# Patient Record
Sex: Female | Born: 1965 | Race: Black or African American | Hispanic: No | Marital: Single | Smoking: Former smoker
Health system: Southern US, Community
[De-identification: ages and names within clinical notes are randomized; demographics above are authoritative.]

## PROBLEM LIST (undated history)

## (undated) DIAGNOSIS — G7000 Myasthenia gravis without (acute) exacerbation: Secondary | ICD-10-CM

## (undated) DIAGNOSIS — R011 Cardiac murmur, unspecified: Secondary | ICD-10-CM

## (undated) DIAGNOSIS — F32A Depression, unspecified: Secondary | ICD-10-CM

## (undated) DIAGNOSIS — E669 Obesity, unspecified: Secondary | ICD-10-CM

## (undated) DIAGNOSIS — F329 Major depressive disorder, single episode, unspecified: Secondary | ICD-10-CM

## (undated) DIAGNOSIS — I2699 Other pulmonary embolism without acute cor pulmonale: Secondary | ICD-10-CM

## (undated) DIAGNOSIS — E079 Disorder of thyroid, unspecified: Secondary | ICD-10-CM

## (undated) DIAGNOSIS — G4730 Sleep apnea, unspecified: Secondary | ICD-10-CM

## (undated) DIAGNOSIS — J45909 Unspecified asthma, uncomplicated: Secondary | ICD-10-CM

## (undated) HISTORY — PX: THYROID SURGERY: SHX805

## (undated) HISTORY — PX: TUBAL LIGATION: SHX77

## (undated) HISTORY — PX: TONSILLECTOMY: SUR1361

---
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
## 1992-09-10 DIAGNOSIS — G7000 Myasthenia gravis without (acute) exacerbation: Secondary | ICD-10-CM | POA: Diagnosis present

## 1999-12-31 DIAGNOSIS — E039 Hypothyroidism, unspecified: Secondary | ICD-10-CM | POA: Insufficient documentation

## 2000-03-11 ENCOUNTER — Encounter: Admission: RE | Admit: 2000-03-11 | Discharge: 2000-03-11 | Payer: Self-pay | Admitting: *Deleted

## 2000-03-11 ENCOUNTER — Encounter: Payer: Self-pay | Admitting: *Deleted

## 2000-03-14 ENCOUNTER — Encounter: Payer: Self-pay | Admitting: Emergency Medicine

## 2000-03-14 ENCOUNTER — Emergency Department (HOSPITAL_COMMUNITY): Admission: EM | Admit: 2000-03-14 | Discharge: 2000-03-14 | Payer: Self-pay | Admitting: Emergency Medicine

## 2000-03-29 ENCOUNTER — Encounter: Admission: RE | Admit: 2000-03-29 | Discharge: 2000-04-12 | Payer: Self-pay | Admitting: Orthopedic Surgery

## 2000-04-21 ENCOUNTER — Encounter: Payer: Self-pay | Admitting: Emergency Medicine

## 2000-04-21 ENCOUNTER — Emergency Department (HOSPITAL_COMMUNITY): Admission: EM | Admit: 2000-04-21 | Discharge: 2000-04-21 | Payer: Self-pay | Admitting: Emergency Medicine

## 2000-08-13 ENCOUNTER — Emergency Department (HOSPITAL_COMMUNITY): Admission: EM | Admit: 2000-08-13 | Discharge: 2000-08-13 | Payer: Self-pay | Admitting: Emergency Medicine

## 2000-08-31 ENCOUNTER — Emergency Department (HOSPITAL_COMMUNITY): Admission: EM | Admit: 2000-08-31 | Discharge: 2000-08-31 | Payer: Self-pay | Admitting: Emergency Medicine

## 2002-06-22 ENCOUNTER — Other Ambulatory Visit: Admission: RE | Admit: 2002-06-22 | Discharge: 2002-06-22 | Payer: Self-pay | Admitting: Obstetrics and Gynecology

## 2003-03-30 ENCOUNTER — Emergency Department (HOSPITAL_COMMUNITY): Admission: EM | Admit: 2003-03-30 | Discharge: 2003-03-30 | Payer: Self-pay | Admitting: Emergency Medicine

## 2003-08-08 ENCOUNTER — Other Ambulatory Visit: Admission: RE | Admit: 2003-08-08 | Discharge: 2003-08-08 | Payer: Self-pay | Admitting: Obstetrics and Gynecology

## 2003-10-02 ENCOUNTER — Emergency Department (HOSPITAL_COMMUNITY): Admission: EM | Admit: 2003-10-02 | Discharge: 2003-10-02 | Payer: Self-pay | Admitting: Emergency Medicine

## 2004-06-04 ENCOUNTER — Emergency Department (HOSPITAL_COMMUNITY): Admission: EM | Admit: 2004-06-04 | Discharge: 2004-06-04 | Payer: Self-pay | Admitting: Family Medicine

## 2004-09-04 ENCOUNTER — Emergency Department (HOSPITAL_COMMUNITY): Admission: EM | Admit: 2004-09-04 | Discharge: 2004-09-04 | Payer: Self-pay | Admitting: Family Medicine

## 2004-09-06 ENCOUNTER — Emergency Department (HOSPITAL_COMMUNITY): Admission: EM | Admit: 2004-09-06 | Discharge: 2004-09-06 | Payer: Self-pay | Admitting: Emergency Medicine

## 2004-10-08 ENCOUNTER — Other Ambulatory Visit: Admission: RE | Admit: 2004-10-08 | Discharge: 2004-10-08 | Payer: Self-pay | Admitting: Obstetrics and Gynecology

## 2004-11-21 ENCOUNTER — Emergency Department (HOSPITAL_COMMUNITY): Admission: EM | Admit: 2004-11-21 | Discharge: 2004-11-21 | Payer: Self-pay | Admitting: Family Medicine

## 2004-12-18 ENCOUNTER — Emergency Department (HOSPITAL_COMMUNITY): Admission: EM | Admit: 2004-12-18 | Discharge: 2004-12-18 | Payer: Self-pay | Admitting: Emergency Medicine

## 2005-02-13 ENCOUNTER — Emergency Department (HOSPITAL_COMMUNITY): Admission: EM | Admit: 2005-02-13 | Discharge: 2005-02-13 | Payer: Self-pay | Admitting: Emergency Medicine

## 2005-10-22 ENCOUNTER — Other Ambulatory Visit: Admission: RE | Admit: 2005-10-22 | Discharge: 2005-10-22 | Payer: Self-pay | Admitting: Obstetrics and Gynecology

## 2005-12-07 ENCOUNTER — Ambulatory Visit (HOSPITAL_COMMUNITY): Admission: RE | Admit: 2005-12-07 | Discharge: 2005-12-07 | Payer: Self-pay | Admitting: Obstetrics and Gynecology

## 2005-12-07 ENCOUNTER — Encounter (INDEPENDENT_AMBULATORY_CARE_PROVIDER_SITE_OTHER): Payer: Self-pay | Admitting: *Deleted

## 2006-03-03 ENCOUNTER — Emergency Department (HOSPITAL_COMMUNITY): Admission: EM | Admit: 2006-03-03 | Discharge: 2006-03-03 | Payer: Self-pay | Admitting: Family Medicine

## 2007-06-17 ENCOUNTER — Emergency Department (HOSPITAL_COMMUNITY): Admission: EM | Admit: 2007-06-17 | Discharge: 2007-06-17 | Payer: Self-pay | Admitting: Family Medicine

## 2009-01-06 ENCOUNTER — Emergency Department (HOSPITAL_COMMUNITY): Admission: EM | Admit: 2009-01-06 | Discharge: 2009-01-06 | Payer: Self-pay | Admitting: Emergency Medicine

## 2010-06-06 NOTE — Op Note (Signed)
NAME:  [name], [name]            ACCOUNT NO.:  [id number]   MEDICAL RECORD NO.:  [id number]          PATIENT TYPE:  AMB   LOCATION:  SDC                           FACILITY:  [facility]   PHYSICIAN:  James A. Ashley Royalty, M.D.DATE OF BIRTH:  16-May-1965   DATE OF PROCEDURE:  12/07/2005  DATE OF DISCHARGE:                               OPERATIVE REPORT   PREOPERATIVE DIAGNOSES:  1. Fibroid uterus.  2. Abnormal bleeding.   POSTOPERATIVE DIAGNOSES:  1. Fibroid uterus.  2. Abnormal bleeding.  3. Pathology pending.   PROCEDURE:  1. Diagnostic hysteroscopy.  2. Dilatation & curettage.   SURGEON:  Rudy Jew. Ashley Royalty, MD   ANESTHESIA:  General.   ESTIMATED BLOOD LOSS:  Less than 50 mL.   COMPLICATIONS:  None.   PACKS AND DRAINS:  None.   PROCEDURE:  The patient was taken to the operating room and placed in a  dorsal supine position.  After general anesthetic was administered, she  was placed in lithotomy position and prepped and draped in the usual  manner for vaginal surgery.  Posterior weighted retractor was placed per  vagina.  The anterior lip of the cervix was grasped with a single-tooth  tenaculum.  The uterus was gently sounded to approximately 9 cm and  noted to be anteverted.  The cervix was then dilated to a size 27-French  using Shawnie Pons dilators.  The resectoscope was placed using sorbitol as a  distension medium.  Endometrial cavity was visualized in its entirety.  The tubal ostia were noted bilaterally.  Careful evaluation of the  cavity revealed no obvious submucosal fibroids or polyps.  Endocervical  view was similarly benign.  Appropriate photos were obtained.   Attention was then turned to the uterine curettage.  A medium size  curette was introduced into the endometrial cavity, and the 4 quadrant  technique with curettage was initiated.  Then, a therapeutic technique  was used.  All curettings were submitted to pathology for histologic  studies.   At this point, the  patient was felt to have benefited maximally from the  surgical procedure.  Instruments were removed.  Hemostasis was noted and  the procedure terminated.   The patient tolerated the procedure extremely well and was returned to  the recovery room in good condition.      James A. Ashley Royalty, M.D.  Electronically Signed     JAM/MEDQ  D:  12/07/2005  T:  12/07/2005  Job:  [id number]

## 2010-06-06 NOTE — H&P (Signed)
NAME:  [name], [name]            ACCOUNT NO.:  [id number]   MEDICAL RECORD NO.:  [id number]          PATIENT TYPE:  AMB   LOCATION:  SDC                           FACILITY:  [facility]   PHYSICIAN:  James A. Ashley Royalty, M.D.DATE OF BIRTH:  20-Mar-1965   DATE OF ADMISSION:  DATE OF DISCHARGE:                              HISTORY & PHYSICAL   Note:  Please have this dictation available in the outpatient surgical  area in [facility] at 1:00 p.m.   This is a 44 year old gravida 1, para 0, AB 1, who presented to me  October 2007 complaining of metrorrhagia, including clots and severe  dysmenorrhea.  Periods were reported as approximately every 28 days.   ASSESSMENT:  1. Ultrasound was performed in the office and revealed the uterus to      be 11 x 8 x 6 cm with numerous fibroids present.  The largest was      4.4 cm in greatest diameter.  The adnexa were essentially      unremarkable.  The patient was henceforth diagnosed with      laparoscopic hysteroscopy and dilatation and curettage.   MEDICATIONS:  1. Mestinon for myasthenia.  2. Paxil 20 mg daily.  3. Axid 150 mg b.i.d.  4. Cellcept 500 mg for myasthenia.  5. Levothyroxine 150 mcg daily.   PAST MEDICAL HISTORY:  Medical:  1. Myasthenia gravis.  2. Depression.  3. Hyperthyroidism.   SURGICAL HISTORY:  Tubal sterilization procedure 1997, tonsillectomy  1990 thyroid surgery - 2000.   ALLERGIES:  NONE.   FAMILY HISTORY:  Positive for diabetes.   SOCIAL HISTORY:  Patient is a smoker.  She denies significant alcohol  use.   REVIEW OF SYSTEMS:  Noncontributory.   PHYSICAL EXAMINATION:  GENERAL:  Well-developed, well-nourished,  pleasant black female, no acute distress, afebrile.  VITAL SIGNS:  Stable.  HEENT:  Normocephalic.  NECK:  Supple without masses.  CHEST:  Lungs are clear.  CARDIAC:  Regular rate and rhythm.  ABDOMEN:  Soft and nontender without palpable masses.  MUSCULOSKELETAL:  No CVA tenderness.  PELVIC:   Please see most recent pelvic examination.  GENITALIA:  Within normal limits.  Vagina and cervix are without masses  or lesions.  Bi-manual examination reveals a multi-nodular uterus  consistent with fibroids.  No adnexal masses palpable.   IMPRESSION:  1. Fibroid uterus.  2. Metrorrhagia and severe dysmenorrhea - probably secondary to #1.  3. Myasthenia gravis.  4. Smoker.  5. Hypothyroidism.  6. History of depression.   PLAN:  1. Diagnostic laparoscopic hysteroscopy.  2. Dilatation and curettage.   Risks, benefits, complications and alternatives were discussed with the  patient.  She states she understands and accepts.  Questions were  invited and answered.      James A. Ashley Royalty, M.D.  Electronically Signed     JAM/MEDQ  D:  12/07/2005  T:  12/07/2005  Job:  [id number]

## 2010-07-28 ENCOUNTER — Emergency Department (HOSPITAL_COMMUNITY): Payer: Medicare Other

## 2010-07-28 ENCOUNTER — Emergency Department (HOSPITAL_COMMUNITY)
Admission: EM | Admit: 2010-07-28 | Discharge: 2010-07-29 | Disposition: A | Discharge status: Home / Self Care | Payer: Medicare Other | Attending: Emergency Medicine | Admitting: Emergency Medicine

## 2010-07-28 DIAGNOSIS — R0602 Shortness of breath: Secondary | ICD-10-CM | POA: Insufficient documentation

## 2010-07-28 DIAGNOSIS — K219 Gastro-esophageal reflux disease without esophagitis: Secondary | ICD-10-CM | POA: Insufficient documentation

## 2010-07-28 DIAGNOSIS — R5383 Other fatigue: Secondary | ICD-10-CM | POA: Insufficient documentation

## 2010-07-28 DIAGNOSIS — E860 Dehydration: Secondary | ICD-10-CM | POA: Insufficient documentation

## 2010-07-28 DIAGNOSIS — R5381 Other malaise: Secondary | ICD-10-CM | POA: Insufficient documentation

## 2010-07-28 DIAGNOSIS — R42 Dizziness and giddiness: Secondary | ICD-10-CM | POA: Insufficient documentation

## 2010-07-28 DIAGNOSIS — Z79899 Other long term (current) drug therapy: Secondary | ICD-10-CM | POA: Insufficient documentation

## 2010-07-28 DIAGNOSIS — E039 Hypothyroidism, unspecified: Secondary | ICD-10-CM | POA: Insufficient documentation

## 2010-07-28 DIAGNOSIS — H519 Unspecified disorder of binocular movement: Secondary | ICD-10-CM | POA: Insufficient documentation

## 2010-07-28 DIAGNOSIS — R110 Nausea: Secondary | ICD-10-CM | POA: Insufficient documentation

## 2010-07-28 LAB — DIFFERENTIAL
Lymphocytes Relative: 30 % (ref 12–46)
Monocytes Absolute: 0.8 10*3/uL (ref 0.1–1.0)
Monocytes Relative: 7 % (ref 3–12)
Neutro Abs: 6.4 10*3/uL (ref 1.7–7.7)

## 2010-07-28 LAB — CBC
HCT: 36.4 % (ref 36.0–46.0)
Hemoglobin: 12 g/dL (ref 12.0–15.0)
MCH: 26.7 pg (ref 26.0–34.0)
MCHC: 33 g/dL (ref 30.0–36.0)

## 2010-07-29 ENCOUNTER — Ambulatory Visit (HOSPITAL_COMMUNITY)
Admission: RE | Admit: 2010-07-29 | Discharge: 2010-07-29 | Disposition: A | Discharge status: Home / Self Care | Payer: Medicare Other | Source: Ambulatory Visit | Attending: Emergency Medicine | Admitting: Emergency Medicine

## 2010-07-29 DIAGNOSIS — R0602 Shortness of breath: Secondary | ICD-10-CM

## 2010-07-29 LAB — BASIC METABOLIC PANEL
Chloride: 102 mEq/L (ref 96–112)
Creatinine, Ser: 0.54 mg/dL (ref 0.50–1.10)
GFR calc Af Amer: >60 mL/min (ref >60)
Potassium: 4.2 mEq/L (ref 3.5–5.1)

## 2010-07-29 LAB — URINALYSIS, ROUTINE W REFLEX MICROSCOPIC
Ketones, ur: NEGATIVE mg/dL
Leukocytes, UA: NEGATIVE
Nitrite: NEGATIVE
Specific Gravity, Urine: 1.016 (ref 1.005–1.030)
pH: 5.5 (ref 5.0–8.0)

## 2010-07-29 LAB — PRO B NATRIURETIC PEPTIDE: Pro B Natriuretic peptide (BNP): 25.2 pg/mL (ref 0–125)

## 2010-07-29 MED ORDER — IOHEXOL 300 MG/ML  SOLN
100.0000 mL | Freq: Once | INTRAMUSCULAR | Status: AC | PRN
  Administered 2010-07-29: 100 mL via INTRAVENOUS

## 2010-08-04 LAB — GLUCOSE, CAPILLARY: Glucose-Capillary: 122 mg/dL — ABNORMAL HIGH (ref 70–99)

## 2010-10-15 LAB — HERPES SIMPLEX VIRUS CULTURE

## 2010-10-19 ENCOUNTER — Inpatient Hospital Stay (INDEPENDENT_AMBULATORY_CARE_PROVIDER_SITE_OTHER)
Admission: RE | Admit: 2010-10-19 | Discharge: 2010-10-19 | Disposition: A | Discharge status: Home / Self Care | Payer: Medicaid Other | Source: Ambulatory Visit | Attending: Emergency Medicine | Admitting: Emergency Medicine

## 2010-10-19 DIAGNOSIS — J45909 Unspecified asthma, uncomplicated: Secondary | ICD-10-CM

## 2011-08-05 ENCOUNTER — Emergency Department (HOSPITAL_COMMUNITY)
Admission: EM | Admit: 2011-08-05 | Discharge: 2011-08-05 | Disposition: A | Discharge status: Home / Self Care | Payer: Medicare Other | Attending: Emergency Medicine | Admitting: Emergency Medicine

## 2011-08-05 ENCOUNTER — Encounter (HOSPITAL_COMMUNITY): Payer: Self-pay | Admitting: *Deleted

## 2011-08-05 ENCOUNTER — Emergency Department (HOSPITAL_COMMUNITY): Payer: Medicare Other

## 2011-08-05 DIAGNOSIS — R071 Chest pain on breathing: Secondary | ICD-10-CM | POA: Insufficient documentation

## 2011-08-05 DIAGNOSIS — J45909 Unspecified asthma, uncomplicated: Secondary | ICD-10-CM | POA: Insufficient documentation

## 2011-08-05 DIAGNOSIS — R0789 Other chest pain: Secondary | ICD-10-CM

## 2011-08-05 DIAGNOSIS — F17200 Nicotine dependence, unspecified, uncomplicated: Secondary | ICD-10-CM | POA: Insufficient documentation

## 2011-08-05 DIAGNOSIS — J40 Bronchitis, not specified as acute or chronic: Secondary | ICD-10-CM | POA: Insufficient documentation

## 2011-08-05 HISTORY — DX: Unspecified asthma, uncomplicated: J45.909

## 2011-08-05 HISTORY — DX: Myasthenia gravis without (acute) exacerbation: G70.00

## 2011-08-05 MED ORDER — IBUPROFEN 800 MG PO TABS
800.0000 mg | ORAL_TABLET | Freq: Once | ORAL | Status: AC
  Administered 2011-08-05: 800 mg via ORAL
  Filled 2011-08-05: qty 1

## 2011-08-05 MED ORDER — IBUPROFEN 800 MG PO TABS: 800.0000 mg | ORAL_TABLET | Freq: Three times a day (TID) | ORAL | Status: DC

## 2011-08-05 MED ORDER — ALBUTEROL SULFATE HFA 108 (90 BASE) MCG/ACT IN AERS: 2.0000 | INHALATION_SPRAY | RESPIRATORY_TRACT | Status: DC | PRN

## 2011-08-05 MED ORDER — HYDROCOD POLST-CHLORPHEN POLST 10-8 MG/5ML PO LQCR: 5.0000 mL | Freq: Two times a day (BID) | ORAL | Status: DC | PRN

## 2011-08-05 MED ORDER — ALBUTEROL SULFATE (5 MG/ML) 0.5% IN NEBU
2.5000 mg | INHALATION_SOLUTION | Freq: Once | RESPIRATORY_TRACT | Status: AC
  Administered 2011-08-05: 2.5 mg via RESPIRATORY_TRACT
  Filled 2011-08-05: qty 1

## 2011-08-05 MED ORDER — IBUPROFEN 800 MG PO TABS: 800.0000 mg | ORAL_TABLET | Freq: Three times a day (TID) | ORAL | Status: AC

## 2011-08-05 MED ORDER — HYDROCOD POLST-CHLORPHEN POLST 10-8 MG/5ML PO LQCR
5.0000 mL | Freq: Once | ORAL | Status: AC
  Administered 2011-08-05: 5 mL via ORAL
  Filled 2011-08-05: qty 5

## 2011-08-05 NOTE — ED Notes (Signed)
The pt has had mi and rt upper chest pain since yesterday with sob.  No previous history

## 2011-08-05 NOTE — ED Notes (Signed)
Pt discharged home in improved condition with friend.

## 2011-08-05 NOTE — ED Provider Notes (Signed)
History     CSN: [id number]  Arrival date & time 08/05/11  4098   First MD Initiated Contact with Patient 08/05/11 0325      Chief Complaint  Patient presents with  . Chest Pain    (Consider location/radiation/quality/duration/timing/severity/associated sxs/prior treatment) HPI 46 year old female presents to emergency department complaining of chest pain cough and increased sputum production. Patient reports onset of cough Sunday. Cough is caused worsening pain in her chest. Patient reports at times she has coughed until she is gag and throw not. Patient has history of asthma, myasthenia gravis, morbid obesity.  Pt smokes 1ppd.  No prior h/o COPD/bronchitis/emphysema.  Patient reports chest pain is worse with palpation, deep breaths, or coughing. She denies any leg swelling. She denies any use of OCPs, prolonged immobility, or surgical procedures. No prior history of PE or DVT in herself or family. Patient denies any cardiac history in herself or family. No DOE, no PND.   Past Medical History  Diagnosis Date  . Asthma   . Myasthenia gravis     History reviewed. No pertinent past surgical history.  No family history on file.  History  Substance Use Topics  . Smoking status: Current Everyday Smoker  . Smokeless tobacco: Not on file  . Alcohol Use: Yes    OB History    Grav Para Term Preterm Abortions TAB SAB Ect Mult Living                  Review of Systems  All other systems reviewed and are negative.    Allergies  Review of patient's allergies indicates not on file.  Home Medications  No current outpatient prescriptions on file.  BP 127/80  Pulse 93  Temp 99.3 F (37.4 C) (Oral)  Resp 22  SpO2 97%  Physical Exam  Nursing note and vitals reviewed. Constitutional: She is oriented to person, place, and time. She appears well-developed and well-nourished.       Morbid obesity noted  HENT:  Head: Normocephalic and atraumatic.  Nose: Nose normal.    Mouth/Throat: Oropharynx is clear and moist.  Neck: Normal range of motion. Neck supple. No JVD present. No tracheal deviation present. No thyromegaly present.  Cardiovascular: Normal rate, regular rhythm, normal heart sounds and intact distal pulses.  Exam reveals no gallop and no friction rub.   No murmur heard. Pulmonary/Chest: Effort normal. No stridor. No respiratory distress. She has wheezes (faint end expiratory wheeze). She has no rales. She exhibits tenderness (tenderness to palpation over her sternum and left anterior chest. Palpation of chest reproduces pain).       Patient with persistent cough  Abdominal: Soft. Bowel sounds are normal. She exhibits no distension and no mass. There is no tenderness. There is no rebound and no guarding.  Musculoskeletal: Normal range of motion. She exhibits no edema and no tenderness.  Lymphadenopathy:    She has no cervical adenopathy.  Neurological: She is oriented to person, place, and time. She exhibits normal muscle tone. Coordination normal.  Skin: Skin is dry. No rash noted. No erythema. No pallor.  Psychiatric: She has a normal mood and affect. Her behavior is normal. Judgment and thought content normal.    ED Course  Procedures (including critical care time)  Labs Reviewed - No data to display Dg Chest 2 View  08/05/2011  *RADIOLOGY REPORT*  Clinical Data: Chest pain.  CHEST - 2 VIEW  Comparison: 07/28/2010  Findings: The stable appearance of postoperative changes in the mediastinum.  Shallow inspiration.  Mild cardiac enlargement and pulmonary vascular congestion.  No edema or consolidation.  Linear fibrosis in the left lung base is stable.  No blunting of costophrenic angles.  No pneumothorax.  No significant change since previous study.  IMPRESSION: Mild cardiac enlargement and pulmonary vascular congestion similar previous study.  Original Report Authenticated By: Marlon Pel, M.D.    Date: 08/05/2011  Rate: 86  Rhythm: normal  sinus rhythm  QRS Axis: normal  Intervals: normal  ST/T Wave abnormalities: normal  Conduction Disutrbances:none  Narrative Interpretation:   Old EKG Reviewed: unchanged     1. Chest wall pain   2. Bronchitis       MDM  46 year old female with cough and chest wall pain. EKG is normal, chest x-ray without signs of infiltrate, patient PERC negative.  Of note, triage note reports patient has had MI and right upper chest pain since yesterday with shortness of breath. Patient does not have history of MI, and has left upper chest pain. Suspect bronchitis with chest wall pain secondary to coughing. Patient has a primary care Dr. Stann Mainland give breathing treatment here start on cough suppressants and have her followup with her primary care Dr. No steroids at this time given history of myasthenia gravis.        Olivia Mackie, MD 08/05/11 [phone]

## 2012-05-22 ENCOUNTER — Inpatient Hospital Stay (HOSPITAL_COMMUNITY)
Admission: AD | Admit: 2012-05-22 | Discharge: 2012-05-22 | Disposition: A | Discharge status: Home / Self Care | Payer: Medicare Other | Source: Ambulatory Visit | Attending: Obstetrics and Gynecology | Admitting: Obstetrics and Gynecology

## 2012-05-22 DIAGNOSIS — R109 Unspecified abdominal pain: Secondary | ICD-10-CM | POA: Insufficient documentation

## 2012-05-22 DIAGNOSIS — K5289 Other specified noninfective gastroenteritis and colitis: Secondary | ICD-10-CM | POA: Insufficient documentation

## 2012-05-22 DIAGNOSIS — R197 Diarrhea, unspecified: Secondary | ICD-10-CM | POA: Insufficient documentation

## 2012-05-22 DIAGNOSIS — R112 Nausea with vomiting, unspecified: Secondary | ICD-10-CM | POA: Insufficient documentation

## 2012-05-22 DIAGNOSIS — K921 Melena: Secondary | ICD-10-CM

## 2012-05-22 DIAGNOSIS — K529 Noninfective gastroenteritis and colitis, unspecified: Secondary | ICD-10-CM

## 2012-05-22 LAB — CBC
HCT: 38.8 % (ref 36.0–46.0)
MCH: 28.2 pg (ref 26.0–34.0)
MCHC: 32.2 g/dL (ref 30.0–36.0)
MCV: 87.6 fL (ref 78.0–100.0)
RDW: 15.6 % — ABNORMAL HIGH (ref 11.5–15.5)
WBC: 9.9 10*3/uL (ref 4.0–10.5)

## 2012-05-22 LAB — URINALYSIS, ROUTINE W REFLEX MICROSCOPIC
Bilirubin Urine: NEGATIVE
Leukocytes, UA: NEGATIVE
Nitrite: NEGATIVE
Specific Gravity, Urine: >1.03 — ABNORMAL HIGH (ref 1.005–1.030)
pH: 5.5 (ref 5.0–8.0)

## 2012-05-22 LAB — URINE MICROSCOPIC-ADD ON

## 2012-05-22 LAB — POCT PREGNANCY, URINE: Preg Test, Ur: NEGATIVE

## 2012-05-22 MED ORDER — ONDANSETRON 8 MG PO TBDP
8.0000 mg | ORAL_TABLET | Freq: Once | ORAL | Status: AC
  Administered 2012-05-22: 8 mg via ORAL
  Filled 2012-05-22: qty 1

## 2012-05-22 MED ORDER — PROMETHAZINE HCL 25 MG PO TABS: 25.0000 mg | ORAL_TABLET | Freq: Four times a day (QID) | ORAL | Status: DC | PRN

## 2012-05-22 MED ORDER — DIPHENOXYLATE-ATROPINE 2.5-0.025 MG PO TABS: 1.0000 | ORAL_TABLET | Freq: Four times a day (QID) | ORAL | Status: DC | PRN

## 2012-05-22 MED ORDER — SIMETHICONE 80 MG PO CHEW
160.0000 mg | CHEWABLE_TABLET | Freq: Once | ORAL | Status: AC
  Administered 2012-05-22: 160 mg via ORAL
  Filled 2012-05-22: qty 2

## 2012-05-22 NOTE — MAU Note (Addendum)
Patient presents to MAU with c/o rectal bleeding with BMs x 3 days. Reports about 4 occurences of diarrhea per day since that time.  Denies changes in diet or medications; denies antecedent sexual activity or trauma. Denies cocaine use or ETOH use. Reports having taken one tablet of Aleve yesterday.  Reports hx of myasthenia gravida, hypothyroidism.

## 2012-05-22 NOTE — MAU Note (Signed)
Pt reports she has been having abd cramping and having blood in he stool x 3 days.

## 2012-05-22 NOTE — MAU Provider Note (Signed)
History     CSN: [id number]  Arrival date and time: 05/22/12 1111   First Provider Initiated Contact with Patient 05/22/12 1210      Chief Complaint  Patient presents with  . Rectal Bleeding   HPI 47 y.o. female with n/v/d x 3 days, blood in stool since onset of diarrhea. No fever. + abdominal pain.   Past Medical History  Diagnosis Date  . Asthma   . Myasthenia gravis     No past surgical history on file.  No family history on file.  History  Substance Use Topics  . Smoking status: Current Every Day Smoker  . Smokeless tobacco: Not on file  . Alcohol Use: Yes    Allergies: No Known Allergies  Prescriptions prior to admission  Medication Sig Dispense Refill  . albuterol (PROVENTIL HFA;VENTOLIN HFA) 108 (90 BASE) MCG/ACT inhaler Inhale 2 puffs into the lungs every 6 (six) hours as needed.      Marland Kitchen levothyroxine (SYNTHROID, LEVOTHROID) 200 MCG tablet Take 200 mcg by mouth daily before breakfast.      . PARoxetine (PAXIL) 20 MG tablet Take 20 mg by mouth daily.      Marland Kitchen PARoxetine (PAXIL) 30 MG tablet Take 30 mg by mouth daily.        Review of Systems  Constitutional: Negative.  Negative for fever and chills.  Respiratory: Negative.   Cardiovascular: Negative.   Gastrointestinal: Positive for nausea, vomiting, abdominal pain, diarrhea and blood in stool. Negative for constipation.  Genitourinary: Negative for dysuria, urgency, frequency, hematuria and flank pain.       Negative for vaginal bleeding, vaginal discharge  Musculoskeletal: Negative.   Neurological: Negative.   Psychiatric/Behavioral: Negative.    Physical Exam   Blood pressure 135/67, pulse 79, temperature 98.3 F (36.8 C), temperature source Oral, resp. rate 18, height 5' 0.5" (1.537 m), weight 305 lb 6.4 oz (138.529 kg).  Physical Exam  Nursing note and vitals reviewed. Constitutional: She is oriented to person, place, and time. She appears well-developed and well-nourished. She appears distressed.   Cardiovascular: Normal rate.   Respiratory: Effort normal.  GI: Soft. There is no tenderness.  Genitourinary: Rectal exam shows external hemorrhoid. Rectal exam shows no internal hemorrhoid, no fissure, no mass, no tenderness and anal tone normal.  Musculoskeletal: Normal range of motion.  Neurological: She is alert and oriented to person, place, and time.  Skin: Skin is warm and dry.  Psychiatric: She has a normal mood and affect.    MAU Course  Procedures  Results for orders placed during the hospital encounter of 05/22/12 (from the past 24 hour(s))  URINALYSIS, ROUTINE W REFLEX MICROSCOPIC     Status: Abnormal   Collection Time    05/22/12 11:27 AM      Result Value Range   Color, Urine YELLOW  YELLOW   APPearance CLEAR  CLEAR   Specific Gravity, Urine >1.030 (*) 1.005 - 1.030   pH 5.5  5.0 - 8.0   Glucose, UA NEGATIVE  NEGATIVE mg/dL   Hgb urine dipstick NEGATIVE  NEGATIVE   Bilirubin Urine NEGATIVE  NEGATIVE   Ketones, ur NEGATIVE  NEGATIVE mg/dL   Protein, ur 30 (*) NEGATIVE mg/dL   Urobilinogen, UA 0.2  0.0 - 1.0 mg/dL   Nitrite NEGATIVE  NEGATIVE   Leukocytes, UA NEGATIVE  NEGATIVE  URINE MICROSCOPIC-ADD ON     Status: Abnormal   Collection Time    05/22/12 11:27 AM      Result Value Range  Squamous Epithelial / LPF FEW (*) RARE   WBC, UA 0-2  <3 WBC/hpf   RBC / HPF 0-2  <3 RBC/hpf   Bacteria, UA FEW (*) RARE  POCT PREGNANCY, URINE     Status: None   Collection Time    05/22/12 11:32 AM      Result Value Range   Preg Test, Ur NEGATIVE  NEGATIVE  CBC     Status: Abnormal   Collection Time    05/22/12 12:08 PM      Result Value Range   WBC 9.9  4.0 - 10.5 K/uL   RBC 4.43  3.87 - 5.11 MIL/uL   Hemoglobin 12.5  12.0 - 15.0 g/dL   HCT 40.9  81.1 - 91.4 %   MCV 87.6  78.0 - 100.0 fL   MCH 28.2  26.0 - 34.0 pg   MCHC 32.2  30.0 - 36.0 g/dL   RDW 78.2 (*) 95.6 - 21.3 %   Platelets 199  150 - 400 K/uL   Zofran ODT 8 mg and Simethicone 160 mg given in MAU  with relief of abdominal pain and nausea.   Assessment and Plan   1. Acute gastroenteritis   2. Blood in stool   Pt appears stable with no active bleeding at this time, pain and nausea relieved with Zofran and Simethicone. Pt to follow up with PCP this week or go to [facility] if symptoms worsen.     Medication List    TAKE these medications       albuterol 108 (90 BASE) MCG/ACT inhaler  Commonly known as:  PROVENTIL HFA;VENTOLIN HFA  Inhale 2 puffs into the lungs every 6 (six) hours as needed.     diphenoxylate-atropine 2.5-0.025 MG per tablet  Commonly known as:  LOMOTIL  Take 1 tablet by mouth 4 (four) times daily as needed for diarrhea or loose stools.     levothyroxine 200 MCG tablet  Commonly known as:  SYNTHROID, LEVOTHROID  Take 200 mcg by mouth daily before breakfast.     PARoxetine 20 MG tablet  Commonly known as:  PAXIL  Take 20 mg by mouth daily.     PARoxetine 30 MG tablet  Commonly known as:  PAXIL  Take 30 mg by mouth daily.     promethazine 25 MG tablet  Commonly known as:  PHENERGAN  Take 1 tablet (25 mg total) by mouth every 6 (six) hours as needed for nausea.            Follow-up Information   Follow up with your doctor. Call on 05/23/2012.        FRAZIER,NATALIE 05/22/2012, 1:35 PM

## 2012-05-22 NOTE — MAU Provider Note (Signed)
Attestation of Attending Supervision of Advanced Practitioner: Evaluation and management procedures were performed by the PA/NP/CNM/OB Fellow under my supervision/collaboration. Chart reviewed and agree with management and plan.  Rylyn Ranganathan V 05/22/2012 4:12 PM    

## 2012-12-28 DIAGNOSIS — K219 Gastro-esophageal reflux disease without esophagitis: Secondary | ICD-10-CM | POA: Diagnosis present

## 2014-06-20 DIAGNOSIS — F419 Anxiety disorder, unspecified: Secondary | ICD-10-CM | POA: Diagnosis present

## 2014-06-20 DIAGNOSIS — F32A Depression, unspecified: Secondary | ICD-10-CM | POA: Diagnosis present

## 2014-06-20 DIAGNOSIS — F329 Major depressive disorder, single episode, unspecified: Secondary | ICD-10-CM | POA: Diagnosis present

## 2014-06-20 DIAGNOSIS — G4730 Sleep apnea, unspecified: Secondary | ICD-10-CM | POA: Diagnosis present

## 2016-08-15 DIAGNOSIS — F329 Major depressive disorder, single episode, unspecified: Secondary | ICD-10-CM | POA: Diagnosis present

## 2016-08-15 DIAGNOSIS — I2699 Other pulmonary embolism without acute cor pulmonale: Secondary | ICD-10-CM | POA: Diagnosis present

## 2016-08-15 DIAGNOSIS — R197 Diarrhea, unspecified: Secondary | ICD-10-CM | POA: Diagnosis not present

## 2016-08-15 DIAGNOSIS — E890 Postprocedural hypothyroidism: Secondary | ICD-10-CM | POA: Diagnosis present

## 2016-08-15 DIAGNOSIS — F419 Anxiety disorder, unspecified: Secondary | ICD-10-CM | POA: Diagnosis present

## 2016-08-15 DIAGNOSIS — G4733 Obstructive sleep apnea (adult) (pediatric): Secondary | ICD-10-CM | POA: Diagnosis present

## 2016-08-15 DIAGNOSIS — J4541 Moderate persistent asthma with (acute) exacerbation: Principal | ICD-10-CM | POA: Diagnosis present

## 2016-08-15 DIAGNOSIS — Z6841 Body Mass Index (BMI) 40.0 and over, adult: Secondary | ICD-10-CM

## 2016-08-15 DIAGNOSIS — K219 Gastro-esophageal reflux disease without esophagitis: Secondary | ICD-10-CM | POA: Diagnosis present

## 2016-08-15 DIAGNOSIS — I351 Nonrheumatic aortic (valve) insufficiency: Secondary | ICD-10-CM | POA: Diagnosis present

## 2016-08-15 DIAGNOSIS — G7000 Myasthenia gravis without (acute) exacerbation: Secondary | ICD-10-CM | POA: Diagnosis present

## 2016-08-15 DIAGNOSIS — R0602 Shortness of breath: Secondary | ICD-10-CM | POA: Diagnosis not present

## 2016-08-15 DIAGNOSIS — J9601 Acute respiratory failure with hypoxia: Secondary | ICD-10-CM | POA: Diagnosis present

## 2016-08-15 DIAGNOSIS — I5031 Acute diastolic (congestive) heart failure: Secondary | ICD-10-CM | POA: Diagnosis present

## 2016-08-15 DIAGNOSIS — I110 Hypertensive heart disease with heart failure: Secondary | ICD-10-CM | POA: Diagnosis present

## 2016-08-16 ENCOUNTER — Encounter (HOSPITAL_COMMUNITY): Payer: Self-pay | Admitting: Emergency Medicine

## 2016-08-16 ENCOUNTER — Inpatient Hospital Stay (HOSPITAL_COMMUNITY)
Admission: EM | Admit: 2016-08-16 | Discharge: 2016-08-19 | DRG: 202 | Disposition: A | Discharge status: Home / Self Care | Payer: Medicare Other | Attending: Internal Medicine | Admitting: Internal Medicine

## 2016-08-16 ENCOUNTER — Emergency Department (HOSPITAL_COMMUNITY): Payer: Medicare Other

## 2016-08-16 ENCOUNTER — Inpatient Hospital Stay (HOSPITAL_COMMUNITY): Payer: Medicare Other

## 2016-08-16 DIAGNOSIS — G7000 Myasthenia gravis without (acute) exacerbation: Secondary | ICD-10-CM | POA: Diagnosis present

## 2016-08-16 DIAGNOSIS — E890 Postprocedural hypothyroidism: Secondary | ICD-10-CM | POA: Diagnosis present

## 2016-08-16 DIAGNOSIS — J45901 Unspecified asthma with (acute) exacerbation: Secondary | ICD-10-CM

## 2016-08-16 DIAGNOSIS — F419 Anxiety disorder, unspecified: Secondary | ICD-10-CM | POA: Diagnosis present

## 2016-08-16 DIAGNOSIS — G4730 Sleep apnea, unspecified: Secondary | ICD-10-CM | POA: Diagnosis not present

## 2016-08-16 DIAGNOSIS — R197 Diarrhea, unspecified: Secondary | ICD-10-CM | POA: Diagnosis not present

## 2016-08-16 DIAGNOSIS — J9601 Acute respiratory failure with hypoxia: Secondary | ICD-10-CM | POA: Diagnosis present

## 2016-08-16 DIAGNOSIS — J4541 Moderate persistent asthma with (acute) exacerbation: Secondary | ICD-10-CM | POA: Diagnosis present

## 2016-08-16 DIAGNOSIS — F329 Major depressive disorder, single episode, unspecified: Secondary | ICD-10-CM | POA: Diagnosis present

## 2016-08-16 DIAGNOSIS — I2699 Other pulmonary embolism without acute cor pulmonale: Secondary | ICD-10-CM | POA: Diagnosis present

## 2016-08-16 DIAGNOSIS — F32A Depression, unspecified: Secondary | ICD-10-CM | POA: Diagnosis present

## 2016-08-16 DIAGNOSIS — K219 Gastro-esophageal reflux disease without esophagitis: Secondary | ICD-10-CM | POA: Diagnosis present

## 2016-08-16 DIAGNOSIS — M7989 Other specified soft tissue disorders: Secondary | ICD-10-CM

## 2016-08-16 DIAGNOSIS — I110 Hypertensive heart disease with heart failure: Secondary | ICD-10-CM | POA: Diagnosis present

## 2016-08-16 DIAGNOSIS — R0602 Shortness of breath: Secondary | ICD-10-CM

## 2016-08-16 DIAGNOSIS — G4733 Obstructive sleep apnea (adult) (pediatric): Secondary | ICD-10-CM | POA: Diagnosis present

## 2016-08-16 DIAGNOSIS — Z6841 Body Mass Index (BMI) 40.0 and over, adult: Secondary | ICD-10-CM | POA: Diagnosis not present

## 2016-08-16 DIAGNOSIS — I5031 Acute diastolic (congestive) heart failure: Secondary | ICD-10-CM | POA: Diagnosis present

## 2016-08-16 DIAGNOSIS — I351 Nonrheumatic aortic (valve) insufficiency: Secondary | ICD-10-CM | POA: Diagnosis present

## 2016-08-16 LAB — BASIC METABOLIC PANEL
Anion gap: 6 (ref 5–15)
BUN: 5 mg/dL — AB (ref 6–20)
CALCIUM: 8.8 mg/dL — AB (ref 8.9–10.3)
CO2: 31 mmol/L (ref 22–32)
CREATININE: 0.67 mg/dL (ref 0.44–1.00)
Chloride: 104 mmol/L (ref 101–111)
Glucose, Bld: 110 mg/dL — ABNORMAL HIGH (ref 65–99)
Potassium: 3.5 mmol/L (ref 3.5–5.1)
SODIUM: 141 mmol/L (ref 135–145)

## 2016-08-16 LAB — ANTITHROMBIN III: ANTITHROMB III FUNC: 112 % (ref 75–120)

## 2016-08-16 LAB — GLUCOSE, CAPILLARY
GLUCOSE-CAPILLARY: 178 mg/dL — AB (ref 65–99)
GLUCOSE-CAPILLARY: 170 mg/dL — AB (ref 65–99)
Glucose-Capillary: 137 mg/dL — ABNORMAL HIGH (ref 65–99)
Glucose-Capillary: 139 mg/dL — ABNORMAL HIGH (ref 65–99)
Glucose-Capillary: 150 mg/dL — ABNORMAL HIGH (ref 65–99)

## 2016-08-16 LAB — BRAIN NATRIURETIC PEPTIDE: B NATRIURETIC PEPTIDE 5: 91.2 pg/mL (ref 0.0–100.0)

## 2016-08-16 LAB — CBC WITH DIFFERENTIAL/PLATELET
BASOS PCT: 0 %
Basophils Absolute: 0 10*3/uL (ref 0.0–0.1)
EOS PCT: 2 %
Eosinophils Absolute: 0.2 10*3/uL (ref 0.0–0.7)
HCT: 31.6 % — ABNORMAL LOW (ref 36.0–46.0)
Hemoglobin: 9.4 g/dL — ABNORMAL LOW (ref 12.0–15.0)
LYMPHS ABS: 2.1 10*3/uL (ref 0.7–4.0)
Lymphocytes Relative: 19 %
MCH: 23.7 pg — AB (ref 26.0–34.0)
MCHC: 29.7 g/dL — AB (ref 30.0–36.0)
MCV: 79.6 fL (ref 78.0–100.0)
MONOS PCT: 8 %
Monocytes Absolute: 0.9 10*3/uL (ref 0.1–1.0)
NEUTROS PCT: 71 %
Neutro Abs: 7.8 10*3/uL — ABNORMAL HIGH (ref 1.7–7.7)
PLATELETS: 231 10*3/uL (ref 150–400)
RBC: 3.97 MIL/uL (ref 3.87–5.11)
RDW: 15.4 % (ref 11.5–15.5)
WBC: 10.9 10*3/uL — ABNORMAL HIGH (ref 4.0–10.5)

## 2016-08-16 LAB — I-STAT TROPONIN, ED: TROPONIN I, POC: 0 ng/mL (ref 0.00–0.08)

## 2016-08-16 LAB — TSH: TSH: 0.461 u[IU]/mL (ref 0.350–4.500)

## 2016-08-16 MED ORDER — ALBUTEROL SULFATE (2.5 MG/3ML) 0.083% IN NEBU
5.0000 mg | INHALATION_SOLUTION | RESPIRATORY_TRACT | Status: DC | PRN
  Filled 2016-08-16: qty 6

## 2016-08-16 MED ORDER — HYDRALAZINE HCL 20 MG/ML IJ SOLN
5.0000 mg | Freq: Three times a day (TID) | INTRAMUSCULAR | Status: DC | PRN
  Filled 2016-08-16: qty 1

## 2016-08-16 MED ORDER — METHYLPREDNISOLONE SODIUM SUCC 125 MG IJ SOLR
125.0000 mg | Freq: Once | INTRAMUSCULAR | Status: AC
  Administered 2016-08-16: 125 mg via INTRAVENOUS
  Filled 2016-08-16: qty 2

## 2016-08-16 MED ORDER — ALBUTEROL (5 MG/ML) CONTINUOUS INHALATION SOLN
10.0000 mg/h | INHALATION_SOLUTION | Freq: Once | RESPIRATORY_TRACT | Status: AC
  Administered 2016-08-16: 10 mg/h via RESPIRATORY_TRACT
  Filled 2016-08-16: qty 20

## 2016-08-16 MED ORDER — METHYLPREDNISOLONE SODIUM SUCC 125 MG IJ SOLR
60.0000 mg | Freq: Two times a day (BID) | INTRAMUSCULAR | Status: AC
  Administered 2016-08-16 – 2016-08-18 (×6): 60 mg via INTRAVENOUS
  Filled 2016-08-16 (×6): qty 2

## 2016-08-16 MED ORDER — HYDROCODONE-ACETAMINOPHEN 5-325 MG PO TABS
1.0000 | ORAL_TABLET | ORAL | Status: DC | PRN
  Filled 2016-08-16: qty 2

## 2016-08-16 MED ORDER — ACETAMINOPHEN 650 MG RE SUPP
650.0000 mg | Freq: Four times a day (QID) | RECTAL | Status: DC | PRN
Start: 2016-08-16 — End: 2016-08-20

## 2016-08-16 MED ORDER — PANTOPRAZOLE SODIUM 40 MG PO TBEC
40.0000 mg | DELAYED_RELEASE_TABLET | Freq: Every day | ORAL | Status: DC
  Administered 2016-08-16 – 2016-08-19 (×4): 40 mg via ORAL
  Filled 2016-08-16 (×4): qty 1

## 2016-08-16 MED ORDER — PAROXETINE HCL 20 MG PO TABS
20.0000 mg | ORAL_TABLET | Freq: Every day | ORAL | Status: DC
  Administered 2016-08-16 – 2016-08-17 (×2): 20 mg via ORAL
  Filled 2016-08-16 (×2): qty 1

## 2016-08-16 MED ORDER — ONDANSETRON HCL 4 MG/2ML IJ SOLN
4.0000 mg | Freq: Four times a day (QID) | INTRAMUSCULAR | Status: DC | PRN
  Filled 2016-08-16: qty 2

## 2016-08-16 MED ORDER — ALBUTEROL SULFATE (2.5 MG/3ML) 0.083% IN NEBU
INHALATION_SOLUTION | RESPIRATORY_TRACT | Status: AC
  Filled 2016-08-16: qty 3

## 2016-08-16 MED ORDER — BISACODYL 10 MG RE SUPP: 10.0000 mg | Freq: Every day | RECTAL | Status: DC | PRN

## 2016-08-16 MED ORDER — MAGNESIUM SULFATE 2 GM/50ML IV SOLN
2.0000 g | Freq: Once | INTRAVENOUS | Status: AC
  Administered 2016-08-16: 2 g via INTRAVENOUS
  Filled 2016-08-16: qty 50

## 2016-08-16 MED ORDER — PAROXETINE HCL 30 MG PO TABS
30.0000 mg | ORAL_TABLET | Freq: Every day | ORAL | Status: DC
  Administered 2016-08-16 – 2016-08-17 (×2): 30 mg via ORAL
  Filled 2016-08-16 (×2): qty 1

## 2016-08-16 MED ORDER — ALBUTEROL SULFATE (2.5 MG/3ML) 0.083% IN NEBU
2.5000 mg | INHALATION_SOLUTION | RESPIRATORY_TRACT | Status: DC | PRN
  Administered 2016-08-17: 2.5 mg via RESPIRATORY_TRACT

## 2016-08-16 MED ORDER — LEVOTHYROXINE SODIUM 112 MCG PO TABS
224.0000 ug | ORAL_TABLET | Freq: Every day | ORAL | Status: DC
  Administered 2016-08-16 – 2016-08-19 (×4): 224 ug via ORAL
  Filled 2016-08-16 (×3): qty 2

## 2016-08-16 MED ORDER — ENOXAPARIN SODIUM 60 MG/0.6ML ~~LOC~~ SOLN: 60.0000 mg | SUBCUTANEOUS | Status: DC

## 2016-08-16 MED ORDER — MYCOPHENOLATE MOFETIL 500 MG PO TABS: 1000.0000 mg | ORAL_TABLET | Freq: Two times a day (BID) | ORAL | Status: DC

## 2016-08-16 MED ORDER — PYRIDOSTIGMINE BROMIDE 60 MG PO TABS
60.0000 mg | ORAL_TABLET | ORAL | Status: DC
  Administered 2016-08-16 – 2016-08-17 (×6): 60 mg via ORAL
  Filled 2016-08-16 (×8): qty 1

## 2016-08-16 MED ORDER — ENOXAPARIN SODIUM 150 MG/ML ~~LOC~~ SOLN
135.0000 mg | Freq: Two times a day (BID) | SUBCUTANEOUS | Status: DC
  Administered 2016-08-16 – 2016-08-17 (×3): 135 mg via SUBCUTANEOUS
  Filled 2016-08-16 (×4): qty 0.9

## 2016-08-16 MED ORDER — ASPIRIN EC 81 MG PO TBEC
81.0000 mg | DELAYED_RELEASE_TABLET | Freq: Every day | ORAL | Status: DC
  Administered 2016-08-16 – 2016-08-19 (×4): 81 mg via ORAL
  Filled 2016-08-16 (×3): qty 1

## 2016-08-16 MED ORDER — ALBUTEROL (5 MG/ML) CONTINUOUS INHALATION SOLN
10.0000 mg/h | INHALATION_SOLUTION | Freq: Once | RESPIRATORY_TRACT | Status: AC
  Administered 2016-08-16: 10 mg/h via RESPIRATORY_TRACT

## 2016-08-16 MED ORDER — ONDANSETRON HCL 4 MG PO TABS: 4.0000 mg | ORAL_TABLET | Freq: Four times a day (QID) | ORAL | Status: DC | PRN

## 2016-08-16 MED ORDER — ALBUTEROL SULFATE (2.5 MG/3ML) 0.083% IN NEBU
5.0000 mg | INHALATION_SOLUTION | Freq: Once | RESPIRATORY_TRACT | Status: AC
  Administered 2016-08-16: 5 mg via RESPIRATORY_TRACT

## 2016-08-16 MED ORDER — GUAIFENESIN ER 600 MG PO TB12
600.0000 mg | ORAL_TABLET | Freq: Two times a day (BID) | ORAL | Status: DC
  Administered 2016-08-16 – 2016-08-19 (×7): 600 mg via ORAL
  Filled 2016-08-16 (×7): qty 1

## 2016-08-16 MED ORDER — INSULIN ASPART 100 UNIT/ML ~~LOC~~ SOLN: 0.0000 [IU] | Freq: Three times a day (TID) | SUBCUTANEOUS | Status: DC

## 2016-08-16 MED ORDER — LEVOFLOXACIN IN D5W 750 MG/150ML IV SOLN: 750.0000 mg | INTRAVENOUS | Status: DC

## 2016-08-16 MED ORDER — IPRATROPIUM-ALBUTEROL 0.5-2.5 (3) MG/3ML IN SOLN
3.0000 mL | Freq: Three times a day (TID) | RESPIRATORY_TRACT | Status: DC
  Administered 2016-08-16 – 2016-08-19 (×10): 3 mL via RESPIRATORY_TRACT
  Filled 2016-08-16 (×11): qty 3

## 2016-08-16 MED ORDER — ACETAMINOPHEN 325 MG PO TABS
650.0000 mg | ORAL_TABLET | Freq: Four times a day (QID) | ORAL | Status: DC | PRN
  Administered 2016-08-19: 650 mg via ORAL
  Filled 2016-08-16: qty 2

## 2016-08-16 MED ORDER — MYCOPHENOLATE MOFETIL 250 MG PO CAPS
500.0000 mg | ORAL_CAPSULE | Freq: Two times a day (BID) | ORAL | Status: DC
  Administered 2016-08-16 – 2016-08-17 (×3): 500 mg via ORAL
  Filled 2016-08-16 (×2): qty 2

## 2016-08-16 MED ORDER — IPRATROPIUM-ALBUTEROL 0.5-2.5 (3) MG/3ML IN SOLN
3.0000 mL | RESPIRATORY_TRACT | Status: DC
  Administered 2016-08-16: 3 mL via RESPIRATORY_TRACT
  Filled 2016-08-16: qty 3

## 2016-08-16 MED ORDER — IOPAMIDOL (ISOVUE-370) INJECTION 76%
INTRAVENOUS | Status: AC
  Administered 2016-08-16: 100 mL
  Filled 2016-08-16: qty 100

## 2016-08-16 MED ORDER — METHYLPREDNISOLONE SODIUM SUCC 125 MG IJ SOLR: 125.0000 mg | Freq: Two times a day (BID) | INTRAMUSCULAR | Status: DC

## 2016-08-16 MED ORDER — SENNOSIDES-DOCUSATE SODIUM 8.6-50 MG PO TABS: 1.0000 | ORAL_TABLET | Freq: Every evening | ORAL | Status: DC | PRN

## 2016-08-16 NOTE — ED Notes (Signed)
RT at bedside.

## 2016-08-16 NOTE — ED Notes (Signed)
ED Provider at bedside. 

## 2016-08-16 NOTE — H&P (Signed)
History and Physical    [name] ZOX:[id number] DOB: December 25, 1965 DOA: 08/16/2016   PCP: Default, Provider, MD   Patient coming from:  Home    Chief Complaint: Shortness of breath  HPI: [name] is a 51 y.o. female with medical history significant for Asthma not on home O2, Myasthenia Gravis, Anxiety, Hypothyroidism presenting with 2 day history of progressive constant, severe shortness of breath, accompanied by subjective fever, bilateral lower extremity edema, sinus congestion, and moderate non productive cough. The patient reports that these feels like typical asthma exacerbations symptoms. Of note, she was on a  [place] from [place] with some stops, about 13 hrs just prior to presentation .She may have had sick contacts at the airport. She is on no OTC treatments prior to presentation. She did try to take albuterol inhaler prior to admission without resolution of symptoms.; Reports mild sinus headaches and mild rhinorrhea. Denies epistaxis, nausea or emesis or hemoptysis Denies any fevers, chest pain, palpitations, loss of consciousness. She has never been hospitalized for asthma exacerbation. Of note, she also reports bilateral lower extremity edema which she attributes to flying, but denies calf tenderness. Patient denies any history of cancer. No tobacco, quit 4 months ago.    ED Course:  BP (!) 166/98   Pulse (!) 106   Temp 98.9 F (37.2 C) (Oral)   Resp (!) 22   Ht 5' 1.5" (1.562 m)   Wt 127 kg (280 lb)   SpO2 100%   BMI 52.05 kg/m    CXR with pulmonary vascular congestion WBC 10.9  Afebrile O2 100% 2 L . Received Nebs with Albuterol x3 and Duoneb , Magnesium Sulfate  2 g, Solumedrol 125 mg x1 IV  with some improvement of symptoms    Review of Systems:  As per HPI otherwise all other systems reviewed and are negative  Past Medical History:  Diagnosis Date  . Asthma   . Myasthenia gravis [facility](HCC)     Past Surgical History:  Procedure  Laterality Date  . THYROID SURGERY    . TONSILLECTOMY    . TUBAL LIGATION      Social History Social History   Social History  . Marital status: Single    Spouse name: N/A  . Number of children: N/A  . Years of education: N/A   Occupational History  . Not on file.   Social History Main Topics  . Smoking status: Former Games developermoker  . Smokeless tobacco: Never Used  . Alcohol use Yes  . Drug use: Unknown  . Sexual activity: Not on file   Other Topics Concern  . Not on file   Social History Narrative  . No narrative on file     No Known Allergies  No family history on file.    Prior to Admission medications   Medication Sig Start Date End Date Taking? Authorizing Provider  albuterol (VENTOLIN HFA) 108 (90 Base) MCG/ACT inhaler Inhale 2 puffs into the lungs every 4 (four) hours as needed for wheezing or shortness of breath.  05/28/16  Yes [provider]  fluticasone (FLONASE) 50 MCG/ACT nasal spray Place 1 spray into both nostrils daily.  05/30/15  Yes [provider]  levothyroxine (SYNTHROID, LEVOTHROID) 112 MCG tablet Take 224 mcg by mouth daily. 07/02/16  Yes [provider]  mycophenolate (CELLCEPT) 500 MG tablet Take 1,000 mg by mouth 2 (two) times daily. 06/01/16  Yes [provider]  omeprazole (PRILOSEC) 20 MG capsule Take 20 mg  by mouth 2 (two) times daily. 05/28/16  Yes [provider]  PARoxetine (PAXIL) 20 MG tablet Take 20 mg by mouth daily. Take with paxil 30 mg to equal 50 mg   Yes [provider]  PARoxetine (PAXIL) 30 MG tablet Take 30 mg by mouth daily. Take with paxil 20 mg to equal 50 mg   Yes [provider]  pyridostigmine (MESTINON) 60 MG tablet Take 60 mg by mouth every 4 (four) hours. 06/09/16  Yes [provider]  diphenoxylate-atropine (LOMOTIL) 2.5-0.025 MG per tablet Take 1 tablet by mouth 4 (four) times daily as needed for diarrhea or loose stools. Patient not taking: Reported on  08/16/2016 05/22/12   Archie PattenFrazier, Natalie K, CNM  promethazine (PHENERGAN) 25 MG tablet Take 1 tablet (25 mg total) by mouth every 6 (six) hours as needed for nausea. Patient not taking: Reported on 08/16/2016 05/22/12   Archie PattenFrazier, Natalie K, CNM    Physical Exam:  Vitals:   08/16/16 0545 08/16/16 0629 08/16/16 0645 08/16/16 0700  BP: (!) 174/89  (!) 161/104 (!) 166/98  Pulse: 98  (!) 105 (!) 106  Resp: 17  (!) 23 (!) 22  Temp:      TempSrc:      SpO2: 92% 100% 100% 100%  Weight:      Height:       Constitutional: NAD, Receiving nebulizer treatments at this time. Eyes: PERRL, lids and conjunctivae normal. Mild exophtalmia. R lateral deviation  ENMT: Mucous membranes are moist, without exudate or lesions  Neck: normal, supple, no masses, no thyromegaly Respiratory: decreased breath sounds at the bases, there is bilateral wheezing out evil, trace of crackles at the bases. No rhonchi. No accessory muscle use. Some sounds may be massed by her body habitus. Cardiovascular: Regular rate and rhythm, no murmurs, rubs or gallops. Trace of bilateral lower extremity edema  2+ pedal pulses. No carotid bruits. No homan's sign Abdomen: Soft, morbidly obese, non tender, No hepatosplenomegaly. Bowel sounds positive.  Musculoskeletal: no clubbing / cyanosis. Moves all extremities Skin: no jaundice, No lesions.  Neurologic: Sensation intact  Strength equal in all extremities Psychiatric:   Alert and oriented x 3. Normal mood.     Labs on Admission: I have personally reviewed following labs and imaging studies  CBC:  Recent Labs Lab 08/16/16 0223  WBC 10.9*  NEUTROABS 7.8*  HGB 9.4*  HCT 31.6*  MCV 79.6  PLT 231    Basic Metabolic Panel:  Recent Labs Lab 08/16/16 0223  NA 141  K 3.5  CL 104  CO2 31  GLUCOSE 110*  BUN 5*  CREATININE 0.67  CALCIUM 8.8*    GFR: Estimated Creatinine Clearance: 105.3 mL/min (by C-G formula based on SCr of 0.67 mg/dL).  Liver Function Tests: No results  for input(s): AST, ALT, ALKPHOS, BILITOT, PROT, ALBUMIN in the last 168 hours. No results for input(s): LIPASE, AMYLASE in the last 168 hours. No results for input(s): AMMONIA in the last 168 hours.  Coagulation Profile: No results for input(s): INR, PROTIME in the last 168 hours.  Cardiac Enzymes: No results for input(s): CKTOTAL, CKMB, CKMBINDEX, TROPONINI in the last 168 hours.  BNP (last 3 results) No results for input(s): PROBNP in the last 8760 hours.  HbA1C: No results for input(s): HGBA1C in the last 72 hours.  CBG: No results for input(s): GLUCAP in the last 168 hours.  Lipid Profile: No results for input(s): CHOL, HDL, LDLCALC, TRIG, CHOLHDL, LDLDIRECT in the last 72 hours.  Thyroid Function Tests: No results for input(s): TSH, T4TOTAL, FREET4, T3FREE, THYROIDAB in the last 72 hours.  Anemia Panel: No results for input(s): VITAMINB12, FOLATE, FERRITIN, TIBC, IRON, RETICCTPCT in the last 72 hours.  Urine analysis:    Component Value Date/Time   COLORURINE YELLOW 05/22/2012 1127   APPEARANCEUR CLEAR 05/22/2012 1127   LABSPEC >1.030 (H) 05/22/2012 1127   PHURINE 5.5 05/22/2012 1127   GLUCOSEU NEGATIVE 05/22/2012 1127   HGBUR NEGATIVE 05/22/2012 1127   BILIRUBINUR NEGATIVE 05/22/2012 1127   KETONESUR NEGATIVE 05/22/2012 1127   PROTEINUR 30 (A) 05/22/2012 1127   UROBILINOGEN 0.2 05/22/2012 1127   NITRITE NEGATIVE 05/22/2012 1127   LEUKOCYTESUR NEGATIVE 05/22/2012 1127    Sepsis Labs: @LABRCNTIP (procalcitonin:4,lacticidven:4) )No results found for this or any previous visit (from the past 240 hour(s)).   Radiological Exams on Admission: Dg Chest 2 View  Result Date: 08/16/2016 CLINICAL DATA:  51 y/o  F; shortness of breath. EXAM: CHEST  2 VIEW COMPARISON:  08/05/2011 chest radiograph FINDINGS: Mild cardiomegaly and enlargement of pulmonary vasculature. Increase interstitial markings of the lungs. Stable linear opacity left lung base, probably scarring. No  pleural effusion or pneumothorax. Post median sternotomy. Wires are aligned. No acute osseous abnormality is evident. IMPRESSION: Cardiomegaly and pulmonary vascular congestion. Enlargement of central pulmonary vasculature suggests pulmonary artery hypertension. Electronically Signed   By: Mitzi Hansen M.D.   On: 08/16/2016 01:00    EKG: Independently reviewed.  Assessment/Plan Active Problems:   Asthma exacerbation   GERD (gastroesophageal reflux disease)   Depression   Anxiety   Myasthenia gravis (HCC)   Sleep apnea    Acute respiratory failure with  New episode of hypoxia due to asthma exacerbation  CXR with pulmonary vascular congestion. Mild  Tachycardia  WBC 10.9  Afebrile Osats were 82 RA during exacerbation now on  O2 100% 2 L . Received Nebs with Albuterol x4 and Duoneb , Magnesium Sulfate  2 g, Solumedrol 125 mg x1 IV  Without significant improvement of symptoms . Recent long distance trip  Admit to IP tele  Levaquin Solumedrol 60 mg bid IV  Protonix and SSI due to steroids (blood sugar 110)  Mucinex O2 continuous  Blood and sputum culture CBC in am Incentive spirometry CT angio of the chest r/o PE due to low Osats, recent plane ride and despite nebs, symptoms not improving  Lower extremity duplex due to leg swelling and above described CXR in am   OSA  CPAP nightly  Myasthenia Gravis Hold Cellcept  Hypothyroidism: Continue home Synthroid Check TSH  Anxiety/ Depression Continue home Paxil 50 mg daily   Hypertension BP   166/98   Pulse  106  With CXR with pulmonary vascular congestion . Denies history of same . Tn 0. EKG SR  Add Hydralazine Q6 hours as needed for BP 160/90  Will need to f/u as OP for secondary HTN     DVT prophylaxis: Lovenox   Code Status:   Full     Family Communication:  Discussed with patient Disposition Plan: Expect patient to be discharged to home after condition improves Consults called:    None  Admission status: IP  telemetry   Celesta Funderburk E, PA-C [facility]   08/16/2016, 7:09 AM

## 2016-08-16 NOTE — Progress Notes (Signed)
This is a no charge note  [facility] per Dr. Blinda LeatherwoodPollina.  51 year old lady with a past medical history of asthma, myasthenia gravis, hypothyroidism, anxiety, who presents with 2 days of shortness of breath, cough and wheezing. Patient was on airplane for 13 hours, but does not have any chest pain per Dr. Blinda LeatherwoodPollina. Chest x-ray showed pulmonary vascular congestion. WBC 10.9, no fever. Per EDP, Clinically consistent with asthma exacerbation. Patient is admitted to telemetry bed as inpatient.    Lorretta HarpXilin Marriana Hibberd, MD  [facility] Pager [phone]  If 7PM-7AM, please contact night-coverage [email or web address] Password [email or web address] 08/16/2016, 6:32 AM

## 2016-08-16 NOTE — ED Notes (Signed)
RT called for continuous neb 

## 2016-08-16 NOTE — ED Triage Notes (Signed)
Pt to ED with c/o shortness of breath, onset yesterday.,  Pt was on a airplane today for 13-14 hours.  Pt also c/o bil leg swelling

## 2016-08-16 NOTE — ED Provider Notes (Signed)
[facility]-EMERGENCY DEPT Provider Note   CSN: [id number] Arrival date & time: 08/15/16  2353 By signing my name below, I, [name], attest that this documentation has been prepared under the direction and in the presence of [name], Barbara Brim, MD . Electronically Signed: Levon Blankenship, Scribe. 08/16/2016. 2:25 AM.   History   Chief Complaint Chief Complaint  Patient presents with  . Shortness of Breath   HPI [name] is a 51 y.o. female who presents to the Emergency Department complaining of constant, severe shortness of breath onset two days ago. She reports associated subjective fever, bilateral leg edema, sinus congestion and moderate cough productive of sputum. Pt states this feels like typical asthma exacerbation symptoms. Pt was on an airplane today for 13-14 hours, but reports that her symptoms began prior to the plane ride. No OTC treatments tried for these symptoms PTA. Pt has no other acute complaints or associated symptoms at this time.    The history is provided by the patient. No language interpreter was used.    Past Medical History:  Diagnosis Date  . Asthma   . Myasthenia gravis [facility])     Patient Active Problem List   Diagnosis Date Noted  . Asthma exacerbation 08/16/2016    Past Surgical History:  Procedure Laterality Date  . THYROID SURGERY    . TONSILLECTOMY    . TUBAL LIGATION      OB History    No data available      Home Medications    Prior to Admission medications   Medication Sig Start Date End Date Taking? Authorizing Provider  albuterol (VENTOLIN HFA) 108 (90 Base) MCG/ACT inhaler Inhale 2 puffs into the lungs every 4 (four) hours as needed for wheezing or shortness of breath.  05/28/16  Yes [provider]  fluticasone (FLONASE) 50 MCG/ACT nasal spray Place 1 spray into both nostrils daily.  05/30/15  Yes [provider]  levothyroxine (SYNTHROID, LEVOTHROID) 112 MCG tablet Take 224 mcg by mouth daily. 07/02/16   Yes [provider]  mycophenolate (CELLCEPT) 500 MG tablet Take 1,000 mg by mouth 2 (two) times daily. 06/01/16  Yes [provider]  omeprazole (PRILOSEC) 20 MG capsule Take 20 mg by mouth 2 (two) times daily. 05/28/16  Yes [provider]  PARoxetine (PAXIL) 20 MG tablet Take 20 mg by mouth daily. Take with paxil 30 mg to equal 50 mg   Yes [provider]  PARoxetine (PAXIL) 30 MG tablet Take 30 mg by mouth daily. Take with paxil 20 mg to equal 50 mg   Yes [provider]  pyridostigmine (MESTINON) 60 MG tablet Take 60 mg by mouth every 4 (four) hours. 06/09/16  Yes [provider]  diphenoxylate-atropine (LOMOTIL) 2.5-0.025 MG per tablet Take 1 tablet by mouth 4 (four) times daily as needed for diarrhea or loose stools. Patient not taking: Reported on 08/16/2016 05/22/12   Archie Patten, CNM  promethazine (PHENERGAN) 25 MG tablet Take 1 tablet (25 mg total) by mouth every 6 (six) hours as needed for nausea. Patient not taking: Reported on 08/16/2016 05/22/12   Archie Patten, CNM    Family History No family history on file.  Social History Social History  Substance Use Topics  . Smoking status: Former Games developer  . Smokeless tobacco: Never Used  . Alcohol use Yes     Allergies   Patient has no known allergies.   Review of Systems Review of Systems All systems reviewed and are  negative for acute change except as noted in the HPI.   Physical Exam Updated Vital Signs BP (!) 174/89   Pulse 98   Temp 98.9 F (37.2 C) (Oral)   Resp 17   Ht 5' 1.5" (1.562 m)   Wt 127 kg (280 lb)   SpO2 100%   BMI 52.05 kg/m   Physical Exam  Constitutional: She is oriented to person, place, and time. She appears well-developed and well-nourished. No distress.  HENT:  Head: Normocephalic and atraumatic.  Right Ear: Hearing normal.  Left Ear: Hearing normal.  Nose: Nose normal.  Mouth/Throat: Oropharynx is clear and moist and mucous  membranes are normal.  Eyes: Pupils are equal, round, and reactive to light. Conjunctivae and EOM are normal.  Neck: Normal range of motion. Neck supple.  Cardiovascular: Regular rhythm, S1 normal and S2 normal.  Exam reveals no gallop and no friction rub.   No murmur heard. Pulmonary/Chest: Effort normal. No respiratory distress. She has wheezes. She exhibits no tenderness.  Diminished breath sounds with wheezing bilaterally  Abdominal: Soft. Normal appearance and bowel sounds are normal. There is no hepatosplenomegaly. There is no tenderness. There is no rebound, no guarding, no tenderness at McBurney's point and negative Murphy's sign. No hernia.  Musculoskeletal: Normal range of motion. She exhibits edema.  1+ pitting edema to BLE  Neurological: She is alert and oriented to person, place, and time. She has normal strength. No cranial nerve deficit or sensory deficit. Coordination normal. GCS eye subscore is 4. GCS verbal subscore is 5. GCS motor subscore is 6.  Skin: Skin is warm, dry and intact. No rash noted. No cyanosis.  Psychiatric: She has a normal mood and affect. Her speech is normal and behavior is normal. Thought content normal.  Nursing note and vitals reviewed.  ED Treatments / Results  DIAGNOSTIC STUDIES:  Oxygen Saturation is 99% on RA, normal by my interpretation.    COORDINATION OF CARE:  2:23 AM Discussed treatment plan with pt at bedside and pt agreed to plan.   Labs (all labs ordered are listed, but only abnormal results are displayed) Labs Reviewed  CBC WITH DIFFERENTIAL/PLATELET - Abnormal; Notable for the following:       Result Value   WBC 10.9 (*)    Hemoglobin 9.4 (*)    HCT 31.6 (*)    MCH 23.7 (*)    MCHC 29.7 (*)    Neutro Abs 7.8 (*)    All other components within normal limits  BASIC METABOLIC PANEL - Abnormal; Notable for the following:    Glucose, Bld 110 (*)    BUN 5 (*)    Calcium 8.8 (*)    All other components within normal limits    BRAIN NATRIURETIC PEPTIDE  I-STAT TROPONIN, ED    EKG  EKG Interpretation None       Radiology Dg Chest 2 View  Result Date: 08/16/2016 CLINICAL DATA:  51 y/o  F; shortness of breath. EXAM: CHEST  2 VIEW COMPARISON:  08/05/2011 chest radiograph FINDINGS: Mild cardiomegaly and enlargement of pulmonary vasculature. Increase interstitial markings of the lungs. Stable linear opacity left lung base, probably scarring. No pleural effusion or pneumothorax. Post median sternotomy. Wires are aligned. No acute osseous abnormality is evident. IMPRESSION: Cardiomegaly and pulmonary vascular congestion. Enlargement of central pulmonary vasculature suggests pulmonary artery hypertension. Electronically Signed   By: Mitzi HansenLance  Furusawa-Stratton M.D.   On: 08/16/2016 01:00    Procedures Procedures (including critical care time)  Medications Ordered in  ED Medications  albuterol (PROVENTIL) (2.5 MG/3ML) 0.083% nebulizer solution (not administered)  albuterol (PROVENTIL) (2.5 MG/3ML) 0.083% nebulizer solution (not administered)  magnesium sulfate IVPB 2 g 50 mL (not administered)  albuterol (PROVENTIL) (2.5 MG/3ML) 0.083% nebulizer solution 5 mg (not administered)  ipratropium-albuterol (DUONEB) 0.5-2.5 (3) MG/3ML nebulizer solution 3 mL (not administered)  albuterol (PROVENTIL) (2.5 MG/3ML) 0.083% nebulizer solution 5 mg (5 mg Nebulization Given 08/16/16 0039)  methylPREDNISolone sodium succinate (SOLU-MEDROL) 125 mg/2 mL injection 125 mg (125 mg Intravenous Given 08/16/16 0235)  albuterol (PROVENTIL,VENTOLIN) solution continuous neb (10 mg/hr Nebulization Given 08/16/16 0304)  albuterol (PROVENTIL,VENTOLIN) solution continuous neb (10 mg/hr Nebulization Given 08/16/16 0629)     Initial Impression / Assessment and Plan / ED Course  I have reviewed the triage vital signs and the nursing notes.  Pertinent labs & imaging results that were available during my care of the patient were reviewed by me and  considered in my medical decision making (see chart for details).     Patient presents to the emergency department for evaluation of shortness of breath. Patient has a history of asthma. She reports that she started having increased shortness of breath, chest congestion and some cough 2 days ago. She has been expressing wheezing not responding to albuterol. She did have a long flight yesterday, but all symptoms started 1 day prior to that and has not worsened. She is not expressing any chest pain. I do not suspect PE. Patient has significant bronchospasm present here in the ER that has been refractory to treatment. She received multiple nebulizer treatments followed by a continuous nebulization treatment with only some improvement. She was unable to ambulate because of increased wheezing, shortness of breath and oxygen dropping to 82%. She will therefore require hospitalization for further management.  Final Clinical Impressions(s) / ED Diagnoses   Final diagnoses:  Moderate persistent asthma with exacerbation    New Prescriptions New Prescriptions   No medications on file   I personally performed the services described in this documentation, which was scribed in my presence. The recorded information has been reviewed and is accurate.     Gilda CreasePollina, Mee Macdonnell J, MD 08/16/16 [phone]

## 2016-08-16 NOTE — Progress Notes (Signed)
Pharmacy Antibiotic Note  [name] is a 51 y.o. female admitted on 08/16/2016 with asthma exacerbation.  Pharmacy has been consulted for Levaquin dosing.  Plan: Levaquin 750mg  IV Q24H.  Height: 5' 1.5" (156.2 cm) Weight: 280 lb (127 kg) IBW/kg (Calculated) : 48.95  Temp (24hrs), Avg:98.9 F (37.2 C), Min:98.9 F (37.2 C), Max:98.9 F (37.2 C)   Recent Labs Lab 08/16/16 0223  WBC 10.9*  CREATININE 0.67    Estimated Creatinine Clearance: 105.3 mL/min (by C-G formula based on SCr of 0.67 mg/dL).    No Known Allergies   Thank you for allowing pharmacy to be a part of this patient's care.  [name], PharmD, BCPS  08/16/2016 7:16 AM

## 2016-08-17 ENCOUNTER — Inpatient Hospital Stay (HOSPITAL_COMMUNITY): Payer: Medicare Other

## 2016-08-17 DIAGNOSIS — I2699 Other pulmonary embolism without acute cor pulmonale: Secondary | ICD-10-CM

## 2016-08-17 DIAGNOSIS — J4541 Moderate persistent asthma with (acute) exacerbation: Principal | ICD-10-CM

## 2016-08-17 DIAGNOSIS — R0602 Shortness of breath: Secondary | ICD-10-CM

## 2016-08-17 LAB — GLUCOSE, CAPILLARY
GLUCOSE-CAPILLARY: 134 mg/dL — AB (ref 65–99)
GLUCOSE-CAPILLARY: 137 mg/dL — AB (ref 65–99)
Glucose-Capillary: 128 mg/dL — ABNORMAL HIGH (ref 65–99)
Glucose-Capillary: 106 mg/dL — ABNORMAL HIGH (ref 65–99)

## 2016-08-17 LAB — ECHOCARDIOGRAM COMPLETE
AVPHT: 494 ms
CHL CUP MV DEC (S): 208
EWDT: 208 ms
FS: 31 % (ref 28–44)
HEIGHTINCHES: 61 in
IV/PV OW: 1
LADIAMINDEX: 1.87 cm/m2
LASIZE: 47 mm
LAVOL: 116 mL
LAVOLA4C: 89.4 mL
LAVOLIN: 46.1 mL/m2
LEFT ATRIUM END SYS DIAM: 47 mm
LVELAT: 7.07 cm/s
LVOT area: 3.14 cm2
LVOT diameter: 20 mm
Lateral S' vel: 13.6 cm/s
MVPKEVEL: 1.4 m/s
PW: 13 mm — AB (ref 0.6–1.1)
RV TAPSE: 27 mm
TDI e' lateral: 7.07
TDI e' medial: 8.16
VTI: 191 cm
Weight: 4800 oz

## 2016-08-17 LAB — CBC
HEMATOCRIT: 34 % — AB (ref 36.0–46.0)
HEMOGLOBIN: 9.8 g/dL — AB (ref 12.0–15.0)
MCH: 23.1 pg — ABNORMAL LOW (ref 26.0–34.0)
MCHC: 28.8 g/dL — AB (ref 30.0–36.0)
MCV: 80 fL (ref 78.0–100.0)
Platelets: 237 10*3/uL (ref 150–400)
RBC: 4.25 MIL/uL (ref 3.87–5.11)
RDW: 15.7 % — AB (ref 11.5–15.5)
WBC: 13.8 10*3/uL — ABNORMAL HIGH (ref 4.0–10.5)

## 2016-08-17 LAB — COMPREHENSIVE METABOLIC PANEL
ALBUMIN: 3.3 g/dL — AB (ref 3.5–5.0)
ALT: 16 U/L (ref 14–54)
ANION GAP: 7 (ref 5–15)
AST: 18 U/L (ref 15–41)
Alkaline Phosphatase: 58 U/L (ref 38–126)
BUN: 9 mg/dL (ref 6–20)
CHLORIDE: 104 mmol/L (ref 101–111)
CO2: 29 mmol/L (ref 22–32)
Calcium: 9 mg/dL (ref 8.9–10.3)
Creatinine, Ser: 0.63 mg/dL (ref 0.44–1.00)
GFR calc Af Amer: >60 mL/min (ref >60)
GFR calc non Af Amer: >60 mL/min (ref >60)
GLUCOSE: 161 mg/dL — AB (ref 65–99)
POTASSIUM: 4.7 mmol/L (ref 3.5–5.1)
SODIUM: 140 mmol/L (ref 135–145)
TOTAL PROTEIN: 7.5 g/dL (ref 6.5–8.1)
Total Bilirubin: 0.5 mg/dL (ref 0.3–1.2)

## 2016-08-17 LAB — PROTIME-INR
INR: 1.16
Prothrombin Time: 14.8 seconds (ref 11.4–15.2)

## 2016-08-17 LAB — HEMOGLOBIN A1C
HEMOGLOBIN A1C: 6 % — AB (ref 4.8–5.6)
Mean Plasma Glucose: 126 mg/dL

## 2016-08-17 LAB — HOMOCYSTEINE: HOMOCYSTEINE-NORM: 7 umol/L (ref 0.0–15.0)

## 2016-08-17 MED ORDER — MYCOPHENOLATE MOFETIL 250 MG PO CAPS
1000.0000 mg | ORAL_CAPSULE | Freq: Two times a day (BID) | ORAL | Status: DC
  Administered 2016-08-17 – 2016-08-19 (×4): 1000 mg via ORAL
  Filled 2016-08-17 (×4): qty 4

## 2016-08-17 MED ORDER — PERFLUTREN LIPID MICROSPHERE
1.0000 mL | INTRAVENOUS | Status: AC | PRN
  Administered 2016-08-17: 5 mL via INTRAVENOUS
  Filled 2016-08-17: qty 10

## 2016-08-17 MED ORDER — PAROXETINE HCL 20 MG PO TABS
50.0000 mg | ORAL_TABLET | Freq: Every day | ORAL | Status: DC
  Administered 2016-08-18 – 2016-08-19 (×2): 50 mg via ORAL
  Filled 2016-08-17 (×2): qty 3

## 2016-08-17 MED ORDER — LISINOPRIL 10 MG PO TABS
10.0000 mg | ORAL_TABLET | Freq: Every day | ORAL | Status: DC
  Administered 2016-08-18 – 2016-08-19 (×2): 10 mg via ORAL
  Filled 2016-08-17 (×3): qty 1

## 2016-08-17 MED ORDER — RIVAROXABAN 20 MG PO TABS: 20.0000 mg | ORAL_TABLET | Freq: Every day | ORAL | Status: DC

## 2016-08-17 MED ORDER — PYRIDOSTIGMINE BROMIDE 60 MG PO TABS
30.0000 mg | ORAL_TABLET | ORAL | Status: DC
  Filled 2016-08-17: qty 0.5

## 2016-08-17 MED ORDER — ZOLPIDEM TARTRATE 5 MG PO TABS: 5.0000 mg | ORAL_TABLET | Freq: Every evening | ORAL | Status: DC | PRN

## 2016-08-17 MED ORDER — PYRIDOSTIGMINE BROMIDE 60 MG PO TABS
30.0000 mg | ORAL_TABLET | Freq: Four times a day (QID) | ORAL | Status: DC
  Administered 2016-08-17: 30 mg via ORAL
  Filled 2016-08-17 (×3): qty 0.5

## 2016-08-17 MED ORDER — LOPERAMIDE HCL 2 MG PO CAPS
2.0000 mg | ORAL_CAPSULE | ORAL | Status: DC | PRN
  Administered 2016-08-17: 2 mg via ORAL
  Filled 2016-08-17: qty 1

## 2016-08-17 MED ORDER — PYRIDOSTIGMINE BROMIDE 60 MG PO TABS
30.0000 mg | ORAL_TABLET | Freq: Every day | ORAL | Status: DC
  Administered 2016-08-17 – 2016-08-19 (×10): 30 mg via ORAL
  Filled 2016-08-17 (×12): qty 0.5

## 2016-08-17 MED ORDER — RIVAROXABAN 15 MG PO TABS
15.0000 mg | ORAL_TABLET | Freq: Two times a day (BID) | ORAL | Status: DC
  Administered 2016-08-17 – 2016-08-19 (×5): 15 mg via ORAL
  Filled 2016-08-17 (×5): qty 1

## 2016-08-17 NOTE — Progress Notes (Signed)
Pt refused to take pyridostigmine tablet 60mg  as scheduled she took half of tablet which is 30mg , per pt that is how her primary doctor told her to take it, started yelling at me because she wanted her med at exactly 6am and I took it to her at 6;19am, pt sister was in room with her telling her to calm down but was still yelling, her sister came to the front desk to apologise to the nurse about pt behavior,

## 2016-08-17 NOTE — Progress Notes (Signed)
PROGRESS NOTE    [name]  ZOX:[id number] DOB: Dec 19, 1965 DOA: 08/16/2016 PCP: Default, Provider, MD   Outpatient Specialists:     Brief Narrative:  [name] is a 51 y.o. female with a Past Medical History significant for myasthenia gravis, hypothyroidism, asthma who presents with shortness of breath after plane ride. CTA shows PE. Started on therapeutic dose of Lovenox. Lower extremity Dopplers ordered. Echo ordered. Patient still very dyspneic. Better after steroids and breathing treatments so will continue. Hypercoag panel ordered.   Assessment & Plan:   Active Problems:   Asthma exacerbation   GERD (gastroesophageal reflux disease)   Depression   Anxiety   Myasthenia gravis (HCC)   Sleep apnea   Acute respiratory failure with  New episode of hypoxia due to asthma exacerbation  IV steroids IV levaquin nebs O2 continuous -- wean as tolerated CT angio of the chest r/o PE : SQ lovenox-- will change to xarelto PO Lower extremity duplex ordered -echo  OSA  CPAP nightly  Myasthenia Gravis -resume home meds  Hypothyroidism: Continue home Synthroid TSH normal  Anxiety/ Depression Continue home Paxil 50 mg daily  Hypertension Add ACE  DVT prophylaxis:  Fully anticoagulated   Code Status: Full Code   Family Communication: At bedside  Disposition Plan:     Consultants:       Subjective: Having diarrhea after morning medications  Objective: Vitals:   08/17/16 0500 08/17/16 0535 08/17/16 0546 08/17/16 0844  BP:  (!) 157/77    Pulse:  85    Resp:  (!) 22    Temp:  98.4 F (36.9 C)    TempSrc:      SpO2:  100% 100% 98%  Weight: 136.1 kg (300 lb)     Height:        Intake/Output Summary (Last 24 hours) at 08/17/16 1301 Last data filed at 08/17/16 8119  Gross per 24 hour  Intake              420 ml  Output                0 ml  Net              420 ml   Filed Weights   08/16/16 0012 08/16/16 0900 08/17/16 0500    Weight: 127 kg (280 lb) (!) 137.2 kg (302 lb 6.4 oz) 136.1 kg (300 lb)    Examination:  General exam: Appears calm and comfortable  Respiratory system: diminished, expiratory wheezing Cardiovascular system: S1 & S2 heard, RRR. No JVD, murmurs, rubs, gallops or clicks. No pedal edema. Gastrointestinal system: Abdomen is obese, soft and nontender. No organomegaly or masses felt. Normal bowel sounds heard. Central nervous system: Alert and oriented. No focal neurological deficits. Extremities: Symmetric 5 x 5 power. Skin: No rashes, lesions or ulcers Psychiatry: Judgement and insight appear normal. Mood & affect appropriate.     Data Reviewed: I have personally reviewed following labs and imaging studies  CBC:  Recent Labs Lab 08/16/16 0223 08/17/16 0500  WBC 10.9* 13.8*  NEUTROABS 7.8*  --   HGB 9.4* 9.8*  HCT 31.6* 34.0*  MCV 79.6 80.0  PLT 231 237   Basic Metabolic Panel:  Recent Labs Lab 08/16/16 0223 08/17/16 0500  NA 141 140  K 3.5 4.7  CL 104 104  CO2 31 29  GLUCOSE 110* 161*  BUN 5* 9  CREATININE 0.67 0.63  CALCIUM 8.8* 9.0   GFR: Estimated Creatinine Clearance: 109.1 mL/min (  by C-G formula based on SCr of 0.63 mg/dL). Liver Function Tests:  Recent Labs Lab 08/17/16 0500  AST 18  ALT 16  ALKPHOS 58  BILITOT 0.5  PROT 7.5  ALBUMIN 3.3*   No results for input(s): LIPASE, AMYLASE in the last 168 hours. No results for input(s): AMMONIA in the last 168 hours. Coagulation Profile:  Recent Labs Lab 08/17/16 0500  INR 1.16   Cardiac Enzymes: No results for input(s): CKTOTAL, CKMB, CKMBINDEX, TROPONINI in the last 168 hours. BNP (last 3 results) No results for input(s): PROBNP in the last 8760 hours. HbA1C:  Recent Labs  08/16/16 0828  HGBA1C 6.0*   CBG:  Recent Labs Lab 08/16/16 1625 08/16/16 1730 08/16/16 2206 08/17/16 0757 08/17/16 1235  GLUCAP 137* 139* 150* 134* 137*   Lipid Profile: No results for input(s): CHOL, HDL,  LDLCALC, TRIG, CHOLHDL, LDLDIRECT in the last 72 hours. Thyroid Function Tests:  Recent Labs  08/16/16 0828  TSH 0.461   Anemia Panel: No results for input(s): VITAMINB12, FOLATE, FERRITIN, TIBC, IRON, RETICCTPCT in the last 72 hours. Urine analysis:    Component Value Date/Time   COLORURINE YELLOW 05/22/2012 1127   APPEARANCEUR CLEAR 05/22/2012 1127   LABSPEC >1.030 (H) 05/22/2012 1127   PHURINE 5.5 05/22/2012 1127   GLUCOSEU NEGATIVE 05/22/2012 1127   HGBUR NEGATIVE 05/22/2012 1127   BILIRUBINUR NEGATIVE 05/22/2012 1127   KETONESUR NEGATIVE 05/22/2012 1127   PROTEINUR 30 (A) 05/22/2012 1127   UROBILINOGEN 0.2 05/22/2012 1127   NITRITE NEGATIVE 05/22/2012 1127   LEUKOCYTESUR NEGATIVE 05/22/2012 1127     ) Recent Results (from the past 240 hour(s))  Culture, blood (Routine X 2) w Reflex to ID Panel     Status: None (Preliminary result)   Collection Time: 08/16/16  8:20 AM  Result Value Ref Range Status   Specimen Description BLOOD LEFT HAND  Final   Special Requests   Final    BOTTLES DRAWN AEROBIC ONLY Blood Culture adequate volume   Culture NO GROWTH < 12 HOURS  Final   Report Status PENDING  Incomplete  Culture, blood (Routine X 2) w Reflex to ID Panel     Status: None (Preliminary result)   Collection Time: 08/16/16  8:28 AM  Result Value Ref Range Status   Specimen Description BLOOD LEFT ANTECUBITAL  Final   Special Requests   Final    BOTTLES DRAWN AEROBIC AND ANAEROBIC Blood Culture adequate volume   Culture NO GROWTH < 12 HOURS  Final   Report Status PENDING  Incomplete      Anti-infectives    Start     Dose/Rate Route Frequency Ordered Stop   08/16/16 0800  levofloxacin (LEVAQUIN) IVPB 750 mg  Status:  Discontinued     750 mg 100 mL/hr over 90 Minutes Intravenous Every 24 hours 08/16/16 0717 08/16/16 1013       Radiology Studies: X-ray Chest Pa And Lateral  Result Date: 08/17/2016 CLINICAL DATA:  51 year old female with history of shortness of  breath. EXAM: CHEST  2 VIEW COMPARISON:  Chest x-ray 08/16/2016. FINDINGS: There is cephalization of the pulmonary vasculature and slight indistinctness of the interstitial markings suggestive of mild pulmonary edema. Small amount of fluid in the minor fissure. Areas of subsegmental atelectasis in the periphery of the lingula. No pleural effusions. Mild cardiomegaly. Upper mediastinal contours are within normal limits. Aortic atherosclerosis. Status post median sternotomy for CABG. IMPRESSION: 1. Appearance the chest suggests mild congestive heart failure, as above. Electronically Signed  By: Trudie Reedaniel  Entrikin M.D.   On: 08/17/2016 07:39   Dg Chest 2 View  Result Date: 08/16/2016 CLINICAL DATA:  51 y/o  F; shortness of breath. EXAM: CHEST  2 VIEW COMPARISON:  08/05/2011 chest radiograph FINDINGS: Mild cardiomegaly and enlargement of pulmonary vasculature. Increase interstitial markings of the lungs. Stable linear opacity left lung base, probably scarring. No pleural effusion or pneumothorax. Post median sternotomy. Wires are aligned. No acute osseous abnormality is evident. IMPRESSION: Cardiomegaly and pulmonary vascular congestion. Enlargement of central pulmonary vasculature suggests pulmonary artery hypertension. Electronically Signed   By: Mitzi HansenLance  Furusawa-Stratton M.D.   On: 08/16/2016 01:00   Ct Angio Chest Pe W Or Wo Contrast  Addendum Date: 08/16/2016   ADDENDUM REPORT: 08/16/2016 09:17 ADDENDUM: Critical Value/emergent results were called by telephone at the time of interpretation on 08/16/2016 at 9:16 am to Nurse Wadie LessenAngela Moore, who verbally acknowledged these results. Electronically Signed   By: Signa Kellaylor  Stroud M.D.   On: 08/16/2016 09:17   Result Date: 08/16/2016 CLINICAL DATA:  Short of breath.  [place] from [place]. EXAM: CT ANGIOGRAPHY CHEST WITH CONTRAST TECHNIQUE: Multidetector CT imaging of the chest was performed using the standard protocol during bolus administration of intravenous  contrast. Multiplanar CT image reconstructions and MIPs were obtained to evaluate the vascular anatomy. CONTRAST:  76 cc of Isovue 370 COMPARISON:  07/29/2010 FINDINGS: Cardiovascular: Previous median sternotomy and CABG procedure. Moderate cardiac enlargement. Multifactorial diminished exam detail largely secondary to patient body habitus, respiratory motion artifact and suboptimal pulmonary arterial opacification. There is a suspicious filling defect lateral branch of right lower lobe pulmonary artery, image number 80 of series 10 which is worrisome for acute pulmonary embolus. The main pulmonary artery appears patent. No saddle embolus. Right and left pulmonary arteries appear prominent suggestive of PA hypertension. Mediastinum/Nodes: The trachea appears patent and is midline. Normal appearance of the esophagus. No mediastinal or hilar adenopathy. No axillary or supraclavicular adenopathy. Lungs/Pleura: Bibasilar subsegmental atelectasis atelectasis is identified bilaterally. Subsegmental atelectasis is identified within both lower lobes. Upper Abdomen: No acute abnormality. Musculoskeletal: No aggressive lytic or sclerotic bone lesions identified. Review of the MIP images confirms the above findings. IMPRESSION: 1. Diminished exam detail secondary to body habitus, respiratory motion artifact and suboptimal pulmonary arterial opacification. 2. Suggestion of pulmonary artery filling defect involving a lateral segment branch of the right lower lobe pulmonary artery. Cannot rule out acute pulmonary embolus. 3. Bibasilar subsegmental atelectasis Electronically Signed: By: Signa Kellaylor  Stroud M.D. On: 08/16/2016 08:41        Scheduled Meds: . aspirin EC  81 mg Oral Daily  . enoxaparin (LOVENOX) injection  135 mg Subcutaneous Q12H  . guaiFENesin  600 mg Oral BID  . insulin aspart  0-9 Units Subcutaneous TID WC  . ipratropium-albuterol  3 mL Nebulization TID  . levothyroxine  224 mcg Oral QAC breakfast  .  methylPREDNISolone sodium succinate  60 mg Intravenous Q12H  . mycophenolate  1,000 mg Oral BID  . pantoprazole  40 mg Oral Daily  . PARoxetine  20 mg Oral Daily  . PARoxetine  30 mg Oral Daily  . pyridostigmine  30 mg Oral QID   Continuous Infusions:   LOS: 1 day    Time spent: 35 min    JESSICA U VANN, DO [facility] Pager [phone]  If 7PM-7AM, please contact night-coverage [email or web address] Password [email or web address] 08/17/2016, 1:01 PM

## 2016-08-17 NOTE — Progress Notes (Signed)
ANTICOAGULATION CONSULT NOTE  Pharmacy Consult for Lovenox to Xarelto  Indication: pulmonary embolus  No Known Allergies  Patient Measurements: Height: 5\' 1"  (154.9 cm) Weight: 300 lb (136.1 kg) IBW/kg (Calculated) : 47.8  Vital Signs: Temp: 98.4 F (36.9 C) (07/30 0535) BP: 157/77 (07/30 0535) Pulse Rate: 85 (07/30 0535)  Labs:  Recent Labs  08/16/16 0223 08/17/16 0500  HGB 9.4* 9.8*  HCT 31.6* 34.0*  PLT 231 237  LABPROT  --  14.8  INR  --  1.16  CREATININE 0.67 0.63    Estimated Creatinine Clearance: 109.1 mL/min (by C-G formula based on SCr of 0.63 mg/dL).   Medical History: Past Medical History:  Diagnosis Date  . Asthma   . Myasthenia gravis [facility](HCC)     Assessment: 51 yo female with acute PE. Currently on Lovenox for treatment but to transition to Xarelto for continued treatment. Last dose of Lovenox was at 11:30 this am. CBC stable.   Goal of Therapy:  Monitor platelets by anticoagulation protocol: Yes   Plan:  1. Xarelto 15 mg BID for 21 days followed by 20 mg daily thereafter; first dose later this evening since received treatment dose of Lovenox this am.   Pollyann SamplesAndy Jazminn Pomales, PharmD, BCPS 08/17/2016, 1:43 PM

## 2016-08-17 NOTE — Progress Notes (Signed)
  Echocardiogram 2D Echocardiogram has been performed.  Juell Radney G Jaquavis Felmlee 08/17/2016, 2:40 PM

## 2016-08-17 NOTE — Progress Notes (Signed)
**  Preliminary report by tech**  Bilateral lower extremity venous duplex completed. There is no evidence of deep or superficial vein thrombosis involving the right and left lower extremities. All visualized vessels appear patent and compressible. There is no evidence of Baker's cysts bilaterally.  08/17/16 4:03 PM Olen CordialGreg Chyenne Sobczak RVT

## 2016-08-17 NOTE — Progress Notes (Signed)
MEDICATION RELATED CONSULT NOTE  No Known Allergies   Pharmacy consulted to reconcile patient's home dose of mestinon. I clarified with patient and reviewed her med bottle located in the room that she takes mestinon 30mg  (1/2 of a 60mg  tablet) every 4 hours equaling 5 times a day.   I have corrected the pta med list and current in-patient entry.   Patient also educated on xarelto which she will start tonight.  Sheppard CoilFrank Tiger Spieker PharmD., BCPS Clinical Pharmacist Pager [phone] 08/17/2016 9:18 PM

## 2016-08-18 DIAGNOSIS — G4730 Sleep apnea, unspecified: Secondary | ICD-10-CM

## 2016-08-18 LAB — PROTEIN S ACTIVITY: Protein S Activity: 78 % (ref 63–140)

## 2016-08-18 LAB — LUPUS ANTICOAGULANT PANEL
DRVVT: 45.8 s (ref 0.0–47.0)
PTT Lupus Anticoagulant: 51.6 s (ref 0.0–51.9)

## 2016-08-18 LAB — GLUCOSE, CAPILLARY
GLUCOSE-CAPILLARY: 108 mg/dL — AB (ref 65–99)
Glucose-Capillary: 122 mg/dL — ABNORMAL HIGH (ref 65–99)
Glucose-Capillary: 126 mg/dL — ABNORMAL HIGH (ref 65–99)
Glucose-Capillary: 130 mg/dL — ABNORMAL HIGH (ref 65–99)

## 2016-08-18 LAB — CARDIOLIPIN ANTIBODIES, IGG, IGM, IGA: Anticardiolipin IgG: 15 GPL U/mL — ABNORMAL HIGH (ref 0–14)

## 2016-08-18 LAB — BETA-2-GLYCOPROTEIN I ABS, IGG/M/A: Beta-2 Glyco I IgG: <9 GPI IgG units (ref 0–20)

## 2016-08-18 LAB — PROTEIN C ACTIVITY: PROTEIN C ACTIVITY: 116 % (ref 73–180)

## 2016-08-18 LAB — PROTEIN C, TOTAL: PROTEIN C, TOTAL: 96 % (ref 60–150)

## 2016-08-18 LAB — PROTEIN S, TOTAL: Protein S Ag, Total: 85 % (ref 60–150)

## 2016-08-18 MED ORDER — PREDNISONE 20 MG PO TABS
40.0000 mg | ORAL_TABLET | Freq: Every day | ORAL | Status: DC
  Administered 2016-08-19: 40 mg via ORAL
  Filled 2016-08-18: qty 2

## 2016-08-18 NOTE — Progress Notes (Signed)
NIF -40 cmh20 and VC 1.4 L/min.  Excellent effort

## 2016-08-18 NOTE — Progress Notes (Signed)
MEDICATION RELATED CONSULT NOTE   Pharmacy asked to speak with pt in regards to the timing of administration of medications. Spoke with pt and adjusted timing of medications where feasible to be more in line with her home dosing as well go over why Synthroid and Xarelto deviate from the normal 10:00 scheduled administration time. Questions answered and provided reeducation on Xarelto.      Pollyann SamplesAndy Kipton Skillen, PharmD, BCPS 08/18/2016, 9:32 AM

## 2016-08-18 NOTE — Progress Notes (Signed)
PROGRESS NOTE    [name]  RUE:[id number] DOB: 06-24-1965 DOA: 08/16/2016 PCP: Default, Provider, MD   Outpatient Specialists:     Brief Narrative:  [name] is a 51 y.o. female with a Past Medical History significant for myasthenia gravis, hypothyroidism, asthma who presents with shortness of breath after plane ride. CTA shows PE. Started on therapeutic dose of Lovenox. Lower extremity Dopplers ordered. Echo ordered. Patient still very dyspneic. Better after steroids and breathing treatments so will continue. Hypercoag panel ordered.   Assessment & Plan:   Active Problems:   Asthma exacerbation   GERD (gastroesophageal reflux disease)   Depression   Anxiety   Myasthenia gravis (HCC)   Sleep apnea   Acute respiratory failure with  New episode of hypoxia due to asthma exacerbation  IV steroids- change to PO abx nebs O2 continuous -- wean as tolerated CT angio of the chest r/o PE : SQ lovenox-- will change to xarelto PO Lower extremity duplex negative -echo: Left ventricle: The cavity size was mildly dilated. Wall   thickness was increased in a pattern of mild LVH. Systolic   function was normal. The estimated ejection fraction was in the   range of 60% to 65%. Wall motion was normal; there were no   regional wall motion abnormalities. Features are consistent with   a pseudonormal left ventricular filling pattern, with concomitant   abnormal relaxation and increased filling pressure (grade 2   diastolic dysfunction).  OSA  CPAP nightly-- patient does not have at home-- had sleep study in 2016 with severe apnea  Myasthenia Gravis -resume home meds -NIF/FVC  Hypothyroidism: Continue home Synthroid TSH normal  Anxiety/ Depression Continue home Paxil 50 mg daily  Hypertension Add ACE -? From untreated sleep apnea  DVT prophylaxis:  Fully anticoagulated   Code Status: Full Code   Family Communication: At bedside  Disposition  Plan:     Consultants:       Subjective: Breathing improved today  Objective: Vitals:   08/17/16 2330 08/18/16 0643 08/18/16 0726 08/18/16 1016  BP: (!) 165/56 106/72  139/78  Pulse: 75 67    Resp: 18 18    Temp: 98.9 F (37.2 C) 98 F (36.7 C)    TempSrc: Oral Oral    SpO2: 91% 93% 94%   Weight:  136.1 kg (300 lb)    Height:        Intake/Output Summary (Last 24 hours) at 08/18/16 1440 Last data filed at 08/18/16 0900  Gross per 24 hour  Intake              480 ml  Output                0 ml  Net              480 ml   Filed Weights   08/16/16 0900 08/17/16 0500 08/18/16 0643  Weight: (!) 137.2 kg (302 lb 6.4 oz) 136.1 kg (300 lb) 136.1 kg (300 lb)    Examination:  General exam: obese- able to speak in complete sentences Respiratory system: moving more air with limited wheezing Cardiovascular system: S1 & S2 heard, RRR. No JVD, murmurs, rubs, gallops or clicks. Min edema Gastrointestinal system: Abdomen is obese, soft and nontender. No organomegaly or masses felt. Normal bowel sounds heard. Central nervous system: A+Ox3, NAD Extremities: Symmetric 5 x 5 power.     Data Reviewed: I have personally reviewed following labs and imaging studies  CBC:  Recent  Labs Lab 08/16/16 0223 08/17/16 0500  WBC 10.9* 13.8*  NEUTROABS 7.8*  --   HGB 9.4* 9.8*  HCT 31.6* 34.0*  MCV 79.6 80.0  PLT 231 237   Basic Metabolic Panel:  Recent Labs Lab 08/16/16 0223 08/17/16 0500  NA 141 140  K 3.5 4.7  CL 104 104  CO2 31 29  GLUCOSE 110* 161*  BUN 5* 9  CREATININE 0.67 0.63  CALCIUM 8.8* 9.0   GFR: Estimated Creatinine Clearance: 109.1 mL/min (by C-G formula based on SCr of 0.63 mg/dL). Liver Function Tests:  Recent Labs Lab 08/17/16 0500  AST 18  ALT 16  ALKPHOS 58  BILITOT 0.5  PROT 7.5  ALBUMIN 3.3*   No results for input(s): LIPASE, AMYLASE in the last 168 hours. No results for input(s): AMMONIA in the last 168 hours. Coagulation  Profile:  Recent Labs Lab 08/17/16 0500  INR 1.16   Cardiac Enzymes: No results for input(s): CKTOTAL, CKMB, CKMBINDEX, TROPONINI in the last 168 hours. BNP (last 3 results) No results for input(s): PROBNP in the last 8760 hours. HbA1C:  Recent Labs  08/16/16 0828  HGBA1C 6.0*   CBG:  Recent Labs Lab 08/17/16 1235 08/17/16 1631 08/17/16 2207 08/18/16 0805 08/18/16 1208  GLUCAP 137* 128* 106* 122* 108*   Lipid Profile: No results for input(s): CHOL, HDL, LDLCALC, TRIG, CHOLHDL, LDLDIRECT in the last 72 hours. Thyroid Function Tests:  Recent Labs  08/16/16 0828  TSH 0.461   Anemia Panel: No results for input(s): VITAMINB12, FOLATE, FERRITIN, TIBC, IRON, RETICCTPCT in the last 72 hours. Urine analysis:    Component Value Date/Time   COLORURINE YELLOW 05/22/2012 1127   APPEARANCEUR CLEAR 05/22/2012 1127   LABSPEC >1.030 (H) 05/22/2012 1127   PHURINE 5.5 05/22/2012 1127   GLUCOSEU NEGATIVE 05/22/2012 1127   HGBUR NEGATIVE 05/22/2012 1127   BILIRUBINUR NEGATIVE 05/22/2012 1127   KETONESUR NEGATIVE 05/22/2012 1127   PROTEINUR 30 (A) 05/22/2012 1127   UROBILINOGEN 0.2 05/22/2012 1127   NITRITE NEGATIVE 05/22/2012 1127   LEUKOCYTESUR NEGATIVE 05/22/2012 1127      Recent Results (from the past 240 hour(s))  Culture, blood (Routine X 2) w Reflex to ID Panel     Status: None (Preliminary result)   Collection Time: 08/16/16  8:20 AM  Result Value Ref Range Status   Specimen Description BLOOD LEFT HAND  Final   Special Requests   Final    BOTTLES DRAWN AEROBIC ONLY Blood Culture adequate volume   Culture NO GROWTH 1 DAY  Final   Report Status PENDING  Incomplete  Culture, blood (Routine X 2) w Reflex to ID Panel     Status: None (Preliminary result)   Collection Time: 08/16/16  8:28 AM  Result Value Ref Range Status   Specimen Description BLOOD LEFT ANTECUBITAL  Final   Special Requests   Final    BOTTLES DRAWN AEROBIC AND ANAEROBIC Blood Culture adequate  volume   Culture NO GROWTH 1 DAY  Final   Report Status PENDING  Incomplete      Anti-infectives    Start     Dose/Rate Route Frequency Ordered Stop   08/16/16 0800  levofloxacin (LEVAQUIN) IVPB 750 mg  Status:  Discontinued     750 mg 100 mL/hr over 90 Minutes Intravenous Every 24 hours 08/16/16 0717 08/16/16 1013       Radiology Studies: X-ray Chest Pa And Lateral  Result Date: 08/17/2016 CLINICAL DATA:  51 year old female with history of shortness of breath. EXAM:  CHEST  2 VIEW COMPARISON:  Chest x-ray 08/16/2016. FINDINGS: There is cephalization of the pulmonary vasculature and slight indistinctness of the interstitial markings suggestive of mild pulmonary edema. Small amount of fluid in the minor fissure. Areas of subsegmental atelectasis in the periphery of the lingula. No pleural effusions. Mild cardiomegaly. Upper mediastinal contours are within normal limits. Aortic atherosclerosis. Status post median sternotomy for CABG. IMPRESSION: 1. Appearance the chest suggests mild congestive heart failure, as above. Electronically Signed   By: Trudie Reedaniel  Entrikin M.D.   On: 08/17/2016 07:39        Scheduled Meds: . aspirin EC  81 mg Oral Daily  . guaiFENesin  600 mg Oral BID  . insulin aspart  0-9 Units Subcutaneous TID WC  . ipratropium-albuterol  3 mL Nebulization TID  . levothyroxine  224 mcg Oral QAC breakfast  . lisinopril  10 mg Oral Daily  . methylPREDNISolone sodium succinate  60 mg Intravenous Q12H  . mycophenolate  1,000 mg Oral BID  . pantoprazole  40 mg Oral Daily  . PARoxetine  50 mg Oral Daily  . pyridostigmine  30 mg Oral 5 X Daily  . Rivaroxaban  15 mg Oral BID WC  . [START ON 09/08/2016] Rivaroxaban  20 mg Oral Q supper   Continuous Infusions:   LOS: 2 days    Time spent: 35 min    Zeriah Baysinger U Jezreel Justiniano, DO [facility] Pager [phone]  If 7PM-7AM, please contact night-coverage [email or web address] Password [email or web address] 08/18/2016, 2:40 PM

## 2016-08-18 NOTE — Progress Notes (Signed)
Pt is on NIV at this time toelrating it well. Settings adjusted per patient comfort. Pt states that she does not wear a CPAP mask at home but she wants information on going about getting one for home. Pt is stable at this time no distress or complications noted

## 2016-08-18 NOTE — Progress Notes (Signed)
Pulmonary mechanics with good effort  NIF: >-60 VC: 1.2L  No distress or complications noted

## 2016-08-19 DIAGNOSIS — I2699 Other pulmonary embolism without acute cor pulmonale: Secondary | ICD-10-CM

## 2016-08-19 LAB — GLUCOSE, CAPILLARY
GLUCOSE-CAPILLARY: 110 mg/dL — AB (ref 65–99)
GLUCOSE-CAPILLARY: 110 mg/dL — AB (ref 65–99)
Glucose-Capillary: 121 mg/dL — ABNORMAL HIGH (ref 65–99)

## 2016-08-19 MED ORDER — FUROSEMIDE 10 MG/ML IJ SOLN
20.0000 mg | Freq: Once | INTRAMUSCULAR | Status: AC
  Administered 2016-08-19: 20 mg via INTRAVENOUS
  Filled 2016-08-19: qty 2

## 2016-08-19 MED ORDER — IPRATROPIUM-ALBUTEROL 0.5-2.5 (3) MG/3ML IN SOLN: 3.0000 mL | Freq: Three times a day (TID) | RESPIRATORY_TRACT | 0 refills | Status: DC | PRN

## 2016-08-19 MED ORDER — POTASSIUM CHLORIDE ER 10 MEQ PO TBCR: 10.0000 meq | EXTENDED_RELEASE_TABLET | Freq: Every day | ORAL | 0 refills | Status: DC

## 2016-08-19 MED ORDER — LISINOPRIL 10 MG PO TABS: 10.0000 mg | ORAL_TABLET | Freq: Every day | ORAL | 0 refills | Status: DC

## 2016-08-19 MED ORDER — PREDNISONE 20 MG PO TABS: 40.0000 mg | ORAL_TABLET | Freq: Every day | ORAL | 0 refills | Status: AC

## 2016-08-19 MED ORDER — RIVAROXABAN (XARELTO) VTE STARTER PACK (15 & 20 MG): ORAL_TABLET | ORAL | 0 refills | Status: DC

## 2016-08-19 MED ORDER — FUROSEMIDE 20 MG PO TABS: 20.0000 mg | ORAL_TABLET | Freq: Every day | ORAL | 0 refills | Status: DC

## 2016-08-19 NOTE — Discharge Summary (Signed)
Physician Discharge Summary  Barbara Heysatricia D Barcelo ZOX:[id number] DOB: 03-31-1965 DOA: 08/16/2016  PCP: Orland Mustardietz, Ashley M, MD  Admit date: 08/16/2016 Discharge date: 08/19/2016   Recommendations for Outpatient Follow-Up:   Outpatient cardiology referral for aortic regurgitation outpatient CPAP-- had study in 2016- never followed up Home nebulizer/home O2 BMP/CBC Patient to bring in log of daily weights   Discharge Diagnosis:   Active Problems:   Asthma exacerbation   GERD (gastroesophageal reflux disease)   Depression   Anxiety   Myasthenia gravis (HCC)   Sleep apnea   Pulmonary emboli [facility](HCC)   Discharge disposition:  Home:  Discharge Condition: Improved.  Diet recommendation: Low sodium, heart healthy.  Carbohydrate-modified  Wound care: None.   History of Present Illness:   Per Dr. Melynda RippleHobbs: [name] is a 51 y.o. female with a Past Medical History significant for myasthenia gravis, hypothyroidism, asthma who presents with shortness of breath after plane ride. CTA shows PE. Started on therapeutic dose of Lovenox. Lower extremity Dopplers ordered. Echo ordered. Patient still very dyspneic. Better after steroids and breathing treatments so will continue. Hypercoag panel ordered.    Hospital Course by Problem:   pulm emboli -xarelto -PCP follow up -suspect due to long plane ride/obesity  Morbid obesity -encourage weight loss  Acute respiratory failure with New episode of hypoxia due to asthma exacerbation and acute diastolic HF (grade 2) PO steroids x 5 days nebs O2 continuous -- wean as tolerated- suspect patient has some volume overload despite BNP being normal.  Patinet ismorbidly obese and volume status is difficult to assess.  Lasix 20 mg IV given x1 and has diuresed 1.6L CT angio of the chest r/o PE : SQ lovenox-- will change to xarelto PO for d/c Lower extremity duplex negative  OSA CPAP nightly-- patient does not have at home-- had sleep study in  2016 with severe apnea -will need PCP to arrange CPAP  Myasthenia Gravis -resume home meds  Hypothyroidism: Continue home Synthroid TSH normal  Anxiety/ Depression Continue home Paxil 50 mg daily  Hypertension Add ACE -?from untreated sleep apnea -close PCP follow up  Medical Consultants:    None.   Discharge Exam:   Vitals:   08/19/16 0546 08/19/16 1602  BP: 140/62 (!) 161/76  Pulse: 76 87  Resp: 19 16  Temp: 97.7 F (36.5 C) 98.7 F (37.1 C)   Vitals:   08/19/16 0546 08/19/16 0755 08/19/16 1430 08/19/16 1602  BP: 140/62   (!) 161/76  Pulse: 76   87  Resp: 19   16  Temp: 97.7 F (36.5 C)   98.7 F (37.1 C)  TempSrc: Oral   Oral  SpO2: 94% 97% 98% 95%  Weight: (!) 137 kg (302 lb 1.6 oz)     Height:        Gen:  NAD-- feeling better, anxious to go home   The results of significant diagnostics from this hospitalization (including imaging, microbiology, ancillary and laboratory) are listed below for reference.     Procedures and Diagnostic Studies:   X-ray Chest Pa And Lateral  Result Date: 08/17/2016 CLINICAL DATA:  51 year old female with history of shortness of breath. EXAM: CHEST  2 VIEW COMPARISON:  Chest x-ray 08/16/2016. FINDINGS: There is cephalization of the pulmonary vasculature and slight indistinctness of the interstitial markings suggestive of mild pulmonary edema. Small amount of fluid in the minor fissure. Areas of subsegmental atelectasis in the periphery of the lingula. No pleural effusions. Mild cardiomegaly. Upper mediastinal contours are within  normal limits. Aortic atherosclerosis. Status post median sternotomy for CABG. IMPRESSION: 1. Appearance the chest suggests mild congestive heart failure, as above. Electronically Signed   By: Trudie Reedaniel  Entrikin M.D.   On: 08/17/2016 07:39   Dg Chest 2 View  Result Date: 08/16/2016 CLINICAL DATA:  51 y/o  F; shortness of breath. EXAM: CHEST  2 VIEW COMPARISON:  08/05/2011 chest radiograph  FINDINGS: Mild cardiomegaly and enlargement of pulmonary vasculature. Increase interstitial markings of the lungs. Stable linear opacity left lung base, probably scarring. No pleural effusion or pneumothorax. Post median sternotomy. Wires are aligned. No acute osseous abnormality is evident. IMPRESSION: Cardiomegaly and pulmonary vascular congestion. Enlargement of central pulmonary vasculature suggests pulmonary artery hypertension. Electronically Signed   By: Mitzi HansenLance  Furusawa-Stratton M.D.   On: 08/16/2016 01:00   Ct Angio Chest Pe W Or Wo Contrast  Addendum Date: 08/16/2016   ADDENDUM REPORT: 08/16/2016 09:17 ADDENDUM: Critical Value/emergent results were called by telephone at the time of interpretation on 08/16/2016 at 9:16 am to Nurse Wadie LessenAngela Moore, who verbally acknowledged these results. Electronically Signed   By: Signa Kellaylor  Stroud M.D.   On: 08/16/2016 09:17   Result Date: 08/16/2016 CLINICAL DATA:  Short of breath.  [place]. EXAM: CT ANGIOGRAPHY CHEST WITH CONTRAST TECHNIQUE: Multidetector CT imaging of the chest was performed using the standard protocol during bolus administration of intravenous contrast. Multiplanar CT image reconstructions and MIPs were obtained to evaluate the vascular anatomy. CONTRAST:  76 cc of Isovue 370 COMPARISON:  07/29/2010 FINDINGS: Cardiovascular: Previous median sternotomy and CABG procedure. Moderate cardiac enlargement. Multifactorial diminished exam detail largely secondary to patient body habitus, respiratory motion artifact and suboptimal pulmonary arterial opacification. There is a suspicious filling defect lateral branch of right lower lobe pulmonary artery, image number 80 of series 10 which is worrisome for acute pulmonary embolus. The main pulmonary artery appears patent. No saddle embolus. Right and left pulmonary arteries appear prominent suggestive of PA hypertension. Mediastinum/Nodes: The trachea appears patent and is midline. Normal  appearance of the esophagus. No mediastinal or hilar adenopathy. No axillary or supraclavicular adenopathy. Lungs/Pleura: Bibasilar subsegmental atelectasis atelectasis is identified bilaterally. Subsegmental atelectasis is identified within both lower lobes. Upper Abdomen: No acute abnormality. Musculoskeletal: No aggressive lytic or sclerotic bone lesions identified. Review of the MIP images confirms the above findings. IMPRESSION: 1. Diminished exam detail secondary to body habitus, respiratory motion artifact and suboptimal pulmonary arterial opacification. 2. Suggestion of pulmonary artery filling defect involving a lateral segment branch of the right lower lobe pulmonary artery. Cannot rule out acute pulmonary embolus. 3. Bibasilar subsegmental atelectasis Electronically Signed: By: Signa Kellaylor  Stroud M.D. On: 08/16/2016 08:41   Echo: Study Conclusions  - Left ventricle: The cavity size was mildly dilated. Wall   thickness was increased in a pattern of mild LVH. Systolic   function was normal. The estimated ejection fraction was in the   range of 60% to 65%. Wall motion was normal; there were no   regional wall motion abnormalities. Features are consistent with   a pseudonormal left ventricular filling pattern, with concomitant   abnormal relaxation and increased filling pressure (grade 2   diastolic dysfunction). - Aortic valve: There was severe regurgitation. - Mitral valve: There was mild regurgitation. - Left atrium: The atrium was severely dilated. - Right ventricle: The cavity size was normal. Wall thickness was   normal. Systolic function was normal. - Right atrium: The atrium was mildly dilated.   Labs:   Basic Metabolic Panel:  Recent Labs Lab 08/16/16 0223 08/17/16 0500  NA 141 140  K 3.5 4.7  CL 104 104  CO2 31 29  GLUCOSE 110* 161*  BUN 5* 9  CREATININE 0.67 0.63  CALCIUM 8.8* 9.0   GFR Estimated Creatinine Clearance: 109.7 mL/min (by C-G formula based on SCr of  0.63 mg/dL). Liver Function Tests:  Recent Labs Lab 08/17/16 0500  AST 18  ALT 16  ALKPHOS 58  BILITOT 0.5  PROT 7.5  ALBUMIN 3.3*   No results for input(s): LIPASE, AMYLASE in the last 168 hours. No results for input(s): AMMONIA in the last 168 hours. Coagulation profile  Recent Labs Lab 08/17/16 0500  INR 1.16    CBC:  Recent Labs Lab 08/16/16 0223 08/17/16 0500  WBC 10.9* 13.8*  NEUTROABS 7.8*  --   HGB 9.4* 9.8*  HCT 31.6* 34.0*  MCV 79.6 80.0  PLT 231 237   Cardiac Enzymes: No results for input(s): CKTOTAL, CKMB, CKMBINDEX, TROPONINI in the last 168 hours. BNP: Invalid input(s): POCBNP CBG:  Recent Labs Lab 08/18/16 1651 08/18/16 2116 08/19/16 0744 08/19/16 1215 08/19/16 1717  GLUCAP 126* 130* 121* 110* 110*   D-Dimer No results for input(s): DDIMER in the last 72 hours. Hgb A1c No results for input(s): HGBA1C in the last 72 hours. Lipid Profile No results for input(s): CHOL, HDL, LDLCALC, TRIG, CHOLHDL, LDLDIRECT in the last 72 hours. Thyroid function studies No results for input(s): TSH, T4TOTAL, T3FREE, THYROIDAB in the last 72 hours.  Invalid input(s): FREET3 Anemia work up No results for input(s): VITAMINB12, FOLATE, FERRITIN, TIBC, IRON, RETICCTPCT in the last 72 hours. Microbiology Recent Results (from the past 240 hour(s))  Culture, blood (Routine X 2) w Reflex to ID Panel     Status: None (Preliminary result)   Collection Time: 08/16/16  8:20 AM  Result Value Ref Range Status   Specimen Description BLOOD LEFT HAND  Final   Special Requests   Final    BOTTLES DRAWN AEROBIC ONLY Blood Culture adequate volume   Culture NO GROWTH 3 DAYS  Final   Report Status PENDING  Incomplete  Culture, blood (Routine X 2) w Reflex to ID Panel     Status: None (Preliminary result)   Collection Time: 08/16/16  8:28 AM  Result Value Ref Range Status   Specimen Description BLOOD LEFT ANTECUBITAL  Final   Special Requests   Final    BOTTLES DRAWN  AEROBIC AND ANAEROBIC Blood Culture adequate volume   Culture NO GROWTH 3 DAYS  Final   Report Status PENDING  Incomplete     Discharge Instructions:   Discharge Instructions    Diet - low sodium heart healthy    Complete by:  As directed    Discharge instructions    Complete by:  As directed    Weigh self daily at the same time every day and bring log to PCP BMP/CBC 1 week CPAP by PCP   Increase activity slowly    Complete by:  As directed      Allergies as of 08/19/2016   No Known Allergies     Medication List    STOP taking these medications   diphenoxylate-atropine 2.5-0.025 MG tablet Commonly known as:  LOMOTIL   promethazine 25 MG tablet Commonly known as:  PHENERGAN     TAKE these medications   fluticasone 50 MCG/ACT nasal spray Commonly known as:  FLONASE Place 1 spray into both nostrils daily.   furosemide 20 MG tablet Commonly known  as:  LASIX Take 1 tablet (20 mg total) by mouth daily.   ipratropium-albuterol 0.5-2.5 (3) MG/3ML Soln Commonly known as:  DUONEB Take 3 mLs by nebulization 3 (three) times daily as needed (wheezing).   levothyroxine 112 MCG tablet Commonly known as:  SYNTHROID, LEVOTHROID Take 224 mcg by mouth daily.   lisinopril 10 MG tablet Commonly known as:  PRINIVIL,ZESTRIL Take 1 tablet (10 mg total) by mouth daily.   mycophenolate 500 MG tablet Commonly known as:  CELLCEPT Take 1,000 mg by mouth 2 (two) times daily.   omeprazole 20 MG capsule Commonly known as:  PRILOSEC Take 20 mg by mouth 2 (two) times daily.   PARoxetine 20 MG tablet Commonly known as:  PAXIL Take 20 mg by mouth daily. Take with paxil 30 mg to equal 50 mg   PARoxetine 30 MG tablet Commonly known as:  PAXIL Take 30 mg by mouth daily. Take with paxil 20 mg to equal 50 mg   potassium chloride 10 MEQ tablet Commonly known as:  K-DUR Take 1 tablet (10 mEq total) by mouth daily.   predniSONE 20 MG tablet Commonly known as:  DELTASONE Take 2 tablets  (40 mg total) by mouth daily with breakfast.   pyridostigmine 60 MG tablet Commonly known as:  MESTINON Take 30 mg by mouth 5 (five) times daily.   Rivaroxaban 15 & 20 MG Tbpk Take as directed on package: Start with one 15mg  tablet by mouth twice a day with food. On Day 22, switch to one 20mg  tablet once a day with food.   VENTOLIN HFA 108 (90 Base) MCG/ACT inhaler Generic drug:  albuterol Inhale 2 puffs into the lungs every 4 (four) hours as needed for wheezing or shortness of breath.            Durable Medical Equipment        Start     Ordered   08/19/16 1143  For home use only DME continuous positive airway pressure (CPAP)  Once    Question Answer Comment  Patient has OSA or probable OSA Yes   Is the patient currently using CPAP in the home No   Settings Autotitration   CPAP supplies needed Mask, headgear, cushions, filters, heated tubing and water chamber      08/19/16 1142   08/19/16 1143  For home use only DME Nebulizer machine  Once    Question:  Patient needs a nebulizer to treat with the following condition  Answer:  Asthma   08/19/16 1142   08/19/16 1143  For home use only DME oxygen  Once    Question Answer Comment  Mode or (Route) Nasal cannula   Liters per Minute 2   Frequency Continuous (stationary and portable oxygen unit needed)   Oxygen delivery system Gas      08/19/16 1142     Follow-up Information    [facility] - Dme Follow up.   Why:  Home oxygen arranged, nebulizer will be delivered to bedside prior to discharge Contact information: [address] [place] [address] [phone]        Orland Mustard, MD Follow up in 1 week(s).   Specialty:  Family Medicine Contact information: [id number] [facility] [place] [address] [phone]            Time coordinating discharge: 35 min  Signed:  Monterius Rolf Juanetta Gosling   [facility] 08/19/2016, 5:48 PM

## 2016-08-19 NOTE — Progress Notes (Signed)
NIF and VC performed with good effort.   NIF greater than -40cmH2o  VC 0.5L

## 2016-08-19 NOTE — Progress Notes (Signed)
SATURATION QUALIFICATIONS: (This note is used to comply with regulatory documentation for home oxygen)  AFTER LASIX GIVEN   Patient Saturations on Room Air at Rest = 92%  Patient Saturations on Room Air while Ambulating = 86%  Patient Saturations on 2 Liters of oxygen while Ambulating = 98%

## 2016-08-19 NOTE — Progress Notes (Signed)
[name] to be D/C'd Home per MD order.  Discussed with the patient and all questions fully answered.  VSS, Skin clean, dry and intact without evidence of skin break down, no evidence of skin tears noted. IV catheter discontinued intact. Site without signs and symptoms of complications. Dressing and pressure applied.  An After Visit Summary was printed and given to the patient. Patient received prescription.  D/c education completed with patient/family including follow up instructions, medication list, d/c activities limitations if indicated, with other d/c instructions as indicated by MD - patient able to verbalize understanding, all questions fully answered.   Patient instructed to return to ED, call 911, or call MD for any changes in condition.   Patient escorted via WC, and D/C home via private auto. Allergies as of 08/19/2016   No Known Allergies     Medication List    STOP taking these medications   diphenoxylate-atropine 2.5-0.025 MG tablet Commonly known as:  LOMOTIL   promethazine 25 MG tablet Commonly known as:  PHENERGAN     TAKE these medications   fluticasone 50 MCG/ACT nasal spray Commonly known as:  FLONASE Place 1 spray into both nostrils daily.   furosemide 20 MG tablet Commonly known as:  LASIX Take 1 tablet (20 mg total) by mouth daily.   ipratropium-albuterol 0.5-2.5 (3) MG/3ML Soln Commonly known as:  DUONEB Take 3 mLs by nebulization 3 (three) times daily as needed (wheezing).   levothyroxine 112 MCG tablet Commonly known as:  SYNTHROID, LEVOTHROID Take 224 mcg by mouth daily.   lisinopril 10 MG tablet Commonly known as:  PRINIVIL,ZESTRIL Take 1 tablet (10 mg total) by mouth daily.   mycophenolate 500 MG tablet Commonly known as:  CELLCEPT Take 1,000 mg by mouth 2 (two) times daily.   omeprazole 20 MG capsule Commonly known as:  PRILOSEC Take 20 mg by mouth 2 (two) times daily.   PARoxetine 20 MG tablet Commonly known as:   PAXIL Take 20 mg by mouth daily. Take with paxil 30 mg to equal 50 mg   PARoxetine 30 MG tablet Commonly known as:  PAXIL Take 30 mg by mouth daily. Take with paxil 20 mg to equal 50 mg   potassium chloride 10 MEQ tablet Commonly known as:  K-DUR Take 1 tablet (10 mEq total) by mouth daily.   predniSONE 20 MG tablet Commonly known as:  DELTASONE Take 2 tablets (40 mg total) by mouth daily with breakfast.   pyridostigmine 60 MG tablet Commonly known as:  MESTINON Take 30 mg by mouth 5 (five) times daily.   Rivaroxaban 15 & 20 MG Tbpk Take as directed on package: Start with one 15mg  tablet by mouth twice a day with food. On Day 22, switch to one 20mg  tablet once a day with food.   VENTOLIN HFA 108 (90 Base) MCG/ACT inhaler Generic drug:  albuterol Inhale 2 puffs into the lungs every 4 (four) hours as needed for wheezing or shortness of breath.            Durable Medical Equipment        Start     Ordered   08/19/16 1143  For home use only DME continuous positive airway pressure (CPAP)  Once    Question Answer Comment  Patient has OSA or probable OSA Yes   Is the patient currently using CPAP in the home No   Settings Autotitration   CPAP supplies needed Mask, headgear, cushions, filters, heated tubing and water chamber  08/19/16 1142   08/19/16 1143  For home use only DME Nebulizer machine  Once    Question:  Patient needs a nebulizer to treat with the following condition  Answer:  Asthma   08/19/16 1142   08/19/16 1143  For home use only DME oxygen  Once    Question Answer Comment  Mode or (Route) Nasal cannula   Liters per Minute 2   Frequency Continuous (stationary and portable oxygen unit needed)   Oxygen delivery system Gas      08/19/16 1142     [name] 08/19/2016 6:57 PM

## 2016-08-19 NOTE — Progress Notes (Signed)
SATURATION QUALIFICATIONS: (This note is used to comply with regulatory documentation for home oxygen)  Patient Saturations on Room Air at Rest = 98%  Patient Saturations on Room Air while Ambulating = 78%  Patient Saturations on 2 Liters of oxygen while Ambulating = 92%

## 2016-08-21 LAB — CULTURE, BLOOD (ROUTINE X 2)
CULTURE: NO GROWTH
Culture: NO GROWTH
SPECIAL REQUESTS: ADEQUATE
Special Requests: ADEQUATE

## 2016-08-21 LAB — FACTOR 5 LEIDEN

## 2016-08-21 LAB — PROTHROMBIN GENE MUTATION

## 2016-08-23 ENCOUNTER — Emergency Department (HOSPITAL_COMMUNITY)
Admission: EM | Admit: 2016-08-23 | Discharge: 2016-08-23 | Disposition: A | Discharge status: Home / Self Care | Payer: Medicare Other | Attending: Emergency Medicine | Admitting: Emergency Medicine

## 2016-08-23 ENCOUNTER — Encounter (HOSPITAL_COMMUNITY): Payer: Self-pay | Admitting: *Deleted

## 2016-08-23 DIAGNOSIS — N939 Abnormal uterine and vaginal bleeding, unspecified: Secondary | ICD-10-CM | POA: Insufficient documentation

## 2016-08-23 DIAGNOSIS — Z79899 Other long term (current) drug therapy: Secondary | ICD-10-CM | POA: Insufficient documentation

## 2016-08-23 DIAGNOSIS — J45909 Unspecified asthma, uncomplicated: Secondary | ICD-10-CM | POA: Insufficient documentation

## 2016-08-23 DIAGNOSIS — Z87891 Personal history of nicotine dependence: Secondary | ICD-10-CM | POA: Diagnosis not present

## 2016-08-23 HISTORY — DX: Other pulmonary embolism without acute cor pulmonale: I26.99

## 2016-08-23 LAB — RAPID HIV SCREEN (HIV 1/2 AB+AG)
HIV 1/2 Antibodies: NONREACTIVE
HIV-1 P24 Antigen - HIV24: NONREACTIVE

## 2016-08-23 LAB — URINALYSIS, ROUTINE W REFLEX MICROSCOPIC
BILIRUBIN URINE: NEGATIVE
GLUCOSE, UA: NEGATIVE mg/dL
Ketones, ur: NEGATIVE mg/dL
Leukocytes, UA: NEGATIVE
Nitrite: NEGATIVE
PH: 7 (ref 5.0–8.0)
Protein, ur: NEGATIVE mg/dL
SPECIFIC GRAVITY, URINE: 1.01 (ref 1.005–1.030)

## 2016-08-23 LAB — CBC
HEMATOCRIT: 39.7 % (ref 36.0–46.0)
HEMOGLOBIN: 11.6 g/dL — AB (ref 12.0–15.0)
MCH: 23.4 pg — AB (ref 26.0–34.0)
MCHC: 29.2 g/dL — AB (ref 30.0–36.0)
MCV: 80 fL (ref 78.0–100.0)
Platelets: 295 10*3/uL (ref 150–400)
RBC: 4.96 MIL/uL (ref 3.87–5.11)
RDW: 16.1 % — ABNORMAL HIGH (ref 11.5–15.5)
WBC: 11.1 10*3/uL — ABNORMAL HIGH (ref 4.0–10.5)

## 2016-08-23 LAB — URINALYSIS, MICROSCOPIC (REFLEX)

## 2016-08-23 LAB — BASIC METABOLIC PANEL
Anion gap: 9 (ref 5–15)
BUN: 13 mg/dL (ref 6–20)
CHLORIDE: 99 mmol/L — AB (ref 101–111)
CO2: 31 mmol/L (ref 22–32)
CREATININE: 0.65 mg/dL (ref 0.44–1.00)
Calcium: 8.9 mg/dL (ref 8.9–10.3)
GFR calc Af Amer: >60 mL/min (ref >60)
GFR calc non Af Amer: >60 mL/min (ref >60)
GLUCOSE: 105 mg/dL — AB (ref 65–99)
POTASSIUM: 4.2 mmol/L (ref 3.5–5.1)
SODIUM: 139 mmol/L (ref 135–145)

## 2016-08-23 LAB — POC URINE PREG, ED: PREG TEST UR: NEGATIVE

## 2016-08-23 LAB — WET PREP, GENITAL
Sperm: NONE SEEN
TRICH WET PREP: NONE SEEN
Yeast Wet Prep HPF POC: NONE SEEN

## 2016-08-23 NOTE — ED Triage Notes (Signed)
Pt recently diagnosed with PE and started on xarelto. Reports having vaginal bleeding x 2 days with abd pain and back pain.

## 2016-08-23 NOTE — Discharge Instructions (Signed)
Your vaginal bleeding may be due to being on blood thinner medication.  However, please call and follow up closely with your Gynecologist for further care.  Continue taking your blood thinner medication as prescribed. If bleeding increases or if you have other concerns, please return for further care.

## 2016-08-23 NOTE — ED Provider Notes (Signed)
[facility]-EMERGENCY DEPT Provider Note   CSN: [id number] Arrival date & time: 08/23/16  [id number]     History   Chief Complaint Chief Complaint  Patient presents with  . Vaginal Bleeding    HPI Courtney Heysatricia D Deroo is a 51 y.o. female.  HPI   51 year old female with history of PE currently on Xarelto presenting complaining of vaginal bleeding. Patient was diagnosed with having a PE a week ago after prolonged travel from [place] to here. She is currently on Xarelto. For the past 2 days she has noticed increased urinary frequency, noticed blood in the urine as well as suspected vaginal bleeding. She noticed blood on her pad. She endorse mild lower abdominal cramping and back cramping for same duration. She denies fever, chills, lightheadedness, dizziness, increased chest pain or shortness of breath. No prior history of cancer, she is a former smoker and quit several months ago. Denies abnormal weight changes, night sweats or fever.  Past Medical History:  Diagnosis Date  . Asthma   . Myasthenia gravis (HCC)   . Pulmonary embolism [facility](HCC)     Patient Active Problem List   Diagnosis Date Noted  . Pulmonary emboli (HCC) 08/19/2016  . Asthma exacerbation 08/16/2016  . Depression 06/20/2014  . Anxiety 06/20/2014  . Sleep apnea 06/20/2014  . GERD (gastroesophageal reflux disease) 12/28/2012  . Myasthenia gravis (HCC) 09/10/1992    Past Surgical History:  Procedure Laterality Date  . THYROID SURGERY    . TONSILLECTOMY    . TUBAL LIGATION      OB History    No data available       Home Medications    Prior to Admission medications   Medication Sig Start Date End Date Taking? Authorizing Provider  albuterol (VENTOLIN HFA) 108 (90 Base) MCG/ACT inhaler Inhale 2 puffs into the lungs every 4 (four) hours as needed for wheezing or shortness of breath.  05/28/16   [provider]  fluticasone (FLONASE) 50 MCG/ACT nasal spray Place 1 spray into both nostrils daily.  05/30/15    [provider]  furosemide (LASIX) 20 MG tablet Take 1 tablet (20 mg total) by mouth daily. 08/19/16 08/19/17  Joseph ArtVann, Jessica U, DO  ipratropium-albuterol (DUONEB) 0.5-2.5 (3) MG/3ML SOLN Take 3 mLs by nebulization 3 (three) times daily as needed (wheezing). 08/19/16   Joseph ArtVann, Jessica U, DO  levothyroxine (SYNTHROID, LEVOTHROID) 112 MCG tablet Take 224 mcg by mouth daily. 07/02/16   [provider]  lisinopril (PRINIVIL,ZESTRIL) 10 MG tablet Take 1 tablet (10 mg total) by mouth daily. 08/20/16   Joseph ArtVann, Jessica U, DO  mycophenolate (CELLCEPT) 500 MG tablet Take 1,000 mg by mouth 2 (two) times daily. 06/01/16   [provider]  omeprazole (PRILOSEC) 20 MG capsule Take 20 mg by mouth 2 (two) times daily. 05/28/16   [provider]  PARoxetine (PAXIL) 20 MG tablet Take 20 mg by mouth daily. Take with paxil 30 mg to equal 50 mg    [provider]  PARoxetine (PAXIL) 30 MG tablet Take 30 mg by mouth daily. Take with paxil 20 mg to equal 50 mg    [provider]  potassium chloride (K-DUR) 10 MEQ tablet Take 1 tablet (10 mEq total) by mouth daily. 08/19/16   Joseph ArtVann, Jessica U, DO  pyridostigmine (MESTINON) 60 MG tablet Take 30 mg by mouth 5 (five) times daily.  06/09/16   [provider]  Rivaroxaban 15 & 20 MG TBPK Take as directed on package: Start with one 15mg   tablet by mouth twice a day with food. On Day 22, switch to one 20mg  tablet once a day with food. 08/19/16   Joseph ArtVann, Jessica U, DO    Family History History reviewed. No pertinent family history.  Social History Social History  Substance Use Topics  . Smoking status: Former Games developermoker  . Smokeless tobacco: Never Used  . Alcohol use Yes     Allergies   Patient has no known allergies.   Review of Systems Review of Systems  All other systems reviewed and are negative.    Physical Exam Updated Vital Signs BP 127/79 (BP Location: Right Arm)   Pulse 78   Temp 98.5 F (36.9 C) (Oral)   Resp  (!) 22   Ht 5\' 1"  (1.549 m)   Wt 131.5 kg (290 lb)   SpO2 97%   BMI 54.80 kg/m   Physical Exam  Constitutional: She appears well-developed and well-nourished. No distress.  Morbidly obese female laying in bed in no acute discomfort.  HENT:  Head: Atraumatic.  Eyes: Conjunctivae are normal.  Strabismus  Neck: Neck supple.  Cardiovascular: Normal rate and regular rhythm.   Pulmonary/Chest: Effort normal and breath sounds normal.  Abdominal: Bowel sounds are normal. She exhibits no distension. There is no tenderness.  Genitourinary:  Genitourinary Comments: Chaperone present during exam. Macerated skin noted around the perineum. Normal external labia. Mild discomfort with speculum insertion. Small amount of blood noted at the cervical os which is closed and in the vaginal vault. No discharge noted. On bimanual examination, no adnexal tenderness or cervical motion tenderness. Exam is limited due to large body habitus.  Neurological: She is alert.  Skin: No rash noted.  Several moderate size ecchymosis noted to the posterior upper arms bilaterally, and right lateral thigh with mild tenderness to palpation  Psychiatric: She has a normal mood and affect.  Nursing note and vitals reviewed.    ED Treatments / Results  Labs (all labs ordered are listed, but only abnormal results are displayed) Labs Reviewed  WET PREP, GENITAL - Abnormal; Notable for the following:       Result Value   Clue Cells Wet Prep HPF POC PRESENT (*)    WBC, Wet Prep HPF POC MODERATE (*)    All other components within normal limits  CBC - Abnormal; Notable for the following:    WBC 11.1 (*)    Hemoglobin 11.6 (*)    MCH 23.4 (*)    MCHC 29.2 (*)    RDW 16.1 (*)    All other components within normal limits  BASIC METABOLIC PANEL - Abnormal; Notable for the following:    Chloride 99 (*)    Glucose, Bld 105 (*)    All other components within normal limits  URINALYSIS, ROUTINE W REFLEX MICROSCOPIC - Abnormal;  Notable for the following:    Color, Urine AMBER (*)    APPearance HAZY (*)    Hgb urine dipstick LARGE (*)    All other components within normal limits  URINALYSIS, MICROSCOPIC (REFLEX) - Abnormal; Notable for the following:    Bacteria, UA RARE (*)    Squamous Epithelial / LPF 0-5 (*)    All other components within normal limits  RAPID HIV SCREEN (HIV 1/2 AB+AG)  RPR  POC URINE PREG, ED  GC/CHLAMYDIA PROBE AMP ([place]) NOT AT [facility]    EKG  EKG Interpretation None       Radiology No results found.  Procedures Procedures (including critical care time)  Medications Ordered in ED Medications - No data to display   Initial Impression / Assessment and Plan / ED Course  I have reviewed the triage vital signs and the nursing notes.  Pertinent labs & imaging results that were available during my care of the patient were reviewed by me and considered in my medical decision making (see chart for details).     BP 127/79 (BP Location: Right Arm)   Pulse 78   Temp 98.5 F (36.9 C) (Oral)   Resp (!) 22   Ht 5\' 1"  (1.549 m)   Wt 131.5 kg (290 lb)   SpO2 97%   BMI 54.80 kg/m    Final Clinical Impressions(s) / ED Diagnoses   Final diagnoses:  Vaginal bleeding    New Prescriptions New Prescriptions   No medications on file   11:22 AM Patient recently started on Xarelto here with either vaginal bleeding or hematuria. No history of cancer. She is postmenopausal. Workup initiated.  12:18 PM Patient is currently on blood thinner medication, she developed skin bruising from injection site, and vaginal bleeding. Fortunately, her platelet is normal, and she is not anemic. Electrolytes panel are reassuring.  Doubt UTI as pt's bleeding is likely from vaginal region.  Urine culture sent.   1:21 PM Wet prep with evidence of clue cells and moderate WBC. Patient does not have any other clinical features to suggest BV therefore, I do not think BV treatment is indicated at  this time. She is currently not sexually active, will wait for STD panel to come back before treating. Patient does have a gynecologist at [facility] who is also her primary care provider. She agrees to call and follow-up with her gynecologist tomorrow for further evaluation of her bleeding. I encouraged patient to continue taking her blood thinning medication as prescribed. She is hemodynamically stable, bleeding is minimal therefore she is stable to go home. Return precaution discussed.  Care discussed with DR. Lockwood.       Fayrene Helper, PA-C 08/23/16 1349    Gerhard Munch, MD 08/23/16 [phone]

## 2016-08-24 LAB — GC/CHLAMYDIA PROBE AMP (~~LOC~~) NOT AT ARMC
CHLAMYDIA, DNA PROBE: NEGATIVE
NEISSERIA GONORRHEA: NEGATIVE

## 2016-08-24 LAB — RPR: RPR: NONREACTIVE

## 2017-01-25 ENCOUNTER — Telehealth (HOSPITAL_COMMUNITY): Payer: Self-pay

## 2017-01-25 NOTE — Telephone Encounter (Signed)
Patients insurance is active and benefits verified through Medicare Part A & B - 20% co-insurance.  Patient will be contacted and scheduled.

## 2017-02-25 ENCOUNTER — Telehealth (HOSPITAL_COMMUNITY): Payer: Self-pay

## 2017-02-25 NOTE — Telephone Encounter (Signed)
Attempted to call patient in regards to Pulmonary Rehab - lm on vm °

## 2017-03-02 ENCOUNTER — Telehealth (HOSPITAL_COMMUNITY): Payer: Self-pay

## 2017-03-02 NOTE — Telephone Encounter (Signed)
2nd attempt to call patient in regards to Pulmonary Rehab - lm on vm. Sending letter. °

## 2017-03-04 ENCOUNTER — Telehealth (HOSPITAL_COMMUNITY): Payer: Self-pay

## 2017-03-04 NOTE — Telephone Encounter (Signed)
3rd attempt to call patient in regards to Pulmonary Rehab - lm on vm °

## 2017-12-26 ENCOUNTER — Emergency Department (HOSPITAL_COMMUNITY): Payer: Medicare Other

## 2017-12-26 ENCOUNTER — Other Ambulatory Visit: Payer: Self-pay

## 2017-12-26 ENCOUNTER — Emergency Department (HOSPITAL_COMMUNITY)
Admission: EM | Admit: 2017-12-26 | Discharge: 2017-12-26 | Disposition: A | Discharge status: Home / Self Care | Payer: Medicare Other | Attending: Emergency Medicine | Admitting: Emergency Medicine

## 2017-12-26 ENCOUNTER — Encounter (HOSPITAL_COMMUNITY): Payer: Self-pay

## 2017-12-26 DIAGNOSIS — M94261 Chondromalacia, right knee: Secondary | ICD-10-CM | POA: Diagnosis not present

## 2017-12-26 DIAGNOSIS — M949 Disorder of cartilage, unspecified: Secondary | ICD-10-CM

## 2017-12-26 DIAGNOSIS — M899 Disorder of bone, unspecified: Secondary | ICD-10-CM

## 2017-12-26 DIAGNOSIS — M25561 Pain in right knee: Secondary | ICD-10-CM | POA: Diagnosis present

## 2017-12-26 DIAGNOSIS — M1711 Unilateral primary osteoarthritis, right knee: Secondary | ICD-10-CM | POA: Insufficient documentation

## 2017-12-26 MED ORDER — TRAMADOL HCL 50 MG PO TABS
50.0000 mg | ORAL_TABLET | Freq: Once | ORAL | Status: AC
  Administered 2017-12-26: 50 mg via ORAL
  Filled 2017-12-26: qty 1

## 2017-12-26 MED ORDER — TRAMADOL HCL 50 MG PO TABS: 50.0000 mg | ORAL_TABLET | Freq: Four times a day (QID) | ORAL | 0 refills | Status: DC | PRN

## 2017-12-26 NOTE — ED Triage Notes (Signed)
Pt presents to ED for evaluation of right leg pain that started x2 days ago. Pt denies injury to extremity, states she does not know why it started to hurt. Pt able to bear weight but states it is difficult. Pt states pain is starting to radiating up in to her back.

## 2017-12-26 NOTE — ED Notes (Signed)
Patient transported to X-ray 

## 2017-12-26 NOTE — Discharge Instructions (Addendum)
Take the medications as needed for pain, you can also try applying ice, you can supplement with over-the-counter Tylenol, follow-up with an orthopedic doctor for further evaluation as we discussed.  The radiologist recommended that an MRI might be helpful to further evaluate your knee pain.  You can discuss this with an orthopedic doctor or your primary care doctor

## 2017-12-26 NOTE — ED Provider Notes (Signed)
[name] EM[facility]ENCY [facility]PART[facility] Provider Note   CSN: [id number] Arrival date & time: 12/26/17  1116     History   Chief Complaint Chief Complaint  Patient presents with  . Knee Pain    HPI [name] is a 52 y.o. female.  HPI Patient complains of pain to her right leg that started about 2 days ago.  Patient does not recall any specific injury but yesterday was having significant discomfort in her right leg.  The pain initially was in her right knee but also moves up into her hip and her back.  Felt like her knee was swollen earlier.  Movement increases the pain.  She denies any fevers or chills.  No falls. Past Medical History:  Diagnosis Date  . Asthma   . Myasthenia gravis (HCC)   . Pulmonary embolism [facility](HCC)     Patient Active Problem List   Diagnosis Date Noted  . Pulmonary emboli (HCC) 08/19/2016  . Asthma exacerbation 08/16/2016  . Depression 06/20/2014  . Anxiety 06/20/2014  . Sleep apnea 06/20/2014  . GERD (gastroesophageal reflux disease) 12/28/2012  . Myasthenia gravis (HCC) 09/10/1992    Past Surgical History:  Procedure Laterality Date  . THYROID SURGERY    . TONSILLECTOMY    . TUBAL LIGATION       OB History   None      Home Medications    Prior to Admission medications   Medication Sig Start Date End Date Taking? Authorizing Provider  albuterol (VENTOLIN HFA) 108 (90 Base) MCG/ACT inhaler Inhale 2 puffs into the lungs every 4 (four) hours as needed for wheezing or shortness of breath.  05/28/16   [provider]  fluticasone (FLONASE) 50 MCG/ACT nasal spray Place 1 spray into both nostrils daily.  05/30/15   [provider]  furosemide (LASIX) 20 MG tablet Take 1 tablet (20 mg total) by mouth daily. 08/19/16 08/19/17  Joseph ArtVann, Jessica U, DO  ipratropium-albuterol (DUONEB) 0.5-2.5 (3) MG/3ML SOLN Take 3 mLs by nebulization 3 (three) times daily as needed (wheezing). 08/19/16   Joseph ArtVann, Jessica U, DO  levothyroxine  (SYNTHROID, LEVOTHROID) 112 MCG tablet Take 224 mcg by mouth daily. 07/02/16   [provider]  lisinopril (PRINIVIL,ZESTRIL) 10 MG tablet Take 1 tablet (10 mg total) by mouth daily. 08/20/16   Joseph ArtVann, Jessica U, DO  mycophenolate (CELLCEPT) 500 MG tablet Take 1,000 mg by mouth 2 (two) times daily. 06/01/16   [provider]  omeprazole (PRILOSEC) 20 MG capsule Take 20 mg by mouth 2 (two) times daily. 05/28/16   [provider]  PARoxetine (PAXIL) 20 MG tablet Take 20 mg by mouth daily. Take with paxil 30 mg to equal 50 mg    [provider]  PARoxetine (PAXIL) 30 MG tablet Take 30 mg by mouth daily. Take with paxil 20 mg to equal 50 mg    [provider]  potassium chloride (K-DUR) 10 MEQ tablet Take 1 tablet (10 mEq total) by mouth daily. 08/19/16   Joseph ArtVann, Jessica U, DO  pyridostigmine (MESTINON) 60 MG tablet Take 30 mg by mouth 5 (five) times daily.  06/09/16   [provider]  Rivaroxaban 15 & 20 MG TBPK Take as directed on package: Start with one 15mg  tablet by mouth twice a day with food. On Day 22, switch to one 20mg  tablet once a day with food. 08/19/16   Joseph ArtVann, Jessica U, DO  traMADol (ULTRAM) 50 MG tablet Take 1 tablet (50 mg total)  by mouth every 6 (six) hours as needed. 12/26/17   Linwood Dibbles, MD    Family History No family history on file.  Social History Social History   Tobacco Use  . Smoking status: Former Games developer  . Smokeless tobacco: Never Used  Substance Use Topics  . Alcohol use: Yes  . Drug use: Not on file     Allergies   Patient has no known allergies.   Review of Systems Review of Systems  All other systems reviewed and are negative.    Physical Exam Updated Vital Signs BP (!) 107/58 (BP Location: Right Arm)   Pulse 79   Temp 99.7 F (37.6 C) (Oral)   Resp 18   Ht 1.562 m (5' 1.5")   Wt 129.3 kg   SpO2 96%   BMI 52.98 kg/m   Physical Exam  Constitutional: No distress.  HENT:  Head: Normocephalic and  atraumatic.  Right Ear: External ear normal.  Left Ear: External ear normal.  Eyes: Conjunctivae are normal. Right eye exhibits no discharge. Left eye exhibits no discharge. No scleral icterus.  Neck: Neck supple. No tracheal deviation present.  Cardiovascular: Normal rate.  Pulmonary/Chest: Effort normal. No stridor. No respiratory distress.  Abdominal: She exhibits no distension.  Musculoskeletal: She exhibits no edema.       Right hip: She exhibits no bony tenderness and no swelling.       Right knee: She exhibits decreased range of motion. She exhibits no swelling and no effusion. Tenderness found.       Right ankle: Normal.       Lumbar back: She exhibits tenderness. She exhibits no swelling and no edema.  Neurological: She is alert. Cranial nerve deficit: no gross deficits.  5/55 plantarflexion and dorsiflexion bilaterally, normal sensation bilateral lower extremities  Skin: Skin is warm and dry. No rash noted. She is not diaphoretic.  Psychiatric: She has a normal mood and affect.  Nursing note and vitals reviewed.    ED Treatments / Results  Labs (all labs ordered are listed, but only abnormal results are displayed) Labs Reviewed - No data to display  EKG None  Radiology Dg Lumbar Spine Complete  Result Date: 12/26/2017 CLINICAL DATA:  No known injury, lumbar pain EXAM: LUMBAR SPINE - COMPLETE 4+ VIEW COMPARISON:  02/13/2005 FINDINGS: There is no evidence of lumbar spine fracture. Alignment is normal. Intervertebral disc spaces are maintained. IMPRESSION: No acute osseous injury of the lumbar spine. Electronically Signed   By: Elige Ko   On: 12/26/2017 13:43   Dg Knee Complete 4 Views Right  Result Date: 12/26/2017 CLINICAL DATA:  Right knee pain this morning. EXAM: RIGHT KNEE - COMPLETE 4+ VIEW COMPARISON:  None. FINDINGS: Moderate tricompartmental degenerative changes with joint space narrowing, osteophytic spurring and subchondral cystic change. 17.5 mm suspected  osteochondral lesion involving the medial femoral condyle. It is also possible this could be a subchondral cyst or geode but there appear to be small defects in the subchondral plate and a small adjacent bony fragment on the lateral film. Probable small joint effusion. IMPRESSION: 1. Tricompartmental degenerative changes. 2. Suspect osteochondral lesion involving the medial femoral condyle. MRI may be helpful for further evaluation. Electronically Signed   By: Rudie Meyer M.D.   On: 12/26/2017 13:43   Dg Hip Unilat W Or Wo Pelvis 2-3 Views Right  Result Date: 12/26/2017 CLINICAL DATA:  Left hip pain EXAM: DG HIP (WITH OR WITHOUT PELVIS) 2-3V RIGHT COMPARISON:  None. FINDINGS: There is no  evidence of hip fracture or dislocation. There is no evidence of arthropathy or other focal bone abnormality. IMPRESSION: No acute osseous injury of the right hip. Electronically Signed   By: Elige Ko   On: 12/26/2017 13:45    Procedures Procedures (including critical care time)  Medications Ordered in ED Medications  traMADol (ULTRAM) tablet 50 mg (has no administration in time range)     Initial Impression / Assessment and Plan / ED Course  I have reviewed the triage vital signs and the nursing notes.  Pertinent labs & imaging results that were available during my care of the patient were reviewed by me and considered in my medical decision making (see chart for details).   Patient's x-rays show evidence of significant arthritis in her right knee.  Patient also has evidence of an osteochondral lesion.  No evidence of lumbar or hip abnormality.  Patient has been taking over-the-counter Tylenol.  I will give her prescription for Ultram and I recommend she try using a walker in the meantime.  She should follow-up with an orthopedic doctor for further evaluation.  Final Clinical Impressions(s) / ED Diagnoses   Final diagnoses:  Osteoarthritis of right knee, unspecified osteoarthritis type    Osteochondral lesion    ED Discharge Orders         Ordered    traMADol (ULTRAM) 50 MG tablet  Every 6 hours PRN     12/26/17 1401    For home use only DME 4 wheeled rolling walker with seat     12/26/17 1403           Linwood Dibbles, MD 12/26/17 1404

## 2018-03-09 ENCOUNTER — Emergency Department (HOSPITAL_COMMUNITY): Payer: Medicare Other

## 2018-03-09 ENCOUNTER — Other Ambulatory Visit: Payer: Self-pay

## 2018-03-09 ENCOUNTER — Encounter (HOSPITAL_COMMUNITY): Payer: Self-pay | Admitting: Emergency Medicine

## 2018-03-09 ENCOUNTER — Emergency Department (HOSPITAL_COMMUNITY)
Admission: EM | Admit: 2018-03-09 | Discharge: 2018-03-09 | Disposition: A | Discharge status: Home / Self Care | Payer: Medicare Other | Attending: Emergency Medicine | Admitting: Emergency Medicine

## 2018-03-09 DIAGNOSIS — J45909 Unspecified asthma, uncomplicated: Secondary | ICD-10-CM | POA: Diagnosis not present

## 2018-03-09 DIAGNOSIS — Z79899 Other long term (current) drug therapy: Secondary | ICD-10-CM | POA: Insufficient documentation

## 2018-03-09 DIAGNOSIS — R197 Diarrhea, unspecified: Secondary | ICD-10-CM | POA: Insufficient documentation

## 2018-03-09 DIAGNOSIS — R1084 Generalized abdominal pain: Secondary | ICD-10-CM

## 2018-03-09 DIAGNOSIS — Z87891 Personal history of nicotine dependence: Secondary | ICD-10-CM | POA: Insufficient documentation

## 2018-03-09 DIAGNOSIS — R112 Nausea with vomiting, unspecified: Secondary | ICD-10-CM | POA: Insufficient documentation

## 2018-03-09 DIAGNOSIS — R079 Chest pain, unspecified: Secondary | ICD-10-CM | POA: Diagnosis not present

## 2018-03-09 LAB — COMPREHENSIVE METABOLIC PANEL
ALT: 10 U/L (ref 0–44)
AST: 15 U/L (ref 15–41)
Albumin: 3.7 g/dL (ref 3.5–5.0)
Alkaline Phosphatase: 54 U/L (ref 38–126)
Anion gap: 11 (ref 5–15)
BILIRUBIN TOTAL: 0.5 mg/dL (ref 0.3–1.2)
BUN: 5 mg/dL — AB (ref 6–20)
CO2: 25 mmol/L (ref 22–32)
CREATININE: 0.71 mg/dL (ref 0.44–1.00)
Calcium: 9.3 mg/dL (ref 8.9–10.3)
Chloride: 102 mmol/L (ref 98–111)
Glucose, Bld: 126 mg/dL — ABNORMAL HIGH (ref 70–99)
POTASSIUM: 4 mmol/L (ref 3.5–5.1)
Sodium: 138 mmol/L (ref 135–145)
TOTAL PROTEIN: 7.5 g/dL (ref 6.5–8.1)

## 2018-03-09 LAB — CBC
HCT: 37.7 % (ref 36.0–46.0)
HEMOGLOBIN: 11.3 g/dL — AB (ref 12.0–15.0)
MCH: 25.4 pg — AB (ref 26.0–34.0)
MCHC: 30 g/dL (ref 30.0–36.0)
MCV: 84.7 fL (ref 80.0–100.0)
Platelets: 205 10*3/uL (ref 150–400)
RBC: 4.45 MIL/uL (ref 3.87–5.11)
RDW: 16.4 % — ABNORMAL HIGH (ref 11.5–15.5)
WBC: 6.8 10*3/uL (ref 4.0–10.5)
nRBC: 0 % (ref 0.0–0.2)

## 2018-03-09 LAB — URINALYSIS, ROUTINE W REFLEX MICROSCOPIC
BILIRUBIN URINE: NEGATIVE
GLUCOSE, UA: NEGATIVE mg/dL
HGB URINE DIPSTICK: NEGATIVE
Ketones, ur: NEGATIVE mg/dL
LEUKOCYTE UA: NEGATIVE
NITRITE: NEGATIVE
PH: 7 (ref 5.0–8.0)
Protein, ur: 100 mg/dL — AB

## 2018-03-09 LAB — I-STAT BETA HCG BLOOD, ED (MC, WL, AP ONLY): I-stat hCG, quantitative: <5 m[IU]/mL (ref <5)

## 2018-03-09 LAB — LIPASE, BLOOD: Lipase: 23 U/L (ref 11–51)

## 2018-03-09 LAB — I-STAT TROPONIN, ED: Troponin i, poc: 0 ng/mL (ref 0.00–0.08)

## 2018-03-09 MED ORDER — SODIUM CHLORIDE 0.9 % IV BOLUS
1000.0000 mL | Freq: Once | INTRAVENOUS | Status: AC
  Administered 2018-03-09: 1000 mL via INTRAVENOUS

## 2018-03-09 MED ORDER — MORPHINE SULFATE (PF) 4 MG/ML IV SOLN
4.0000 mg | Freq: Once | INTRAVENOUS | Status: AC
  Administered 2018-03-09: 4 mg via INTRAVENOUS
  Filled 2018-03-09: qty 1

## 2018-03-09 MED ORDER — SODIUM CHLORIDE 0.9% FLUSH: 3.0000 mL | Freq: Once | INTRAVENOUS | Status: DC

## 2018-03-09 MED ORDER — IOHEXOL 300 MG/ML  SOLN
100.0000 mL | Freq: Once | INTRAMUSCULAR | Status: AC | PRN
  Administered 2018-03-09: 100 mL via INTRAVENOUS

## 2018-03-09 MED ORDER — FAMOTIDINE 20 MG PO TABS: 20.0000 mg | ORAL_TABLET | Freq: Two times a day (BID) | ORAL | 0 refills | Status: DC

## 2018-03-09 MED ORDER — SUCRALFATE 1 G PO TABS: 1.0000 g | ORAL_TABLET | Freq: Three times a day (TID) | ORAL | 0 refills | Status: DC

## 2018-03-09 MED ORDER — ONDANSETRON HCL 4 MG/2ML IJ SOLN
4.0000 mg | Freq: Once | INTRAMUSCULAR | Status: AC
  Administered 2018-03-09: 4 mg via INTRAVENOUS
  Filled 2018-03-09: qty 2

## 2018-03-09 MED ORDER — ONDANSETRON 4 MG PO TBDP: 4.0000 mg | ORAL_TABLET | Freq: Three times a day (TID) | ORAL | 0 refills | Status: DC | PRN

## 2018-03-09 MED ORDER — ONDANSETRON 4 MG PO TBDP
4.0000 mg | ORAL_TABLET | Freq: Once | ORAL | Status: AC | PRN
  Administered 2018-03-09: 4 mg via ORAL
  Filled 2018-03-09: qty 1

## 2018-03-09 NOTE — ED Triage Notes (Signed)
PT reports unable to collect urine sample at this time

## 2018-03-09 NOTE — ED Triage Notes (Signed)
While talking to Pt about starting an Iv . 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 Pt continued to cuss at this .

## 2018-03-09 NOTE — ED Triage Notes (Signed)
Pt reports nausea, diarrhea, and vomiting since yesterday. Pt also reports chest pain that started after vomiting.

## 2018-03-09 NOTE — ED Provider Notes (Signed)
[facility] EMERGENCY DEPARTMENT Provider Note   CSN: [id number] Arrival date & time: 03/09/18  1508  History   Chief Complaint Chief Complaint  Patient presents with  . Nausea  . Diarrhea  . Non-Cardiac Chest Pain    HPI [name] is a 53 y.o. female with past medical history significant for asthma, myasthenia gravis, PE who presents for evaluation of nausea and diarrhea.  Patient states that generalized abdominal pain as well as nausea, vomiting x2days.  States his pain is severe in nature and started suddenly.  She rates her pain a 10/10.  Pain does not radiate.  Patient states she also began to have central chest pain after she started vomiting.  Emesis nonbloody, nonbilious in nature, diarrhea nonbloody in nature.  No recent travel or recent antibiotic use.  Nonexertional nature.  Does have history of PE, no cough, hemoptysis, lower extremity swelling, tenderness, redness or warmth.  No recent exogenous hormone use, recent surgery or recent mobilization.  No longer on any coagulation.  Denies fever, chills, headache, neck pain, neck stiffness, pelvic pain, vaginal discharge, vaginal bleeding.  Has not taken anything for symptoms PTA.  Denies additional aggravating or relieving factors.  Prior surgeries include for laproscopic tubal ligation. Last PO intake 8pm yesterday evening.  History obtained from patient and family member in room.  No interpreter was used.   HPI  Past Medical History:  Diagnosis Date  . Asthma   . Myasthenia gravis (HCC)   . Pulmonary embolism [facility])     Patient Active Problem List   Diagnosis Date Noted  . Pulmonary emboli (HCC) 08/19/2016  . Asthma exacerbation 08/16/2016  . Depression 06/20/2014  . Anxiety 06/20/2014  . Sleep apnea 06/20/2014  . GERD (gastroesophageal reflux disease) 12/28/2012  . Myasthenia gravis (HCC) 09/10/1992    Past Surgical History:  Procedure Laterality Date  . THYROID SURGERY    .  TONSILLECTOMY    . TUBAL LIGATION       OB History   No obstetric history on file.      Home Medications    Prior to Admission medications   Medication Sig Start Date End Date Taking? Authorizing Provider  albuterol (VENTOLIN HFA) 108 (90 Base) MCG/ACT inhaler Inhale 2 puffs into the lungs every 4 (four) hours as needed for wheezing or shortness of breath.  05/28/16   [provider]  famotidine (PEPCID) 20 MG tablet Take 1 tablet (20 mg total) by mouth 2 (two) times daily. 03/09/18   Sonya Pucci A, PA-C  fluticasone (FLONASE) 50 MCG/ACT nasal spray Place 1 spray into both nostrils daily.  05/30/15   [provider]  furosemide (LASIX) 20 MG tablet Take 1 tablet (20 mg total) by mouth daily. 08/19/16 08/19/17  Joseph Art, DO  ipratropium-albuterol (DUONEB) 0.5-2.5 (3) MG/3ML SOLN Take 3 mLs by nebulization 3 (three) times daily as needed (wheezing). 08/19/16   Joseph Art, DO  levothyroxine (SYNTHROID, LEVOTHROID) 112 MCG tablet Take 224 mcg by mouth daily. 07/02/16   [provider]  lisinopril (PRINIVIL,ZESTRIL) 10 MG tablet Take 1 tablet (10 mg total) by mouth daily. 08/20/16   Joseph Art, DO  mycophenolate (CELLCEPT) 500 MG tablet Take 1,000 mg by mouth 2 (two) times daily. 06/01/16   [provider]  omeprazole (PRILOSEC) 20 MG capsule Take 20 mg by mouth 2 (two) times daily. 05/28/16   [provider]  ondansetron (ZOFRAN ODT) 4 MG disintegrating tablet Take 1 tablet (4  mg total) by mouth every 8 (eight) hours as needed for nausea or vomiting. 03/09/18   Devona Holmes A, PA-C  PARoxetine (PAXIL) 20 MG tablet Take 20 mg by mouth daily. Take with paxil 30 mg to equal 50 mg    [provider]  PARoxetine (PAXIL) 30 MG tablet Take 30 mg by mouth daily. Take with paxil 20 mg to equal 50 mg    [provider]  potassium chloride (K-DUR) 10 MEQ tablet Take 1 tablet (10 mEq total) by mouth daily. 08/19/16   Joseph Art,  DO  pyridostigmine (MESTINON) 60 MG tablet Take 30 mg by mouth 5 (five) times daily.  06/09/16   [provider]  Rivaroxaban 15 & 20 MG TBPK Take as directed on package: Start with one 15mg  tablet by mouth twice a day with food. On Day 22, switch to one 20mg  tablet once a day with food. 08/19/16   Joseph Art, DO  sucralfate (CARAFATE) 1 g tablet Take 1 tablet (1 g total) by mouth 4 (four) times daily -  with meals and at bedtime for 7 days. 03/09/18 03/16/18  Natnael Biederman A, PA-C  traMADol (ULTRAM) 50 MG tablet Take 1 tablet (50 mg total) by mouth every 6 (six) hours as needed. 12/26/17   Linwood Dibbles, MD    Family History No family history on file.  Social History Social History   Tobacco Use  . Smoking status: Former Games developer  . Smokeless tobacco: Never Used  Substance Use Topics  . Alcohol use: Yes  . Drug use: Never     Allergies   Patient has no known allergies.   Review of Systems Review of Systems  Constitutional: Negative.   HENT: Negative.   Respiratory: Negative.   Cardiovascular: Positive for chest pain. Negative for palpitations and leg swelling.  Gastrointestinal: Positive for abdominal pain, diarrhea, nausea and vomiting. Negative for anal bleeding, blood in stool, constipation and rectal pain.  Genitourinary: Negative.   Musculoskeletal: Negative.   Skin: Negative.   Neurological: Negative.   All other systems reviewed and are negative.    Physical Exam Updated Vital Signs BP (!) 129/57   Pulse 63   Temp 97.9 F (36.6 C) (Oral)   Resp 20   Ht 5\' 1"  (1.549 m)   Wt 117.9 kg   LMP  (Exact Date)   SpO2 94%   BMI 49.13 kg/m   Physical Exam Vitals signs and nursing note reviewed.  Constitutional:      General: She is not in acute distress.    Appearance: She is well-developed. She is obese. She is not ill-appearing, toxic-appearing or diaphoretic.     Comments: Patient tossing and turning in bed on initial evaluation.  Patient moaning.    HENT:     Head: Atraumatic.     Nose: No congestion or rhinorrhea.     Mouth/Throat:     Comments: Mucous membranes moist.  Posterior oropharynx clear.  No tonsillar edema or exudate. Eyes:     Pupils: Pupils are equal, round, and reactive to light.     Comments: No ptosis  Neck:     Musculoskeletal: Normal range of motion.  Cardiovascular:     Rate and Rhythm: Normal rate.     Pulses: Normal pulses.     Heart sounds: Normal heart sounds.  Pulmonary:     Effort: No respiratory distress.     Comments: Clear to auscultation bilaterally without wheeze, rhonchi or rales.  No accessory  muscle usage.  Able to speak in full sentences without difficulty. Abdominal:     General: There is no distension.     Palpations: There is no shifting dullness, fluid wave or splenomegaly.     Tenderness: Negative signs include Murphy's sign, Rovsing's sign, McBurney's sign, psoas sign and obturator sign.     Comments: Soft without rebound or guarding.  There is mild generalized abdominal tenderness palpation.  CVA tenderness.  No evidence of abdominal wall herniations.  Normoactive bowel sounds.  Musculoskeletal: Normal range of motion.     Comments: Moves all extremities without difficulty.  Ambulatory in department without difficulty.  Skin:    General: Skin is warm and dry.     Comments: No rashes or lesions.  Brisk capillary refill.  Neurological:     Mental Status: She is alert.     Comments: 5/5 strength bilaterally.    ED Treatments / Results  Labs (all labs ordered are listed, but only abnormal results are displayed) Labs Reviewed  COMPREHENSIVE METABOLIC PANEL - Abnormal; Notable for the following components:      Result Value   Glucose, Bld 126 (*)    BUN 5 (*)    All other components within normal limits  CBC - Abnormal; Notable for the following components:   Hemoglobin 11.3 (*)    MCH 25.4 (*)    RDW 16.4 (*)    All other components within normal limits  URINALYSIS, ROUTINE W  REFLEX MICROSCOPIC - Abnormal; Notable for the following components:   Specific Gravity, Urine >1.046 (*)    Protein, ur 100 (*)    Bacteria, UA RARE (*)    All other components within normal limits  LIPASE, BLOOD  I-STAT BETA HCG BLOOD, ED ([facility], [facility], [facility] ONLY)  I-STAT TROPONIN, ED    EKG EKG Interpretation  Date/Time:  Wednesday March 09 2018 15:23:23 EST Ventricular Rate:  53 PR Interval:  156 QRS Duration: 78 QT Interval:  504 QTC Calculation: 472 R Axis:   16 Text Interpretation:  Sinus bradycardia Otherwise normal ECG Confirmed by Kristine RoyalMessick, Peter [phone]([id number]) on 03/09/2018 5:13:14 PM   Radiology Ct Abdomen Pelvis W Contrast  Result Date: 03/09/2018 CLINICAL DATA:  Nausea, diarrhea, and vomiting beginning yesterday. Chest pain. Abdominal pain, acute, generalized. EXAM: CT ABDOMEN AND PELVIS WITH CONTRAST TECHNIQUE: Multidetector CT imaging of the abdomen and pelvis was performed using the standard protocol following bolus administration of intravenous contrast. CONTRAST:  100mL OMNIPAQUE IOHEXOL 300 MG/ML  SOLN COMPARISON:  Lumbar spine radiographs 12/26/2017 FINDINGS: Lower chest: Mild dependent atelectasis is present bilaterally. No focal nodule, mass, or airspace disease is present. Heart is mildly enlarged. Hepatobiliary: No focal liver abnormality is seen. No gallstones, gallbladder wall thickening, or biliary dilatation. Pancreas: Unremarkable. No pancreatic ductal dilatation or surrounding inflammatory changes. Spleen: Normal in size without focal abnormality. Adrenals/Urinary Tract: Bilateral renal cysts are present. The largest is laterally in the right kidney measuring up to 2.6 cm. No other mass lesion is present. There is no stone. Ureters are within normal limits bilaterally. The urinary bladder is normal. Stomach/Bowel: The stomach and duodenum are within normal limits. Small bowel is unremarkable. Terminal ileum mostly collapsed. The colon is mostly collapsed. Diverticular  changes are present in the sigmoid without focal inflammatory change. Vascular/Lymphatic: No significant vascular findings are present. No enlarged abdominal or pelvic lymph nodes. Reproductive: Prominent fibroids are present at the fundus. A left-sided fibroid measures 7.2 x 6.9 x 5.1 cm. A right-sided fibroid measures 6.9 x  5.8 x 5.9 cm. Adnexa are within normal limits. Other: No abdominal wall hernia or abnormality. No abdominopelvic ascites. Musculoskeletal: Vertebral body heights alignment are normal. No focal lytic or blastic lesion is present. Remote coccyx fracture is evident. The hips are located and within normal limits. IMPRESSION: 1. No acute or focal abnormality to explain the patient's abdominal pain. 2. Diffuse collapse of the distal small bowel and colon. This may be the sequelae of prior diarrhea. There is no focal inflammation or mass. No obstruction is present. 3. Bilateral renal cysts are benign. 4. Prominent uterine fibroids. Electronically Signed   By: Marin Roberts M.D.   On: 03/09/2018 18:29    Procedures Procedures (including critical care time)  Medications Ordered in ED Medications  sodium chloride flush (NS) 0.9 % injection 3 mL (has no administration in time range)  ondansetron (ZOFRAN-ODT) disintegrating tablet 4 mg (4 mg Oral Given 03/09/18 1540)  sodium chloride 0.9 % bolus 1,000 mL (1,000 mLs Intravenous New Bag/Given 03/09/18 1750)  ondansetron (ZOFRAN) injection 4 mg (4 mg Intravenous Given 03/09/18 1736)  morphine 4 MG/ML injection 4 mg (4 mg Intravenous Given 03/09/18 1736)  iohexol (OMNIPAQUE) 300 MG/ML solution 100 mL (100 mLs Intravenous Contrast Given 03/09/18 1804)    Initial Impression / Assessment and Plan / ED Course  I have reviewed the triage vital signs and the nursing notes.  Pertinent labs & imaging results that were available during my care of the patient were reviewed by me and considered in my medical decision making (see chart for  details).  53 year old female who appears otherwise well presents for evaluation of abdominal pain, emesis and diarrhea.  Afebrile, nonseptic, non-ill-appearing.  Initial evaluation patient is tossing and turning in bed moaning due to pain.  Patient origionally refused IV access, cursing at nursing staff.  Abdomen soft without rebound or guarding.  She does have generalized abdominal tenderness.  No recent antibiotic use or travel.  Emssis nonbloody, nonbilious in nature, no melena or bright blood per rectum.  Pelvic pain or vaginal discharge to suggest pelvic pathology.  Labs ordered from triage.  CBC without leukocytosis, hemoglobin 11.3, previous 11.6, 9.8, metabolic panel with mild elevation glucose at 126, no additional electrolyte, renal or liver abnormality, hCG negative, lipase 23.  Has had chest pain, however non-exhertional nature.  Chest pain began after multiple episodes of emesis.  No associated diaphoresis, lightheadedness, dizziness, radiation of pain.  Has history of PE, however no cough, hemoptysis, pleuritic chest pain, no lower extremity edema, erythema or warmth.  No recent exogenous hormone use, recent surgeries or recent mobilization.  Low suspicion for ACS, PE, dissection causing patient's chest pain.  Likely related to emesis.  Will obtain CT scan, give fluids, antiemetics and pain management and reevaluate. No evidence of acute myasthenia crisis.  1845: Patient with decrease oxygen saturation to 92% and sleepy after pain medication.  Patient placed on 2L nasal cannula.  1925: Reevaluation patient sitting up in bed without any acute complaints.  Patient states she has no abdominal pain at this time.  Abdomen soft, nontender without rebound or guarding.  Patient with oxygen saturations at 100% on room air.  CT scan negative for acute pathology.  She does have diffuse collapse of the bowel consistent with sequale of diarrhea.  Urinalysis pending.  Able to tolerate p.o. intake in  department that difficulty.  She has not had any episodes of emesis or diarrhea in department.  2010: Urinalysis negative for evidence of infection. Patient is nontoxic,  nonseptic appearing, in no apparent distress.  Patient's pain and other symptoms adequately managed in emergency department.  Fluid bolus given.  Labs, imaging and vitals reviewed.  Patient does not meet the SIRS or Sepsis criteria.  On repeat exam patient does not have a surgical abdomin and there are no peritoneal signs.  No indication of appendicitis, bowel obstruction, bowel perforation, cholecystitis, diverticulitis, PID or ectopic pregnancy.  Patient discharged home with symptomatic treatment and given strict instructions for follow-up with their primary care physician.  I have also discussed reasons to return immediately to the ER.  Patient expresses understanding and agrees with plan.     Final Clinical Impressions(s) / ED Diagnoses   Final diagnoses:  Generalized abdominal pain  Nausea vomiting and diarrhea    ED Discharge Orders         Ordered    sucralfate (CARAFATE) 1 g tablet  3 times daily with meals & bedtime     03/09/18 1948    famotidine (PEPCID) 20 MG tablet  2 times daily     03/09/18 1948    ondansetron (ZOFRAN ODT) 4 MG disintegrating tablet  Every 8 hours PRN     03/09/18 1948           Regino Fournet A, PA-C 03/09/18 2015    Wynetta Fines, MD 03/17/18 [phone]

## 2018-03-09 NOTE — Discharge Instructions (Addendum)
Evaluated today for abdominal pain. Your CT scan and lab work was negative in the department.  I have given you Carafate, Pepcid as well as Zofran.  Please take as prescribed.  Zofran is a disintegrating tablet you placed on your tongue for nausea vomiting.  Follow up with your PCP for reevaluation over the next 2 days if you continue to have symptoms.  Return to the ED for any worsening symptoms.

## 2018-06-07 ENCOUNTER — Encounter (HOSPITAL_COMMUNITY): Payer: Self-pay | Admitting: Emergency Medicine

## 2018-06-07 ENCOUNTER — Emergency Department (HOSPITAL_COMMUNITY): Payer: 59

## 2018-06-07 ENCOUNTER — Other Ambulatory Visit: Payer: Self-pay

## 2018-06-07 ENCOUNTER — Emergency Department (HOSPITAL_COMMUNITY)
Admission: EM | Admit: 2018-06-07 | Discharge: 2018-06-07 | Disposition: A | Discharge status: Home / Self Care | Payer: 59 | Attending: Emergency Medicine | Admitting: Emergency Medicine

## 2018-06-07 DIAGNOSIS — Z86711 Personal history of pulmonary embolism: Secondary | ICD-10-CM | POA: Insufficient documentation

## 2018-06-07 DIAGNOSIS — G7000 Myasthenia gravis without (acute) exacerbation: Secondary | ICD-10-CM | POA: Insufficient documentation

## 2018-06-07 DIAGNOSIS — Z20828 Contact with and (suspected) exposure to other viral communicable diseases: Secondary | ICD-10-CM | POA: Diagnosis not present

## 2018-06-07 DIAGNOSIS — R0789 Other chest pain: Secondary | ICD-10-CM | POA: Diagnosis not present

## 2018-06-07 DIAGNOSIS — R0682 Tachypnea, not elsewhere classified: Secondary | ICD-10-CM | POA: Insufficient documentation

## 2018-06-07 DIAGNOSIS — Z79899 Other long term (current) drug therapy: Secondary | ICD-10-CM | POA: Insufficient documentation

## 2018-06-07 DIAGNOSIS — J4541 Moderate persistent asthma with (acute) exacerbation: Secondary | ICD-10-CM | POA: Insufficient documentation

## 2018-06-07 DIAGNOSIS — R0602 Shortness of breath: Secondary | ICD-10-CM | POA: Diagnosis present

## 2018-06-07 DIAGNOSIS — Z87891 Personal history of nicotine dependence: Secondary | ICD-10-CM | POA: Diagnosis not present

## 2018-06-07 HISTORY — DX: Cardiac murmur, unspecified: R01.1

## 2018-06-07 HISTORY — DX: Depression, unspecified: F32.A

## 2018-06-07 HISTORY — DX: Sleep apnea, unspecified: G47.30

## 2018-06-07 HISTORY — DX: Disorder of thyroid, unspecified: E07.9

## 2018-06-07 HISTORY — DX: Obesity, unspecified: E66.9

## 2018-06-07 LAB — CBC
HCT: 38.2 % (ref 36.0–46.0)
Hemoglobin: 11.8 g/dL — ABNORMAL LOW (ref 12.0–15.0)
MCH: 26.6 pg (ref 26.0–34.0)
MCHC: 30.9 g/dL (ref 30.0–36.0)
MCV: 86 fL (ref 80.0–100.0)
Platelets: 227 10*3/uL (ref 150–400)
RBC: 4.44 MIL/uL (ref 3.87–5.11)
RDW: 17.2 % — ABNORMAL HIGH (ref 11.5–15.5)
WBC: 7.3 10*3/uL (ref 4.0–10.5)
nRBC: 0 % (ref 0.0–0.2)

## 2018-06-07 LAB — BASIC METABOLIC PANEL
Anion gap: 8 (ref 5–15)
BUN: 5 mg/dL — ABNORMAL LOW (ref 6–20)
CO2: 29 mmol/L (ref 22–32)
Calcium: 9.6 mg/dL (ref 8.9–10.3)
Chloride: 103 mmol/L (ref 98–111)
Creatinine, Ser: 0.87 mg/dL (ref 0.44–1.00)
GFR calc Af Amer: >60 mL/min (ref >60)
GFR calc non Af Amer: >60 mL/min (ref >60)
Glucose, Bld: 95 mg/dL (ref 70–99)
Potassium: 4.6 mmol/L (ref 3.5–5.1)
Sodium: 140 mmol/L (ref 135–145)

## 2018-06-07 LAB — TROPONIN I
Troponin I: <0.03 ng/mL (ref <0.03)
Troponin I: <0.03 ng/mL (ref <0.03)

## 2018-06-07 LAB — I-STAT BETA HCG BLOOD, ED (MC, WL, AP ONLY): I-stat hCG, quantitative: <5 m[IU]/mL (ref <5)

## 2018-06-07 LAB — SARS CORONAVIRUS 2 BY RT PCR (HOSPITAL ORDER, PERFORMED IN ~~LOC~~ HOSPITAL LAB): SARS Coronavirus 2: NEGATIVE

## 2018-06-07 MED ORDER — SODIUM CHLORIDE 0.9% FLUSH
3.0000 mL | Freq: Once | INTRAVENOUS | Status: AC
  Administered 2018-06-07: 19:00:00 3 mL via INTRAVENOUS

## 2018-06-07 MED ORDER — ALBUTEROL SULFATE HFA 108 (90 BASE) MCG/ACT IN AERS
6.0000 | INHALATION_SPRAY | Freq: Once | RESPIRATORY_TRACT | Status: AC
  Administered 2018-06-07: 6 via RESPIRATORY_TRACT
  Filled 2018-06-07: qty 6.7

## 2018-06-07 MED ORDER — METHYLPREDNISOLONE SODIUM SUCC 125 MG IJ SOLR
125.0000 mg | Freq: Once | INTRAMUSCULAR | Status: AC
  Administered 2018-06-07: 125 mg via INTRAVENOUS
  Filled 2018-06-07: qty 2

## 2018-06-07 NOTE — ED Triage Notes (Signed)
Pt states she has been feeling SOB for about a week. It is painful when she breathes, and has been having CP even while at rest. Denies fever. Pt complains of n/v. Denies known exposure to covid 19.

## 2018-06-07 NOTE — Discharge Instructions (Signed)
Be sure to use your albuterol inhaler every 4 hours continuously. Thank you for allowing me to care for you today. Please return to the emergency department if you have new or worsening symptoms. Take your medications as instructed.

## 2018-06-07 NOTE — ED Notes (Signed)
Patient ambulated in hallway on pulse ox. O2 saturation stayed between 95%-100%. This tech could hear the patient breathing heavier and wheezing.

## 2018-06-07 NOTE — ED Provider Notes (Signed)
[facility] EMERGENCY DEPARTMENT Provider Note   CSN: [id number] Arrival date & time: 06/07/18  1506    History   Chief Complaint Chief Complaint  Patient presents with  . Chest Pain  . Shortness of Breath    HPI [name] is a 53 y.o. female.     Patient is a 53 year old female with past medical history of asthma, myasthenia gravis, pulmonary embolism, presenting to the emergency department for chest pain and shortness of breath.  Patient reports that she has been short of breath for over a month.  Reports that she is usually on 2 L of home oxygen but that she is moving from one time to another and she has not been able to take her oxygen tank with her for 3 months.  Reports that her primary care doctor is getting her a portable oxygen tank.  Reports that her shortness of breath is worse with exertion.  Patient reports that the last 3 days she has had intermittent chest pain.  No exacerbating or relieving factors.  Reports that the chest pain is worse when she is short of breath.  Denies any leg swelling.  Reports that she has not missed any doses of her anticoagulation and is not having any leg pain or swelling.     Past Medical History:  Diagnosis Date  . Asthma   . Depression   . Heart murmur   . Myasthenia gravis (HCC)   . Obesity   . Pulmonary embolism (HCC)   . Sleep apnea   . Thyroid disease     Patient Active Problem List   Diagnosis Date Noted  . Pulmonary emboli (HCC) 08/19/2016  . Asthma exacerbation 08/16/2016  . Depression 06/20/2014  . Anxiety 06/20/2014  . Sleep apnea 06/20/2014  . GERD (gastroesophageal reflux disease) 12/28/2012  . Myasthenia gravis (HCC) 09/10/1992    Past Surgical History:  Procedure Laterality Date  . THYROID SURGERY    . TONSILLECTOMY    . TUBAL LIGATION       OB History   No obstetric history on file.      Home Medications    Prior to Admission medications   Medication Sig Start Date  End Date Taking? Authorizing Provider  acetaminophen (TYLENOL) 500 MG tablet Take 500 mg by mouth every 6 (six) hours as needed for mild pain.   Yes [provider]  albuterol (VENTOLIN HFA) 108 (90 Base) MCG/ACT inhaler Inhale 2 puffs into the lungs every 4 (four) hours as needed for wheezing or shortness of breath.  05/28/16  Yes [provider]  budesonide-formoterol (SYMBICORT) 160-4.5 MCG/ACT inhaler Inhale 2 puffs into the lungs 3 (three) times daily. 04/26/18  Yes [provider]  famotidine (PEPCID) 20 MG tablet Take 1 tablet (20 mg total) by mouth 2 (two) times daily. 03/09/18  Yes Henderly, Britni A, PA-C  fluticasone (FLONASE) 50 MCG/ACT nasal spray Place 1 spray into both nostrils daily.  05/30/15  Yes [provider]  furosemide (LASIX) 20 MG tablet Take 1 tablet (20 mg total) by mouth daily. 08/19/16 06/07/18 Yes Vann, Jessica U, DO  levothyroxine (SYNTHROID, LEVOTHROID) 112 MCG tablet Take 224 mcg by mouth daily. 07/02/16  Yes [provider]  lisinopril (PRINIVIL,ZESTRIL) 10 MG tablet Take 1 tablet (10 mg total) by mouth daily. 08/20/16  Yes Vann, Jessica U, DO  montelukast (SINGULAIR) 10 MG tablet Take 10 mg by mouth at bedtime.   Yes [provider]  mycophenolate (CELLCEPT) 500  MG tablet Take 1,000-1,500 mg by mouth See admin instructions. Take 2 tablets in the morning and 3 tablets at bedtime 06/01/16  Yes [provider]  omeprazole (PRILOSEC) 20 MG capsule Take 20 mg by mouth 2 (two) times daily. 05/28/16  Yes [provider]  PARoxetine (PAXIL) 30 MG tablet Take 60 mg by mouth daily.    Yes [provider]  potassium chloride (K-DUR) 10 MEQ tablet Take 1 tablet (10 mEq total) by mouth daily. 08/19/16  Yes Vann, Jessica U, DO  pyridostigmine (MESTINON) 60 MG tablet Take 30 mg by mouth 5 (five) times daily.  06/09/16  Yes [provider]  ipratropium-albuterol (DUONEB) 0.5-2.5 (3) MG/3ML SOLN Take 3 mLs by  nebulization 3 (three) times daily as needed (wheezing). Patient not taking: Reported on 06/07/2018 08/19/16   Joseph ArtVann, Jessica U, DO  ondansetron (ZOFRAN ODT) 4 MG disintegrating tablet Take 1 tablet (4 mg total) by mouth every 8 (eight) hours as needed for nausea or vomiting. Patient not taking: Reported on 06/07/2018 03/09/18   Henderly, Britni A, PA-C  Rivaroxaban 15 & 20 MG TBPK Take as directed on package: Start with one 15mg  tablet by mouth twice a day with food. On Day 22, switch to one 20mg  tablet once a day with food. Patient not taking: Reported on 06/07/2018 08/19/16   Joseph ArtVann, Jessica U, DO  traMADol (ULTRAM) 50 MG tablet Take 1 tablet (50 mg total) by mouth every 6 (six) hours as needed. Patient not taking: Reported on 06/07/2018 12/26/17   Linwood DibblesKnapp, Jon, MD    Family History History reviewed. No pertinent family history.  Social History Social History   Tobacco Use  . Smoking status: Former Games developermoker  . Smokeless tobacco: Never Used  Substance Use Topics  . Alcohol use: Yes  . Drug use: Never     Allergies   Patient has no known allergies.   Review of Systems Review of Systems  Constitutional: Negative for activity change, appetite change, chills, fatigue and fever.  HENT: Positive for congestion. Negative for ear pain and sore throat.   Eyes: Negative for pain and visual disturbance.  Respiratory: Positive for chest tightness, shortness of breath and wheezing. Negative for cough and stridor.   Cardiovascular: Positive for chest pain. Negative for palpitations and leg swelling.  Gastrointestinal: Negative for abdominal pain, nausea and vomiting.  Genitourinary: Negative for dysuria and hematuria.  Musculoskeletal: Negative for arthralgias and back pain.  Skin: Negative for color change and rash.  Neurological: Negative for dizziness, seizures, syncope and light-headedness.  All other systems reviewed and are negative.    Physical Exam Updated Vital Signs BP (!) 141/85 (BP  Location: Right Arm)   Pulse 77   Temp 98.9 F (37.2 C) (Oral)   Resp 18   Ht 5' (1.524 m)   Wt 117.9 kg   SpO2 98%   BMI 50.78 kg/m   Physical Exam Vitals signs reviewed.  Constitutional:      General: She is not in acute distress.    Appearance: She is well-developed. She is obese. She is not ill-appearing, toxic-appearing or diaphoretic.  HENT:     Head: Normocephalic.  Eyes:     Pupils: Pupils are equal, round, and reactive to light.  Cardiovascular:     Rate and Rhythm: Normal rate and regular rhythm.  Pulmonary:     Effort: Tachypnea present. No accessory muscle usage.     Breath sounds: Examination of the right-upper field reveals wheezing. Examination of the left-upper  field reveals wheezing. Examination of the right-middle field reveals wheezing. Examination of the left-middle field reveals wheezing. Examination of the right-lower field reveals wheezing. Examination of the left-lower field reveals wheezing. Wheezing present. No rhonchi or rales.  Abdominal:     General: Bowel sounds are normal.     Palpations: Abdomen is soft.  Skin:    General: Skin is warm.  Neurological:     Mental Status: She is alert.  Psychiatric:        Mood and Affect: Mood normal.      ED Treatments / Results  Labs (all labs ordered are listed, but only abnormal results are displayed) Labs Reviewed  BASIC METABOLIC PANEL - Abnormal; Notable for the following components:      Result Value   BUN 5 (*)    All other components within normal limits  CBC - Abnormal; Notable for the following components:   Hemoglobin 11.8 (*)    RDW 17.2 (*)    All other components within normal limits  SARS CORONAVIRUS 2 (HOSPITAL ORDER, PERFORMED IN [facility])  TROPONIN I  TROPONIN I  I-STAT BETA HCG BLOOD, ED ([facility], [facility], [facility] ONLY)    EKG None  Radiology Dg Chest 2 View  Result Date: 06/07/2018 CLINICAL DATA:  Chest pain EXAM: CHEST - 2 VIEW COMPARISON:  08/18/2018 FINDINGS: Post  sternotomy changes. No pleural effusion. Stable coarse chronic interstitial opacity with areas of linear scarring bilaterally. Small amount of right fissural fluid or thickening. Borderline cardiomegaly. No pneumothorax. IMPRESSION: Borderline to mild cardiomegaly. Slightly coarse interstitial pattern appears chronic. No definite acute interval change as compared with 08/17/2016. Electronically Signed   By: Jasmine Pang M.D.   On: 06/07/2018 16:53    Procedures Procedures (including critical care time)  Medications Ordered in ED Medications  sodium chloride flush (NS) 0.9 % injection 3 mL (3 mLs Intravenous Given 06/07/18 1836)  methylPREDNISolone sodium succinate (SOLU-MEDROL) 125 mg/2 mL injection 125 mg (125 mg Intravenous Given 06/07/18 1744)  albuterol (VENTOLIN HFA) 108 (90 Base) MCG/ACT inhaler 6 puff (6 puffs Inhalation Given 06/07/18 1744)     Initial Impression / Assessment and Plan / ED Course  I have reviewed the triage vital signs and the nursing notes.  Pertinent labs & imaging results that were available during my care of the patient were reviewed by me and considered in my medical decision making (see chart for details).  Clinical Course as of Jun 07 1428  Tue Jun 07, 2018  1818 I reassessed the patient at this time.  She reports that she is feeling significantly better with albuterol treatments and Solu-Medrol.  She is 99% on room air.  She reports that she does usually have home oxygen but she has not had it in 3 months due to her moving.  Reports that her primary care doctor states that she is going to get a portable oxygen tank soon.  I am going to consult case management to see if they can help and assist with that.  Patient ambulated and remained 95% on room air.  She is still mildly wheezy but is improved.  I am waiting on a second troponin.    [name] was evaluated in Emergency Department on 06/07/2018 for the symptoms described in the history of present  illness. She was evaluated in the context of the global COVID-19 pandemic, which necessitated consideration that the patient might be at risk for infection with the SARS-CoV-2 virus that causes COVID-19. Institutional  protocols and algorithms that pertain to the evaluation of patients at risk for COVID-19 are in a state of rapid change based on information released by regulatory bodies including the CDC and federal and state organizations. These policies and algorithms were followed during the patient's care in the ED.     [KM]  2012 Patient continued to be monitored and be continued to progressively improve.  Will send home with primary care doctor follow-up and strict return precautions.   [KM]    Clinical Course User Index [KM] Arlyn Dunning, PA-C       Based on review of vitals, medical screening exam, lab work and/or imaging, there does not appear to be an acute, emergent etiology for the patient's symptoms. Counseled pt on good return precautions and encouraged both PCP and ED follow-up as needed.  Prior to discharge, I also discussed incidental imaging findings with patient in detail and advised appropriate, recommended follow-up in detail.  Clinical Impression: 1. Moderate persistent asthma with exacerbation     Disposition: Discharge  Prior to providing a prescription for a controlled substance, I independently reviewed the patient's recent prescription history on the [place] Controlled Substance Reporting System. The patient had no recent or regular prescriptions and was deemed appropriate for a brief, less than 3 day prescription of narcotic for acute analgesia.  This note was prepared with assistance of Conservation officer, historic buildings. Occasional wrong-word or sound-a-like substitutions may have occurred due to the inherent limitations of voice recognition software.   Final Clinical Impressions(s) / ED Diagnoses   Final diagnoses:  Moderate persistent asthma with  exacerbation    ED Discharge Orders    None       Jeral Pinch 06/08/18 1431    Maia Plan, MD 06/08/18 [phone]

## 2018-09-09 ENCOUNTER — Other Ambulatory Visit: Payer: Self-pay

## 2018-09-09 ENCOUNTER — Ambulatory Visit (HOSPITAL_COMMUNITY): Payer: 59

## 2018-09-09 ENCOUNTER — Encounter (HOSPITAL_COMMUNITY): Payer: Self-pay | Admitting: Emergency Medicine

## 2018-09-09 ENCOUNTER — Emergency Department (HOSPITAL_COMMUNITY): Payer: 59

## 2018-09-09 ENCOUNTER — Emergency Department (HOSPITAL_COMMUNITY)
Admission: EM | Admit: 2018-09-09 | Discharge: 2018-09-09 | Disposition: A | Discharge status: Home / Self Care | Payer: 59 | Attending: Emergency Medicine | Admitting: Emergency Medicine

## 2018-09-09 DIAGNOSIS — R1011 Right upper quadrant pain: Secondary | ICD-10-CM | POA: Insufficient documentation

## 2018-09-09 DIAGNOSIS — Z20828 Contact with and (suspected) exposure to other viral communicable diseases: Secondary | ICD-10-CM | POA: Diagnosis not present

## 2018-09-09 DIAGNOSIS — J45909 Unspecified asthma, uncomplicated: Secondary | ICD-10-CM | POA: Diagnosis not present

## 2018-09-09 DIAGNOSIS — Z79899 Other long term (current) drug therapy: Secondary | ICD-10-CM | POA: Diagnosis not present

## 2018-09-09 DIAGNOSIS — Z87891 Personal history of nicotine dependence: Secondary | ICD-10-CM | POA: Diagnosis not present

## 2018-09-09 LAB — BASIC METABOLIC PANEL
Anion gap: 10 (ref 5–15)
BUN: 9 mg/dL (ref 6–20)
CO2: 27 mmol/L (ref 22–32)
Calcium: 9.4 mg/dL (ref 8.9–10.3)
Chloride: 102 mmol/L (ref 98–111)
Creatinine, Ser: 0.98 mg/dL (ref 0.44–1.00)
GFR calc Af Amer: >60 mL/min (ref >60)
GFR calc non Af Amer: >60 mL/min (ref >60)
Glucose, Bld: 91 mg/dL (ref 70–99)
Potassium: 4.4 mmol/L (ref 3.5–5.1)
Sodium: 139 mmol/L (ref 135–145)

## 2018-09-09 LAB — CBC
HCT: 36.7 % (ref 36.0–46.0)
Hemoglobin: 11.6 g/dL — ABNORMAL LOW (ref 12.0–15.0)
MCH: 27.8 pg (ref 26.0–34.0)
MCHC: 31.6 g/dL (ref 30.0–36.0)
MCV: 88 fL (ref 80.0–100.0)
Platelets: 222 10*3/uL (ref 150–400)
RBC: 4.17 MIL/uL (ref 3.87–5.11)
RDW: 15.1 % (ref 11.5–15.5)
WBC: 6.8 10*3/uL (ref 4.0–10.5)
nRBC: 0 % (ref 0.0–0.2)

## 2018-09-09 LAB — HEPATIC FUNCTION PANEL
ALT: 11 U/L (ref 0–44)
AST: 18 U/L (ref 15–41)
Albumin: 3.7 g/dL (ref 3.5–5.0)
Alkaline Phosphatase: 50 U/L (ref 38–126)
Bilirubin, Direct: <0.1 mg/dL (ref 0.0–0.2)
Total Bilirubin: 0.8 mg/dL (ref 0.3–1.2)
Total Protein: 7.1 g/dL (ref 6.5–8.1)

## 2018-09-09 LAB — LIPASE, BLOOD: Lipase: 25 U/L (ref 11–51)

## 2018-09-09 LAB — SARS CORONAVIRUS 2 BY RT PCR (HOSPITAL ORDER, PERFORMED IN ~~LOC~~ HOSPITAL LAB): SARS Coronavirus 2: NEGATIVE

## 2018-09-09 LAB — I-STAT BETA HCG BLOOD, ED (MC, WL, AP ONLY): I-stat hCG, quantitative: <5 m[IU]/mL (ref <5)

## 2018-09-09 LAB — TROPONIN I (HIGH SENSITIVITY): Troponin I (High Sensitivity): 5 ng/L (ref <18)

## 2018-09-09 MED ORDER — SODIUM CHLORIDE 0.9% FLUSH
3.0000 mL | Freq: Once | INTRAVENOUS | Status: AC
  Administered 2018-09-09: 3 mL via INTRAVENOUS

## 2018-09-09 MED ORDER — ONDANSETRON 4 MG PO TBDP: 4.0000 mg | ORAL_TABLET | Freq: Three times a day (TID) | ORAL | 0 refills | Status: DC | PRN

## 2018-09-09 MED ORDER — ONDANSETRON HCL 4 MG/2ML IJ SOLN
4.0000 mg | Freq: Once | INTRAMUSCULAR | Status: AC
  Administered 2018-09-09: 4 mg via INTRAVENOUS
  Filled 2018-09-09: qty 2

## 2018-09-09 NOTE — ED Triage Notes (Addendum)
Pt with multiple symptoms chest pain, cough, shortness of breath, she also reports nausea and vomiting since Sunday. Denies fevers but has had diarrhea. At triage she wants to take her medication for myasthenia gravis (she was advised not as she is currently dry heaving). Pt is alert and oriented x4.   Addendum: when discussing covid-19 symptoms pt states "I actually have all of the symptoms and I have not been tested for it."  Swab collected at triage.

## 2018-09-09 NOTE — ED Provider Notes (Signed)
[facility] EMERGENCY [facility]PARTMENT Provider Note   CSN: [id number] Arrival date & time: 09/09/18  1402     History   Chief Complaint Chief Complaint  Patient presents with  . Chest Pain  . Emesis  . Diarrhea  . Poss Covid    HPI [name] is a 53 y.o. female with history of asthma, myasthenia gravis, obesity, and PE presenting to the ED complaining of cough, shortness of breath, abdominal pain, nausea, vomiting, diarrhea for the past 5 days.  Patient reports her symptoms began on Sunday.  She reports the cough and shortness of breath feels similar to prior asthma exacerbations.  She reports these are relieved by her albuterol inhaler and nebulizer treatments.  She also reports nausea and approximately 3 episodes of emesis per day throughout this time.  She endorses right upper quadrant pain that is worse with eating.  She also endorses several episodes of diarrhea since Sunday.  She denies having any fevers or known sick contacts.  Patient endorses some central chest pain that started after her cough that is only present when coughing.     The history is provided by the patient.    Past Medical History:  Diagnosis Date  . Asthma   . Depression   . Heart murmur   . Myasthenia gravis (HCC)   . Obesity   . Pulmonary embolism (HCC)   . Sleep apnea   . Thyroid disease     Patient Active Problem List   Diagnosis Date Noted  . Pulmonary emboli (HCC) 08/19/2016  . Asthma exacerbation 08/16/2016  . Depression 06/20/2014  . Anxiety 06/20/2014  . Sleep apnea 06/20/2014  . GERD (gastroesophageal reflux disease) 12/28/2012  . Myasthenia gravis (HCC) 09/10/1992    Past Surgical History:  Procedure Laterality Date  . THYROID SURGERY    . TONSILLECTOMY    . TUBAL LIGATION       OB History   No obstetric history on file.      Home Medications    Prior to Admission medications   Medication Sig Start Date End Date Taking? Authorizing Provider   acetaminophen (TYLENOL) 500 MG tablet Take 500 mg by mouth every 6 (six) hours as needed for mild pain.    [provider]  albuterol (VENTOLIN HFA) 108 (90 Base) MCG/ACT inhaler Inhale 2 puffs into the lungs every 4 (four) hours as needed for wheezing or shortness of breath.  05/28/16   [provider]  budesonide-formoterol (SYMBICORT) 160-4.5 MCG/ACT inhaler Inhale 2 puffs into the lungs 3 (three) times daily. 04/26/18   [provider]  famotidine (PEPCID) 20 MG tablet Take 1 tablet (20 mg total) by mouth 2 (two) times daily. 03/09/18   Henderly, Britni A, PA-C  fluticasone (FLONASE) 50 MCG/ACT nasal spray Place 1 spray into both nostrils daily.  05/30/15   [provider]  furosemide (LASIX) 20 MG tablet Take 1 tablet (20 mg total) by mouth daily. 08/19/16 06/07/18  Joseph ArtVann, Jessica U, DO  ipratropium-albuterol (DUONEB) 0.5-2.5 (3) MG/3ML SOLN Take 3 mLs by nebulization 3 (three) times daily as needed (wheezing). Patient not taking: Reported on 06/07/2018 08/19/16   Joseph ArtVann, Jessica U, DO  levothyroxine (SYNTHROID, LEVOTHROID) 112 MCG tablet Take 224 mcg by mouth daily. 07/02/16   [provider]  lisinopril (PRINIVIL,ZESTRIL) 10 MG tablet Take 1 tablet (10 mg total) by mouth daily. 08/20/16   Joseph ArtVann, Jessica U, DO  montelukast (SINGULAIR) 10 MG tablet Take 10 mg by mouth at  bedtime.    [provider]  mycophenolate (CELLCEPT) 500 MG tablet Take 1,000-1,500 mg by mouth See admin instructions. Take 2 tablets in the morning and 3 tablets at bedtime 06/01/16   [provider]  omeprazole (PRILOSEC) 20 MG capsule Take 20 mg by mouth 2 (two) times daily. 05/28/16   [provider]  ondansetron (ZOFRAN ODT) 4 MG disintegrating tablet Take 1 tablet (4 mg total) by mouth every 8 (eight) hours as needed for nausea or vomiting. 09/09/18   Candie Chroman, MD  PARoxetine (PAXIL) 30 MG tablet Take 60 mg by mouth daily.     [provider]  potassium  chloride (K-DUR) 10 MEQ tablet Take 1 tablet (10 mEq total) by mouth daily. 08/19/16   Geradine Girt, DO  pyridostigmine (MESTINON) 60 MG tablet Take 30 mg by mouth 5 (five) times daily.  06/09/16   [provider]  Rivaroxaban 15 & 20 MG TBPK Take as directed on package: Start with one 15mg  tablet by mouth twice a day with food. On Day 22, switch to one 20mg  tablet once a day with food. Patient not taking: Reported on 06/07/2018 08/19/16   Geradine Girt, DO  traMADol (ULTRAM) 50 MG tablet Take 1 tablet (50 mg total) by mouth every 6 (six) hours as needed. Patient not taking: Reported on 06/07/2018 12/26/17   Dorie Rank, MD    Family History No family history on file.  Social History Social History   Tobacco Use  . Smoking status: Former Research scientist (life sciences)  . Smokeless tobacco: Never Used  Substance Use Topics  . Alcohol use: Yes  . Drug use: Never     Allergies   Patient has no known allergies.   Review of Systems Review of Systems  Constitutional: Positive for fatigue. Negative for chills and fever.  HENT: Negative for ear pain and sore throat.   Eyes: Negative for pain and visual disturbance.  Respiratory: Positive for cough and shortness of breath.   Cardiovascular: Positive for chest pain (with coughing). Negative for palpitations.  Gastrointestinal: Positive for abdominal pain, diarrhea, nausea and vomiting.  Genitourinary: Negative for dysuria, frequency and hematuria.  Musculoskeletal: Negative for arthralgias and back pain.  Skin: Negative for color change and rash.  Neurological: Negative for seizures and syncope.  Psychiatric/Behavioral: Negative for agitation and behavioral problems.  All other systems reviewed and are negative.    Physical Exam Updated Vital Signs BP (!) 148/50 (BP Location: Right Arm)   Pulse 60   Resp 20   SpO2 96%   Physical Exam Vitals signs and nursing note reviewed.  Constitutional:      General: She is not in acute distress.     Appearance: She is well-developed. She is obese. She is not ill-appearing, toxic-appearing or diaphoretic.  HENT:     Head: Normocephalic and atraumatic.     Nose: Nose normal. No congestion or rhinorrhea.     Mouth/Throat:     Mouth: Mucous membranes are moist.     Pharynx: Oropharynx is clear. No oropharyngeal exudate or posterior oropharyngeal erythema.  Eyes:     Conjunctiva/sclera: Conjunctivae normal.     Pupils: Pupils are equal, round, and reactive to light.     Comments: Strabismus of right eye  Neck:     Musculoskeletal: Normal range of motion and neck supple.  Cardiovascular:     Rate and Rhythm: Regular rhythm. Bradycardia present.     Heart sounds: Normal heart sounds. No murmur. No friction rub.  No gallop.   Pulmonary:     Effort: Pulmonary effort is normal. No respiratory distress.     Breath sounds: No stridor. Wheezing (faint expiratory wheezing) present. No rhonchi or rales.  Abdominal:     General: Abdomen is flat. There is no distension.     Palpations: Abdomen is soft.     Tenderness: There is abdominal tenderness (significant RUQ tenderness). There is no rebound.  Musculoskeletal: Normal range of motion.        General: No swelling, tenderness, deformity or signs of injury.  Skin:    General: Skin is warm and dry.  Neurological:     General: No focal deficit present.     Mental Status: She is alert and oriented to person, place, and time. Mental status is at baseline.     Cranial Nerves: No cranial nerve deficit.     Sensory: No sensory deficit.     Motor: No weakness.  Psychiatric:        Mood and Affect: Mood normal.        Behavior: Behavior normal.      ED Treatments / Results  Labs (all labs ordered are listed, but only abnormal results are displayed) Labs Reviewed  CBC - Abnormal; Notable for the following components:      Result Value   Hemoglobin 11.6 (*)    All other components within normal limits  SARS CORONAVIRUS 2 (HOSPITAL ORDER,  PERFORMED IN [facility])  BASIC METABOLIC PANEL  HEPATIC FUNCTION PANEL  LIPASE, BLOOD  I-STAT BETA HCG BLOOD, ED ([facility], [facility], [facility] ONLY)  TROPONIN I (HIGH SENSITIVITY)    EKG EKG Interpretation  Date/Time:  Friday September 09 2018 14:26:42 EDT Ventricular Rate:  50 PR Interval:  150 QRS Duration: 76 QT Interval:  450 QTC Calculation: 410 R Axis:   12 Text Interpretation:  Sinus bradycardia Low voltage QRS Septal infarct , age undetermined Abnormal ECG Since last tracing Poor R wave progression seen Confirmed by Eber HongMiller, Brian ([id number]) on 09/09/2018 4:12:27 PM   Radiology Dg Chest Portable 1 View  Result Date: 09/09/2018 CLINICAL DATA:  Reason for exam: CP, cough, NVD Patient reports center chest pains, cough, sore throat, sob, NVD with blood seen in stool. Symptoms started Sunday. Hx of prior PE, heart murmur, asthma. EXAM: PORTABLE CHEST 1 VIEW COMPARISON:  06/07/2018 FINDINGS: Median sternotomy and CABG. Heart is enlarged and stable in configuration. Minimal LEFT LOWER lobe atelectasis or scarring. There are no focal consolidations or pleural effusions. No pulmonary edema. IMPRESSION: 1. Stable cardiomegaly. 2. No evidence for acute cardiopulmonary abnormality. Electronically Signed   By: Norva PavlovElizabeth  Brown M.D.   On: 09/09/2018 17:04   [place] Abdomen Limited Ruq  Result Date: 09/09/2018 CLINICAL DATA:  Right upper quadrant abdominal pain EXAM: ULTRASOUND ABDOMEN LIMITED RIGHT UPPER QUADRANT COMPARISON:  CT abdomen pelvis 03/09/2018 FINDINGS: Gallbladder: No gallstones or wall thickening visualized. No sonographic Murphy sign noted by sonographer. Common bile duct: Diameter: 2.8 Liver: No focal lesion identified. Within normal limits in parenchymal echogenicity. Portal vein is patent on color Doppler imaging with normal direction of blood flow towards the liver. Other: 2.5 x 2.5 x 2.1 cm right renal cyst with thin internal septation, similar to prior CT. IMPRESSION: No evidence of acute  cholecystitis or biliary ductal dilatation. Incidental note made of a 2.5 cm right renal cyst fall visualized on prior CT. Bosniak II. No further workup is warranted. Electronically Signed   By: Kreg ShropshirePrice  DeHay M.D.   On:  09/09/2018 17:43    Procedures Procedures (including critical care time)  Medications Ordered in ED Medications  sodium chloride flush (NS) 0.9 % injection 3 mL (3 mLs Intravenous Given 09/09/18 1807)  ondansetron (ZOFRAN) injection 4 mg (4 mg Intravenous Given 09/09/18 1812)     Initial Impression / Assessment and Plan / ED Course  I have reviewed the triage vital signs and the nursing notes.  Pertinent labs & imaging results that were available during my care of the patient were reviewed by me and considered in my medical decision making (see chart for details).        [name] is a 53 y.o. female with history of asthma, myasthenia gravis, obesity, and PE presenting to the ED complaining of cough, shortness of breath, abdominal pain, nausea, vomiting, diarrhea for the past 5 days.  On exam, patient has significant right upper quadrant tenderness without guarding or rebound.  BMP is unremarkable.  CBC shows no evidence of leukocytosis.  COVID-19 swab was obtained and is negative.  Hepatic function panel and lipase were added to labs obtained in triage and were unremarkable.  Right upper quadrant ultrasound was ordered and showed no evidence of cholecystitis or cholelithiasis.  On reassessment, patient reports she is feeling better after Zofran administration.  Her abdomen remains soft and her tenderness has improved.  Patient was discharged home in stable condition advised to follow-up closely with her primary care physician.  She was given a prescription for Zofran for her nausea.  She was advised to continue her albuterol inhaler and nebulizer treatments at home for her asthma.   Final Clinical Impressions(s) / ED Diagnoses   Final diagnoses:  RUQ abdominal  pain    ED Discharge Orders         Ordered    ondansetron (ZOFRAN ODT) 4 MG disintegrating tablet  Every 8 hours PRN     09/09/18 2006           Garry HeaterEames, Uzziah Rigg, MD 09/09/18 2352    Eber HongMiller, Brian, MD 09/10/18 [phone]

## 2018-09-09 NOTE — ED Provider Notes (Signed)
I saw and evaluated the patient, reviewed the resident's note and I agree with the findings and plan.  Pertinent History: The pt presents with conglomerate of symptoms including chest pain, nausea and coughing with some intermittent light blood in her watery stools (diarrhea), she has mild RUQ ttp on abdominal exam - lungs clear, VS normal - no fever, hypoxia or tachyardia - labs reassurin and covid negative, anticipate d/c unless RUQ US shows GB.  I was personally present and directly supervised the following procedures:  Medical evaluation  I personally interpreted the EKG as well as the resident and agree with the interpretation on the resident's chart.  Final diagnoses:  RUQ abdominal pain      Noemi Chapel, MD 09/10/18 1446

## 2018-09-09 NOTE — ED Notes (Signed)
Pt ambulated to the bathroom.  

## 2018-11-21 DIAGNOSIS — Z86711 Personal history of pulmonary embolism: Secondary | ICD-10-CM | POA: Insufficient documentation

## 2018-12-21 ENCOUNTER — Other Ambulatory Visit: Payer: Self-pay

## 2018-12-21 ENCOUNTER — Emergency Department (HOSPITAL_COMMUNITY)
Admission: EM | Admit: 2018-12-21 | Discharge: 2018-12-21 | Disposition: A | Discharge status: Home / Self Care | Payer: 59 | Attending: Emergency Medicine | Admitting: Emergency Medicine

## 2018-12-21 ENCOUNTER — Emergency Department (HOSPITAL_COMMUNITY): Payer: 59

## 2018-12-21 ENCOUNTER — Encounter (HOSPITAL_COMMUNITY): Payer: Self-pay

## 2018-12-21 DIAGNOSIS — Z79899 Other long term (current) drug therapy: Secondary | ICD-10-CM | POA: Insufficient documentation

## 2018-12-21 DIAGNOSIS — Z86711 Personal history of pulmonary embolism: Secondary | ICD-10-CM | POA: Insufficient documentation

## 2018-12-21 DIAGNOSIS — Z8709 Personal history of other diseases of the respiratory system: Secondary | ICD-10-CM | POA: Insufficient documentation

## 2018-12-21 DIAGNOSIS — E079 Disorder of thyroid, unspecified: Secondary | ICD-10-CM | POA: Insufficient documentation

## 2018-12-21 DIAGNOSIS — Z87891 Personal history of nicotine dependence: Secondary | ICD-10-CM | POA: Insufficient documentation

## 2018-12-21 DIAGNOSIS — G7000 Myasthenia gravis without (acute) exacerbation: Secondary | ICD-10-CM | POA: Diagnosis not present

## 2018-12-21 DIAGNOSIS — R0789 Other chest pain: Secondary | ICD-10-CM | POA: Diagnosis not present

## 2018-12-21 DIAGNOSIS — R0600 Dyspnea, unspecified: Secondary | ICD-10-CM | POA: Diagnosis present

## 2018-12-21 LAB — CBC
HCT: 34.8 % — ABNORMAL LOW (ref 36.0–46.0)
Hemoglobin: 10.8 g/dL — ABNORMAL LOW (ref 12.0–15.0)
MCH: 26.4 pg (ref 26.0–34.0)
MCHC: 31 g/dL (ref 30.0–36.0)
MCV: 85.1 fL (ref 80.0–100.0)
Platelets: 238 10*3/uL (ref 150–400)
RBC: 4.09 MIL/uL (ref 3.87–5.11)
RDW: 15 % (ref 11.5–15.5)
WBC: 6.9 10*3/uL (ref 4.0–10.5)
nRBC: 0 % (ref 0.0–0.2)

## 2018-12-21 LAB — BASIC METABOLIC PANEL
Anion gap: 8 (ref 5–15)
BUN: 6 mg/dL (ref 6–20)
CO2: 29 mmol/L (ref 22–32)
Calcium: 9.2 mg/dL (ref 8.9–10.3)
Chloride: 102 mmol/L (ref 98–111)
Creatinine, Ser: 0.77 mg/dL (ref 0.44–1.00)
GFR calc Af Amer: >60 mL/min (ref >60)
GFR calc non Af Amer: >60 mL/min (ref >60)
Glucose, Bld: 95 mg/dL (ref 70–99)
Potassium: 4.7 mmol/L (ref 3.5–5.1)
Sodium: 139 mmol/L (ref 135–145)

## 2018-12-21 LAB — TROPONIN I (HIGH SENSITIVITY): Troponin I (High Sensitivity): 7 ng/L (ref <18)

## 2018-12-21 MED ORDER — ACETAMINOPHEN 325 MG PO TABS
650.0000 mg | ORAL_TABLET | Freq: Once | ORAL | Status: AC
  Administered 2018-12-21: 650 mg via ORAL
  Filled 2018-12-21: qty 2

## 2018-12-21 MED ORDER — IOHEXOL 350 MG/ML SOLN
100.0000 mL | Freq: Once | INTRAVENOUS | Status: AC | PRN
  Administered 2018-12-21: 17:00:00 100 mL via INTRAVENOUS

## 2018-12-21 MED ORDER — METHYLPREDNISOLONE SODIUM SUCC 125 MG IJ SOLR
125.0000 mg | Freq: Once | INTRAMUSCULAR | Status: AC
  Administered 2018-12-21: 15:00:00 125 mg via INTRAVENOUS
  Filled 2018-12-21: qty 2

## 2018-12-21 MED ORDER — ALBUTEROL SULFATE HFA 108 (90 BASE) MCG/ACT IN AERS
4.0000 | INHALATION_SPRAY | Freq: Once | RESPIRATORY_TRACT | Status: AC
  Administered 2018-12-21: 4 via RESPIRATORY_TRACT
  Filled 2018-12-21: qty 6.7

## 2018-12-21 MED ORDER — SODIUM CHLORIDE 0.9% FLUSH
3.0000 mL | Freq: Once | INTRAVENOUS | Status: AC
  Administered 2018-12-21: 3 mL via INTRAVENOUS

## 2018-12-21 NOTE — ED Notes (Signed)
I found the pts bed empty she had pulled out her iv left it on the table   She left without her discharge papers  The pa and edp saw her leave  While I was in another room

## 2018-12-21 NOTE — ED Triage Notes (Signed)
Patient complains of 3 weeks of CP/ SOB. Was seen at [facility] yesterday and tested negative for Covid. States that she was told she has fluid and received lasix. Patient coughing on arrival

## 2018-12-21 NOTE — ED Provider Notes (Signed)
  Face-to-face evaluation   History: Patient here for evaluation of shortness of breath.  I evaluated her at 6:45 PM.  At this time she was tearful, upset, and stated that she wanted to leave.  She was actively removing her IV.  I explained to her that her PE study was negative.  I explained that we were waiting for a test, NIF, to evaluate her myasthenia.  At that point she said "no you're not."  Physical exam: Obese female alert and cooperative.  No respiratory distress.  She appears upset and is tearful.  Vital signs have trended fairly normal, mild tachypnea on the most recent, however oxygen saturation normal 96% on room air.  At this point there is no indication or reason to hold her here against her wishes.  She will be discharged, with recommendation to continue her current care.  Medical screening examination/treatment/procedure(s) were conducted as a shared visit with non-physician practitioner(s) and myself.  I personally evaluated the patient during the encounter   Daleen Bo, MD 12/23/18 1202

## 2018-12-21 NOTE — ED Provider Notes (Signed)
[facility] EMERGENCY DEPARTMENT Provider Note   CSN: [id number] Arrival date & time: 12/21/18  1005     History   Chief Complaint Chief Complaint  Patient presents with  . SOB/ CP    HPI [name] is a 53 y.o. female past history of asthma, depression, myasthenia gravis, pulmonary embolism who presents for evaluation of chest pain and difficulty breathing.  She states that this is been an ongoing issue for the last 2 to 3 weeks.  She states she felt like it has gotten progressively worse.  She went to the [facility] emergency department yesterday for evaluation of symptoms.  While there, she got medications what sounds like breathing treatments and steroids.  She stated that it helped with her symptoms but that it returned at about 3 AM this morning.  She states that the chest pain is on the left side of her chest and wraps around the side.  She states it is worse with deep inspiration.  She also felt like she has had worsening shortness of breath.  She states she has had a cough but it is not productive.  She has not noted any fevers.  She states she was tested for Covid yesterday and it was negative.  She has not had any known COVID-19 exposure or recent travel.  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  She thinks that her DVT was from a long plane trip that she was on.  She is unsure of what exactly caused her PE.  She was on Xarelto for about a year but she is not currently on any blood thinners.  She denies any exogenous hormone use, recent travel, leg swelling, recent hospitalizations or surgeries.  Patient smokes about 4 cigarettes a day.     The history is provided by the patient.    Past Medical History:  Diagnosis Date  . Asthma   . Depression   . Heart murmur   . Myasthenia gravis ([place])   . Obesity   . Pulmonary embolism ([place])   . Sleep apnea   . Thyroid disease     Patient Active Problem List   Diagnosis Date Noted  . Pulmonary emboli ([place])  08/19/2016  . Asthma exacerbation 08/16/2016  . Depression 06/20/2014  . Anxiety 06/20/2014  . Sleep apnea 06/20/2014  . GERD (gastroesophageal reflux disease) 12/28/2012  . Myasthenia gravis ([place]) 09/10/1992    Past Surgical History:  Procedure Laterality Date  . THYROID SURGERY    . TONSILLECTOMY    . TUBAL LIGATION       OB History   No obstetric history on file.      Home Medications    Prior to Admission medications   Medication Sig Start Date End Date Taking? Authorizing Provider  acetaminophen (TYLENOL) 500 MG tablet Take 500 mg by mouth every 6 (six) hours as needed for mild pain.   Yes [provider]  albuterol (VENTOLIN HFA) 108 (90 Base) MCG/ACT inhaler Inhale 2 puffs into the lungs every 4 (four) hours as needed for wheezing or shortness of breath.  05/28/16  Yes [provider]  budesonide-formoterol (SYMBICORT) 160-4.5 MCG/ACT inhaler Inhale 2 puffs into the lungs 3 (three) times daily. 04/26/18  Yes [provider]  famotidine (PEPCID) 20 MG tablet Take 1 tablet (20 mg total) by mouth 2 (two) times daily. 03/09/18  Yes Henderly, Britni A, PA-C  fluticasone (FLONASE) 50 MCG/ACT nasal spray Place 1 spray into both nostrils daily.  05/30/15  Yes [provider]  furosemide (LASIX) 20 MG tablet Take 1 tablet (20 mg total) by mouth daily. 08/19/16 12/21/18 Yes Vann, Jessica U, DO  ipratropium-albuterol (DUONEB) 0.5-2.5 (3) MG/3ML SOLN Take 3 mLs by nebulization 3 (three) times daily as needed (wheezing). 08/19/16  Yes Vann, Jessica U, DO  levothyroxine (SYNTHROID, LEVOTHROID) 112 MCG tablet Take 224 mcg by mouth daily. 07/02/16  Yes [provider]  lisinopril (PRINIVIL,ZESTRIL) 10 MG tablet Take 1 tablet (10 mg total) by mouth daily. 08/20/16  Yes Vann, Jessica U, DO  montelukast (SINGULAIR) 10 MG tablet Take 10 mg by mouth at bedtime.   Yes [provider]  mycophenolate (CELLCEPT) 500 MG tablet Take 1,000-1,500 mg by mouth See  admin instructions. Take 2 tablets in the morning and 3 tablets at bedtime 06/01/16  Yes [provider]  omeprazole (PRILOSEC) 20 MG capsule Take 20 mg by mouth 2 (two) times daily. 05/28/16  Yes [provider]  ondansetron (ZOFRAN ODT) 4 MG disintegrating tablet Take 1 tablet (4 mg total) by mouth every 8 (eight) hours as needed for nausea or vomiting. 09/09/18  Yes Garry Heater, MD  PARoxetine (PAXIL) 30 MG tablet Take 60 mg by mouth daily.    Yes [provider]  potassium chloride (K-DUR) 10 MEQ tablet Take 1 tablet (10 mEq total) by mouth daily. 08/19/16  Yes Vann, Jessica U, DO  pyridostigmine (MESTINON) 60 MG tablet Take 30 mg by mouth 5 (five) times daily.  06/09/16  Yes [provider]  SPIRIVA RESPIMAT 1.25 MCG/ACT AERS Inhale 1-2 puffs into the lungs daily as needed for shortness of breath. 09/15/18  Yes [provider]  Rivaroxaban 15 & 20 MG TBPK Take as directed on package: Start with one  tablet by mouth twice a day with food. On Day 22, switch to one  tablet once a day with food. Patient not taking: Reported on 06/07/2018 08/19/16   Joseph Art, DO  traMADol (ULTRAM) 50 MG tablet Take 1 tablet (50 mg total) by mouth every 6 (six) hours as needed. Patient not taking: Reported on 06/07/2018 12/26/17   Linwood Dibbles, MD    Family History No family history on file.  Social History Social History   Tobacco Use  . Smoking status: Former Games developer  . Smokeless tobacco: Never Used  Substance Use Topics  . Alcohol use: Yes  . Drug use: Never     Allergies   Patient has no known allergies.   Review of Systems Review of Systems  Constitutional: Negative for fever.  Respiratory: Positive for shortness of breath. Negative for cough.   Cardiovascular: Positive for chest pain. Negative for leg swelling.  Gastrointestinal: Negative for abdominal pain, nausea and vomiting.  Genitourinary: Negative for dysuria and hematuria.   Neurological: Negative for headaches.  All other systems reviewed and are negative.    Physical Exam Updated Vital Signs BP (!) 118/58   Pulse 63   Temp 98.8 F (37.1 C) (Oral)   Resp 20   SpO2 98%   Physical Exam Vitals signs and nursing note reviewed.  Constitutional:      Appearance: Normal appearance. She is well-developed.  HENT:     Head: Normocephalic and atraumatic.  Eyes:     General: Lids are normal.     Conjunctiva/sclera: Conjunctivae normal.     Pupils: Pupils are equal, round, and reactive to light.  Neck:     Musculoskeletal: Full passive range of motion without pain.  Cardiovascular:     Rate and Rhythm: Normal rate and regular rhythm.     Pulses: Normal pulses.          Radial pulses are 2+ on the right side and 2+ on the left side.       Dorsalis pedis pulses are 2+ on the right side and 2+ on the left side.     Heart sounds: Normal heart sounds. No murmur. No friction rub. No gallop.   Pulmonary:     Effort: Pulmonary effort is normal.     Breath sounds: Normal breath sounds.     Comments: Speaks in medium sentences. Rhonchourous cough with some expiratory wheezing noted.  Abdominal:     Palpations: Abdomen is soft. Abdomen is not rigid.     Tenderness: There is no abdominal tenderness. There is no guarding.  Musculoskeletal: Normal range of motion.     Comments: BLE are symmetric in appearance and without any overlying warmth, erythema, edema.   Skin:    General: Skin is warm and dry.     Capillary Refill: Capillary refill takes less than 2 seconds.  Neurological:     Mental Status: She is alert and oriented to person, place, and time.  Psychiatric:        Speech: Speech normal.      ED Treatments / Results  Labs (all labs ordered are listed, but only abnormal results are displayed) Labs Reviewed  CBC - Abnormal; Notable for the following components:      Result Value   Hemoglobin 10.8 (*)    HCT 34.8 (*)    All other components within  normal limits  BASIC METABOLIC PANEL  TROPONIN I (HIGH SENSITIVITY)    EKG EKG Interpretation  Date/Time:  Wednesday December 21 2018 14:08:33 EST Ventricular Rate:  59 PR Interval:    QRS Duration: 85 QT Interval:  447 QTC Calculation: 443 R Axis:   27 Text Interpretation: Age not entered, assumed to be  53 years old for purpose of ECG interpretation Sinus rhythm Atrial premature complex Low voltage, precordial leads Probable anteroseptal infarct, old unremarkable  ecg Confirmed by Gerhard Munch [phone]) on 12/21/2018 2:12:41 PM   Radiology Dg Chest 2 View  Result Date: 12/21/2018 CLINICAL DATA:  Cough for a week.  Fever. EXAM: CHEST - 2 VIEW COMPARISON:  09/09/2018 FINDINGS: There are stable changes from prior cardiac surgery. Cardiac silhouette is normal in size and configuration. No mediastinal or hilar masses or evidence of adenopathy. Lungs demonstrate prominent bronchovascular markings. Linear scarring is noted in the left upper lobe lingula, stable. Lungs are otherwise clear. No pleural effusion or pneumothorax. Skeletal structures are intact. IMPRESSION: No acute cardiopulmonary disease. Electronically Signed   By: Amie Portland M.D.   On: 12/21/2018 11:37    Procedures Procedures (including critical care time)  Medications Ordered in ED Medications  iohexol (OMNIPAQUE) 350 MG/ML injection 100 mL (has no administration in time range)  sodium chloride flush (NS) 0.9 % injection 3 mL (3 mLs Intravenous Given 12/21/18 1501)  methylPREDNISolone sodium succinate (SOLU-MEDROL) 125 mg/2 mL injection 125 mg (125 mg Intravenous Given 12/21/18 1501)  albuterol (VENTOLIN HFA) 108 (90 Base) MCG/ACT inhaler 4 puff (4 puffs Inhalation Given 12/21/18 1501)     Initial Impression / Assessment and Plan / ED Course  I have reviewed the triage vital signs and the nursing notes.  Pertinent labs & imaging results that were available during my care of the patient were reviewed by me and  considered in my medical decision making (see chart for details).        53 y.o. F who presents for evaluation of chest pain and shortness of breath been ongoing for last 3 weeks.  She reports [facility] for evaluation of same symptoms.  She has had felt slightly better but states that symptoms returned about 4 AM this morning.  Reports the pain is elicited for chest and wraps around to her side.  She reports it is worse with deep inspiration.  She is not any recent fevers or known COVID-19 contacts.  Initial ED arrival, she is afebrile, nontoxic-appearing.  Vital signs are stable.  On exam, she has a rhonchorous cough with wheezing noted.  Concern for infectious process versus asthma exacerbation.  Also concern for myasthenia gravis given her history.  Additionally, she has a history of PE and she is reporting some pleuritic chest pain.  She is not currently on any blood thinners.  Plan to check labs, imaging.  Troponin is negative.  BMP is unremarkable.  CBC shows no leukocytosis.  Hemoglobin is 10.8.  Chest x-ray negative for any infectious etiology.  Patient signed out to Terance HartKelly Gekas, PA-C with CTA and NIF and vital capacity pending.   Portions of this note were generated with Scientist, clinical (histocompatibility and immunogenetics)Dragon dictation software. Dictation errors may occur despite best attempts at proofreading.  Final Clinical Impressions(s) / ED Diagnoses   Final diagnoses:  None    ED Discharge Orders    None       Rosana HoesLayden, Lindsey A, PA-C 12/21/18 1620    Gerhard MunchLockwood, Robert, MD 12/23/18 1008

## 2018-12-21 NOTE — ED Notes (Signed)
Pt c/o a headache  No history   Breathing no netter

## 2018-12-21 NOTE — ED Provider Notes (Signed)
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 Seen at [facility] and treated for asthma exacerbation. Vitals are reassuring. Wheezing and rhonchi on exam. Pending CTA to r/o PE and inspiratory study to f/o MG flare  CTA is negative for PE but shows hazy opacity which could be edema or pneumonia. Discussed with respiratory regarding NIF test. They are in a code but will come to see patient.  6:51 PM Informed by pt's RN she is upset because she hasn't seen a doctor and has been here for hours and hasn't had any medicines. Dr. Eulis Foster went to go see pt. Please see his note for encounter    Recardo Evangelist, PA-C 12/21/18 1853    Daleen Bo, MD 12/23/18 [phone]

## 2018-12-21 NOTE — ED Notes (Signed)
The pt is sleeping  Respirations sound good and even while sleeping

## 2019-02-17 ENCOUNTER — Ambulatory Visit: Payer: 59 | Attending: Internal Medicine

## 2019-02-17 DIAGNOSIS — Z20822 Contact with and (suspected) exposure to covid-19: Secondary | ICD-10-CM

## 2019-02-18 LAB — NOVEL CORONAVIRUS, NAA: SARS-CoV-2, NAA: DETECTED — AB

## 2019-02-19 ENCOUNTER — Telehealth: Payer: Self-pay | Admitting: Infectious Diseases

## 2019-02-19 ENCOUNTER — Encounter: Payer: Self-pay | Admitting: Infectious Diseases

## 2019-02-19 NOTE — Telephone Encounter (Signed)
Called to discuss with patient about Covid symptoms and the use of bamlanivimab, a monoclonal antibody infusion for those with mild to moderate Covid symptoms and at a high risk of hospitalization.  Pt is qualified for this infusion at the [facility] due to BMI>35   Message left to call back  

## 2019-02-20 ENCOUNTER — Telehealth: Payer: Self-pay | Admitting: Nurse Practitioner

## 2019-02-20 ENCOUNTER — Ambulatory Visit: Payer: Self-pay | Admitting: *Deleted

## 2019-02-20 NOTE — Telephone Encounter (Signed)
Pt called stating someone had called her about being positive. Pt missed the call from T.Nichols, [place] regarding the monoclonal infusion. She is advised of the recommendation of being positive with covid 19. She is already in quarantine with her niece. She is advised of the symptoms. She denied symptoms at first but then she developed a dry cough. She is advised to hydrate, get rest and fresh air. Advise that respiratory distress or struggling to breath is a 911 call and also worsening symptoms. She voiced understanding. Attempted to transfer the call to Ms. Nichols. Not available but was told that someone would call the patient back regarding the infusion. She voiced understanding.

## 2019-02-20 NOTE — Telephone Encounter (Signed)
Called to Discuss with patient about Covid symptoms and the use of bamlanivimab, a monoclonal antibody infusion for those with mild to moderate Covid symptoms and at a high risk of hospitalization.     Pt is qualified for this infusion at the [facility] due to co-morbid conditions and/or a member of an at-risk group.     Unable to reach pt  

## 2019-02-20 NOTE — Telephone Encounter (Signed)
Attempted to contact patient and further discuss consideration for monoclonal antibody infusion.   Unable to reach patient. HIPPA appropriate voicemail left.

## 2019-03-06 ENCOUNTER — Ambulatory Visit: Payer: 59 | Attending: Internal Medicine

## 2019-03-06 ENCOUNTER — Ambulatory Visit: Payer: Self-pay

## 2019-03-06 DIAGNOSIS — Z20822 Contact with and (suspected) exposure to covid-19: Secondary | ICD-10-CM

## 2019-03-06 NOTE — Addendum Note (Signed)
Addended by: Chana Bode on: 03/06/2019 03:11 PM   Modules accepted: Orders

## 2019-03-07 LAB — NOVEL CORONAVIRUS, NAA: SARS-CoV-2, NAA: NOT DETECTED

## 2019-05-18 ENCOUNTER — Ambulatory Visit: Payer: Medicare Other | Attending: Internal Medicine

## 2019-05-18 DIAGNOSIS — Z23 Encounter for immunization: Secondary | ICD-10-CM

## 2019-05-18 NOTE — Progress Notes (Signed)
   Covid-19 Vaccination Clinic  Name:  DANICKA HOURIHAN    MRN: [id number] DOB: 1965/12/21  05/18/2019  Ms. Bienaime was observed post Covid-19 immunization for 15 minutes without incident. She was provided with Vaccine Information Sheet and instruction to access the V-Safe system.   Ms. Stanislawski was instructed to call 911 with any severe reactions post vaccine: Marland Kitchen Difficulty breathing  . Swelling of face and throat  . A fast heartbeat  . A bad rash all over body  . Dizziness and weakness   Immunizations Administered    Name Date Dose VIS Date Route   Pfizer COVID-19 Vaccine 05/18/2019  3:22 PM 0.3 mL 03/15/2018 Intramuscular   Manufacturer: [employer], [employer]   Lot: [id number]   NDC: [address]-3

## 2019-06-12 ENCOUNTER — Ambulatory Visit: Payer: Medicare Other | Attending: Internal Medicine

## 2019-06-12 DIAGNOSIS — Z23 Encounter for immunization: Secondary | ICD-10-CM

## 2019-06-12 NOTE — Progress Notes (Addendum)
   Covid-19 Vaccination Clinic  Name:  [name]    MRN: [id number] DOB: 03/18/1965  06/12/2019  [name] was observed post Covid-19 immunization for 30 minutes based on pre-vaccination screening without incident. She was provided with Vaccine Information Sheet and instruction to access the V-Safe system. Pt experienced nausea related to anxiety.  [name] was instructed to call 911 with any severe reactions post vaccine: Marland Kitchen Difficulty breathing  . Swelling of face and throat  . A fast heartbeat  . A bad rash all over body  . Dizziness and weakness   Immunizations Administered    Name Date Dose VIS Date Route   Pfizer COVID-19 Vaccine 06/12/2019  3:45 PM 0.3 mL 03/15/2018 Intramuscular   Manufacturer: [employer], [employer]   Lot: [id number]   NDC: [address]-7

## 2019-06-20 HISTORY — PX: BREAST SURGERY: SHX581

## 2019-07-29 LAB — EXTERNAL GENERIC LAB PROCEDURE

## 2019-07-29 LAB — COLOGUARD

## 2019-08-07 LAB — EXTERNAL GENERIC LAB PROCEDURE

## 2019-08-07 LAB — COLOGUARD

## 2019-09-30 IMAGING — CR DG HIP (WITH OR WITHOUT PELVIS) 2-3V*R*
3 series · 3 of 3 positions shown · non-contrast
Comparison: None.

CLINICAL DATA: Left hip pain

EXAM:
DG HIP (WITH OR WITHOUT PELVIS) 2-3V RIGHT

[pelvis ap]
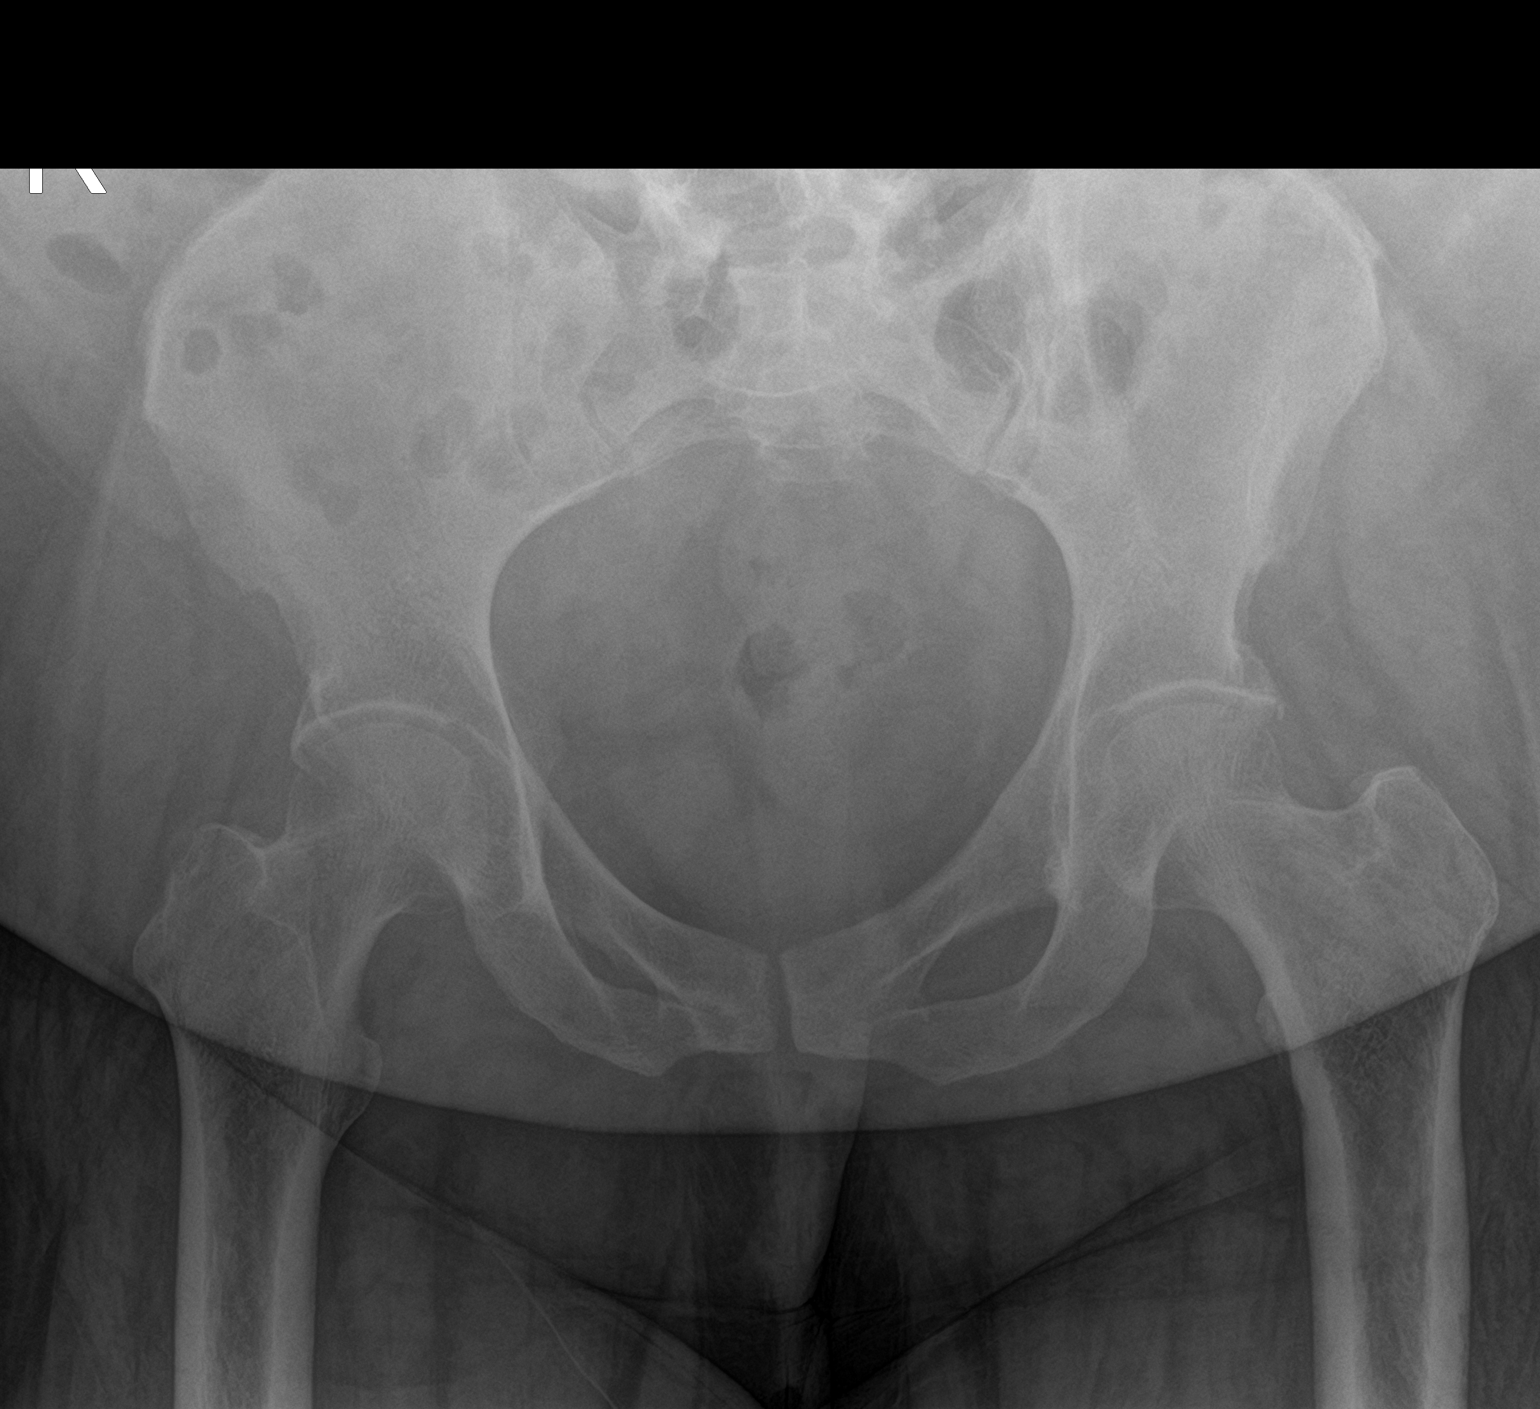

[hip ap]
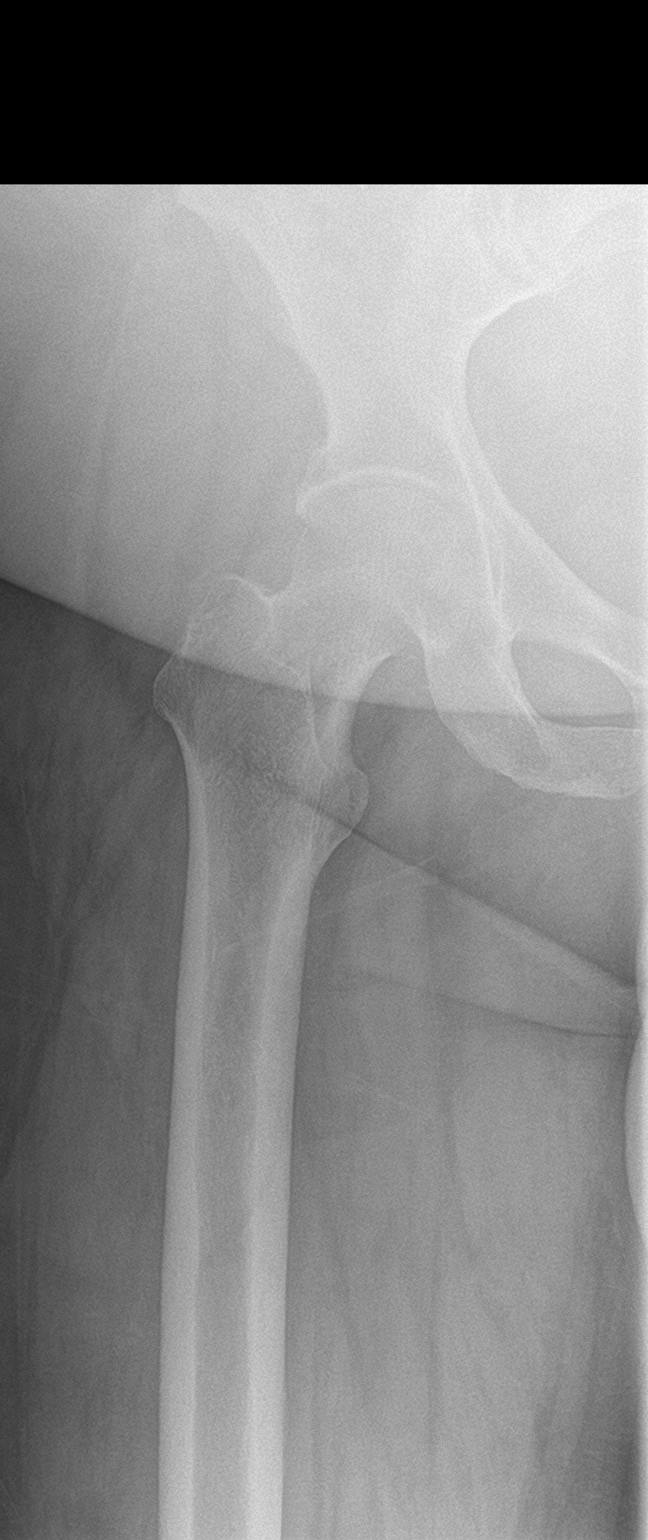

[hip lat]
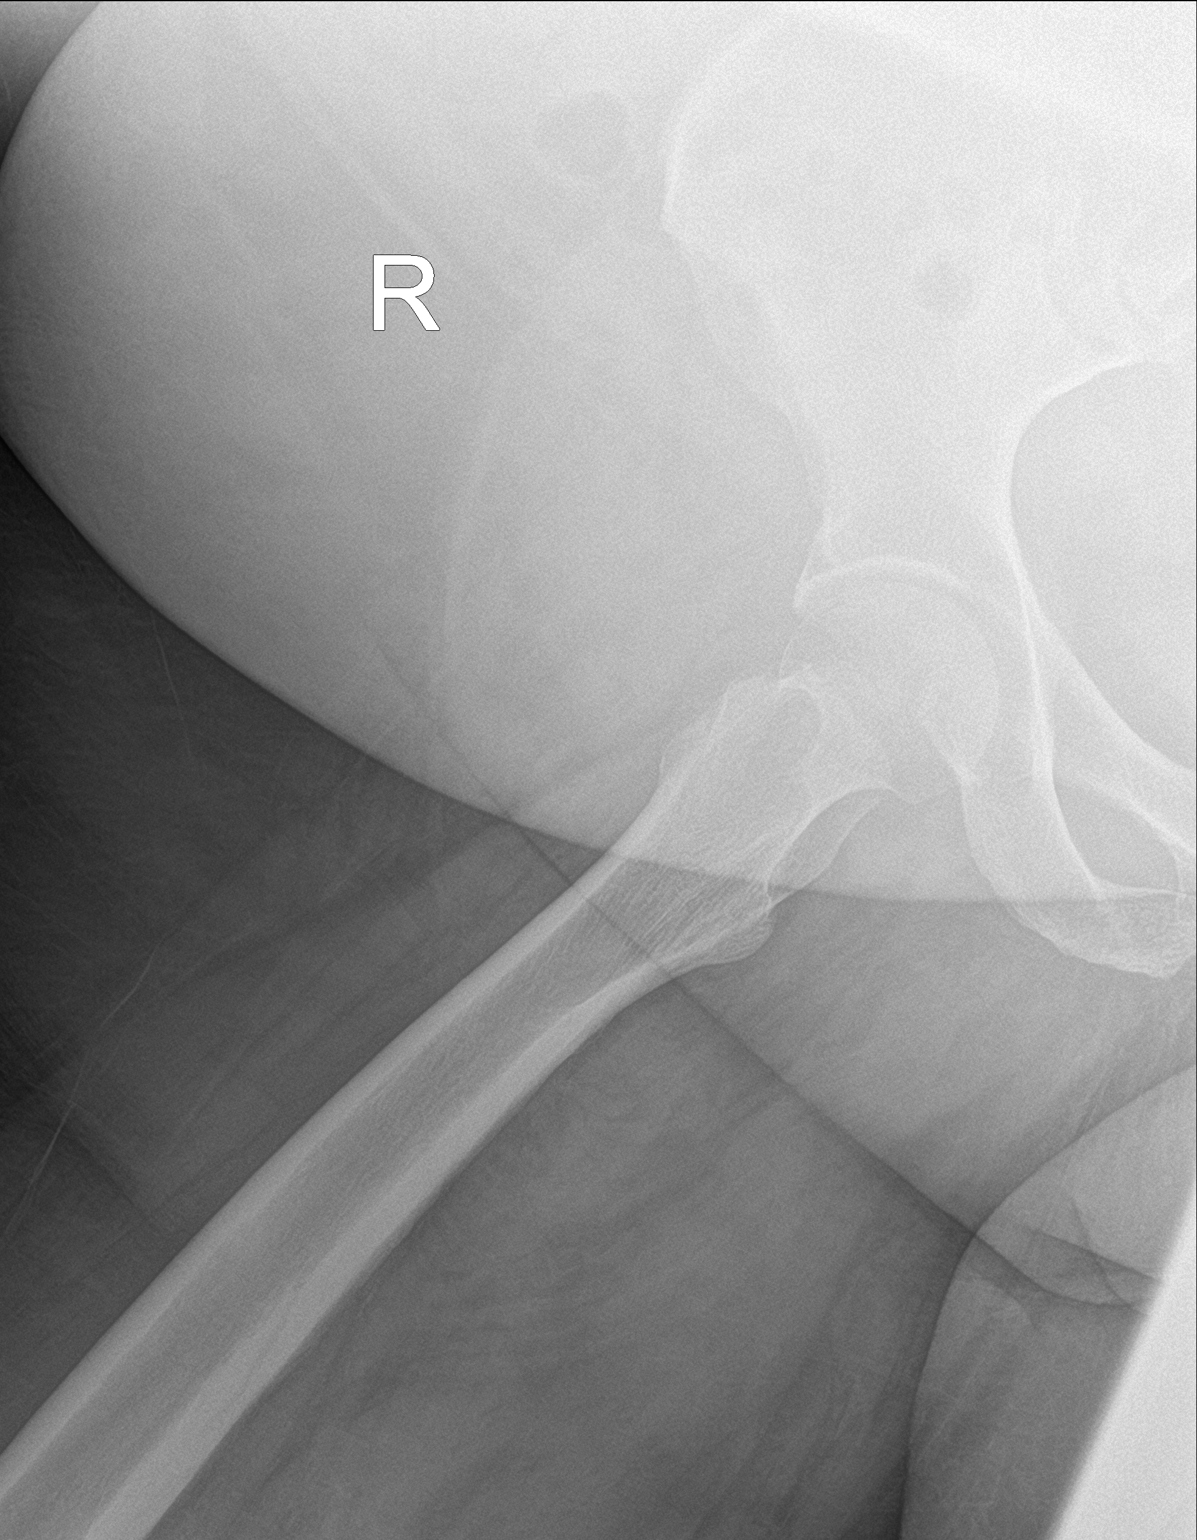

[3 of 3 positions shown; findings below may reference images not displayed]

FINDINGS: There is no evidence of hip fracture or dislocation. There is no
evidence of arthropathy or other focal bone abnormality.
IMPRESSION: No acute osseous injury of the right hip.

## 2019-12-18 ENCOUNTER — Emergency Department (HOSPITAL_COMMUNITY): Payer: Medicare Other

## 2019-12-18 ENCOUNTER — Emergency Department (HOSPITAL_COMMUNITY)
Admission: EM | Admit: 2019-12-18 | Discharge: 2019-12-18 | Disposition: A | Discharge status: Home / Self Care | Payer: Medicare Other | Attending: Emergency Medicine | Admitting: Emergency Medicine

## 2019-12-18 ENCOUNTER — Other Ambulatory Visit: Payer: Self-pay

## 2019-12-18 ENCOUNTER — Encounter (HOSPITAL_COMMUNITY): Payer: Self-pay

## 2019-12-18 DIAGNOSIS — Z79899 Other long term (current) drug therapy: Secondary | ICD-10-CM | POA: Diagnosis not present

## 2019-12-18 DIAGNOSIS — H538 Other visual disturbances: Secondary | ICD-10-CM | POA: Diagnosis present

## 2019-12-18 DIAGNOSIS — E039 Hypothyroidism, unspecified: Secondary | ICD-10-CM | POA: Insufficient documentation

## 2019-12-18 DIAGNOSIS — Z87891 Personal history of nicotine dependence: Secondary | ICD-10-CM | POA: Diagnosis not present

## 2019-12-18 DIAGNOSIS — Z7901 Long term (current) use of anticoagulants: Secondary | ICD-10-CM | POA: Insufficient documentation

## 2019-12-18 DIAGNOSIS — Z7984 Long term (current) use of oral hypoglycemic drugs: Secondary | ICD-10-CM | POA: Insufficient documentation

## 2019-12-18 DIAGNOSIS — J45909 Unspecified asthma, uncomplicated: Secondary | ICD-10-CM | POA: Insufficient documentation

## 2019-12-18 DIAGNOSIS — R519 Headache, unspecified: Secondary | ICD-10-CM | POA: Diagnosis not present

## 2019-12-18 DIAGNOSIS — H539 Unspecified visual disturbance: Secondary | ICD-10-CM

## 2019-12-18 LAB — CBC
HCT: 35.7 % — ABNORMAL LOW (ref 36.0–46.0)
Hemoglobin: 10.3 g/dL — ABNORMAL LOW (ref 12.0–15.0)
MCH: 23.8 pg — ABNORMAL LOW (ref 26.0–34.0)
MCHC: 28.9 g/dL — ABNORMAL LOW (ref 30.0–36.0)
MCV: 82.4 fL (ref 80.0–100.0)
Platelets: 195 10*3/uL (ref 150–400)
RBC: 4.33 MIL/uL (ref 3.87–5.11)
RDW: 17.1 % — ABNORMAL HIGH (ref 11.5–15.5)
WBC: 8.5 10*3/uL (ref 4.0–10.5)
nRBC: 0 % (ref 0.0–0.2)

## 2019-12-18 LAB — BASIC METABOLIC PANEL
Anion gap: 10 (ref 5–15)
BUN: <5 mg/dL — ABNORMAL LOW (ref 6–20)
CO2: 28 mmol/L (ref 22–32)
Calcium: 9.4 mg/dL (ref 8.9–10.3)
Chloride: 104 mmol/L (ref 98–111)
Creatinine, Ser: 0.68 mg/dL (ref 0.44–1.00)
GFR, Estimated: >60 mL/min (ref >60)
Glucose, Bld: 102 mg/dL — ABNORMAL HIGH (ref 70–99)
Potassium: 4 mmol/L (ref 3.5–5.1)
Sodium: 142 mmol/L (ref 135–145)

## 2019-12-18 MED ORDER — TETRACAINE HCL 0.5 % OP SOLN
2.0000 [drp] | Freq: Once | OPHTHALMIC | Status: AC
  Administered 2019-12-18: 2 [drp] via OPHTHALMIC
  Filled 2019-12-18: qty 4

## 2019-12-18 NOTE — ED Provider Notes (Signed)
[facility] EMERGENCY DEPARTMENT Provider Note   CSN: [id number] Arrival date & time: 12/18/19  0753     History Chief Complaint  Patient presents with  . Headache  . Spots and/or Floaters    [name] is a 54 y.o. female.  HPI      Friday developed floaters, like jelly fish, vision so blurry can barely see Headache developed on left side If looks the the left or right it follows vision, all over vision, middle, Friday looked like an oil spill, big blotches all over, now is like like little mushroom/tentacles going everywhere and having difficulty seeing beyond it Painless other than headache. Headache 5/10 now.  Denies numbness, weakness, difficulty talking or walking or facial droop.   No n/v/fevers/falls No hx of migraines Nothing makes headache better or worse  Has poor vision in right eye, Dr. Nedra Hai at [facility]   Past Medical History:  Diagnosis Date  . Asthma   . Depression   . Heart murmur   . Myasthenia gravis (HCC)   . Obesity   . Pulmonary embolism (HCC)   . Sleep apnea   . Thyroid disease     Patient Active Problem List   Diagnosis Date Noted  . History of pulmonary embolus (PE) 11/21/2018  . Pulmonary emboli (HCC) 08/19/2016  . Asthma exacerbation 08/16/2016  . Depression 06/20/2014  . Anxiety 06/20/2014  . Sleep apnea 06/20/2014  . GERD (gastroesophageal reflux disease) 12/28/2012  . Hypothyroidism 12/31/1999  . Morbid obesity (HCC) 03/02/1994  . Myasthenia gravis (HCC) 09/10/1992    Past Surgical History:  Procedure Laterality Date  . BREAST SURGERY  06/2019   breast reduction  . THYROID SURGERY    . TONSILLECTOMY    . TUBAL LIGATION       OB History   No obstetric history on file.     No family history on file.  Social History   Tobacco Use  . Smoking status: Former Games developer  . Smokeless tobacco: Never Used  Substance Use Topics  . Alcohol use: Yes  . Drug use: Never    Home Medications Prior to  Admission medications   Medication Sig Start Date End Date Taking? Authorizing Provider  acetaminophen (TYLENOL) 500 MG tablet Take 500 mg by mouth every 6 (six) hours as needed for mild pain.    [provider]  albuterol (VENTOLIN HFA) 108 (90 Base) MCG/ACT inhaler Inhale 2 puffs into the lungs every 4 (four) hours as needed for wheezing or shortness of breath.  05/28/16   [provider]  budesonide-formoterol (SYMBICORT) 160-4.5 MCG/ACT inhaler Inhale 2 puffs into the lungs 3 (three) times daily. 04/26/18   [provider]  famotidine (PEPCID) 20 MG tablet Take 1 tablet (20 mg total) by mouth 2 (two) times daily. 03/09/18   Henderly, Britni A, PA-C  fluticasone (FLONASE) 50 MCG/ACT nasal spray Place 1 spray into both nostrils daily.  05/30/15   [provider]  furosemide (LASIX) 20 MG tablet Take 1 tablet (20 mg total) by mouth daily. 08/19/16 12/21/18  Joseph Art, DO  ipratropium-albuterol (DUONEB) 0.5-2.5 (3) MG/3ML SOLN Take 3 mLs by nebulization 3 (three) times daily as needed (wheezing). 08/19/16   Joseph Art, DO  levothyroxine (SYNTHROID, LEVOTHROID) 112 MCG tablet Take 224 mcg by mouth daily. 07/02/16   [provider]  lisinopril (PRINIVIL,ZESTRIL) 10 MG tablet Take 1 tablet (10 mg total) by mouth daily. 08/20/16   Joseph Art, DO  montelukast (  SINGULAIR) 10 MG tablet Take 10 mg by mouth at bedtime.    [provider]  mycophenolate (CELLCEPT) 500 MG tablet Take 1,000-1,500 mg by mouth See admin instructions. Take 2 tablets in the morning and 3 tablets at bedtime 06/01/16   [provider]  omeprazole (PRILOSEC) 20 MG capsule Take 20 mg by mouth 2 (two) times daily. 05/28/16   [provider]  ondansetron (ZOFRAN ODT) 4 MG disintegrating tablet Take 1 tablet (4 mg total) by mouth every 8 (eight) hours as needed for nausea or vomiting. 09/09/18   Garry Heater, MD  PARoxetine (PAXIL) 30 MG tablet Take 60 mg by mouth  daily.     [provider]  potassium chloride (K-DUR) 10 MEQ tablet Take 1 tablet (10 mEq total) by mouth daily. 08/19/16   Joseph Art, DO  pyridostigmine (MESTINON) 60 MG tablet Take 30 mg by mouth 5 (five) times daily.  06/09/16   [provider]  Rivaroxaban 15 & 20 MG TBPK Take as directed on package: Start with one 15mg  tablet by mouth twice a day with food. On Day 22, switch to one 20mg  tablet once a day with food. Patient not taking: Reported on 06/07/2018 08/19/16   06/09/2018, DO  SPIRIVA RESPIMAT 1.25 MCG/ACT AERS Inhale 1-2 puffs into the lungs daily as needed for shortness of breath. 09/15/18   [provider]  traMADol (ULTRAM) 50 MG tablet Take 1 tablet (50 mg total) by mouth every 6 (six) hours as needed. Patient not taking: Reported on 06/07/2018 12/26/17   06/09/2018, MD    Allergies    Patient has no known allergies.  Review of Systems   Review of Systems  Constitutional: Negative for fever.  Eyes: Positive for visual disturbance.  Respiratory: Negative for shortness of breath.   Cardiovascular: Negative for chest pain.  Gastrointestinal: Negative for nausea and vomiting.  Genitourinary: Negative for difficulty urinating.  Neurological: Positive for headaches. Negative for dizziness, syncope, facial asymmetry, speech difficulty, weakness, light-headedness and numbness.    Physical Exam Updated Vital Signs BP (!) 165/75   Pulse 74   Temp 98.4 F (36.9 C) (Oral)   Resp (!) 25   Ht 5' (1.524 m)   Wt 127 kg   SpO2 92%   BMI 54.68 kg/m   Physical Exam Vitals and nursing note reviewed.  Constitutional:      General: She is not in acute distress.    Appearance: She is well-developed. She is not diaphoretic.  HENT:     Head: Normocephalic and atraumatic.  Eyes:     Conjunctiva/sclera: Conjunctivae normal.     Pupils: Pupils are equal, round, and reactive to light.     Comments: IOP 22 OD, 13 OS Dysconjugate gaze (baseline left eye  deviation), EOM normal, normal visual fields Can count fingers, cannot read  Cardiovascular:     Rate and Rhythm: Normal rate and regular rhythm.     Heart sounds: Normal heart sounds. No murmur heard.  No friction rub. No gallop.   Pulmonary:     Effort: Pulmonary effort is normal. No respiratory distress.     Breath sounds: Normal breath sounds. No wheezing or rales.  Abdominal:     General: There is no distension.     Palpations: Abdomen is soft.     Tenderness: There is no abdominal tenderness. There is no guarding.  Musculoskeletal:        General: No tenderness.     Cervical  back: Normal range of motion.  Skin:    General: Skin is warm and dry.     Findings: No erythema or rash.  Neurological:     Mental Status: She is alert and oriented to person, place, and time.     ED Results / Procedures / Treatments   Labs (all labs ordered are listed, but only abnormal results are displayed) Labs Reviewed  BASIC METABOLIC PANEL - Abnormal; Notable for the following components:      Result Value   Glucose, Bld 102 (*)    BUN <5 (*)    All other components within normal limits  CBC - Abnormal; Notable for the following components:   Hemoglobin 10.3 (*)    HCT 35.7 (*)    MCH 23.8 (*)    MCHC 28.9 (*)    RDW 17.1 (*)    All other components within normal limits    EKG EKG Interpretation  Date/Time:  Monday December 18 2019 09:47:56 EST Ventricular Rate:  70 PR Interval:    QRS Duration: 84 QT Interval:  425 QTC Calculation: 459 R Axis:   55 Text Interpretation: Sinus rhythm Probable anteroseptal infarct, old No significant change since last tracing Confirmed by Alvira Monday ([id number]) on 12/18/2019 10:24:20 AM   Radiology CT Head Wo Contrast  Result Date: 12/18/2019 CLINICAL DATA:  54 year old with headache. EXAM: CT HEAD WITHOUT CONTRAST TECHNIQUE: Contiguous axial images were obtained from the base of the skull through the vertex without intravenous contrast.  COMPARISON:  None. FINDINGS: Brain: No evidence of acute infarction, hemorrhage, hydrocephalus, extra-axial collection or mass lesion/mass effect. Vascular: No hyperdense vessel or unexpected calcification. Skull: Normal. Negative for fracture or focal lesion. Sinuses/Orbits: Mild mucosal disease in the ethmoid air cells, right maxillary sinus and frontal sinuses. Other: None. IMPRESSION: 1. No acute intracranial abnormality. 2. Mild paranasal sinus disease. Electronically Signed   By: Richarda Overlie M.D.   On: 12/18/2019 10:20    Procedures Procedures (including critical care time)  Medications Ordered in ED Medications  tetracaine (PONTOCAINE) 0.5 % ophthalmic solution 2 drop (2 drops Both Eyes Given by Other 12/18/19 1030)    ED Course  I have reviewed the triage vital signs and the nursing notes.  Pertinent labs & imaging results that were available during my care of the patient were reviewed by me and considered in my medical decision making (see chart for details).    MDM Rules/Calculators/A&P                          54yo female with history of PE, myasthenia gravis, OSA, thyroid disease, asthma, presents with concern for 3 days of floaters in left eye.  Also reports headache, given new headache, age and visual change CT head done which shows no acute abnormalities.  No significant lab abnormalities.  Visual changes described not consistent with CRAO/CRVO or CVA.  No sign of glaucoma or abrasion.  Discussed with Dr. Genia Del of Ophthalmology who will assess her emergently in his office.     Final Clinical Impression(s) / ED Diagnoses Final diagnoses:  Vision changes    Rx / DC Orders ED Discharge Orders    None       Alvira Monday, MD 12/19/19 0004

## 2019-12-18 NOTE — ED Triage Notes (Signed)
Pt reports left sided headache and left eye floaters/spots since Friday. Denies any dizziness or feeling lightheaded. Pt a.o, nad noted.

## 2020-01-23 ENCOUNTER — Other Ambulatory Visit: Payer: Medicare HMO

## 2020-01-23 DIAGNOSIS — Z20822 Contact with and (suspected) exposure to covid-19: Secondary | ICD-10-CM

## 2020-01-25 ENCOUNTER — Telehealth: Payer: Self-pay | Admitting: *Deleted

## 2020-01-25 NOTE — Telephone Encounter (Signed)
Patient called for covid results ,advised results are still pending.

## 2020-01-26 ENCOUNTER — Ambulatory Visit: Payer: Self-pay | Admitting: *Deleted

## 2020-01-26 LAB — NOVEL CORONAVIRUS, NAA

## 2020-01-26 NOTE — Telephone Encounter (Signed)
Patient calling for covid test results. Comment noted for results and reviewed with patient results indeterminate, unable to reliably determine a result for the specimen submitted. Patient verbalized understanding  And reschedule covid test for 01/28/19 at [facility].

## 2020-01-29 ENCOUNTER — Other Ambulatory Visit: Payer: Medicare HMO

## 2020-01-29 ENCOUNTER — Other Ambulatory Visit: Payer: Self-pay

## 2020-01-29 DIAGNOSIS — Z20822 Contact with and (suspected) exposure to covid-19: Secondary | ICD-10-CM

## 2020-01-29 NOTE — Addendum Note (Signed)
Addended by: Lurlean Leyden on: 01/29/2020 07:45 PM   Modules accepted: Orders

## 2020-02-01 LAB — NOVEL CORONAVIRUS, NAA: SARS-CoV-2, NAA: NOT DETECTED

## 2020-02-01 LAB — SARS-COV-2, NAA 2 DAY TAT

## 2021-09-21 IMAGING — CT CT HEAD W/O CM
3 of 4 series · 15 of 47 positions shown, 18 images · non-contrast
Comparison: None.

CLINICAL DATA: 54-year-old with headache.

EXAM:
CT HEAD WITHOUT CONTRAST
TECHNIQUE: Contiguous axial images were obtained from the base of the skull
through the vertex without intravenous contrast.

[Series 4: head 2.0 h70h · axial · 0.45mm/px · z∈[-147,-21]mm · 9 of 79 slices shown, 12 images]
[im 8/79  brain]
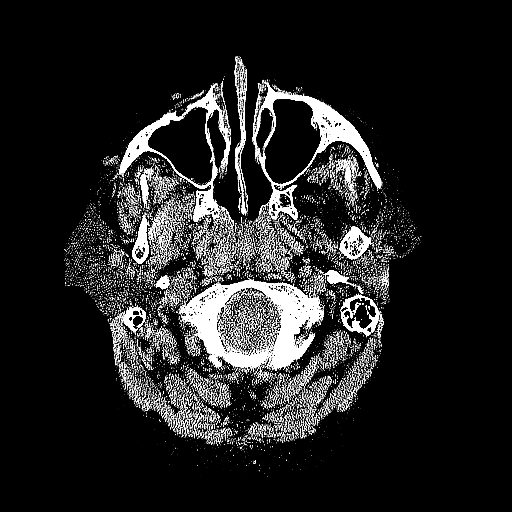
[im 8/79  bone]
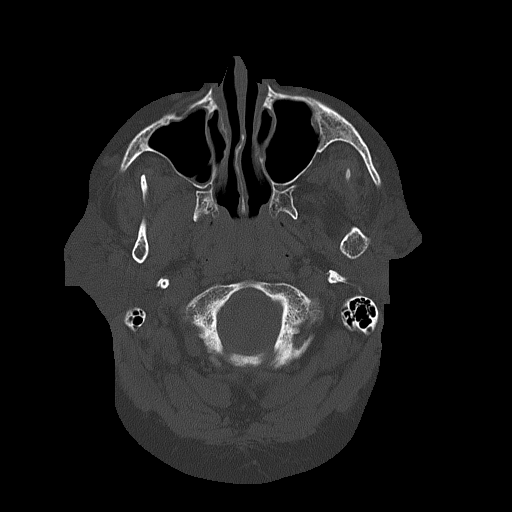
[im 16/79  brain]
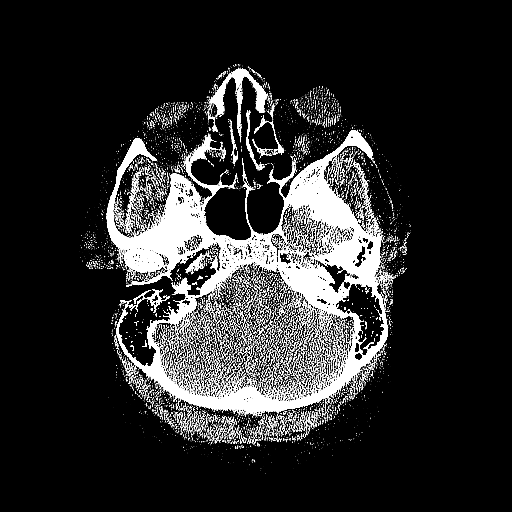
[im 24/79  brain]
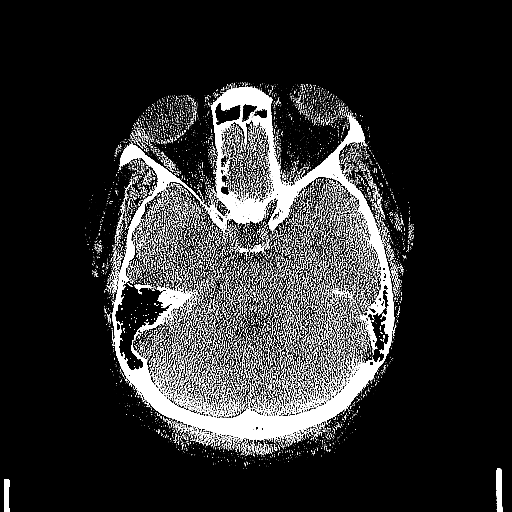
[im 32/79  brain]
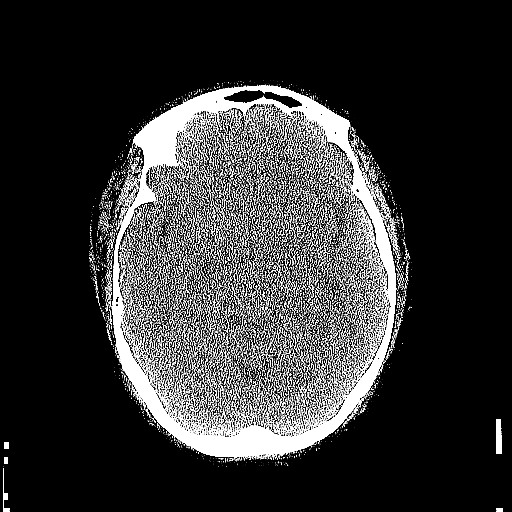
[im 40/79  brain]
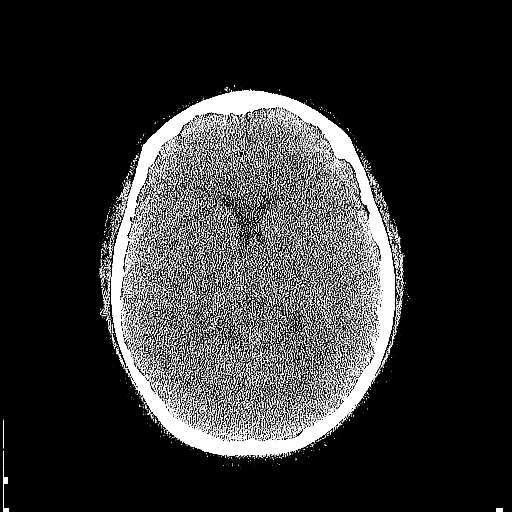
[im 40/79  bone]
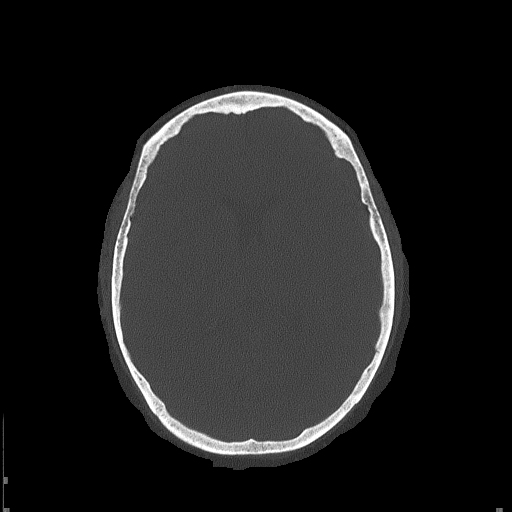
[im 47/79  brain]
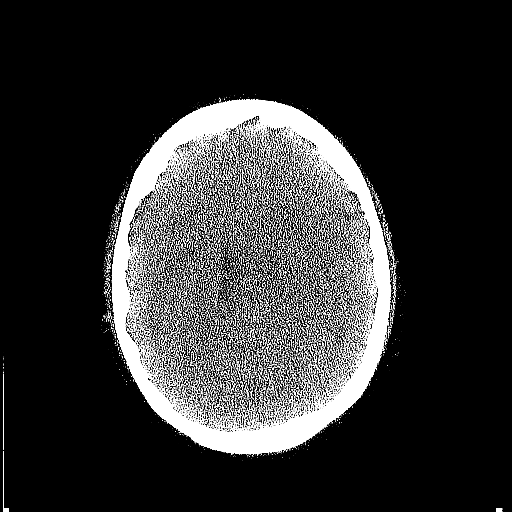
[im 55/79  brain]
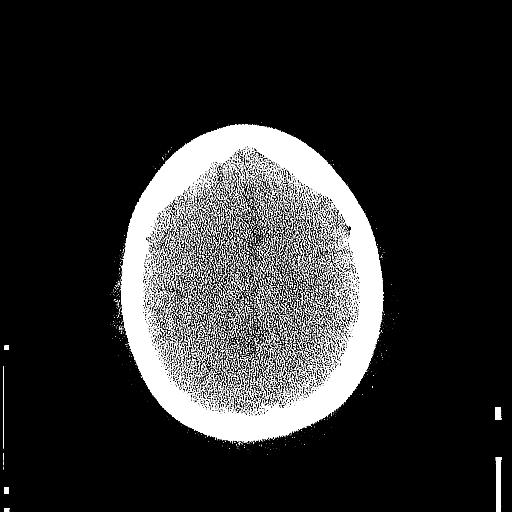
[im 63/79  brain]
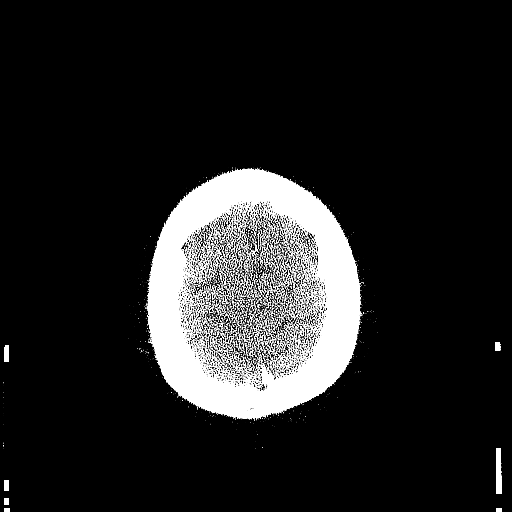
[im 71/79  brain]
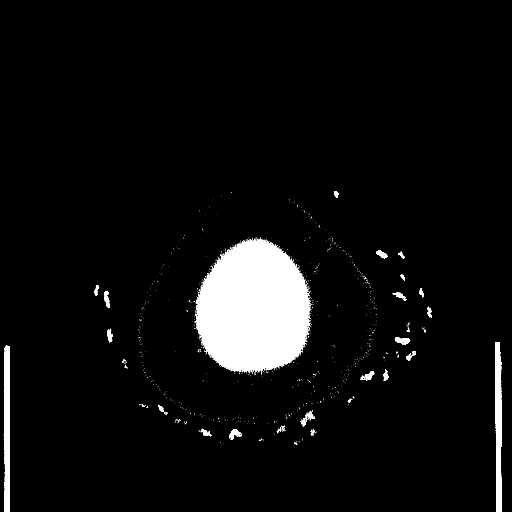
[im 71/79  bone]
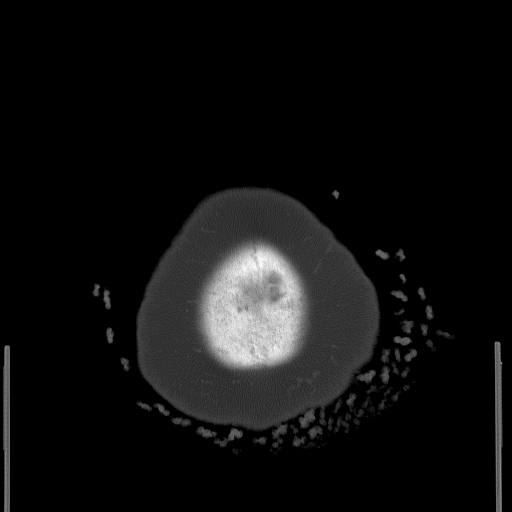

[Series 5: head 3.0 mpr cor · coronal · 0.31mm/px · 3 of 67 slices shown]
[im 23/67  brain]
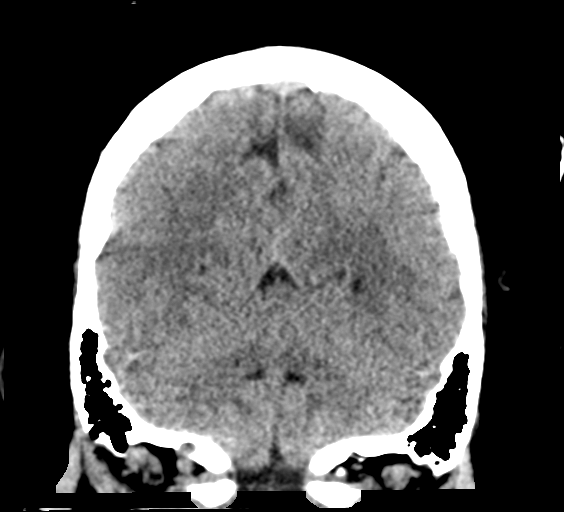
[im 30/67  brain]
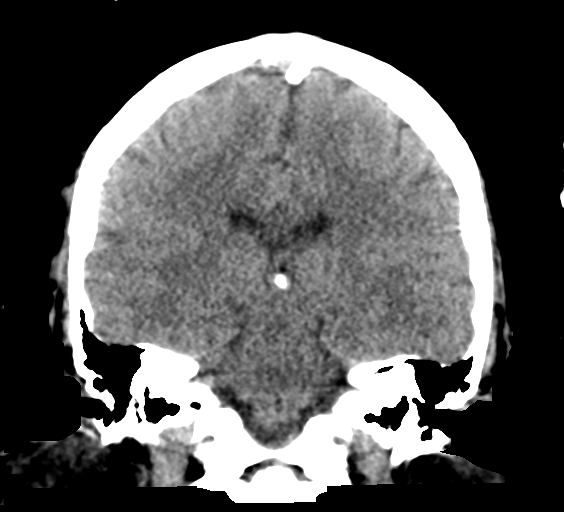
[im 37/67  brain]
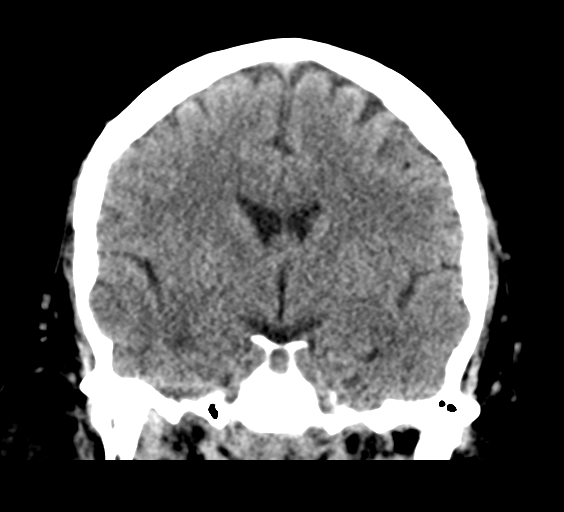

[Series 6: head 3.0 mpr sag · sagittal · 0.31mm/px · 3 of 55 slices shown]
[im 19/55  brain]
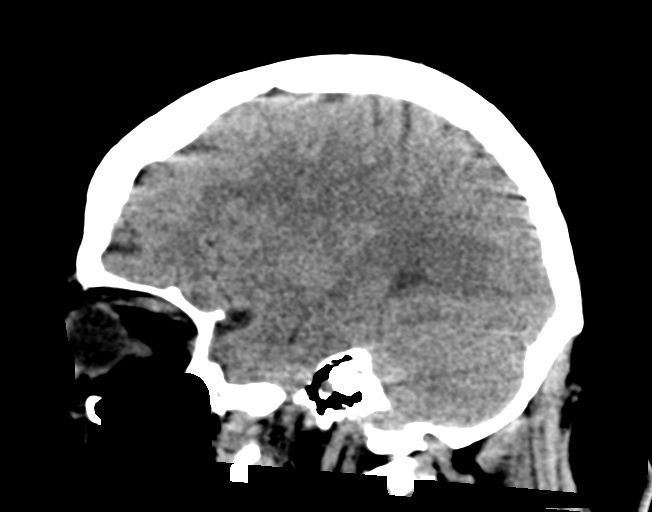
[im 28/55  brain]
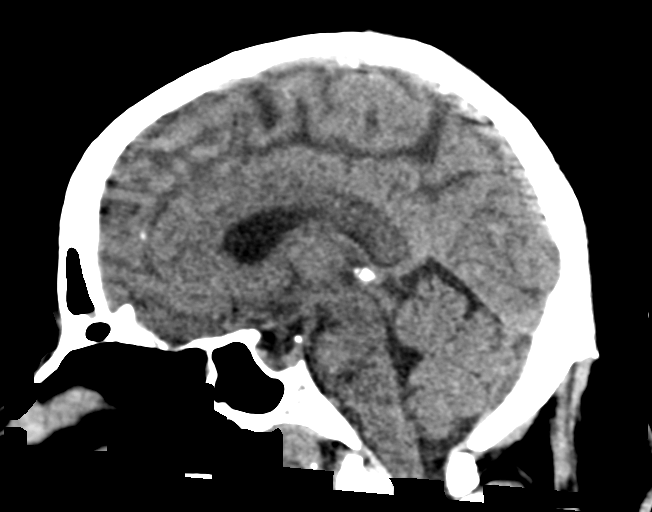
[im 37/55  brain]
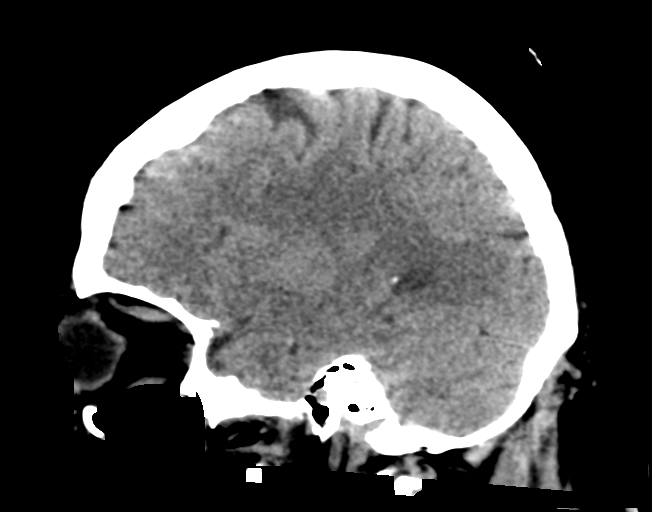

[15 of 47 positions shown; findings below may reference images not displayed]

FINDINGS: Brain: No evidence of acute infarction, hemorrhage, hydrocephalus,
extra-axial collection or mass lesion/mass effect.

Vascular: No hyperdense vessel or unexpected calcification.

Skull: Normal. Negative for fracture or focal lesion.

Sinuses/Orbits: Mild mucosal disease in the ethmoid air cells, right
maxillary sinus and frontal sinuses.

Other: None.
IMPRESSION: 1. No acute intracranial abnormality.
2. Mild paranasal sinus disease.

## 2022-10-20 ENCOUNTER — Encounter (HOSPITAL_COMMUNITY): Payer: Self-pay

## 2022-10-20 ENCOUNTER — Other Ambulatory Visit: Payer: Self-pay

## 2022-10-20 ENCOUNTER — Emergency Department (HOSPITAL_COMMUNITY)
Admission: EM | Admit: 2022-10-20 | Discharge: 2022-10-20 | Disposition: A | Discharge status: Home / Self Care | Payer: Medicare HMO | Attending: Emergency Medicine | Admitting: Emergency Medicine

## 2022-10-20 DIAGNOSIS — J45909 Unspecified asthma, uncomplicated: Secondary | ICD-10-CM | POA: Insufficient documentation

## 2022-10-20 DIAGNOSIS — R200 Anesthesia of skin: Secondary | ICD-10-CM | POA: Diagnosis present

## 2022-10-20 DIAGNOSIS — G629 Polyneuropathy, unspecified: Secondary | ICD-10-CM | POA: Insufficient documentation

## 2022-10-20 DIAGNOSIS — Z7951 Long term (current) use of inhaled steroids: Secondary | ICD-10-CM | POA: Diagnosis not present

## 2022-10-20 LAB — CBC
HCT: 39.7 % (ref 36.0–46.0)
Hemoglobin: 12.9 g/dL (ref 12.0–15.0)
MCH: 34.1 pg — ABNORMAL HIGH (ref 26.0–34.0)
MCHC: 32.5 g/dL (ref 30.0–36.0)
MCV: 105 fL — ABNORMAL HIGH (ref 80.0–100.0)
Platelets: 212 10*3/uL (ref 150–400)
RBC: 3.78 MIL/uL — ABNORMAL LOW (ref 3.87–5.11)
RDW: 13.8 % (ref 11.5–15.5)
WBC: 6.3 10*3/uL (ref 4.0–10.5)
nRBC: 0 % (ref 0.0–0.2)

## 2022-10-20 LAB — COMPREHENSIVE METABOLIC PANEL
ALT: 13 U/L (ref 0–44)
AST: 20 U/L (ref 15–41)
Albumin: 3.4 g/dL — ABNORMAL LOW (ref 3.5–5.0)
Alkaline Phosphatase: 55 U/L (ref 38–126)
Anion gap: 15 (ref 5–15)
BUN: 6 mg/dL (ref 6–20)
CO2: 23 mmol/L (ref 22–32)
Calcium: 9 mg/dL (ref 8.9–10.3)
Chloride: 100 mmol/L (ref 98–111)
Creatinine, Ser: 0.8 mg/dL (ref 0.44–1.00)
GFR, Estimated: >60 mL/min (ref >60)
Glucose, Bld: 103 mg/dL — ABNORMAL HIGH (ref 70–99)
Potassium: 3.9 mmol/L (ref 3.5–5.1)
Sodium: 138 mmol/L (ref 135–145)
Total Bilirubin: 0.6 mg/dL (ref 0.3–1.2)
Total Protein: 7.1 g/dL (ref 6.5–8.1)

## 2022-10-20 LAB — TSH: TSH: 4.698 u[IU]/mL — ABNORMAL HIGH (ref 0.350–4.500)

## 2022-10-20 MED ORDER — OXYCODONE-ACETAMINOPHEN 5-325 MG PO TABS
1.0000 | ORAL_TABLET | Freq: Three times a day (TID) | ORAL | 0 refills | Status: DC | PRN
Start: 2022-10-20 — End: 2023-08-18

## 2022-10-20 NOTE — ED Provider Notes (Signed)
[place] EMERGENCY DEPARTMENT AT [facility] Provider Note   CSN: [id number] Arrival date & time: 10/20/22  4098     History  Chief Complaint  Patient presents with   Numbness bil hands/feet    [name] is a 57 y.o. female.  HPI Patient presents with some numbness and pain in bilateral hands and feet.  Is had now since around January with that being now October.  Not diabetic.  No numbness weakness.  States it does hurt to walk.  History of myasthenia gravis and thyroid disease.  No headache.  No confusion.  No previous known diagnosis of neuropathy.   Past Medical History:  Diagnosis Date   Asthma    Depression    Heart murmur    Myasthenia gravis (HCC)    Obesity    Pulmonary embolism (HCC)    Sleep apnea    Thyroid disease     Home Medications Prior to Admission medications   Medication Sig Start Date End Date Taking? Authorizing Provider  oxyCODONE-acetaminophen (PERCOCET/ROXICET) 5-325 MG tablet Take 1 tablet by mouth every 8 (eight) hours as needed for severe pain. 10/20/22  Yes Benjiman Core, MD  acetaminophen (TYLENOL) 500 MG tablet Take 500 mg by mouth every 6 (six) hours as needed for mild pain.    [provider]  albuterol (VENTOLIN HFA) 108 (90 Base) MCG/ACT inhaler Inhale 2 puffs into the lungs every 4 (four) hours as needed for wheezing or shortness of breath.  05/28/16   [provider]  budesonide-formoterol (SYMBICORT) 160-4.5 MCG/ACT inhaler Inhale 2 puffs into the lungs 3 (three) times daily. 04/26/18   [provider]  famotidine (PEPCID) 20 MG tablet Take 1 tablet (20 mg total) by mouth 2 (two) times daily. 03/09/18   Henderly, Britni A, PA-C  fluticasone (FLONASE) 50 MCG/ACT nasal spray Place 1 spray into both nostrils daily.  05/30/15   [provider]  furosemide (LASIX) 20 MG tablet Take 1 tablet (20 mg total) by mouth daily. 08/19/16 12/21/18  Joseph Art, DO  ipratropium-albuterol (DUONEB)  0.5-2.5 (3) MG/3ML SOLN Take 3 mLs by nebulization 3 (three) times daily as needed (wheezing). 08/19/16   Joseph Art, DO  levothyroxine (SYNTHROID, LEVOTHROID) 112 MCG tablet Take 224 mcg by mouth daily. 07/02/16   [provider]  lisinopril (PRINIVIL,ZESTRIL) 10 MG tablet Take 1 tablet (10 mg total) by mouth daily. 08/20/16   Joseph Art, DO  montelukast (SINGULAIR) 10 MG tablet Take 10 mg by mouth at bedtime.    [provider]  mycophenolate (CELLCEPT) 500 MG tablet Take 1,000-1,500 mg by mouth See admin instructions. Take 2 tablets in the morning and 3 tablets at bedtime 06/01/16   [provider]  omeprazole (PRILOSEC) 20 MG capsule Take 20 mg by mouth 2 (two) times daily. 05/28/16   [provider]  ondansetron (ZOFRAN ODT) 4 MG disintegrating tablet Take 1 tablet (4 mg total) by mouth every 8 (eight) hours as needed for nausea or vomiting. 09/09/18   Garry Heater, MD  PARoxetine (PAXIL) 30 MG tablet Take 60 mg by mouth daily.     [provider]  potassium chloride (K-DUR) 10 MEQ tablet Take 1 tablet (10 mEq total) by mouth daily. 08/19/16   Joseph Art, DO  pyridostigmine (MESTINON) 60 MG tablet Take 30 mg by mouth 5 (five) times daily.  06/09/16   [provider]  Rivaroxaban 15 & 20 MG TBPK Take as directed on package:  Start with one 15mg  tablet by mouth twice a day with food. On Day 22, switch to one 20mg  tablet once a day with food. Patient not taking: Reported on 06/07/2018 08/19/16   Joseph Art, DO  SPIRIVA RESPIMAT 1.25 MCG/ACT AERS Inhale 1-2 puffs into the lungs daily as needed for shortness of breath. 09/15/18   [provider]  traMADol (ULTRAM) 50 MG tablet Take 1 tablet (50 mg total) by mouth every 6 (six) hours as needed. Patient not taking: Reported on 06/07/2018 12/26/17   Linwood Dibbles, MD      Allergies    Patient has no known allergies.    Review of Systems   Review of Systems  Physical Exam Updated  Vital Signs BP (!) 131/59 (BP Location: Right Arm)   Pulse 94   Temp 98.5 F (36.9 C) (Oral)   Resp 18   Ht 5\' 2"  (1.575 m)   Wt (!) 163.3 kg   SpO2 93%   BMI 65.84 kg/m  Physical Exam Vitals and nursing note reviewed.  Cardiovascular:     Rate and Rhythm: Regular rhythm.  Musculoskeletal:     Cervical back: Neck supple.  Neurological:     Mental Status: She is alert.     Comments: Sensation intact in both hands and feet but to some pain with palpation of the feet.  No erythema.  Strong pulses on extremities.     ED Results / Procedures / Treatments   Labs (all labs ordered are listed, but only abnormal results are displayed) Labs Reviewed  COMPREHENSIVE METABOLIC PANEL - Abnormal; Notable for the following components:      Result Value   Glucose, Bld 103 (*)    Albumin 3.4 (*)    All other components within normal limits  CBC - Abnormal; Notable for the following components:   RBC 3.78 (*)    MCV 105.0 (*)    MCH 34.1 (*)    All other components within normal limits  TSH    EKG None  Radiology No results found.  Procedures Procedures    Medications Ordered in ED Medications - No data to display  ED Course/ Medical Decision Making/ A&P                                 Medical Decision Making Amount and/or Complexity of Data Reviewed Labs: ordered.  Risk Prescription drug management.   Patient with numbness on hands and feet.  More of a paresthesia.  I think most likely neuropathy.  Will check TSH since it has been off recently.  Likely will need more extensive workup.  Already has a neurologist who she can follow-up with.  Discussed with pharmacist about potential treatment options for neuropathy.  Discussed with pharmacist.  Neurontin not recommended.  Cymbalta potentially could be used, although patient is already on Paxil and would not want to just start this medicine to have changed by neurology and have to deal with withdrawal or taper.  Will get  a few pain pills but can follow-up with neurology.  States she wants to start following up with a neurologist in town.  Given information for that.  Will discharge.  TSH has been ordered and can be followed by PCP.        Final Clinical Impression(s) / ED Diagnoses Final diagnoses:  Neuropathy    Rx / DC Orders ED Discharge Orders  Ordered    oxyCODONE-acetaminophen (PERCOCET/ROXICET) 5-325 MG tablet  Every 8 hours PRN        10/20/22 1112              Benjiman Core, MD 10/20/22 1215

## 2022-10-20 NOTE — ED Triage Notes (Signed)
Pt came in via POV d/t numbness in her bil hands & feet for the last 2 months. A/Ox4, rates her pain 7/10 when she walks.

## 2022-10-20 NOTE — Discharge Instructions (Signed)
Follow-up with on the groups.  Take the stronger pain medicine only as needed.  A TSH has been done which can be followed by your primary care doctor.

## 2023-07-26 NOTE — Telephone Encounter (Signed)
 Hello,    After review the patient is out of our catchment area. Please advise     Thank you

## 2023-08-18 ENCOUNTER — Emergency Department (HOSPITAL_COMMUNITY)

## 2023-08-18 ENCOUNTER — Inpatient Hospital Stay (HOSPITAL_COMMUNITY)

## 2023-08-18 ENCOUNTER — Inpatient Hospital Stay (HOSPITAL_COMMUNITY)
Admission: EM | Admit: 2023-08-18 | Discharge: 2023-09-07 | DRG: 871 | Disposition: A | Discharge status: Rehabilitation Facility | Attending: Family Medicine | Admitting: Family Medicine

## 2023-08-18 ENCOUNTER — Encounter (HOSPITAL_COMMUNITY): Payer: Self-pay | Admitting: Internal Medicine

## 2023-08-18 DIAGNOSIS — J189 Pneumonia, unspecified organism: Secondary | ICD-10-CM | POA: Diagnosis present

## 2023-08-18 DIAGNOSIS — G4730 Sleep apnea, unspecified: Secondary | ICD-10-CM | POA: Diagnosis present

## 2023-08-18 DIAGNOSIS — R6520 Severe sepsis without septic shock: Secondary | ICD-10-CM | POA: Diagnosis present

## 2023-08-18 DIAGNOSIS — Z1152 Encounter for screening for COVID-19: Secondary | ICD-10-CM

## 2023-08-18 DIAGNOSIS — N179 Acute kidney failure, unspecified: Secondary | ICD-10-CM | POA: Diagnosis present

## 2023-08-18 DIAGNOSIS — R0602 Shortness of breath: Principal | ICD-10-CM

## 2023-08-18 DIAGNOSIS — D649 Anemia, unspecified: Secondary | ICD-10-CM | POA: Diagnosis not present

## 2023-08-18 DIAGNOSIS — Z6841 Body Mass Index (BMI) 40.0 and over, adult: Secondary | ICD-10-CM | POA: Diagnosis not present

## 2023-08-18 DIAGNOSIS — I21A1 Myocardial infarction type 2: Secondary | ICD-10-CM | POA: Diagnosis present

## 2023-08-18 DIAGNOSIS — Z9981 Dependence on supplemental oxygen: Secondary | ICD-10-CM

## 2023-08-18 DIAGNOSIS — E66813 Obesity, class 3: Secondary | ICD-10-CM | POA: Diagnosis present

## 2023-08-18 DIAGNOSIS — F419 Anxiety disorder, unspecified: Secondary | ICD-10-CM | POA: Diagnosis present

## 2023-08-18 DIAGNOSIS — D259 Leiomyoma of uterus, unspecified: Secondary | ICD-10-CM | POA: Diagnosis present

## 2023-08-18 DIAGNOSIS — R7989 Other specified abnormal findings of blood chemistry: Secondary | ICD-10-CM | POA: Diagnosis not present

## 2023-08-18 DIAGNOSIS — I739 Peripheral vascular disease, unspecified: Secondary | ICD-10-CM | POA: Diagnosis present

## 2023-08-18 DIAGNOSIS — A419 Sepsis, unspecified organism: Secondary | ICD-10-CM | POA: Diagnosis present

## 2023-08-18 DIAGNOSIS — E785 Hyperlipidemia, unspecified: Secondary | ICD-10-CM | POA: Diagnosis present

## 2023-08-18 DIAGNOSIS — Z9851 Tubal ligation status: Secondary | ICD-10-CM

## 2023-08-18 DIAGNOSIS — Z87891 Personal history of nicotine dependence: Secondary | ICD-10-CM

## 2023-08-18 DIAGNOSIS — Z79890 Hormone replacement therapy (postmenopausal): Secondary | ICD-10-CM

## 2023-08-18 DIAGNOSIS — Z79899 Other long term (current) drug therapy: Secondary | ICD-10-CM

## 2023-08-18 DIAGNOSIS — H02401 Unspecified ptosis of right eyelid: Secondary | ICD-10-CM | POA: Diagnosis present

## 2023-08-18 DIAGNOSIS — I10 Essential (primary) hypertension: Secondary | ICD-10-CM | POA: Diagnosis present

## 2023-08-18 DIAGNOSIS — E039 Hypothyroidism, unspecified: Secondary | ICD-10-CM | POA: Diagnosis present

## 2023-08-18 DIAGNOSIS — G7001 Myasthenia gravis with (acute) exacerbation: Secondary | ICD-10-CM | POA: Diagnosis not present

## 2023-08-18 DIAGNOSIS — Z79624 Long term (current) use of inhibitors of nucleotide synthesis: Secondary | ICD-10-CM

## 2023-08-18 DIAGNOSIS — G629 Polyneuropathy, unspecified: Secondary | ICD-10-CM | POA: Diagnosis present

## 2023-08-18 DIAGNOSIS — M1711 Unilateral primary osteoarthritis, right knee: Secondary | ICD-10-CM | POA: Diagnosis present

## 2023-08-18 DIAGNOSIS — G7000 Myasthenia gravis without (acute) exacerbation: Secondary | ICD-10-CM | POA: Diagnosis not present

## 2023-08-18 DIAGNOSIS — E878 Other disorders of electrolyte and fluid balance, not elsewhere classified: Secondary | ICD-10-CM | POA: Diagnosis not present

## 2023-08-18 DIAGNOSIS — J9602 Acute respiratory failure with hypercapnia: Secondary | ICD-10-CM | POA: Diagnosis not present

## 2023-08-18 DIAGNOSIS — M79661 Pain in right lower leg: Secondary | ICD-10-CM | POA: Diagnosis not present

## 2023-08-18 DIAGNOSIS — J9611 Chronic respiratory failure with hypoxia: Secondary | ICD-10-CM | POA: Diagnosis not present

## 2023-08-18 DIAGNOSIS — S36039A Unspecified laceration of spleen, initial encounter: Secondary | ICD-10-CM | POA: Diagnosis present

## 2023-08-18 DIAGNOSIS — E875 Hyperkalemia: Secondary | ICD-10-CM | POA: Diagnosis not present

## 2023-08-18 DIAGNOSIS — D72829 Elevated white blood cell count, unspecified: Secondary | ICD-10-CM | POA: Diagnosis not present

## 2023-08-18 DIAGNOSIS — Z86711 Personal history of pulmonary embolism: Secondary | ICD-10-CM

## 2023-08-18 DIAGNOSIS — I959 Hypotension, unspecified: Secondary | ICD-10-CM | POA: Diagnosis not present

## 2023-08-18 DIAGNOSIS — J9620 Acute and chronic respiratory failure, unspecified whether with hypoxia or hypercapnia: Secondary | ICD-10-CM | POA: Diagnosis not present

## 2023-08-18 DIAGNOSIS — F32A Depression, unspecified: Secondary | ICD-10-CM | POA: Diagnosis present

## 2023-08-18 DIAGNOSIS — G5793 Unspecified mononeuropathy of bilateral lower limbs: Secondary | ICD-10-CM | POA: Diagnosis not present

## 2023-08-18 DIAGNOSIS — R5381 Other malaise: Secondary | ICD-10-CM | POA: Diagnosis not present

## 2023-08-18 DIAGNOSIS — H5010 Unspecified exotropia: Secondary | ICD-10-CM | POA: Diagnosis present

## 2023-08-18 DIAGNOSIS — R350 Frequency of micturition: Secondary | ICD-10-CM | POA: Diagnosis present

## 2023-08-18 DIAGNOSIS — D696 Thrombocytopenia, unspecified: Secondary | ICD-10-CM | POA: Diagnosis present

## 2023-08-18 DIAGNOSIS — E870 Hyperosmolality and hypernatremia: Secondary | ICD-10-CM | POA: Diagnosis not present

## 2023-08-18 DIAGNOSIS — E8809 Other disorders of plasma-protein metabolism, not elsewhere classified: Secondary | ICD-10-CM | POA: Diagnosis present

## 2023-08-18 DIAGNOSIS — J9612 Chronic respiratory failure with hypercapnia: Secondary | ICD-10-CM | POA: Diagnosis not present

## 2023-08-18 DIAGNOSIS — E871 Hypo-osmolality and hyponatremia: Secondary | ICD-10-CM | POA: Diagnosis present

## 2023-08-18 DIAGNOSIS — J9622 Acute and chronic respiratory failure with hypercapnia: Secondary | ICD-10-CM | POA: Diagnosis present

## 2023-08-18 DIAGNOSIS — J9601 Acute respiratory failure with hypoxia: Secondary | ICD-10-CM | POA: Diagnosis not present

## 2023-08-18 DIAGNOSIS — E876 Hypokalemia: Secondary | ICD-10-CM | POA: Diagnosis not present

## 2023-08-18 DIAGNOSIS — W19XXXA Unspecified fall, initial encounter: Secondary | ICD-10-CM | POA: Diagnosis present

## 2023-08-18 DIAGNOSIS — M79606 Pain in leg, unspecified: Secondary | ICD-10-CM | POA: Diagnosis not present

## 2023-08-18 DIAGNOSIS — J4540 Moderate persistent asthma, uncomplicated: Secondary | ICD-10-CM | POA: Diagnosis present

## 2023-08-18 DIAGNOSIS — E662 Morbid (severe) obesity with alveolar hypoventilation: Secondary | ICD-10-CM | POA: Diagnosis present

## 2023-08-18 DIAGNOSIS — Z7951 Long term (current) use of inhaled steroids: Secondary | ICD-10-CM

## 2023-08-18 DIAGNOSIS — D631 Anemia in chronic kidney disease: Secondary | ICD-10-CM | POA: Diagnosis not present

## 2023-08-18 DIAGNOSIS — R531 Weakness: Secondary | ICD-10-CM | POA: Diagnosis not present

## 2023-08-18 DIAGNOSIS — J9621 Acute and chronic respiratory failure with hypoxia: Secondary | ICD-10-CM | POA: Diagnosis present

## 2023-08-18 DIAGNOSIS — G609 Hereditary and idiopathic neuropathy, unspecified: Secondary | ICD-10-CM | POA: Diagnosis not present

## 2023-08-18 DIAGNOSIS — M25561 Pain in right knee: Secondary | ICD-10-CM | POA: Diagnosis not present

## 2023-08-18 LAB — CBC WITH DIFFERENTIAL/PLATELET
Abs Immature Granulocytes: 0.15 K/uL — ABNORMAL HIGH (ref 0.00–0.07)
Basophils Absolute: 0 K/uL (ref 0.0–0.1)
Basophils Relative: 0 %
Eosinophils Absolute: 0 K/uL (ref 0.0–0.5)
Eosinophils Relative: 0 %
HCT: 42.2 % (ref 36.0–46.0)
Hemoglobin: 12.6 g/dL (ref 12.0–15.0)
Immature Granulocytes: 1 %
Lymphocytes Relative: 9 %
Lymphs Abs: 1.5 K/uL (ref 0.7–4.0)
MCH: 29.3 pg (ref 26.0–34.0)
MCHC: 29.9 g/dL — ABNORMAL LOW (ref 30.0–36.0)
MCV: 98.1 fL (ref 80.0–100.0)
Monocytes Absolute: 0.9 K/uL (ref 0.1–1.0)
Monocytes Relative: 6 %
Neutro Abs: 13.6 K/uL — ABNORMAL HIGH (ref 1.7–7.7)
Neutrophils Relative %: 84 %
Platelets: 116 K/uL — ABNORMAL LOW (ref 150–400)
RBC: 4.3 MIL/uL (ref 3.87–5.11)
RDW: 15.8 % — ABNORMAL HIGH (ref 11.5–15.5)
WBC: 16.2 K/uL — ABNORMAL HIGH (ref 4.0–10.5)
nRBC: 0 % (ref 0.0–0.2)

## 2023-08-18 LAB — I-STAT VENOUS BLOOD GAS, ED
Acid-Base Excess: 11 mmol/L — ABNORMAL HIGH (ref 0.0–2.0)
Bicarbonate: 41.2 mmol/L — ABNORMAL HIGH (ref 20.0–28.0)
Calcium, Ion: 1.19 mmol/L (ref 1.15–1.40)
HCT: 42 % (ref 36.0–46.0)
Hemoglobin: 14.3 g/dL (ref 12.0–15.0)
O2 Saturation: 68 %
Potassium: 5.3 mmol/L — ABNORMAL HIGH (ref 3.5–5.1)
Sodium: 139 mmol/L (ref 135–145)
TCO2: 44 mmol/L — ABNORMAL HIGH (ref 22–32)
pCO2, Ven: 85.6 mmHg (ref 44–60)
pH, Ven: 7.291 (ref 7.25–7.43)
pO2, Ven: 42 mmHg (ref 32–45)

## 2023-08-18 LAB — URINALYSIS, W/ REFLEX TO CULTURE (INFECTION SUSPECTED)
Bacteria, UA: NONE SEEN
Bilirubin Urine: NEGATIVE
Glucose, UA: NEGATIVE mg/dL
Hgb urine dipstick: NEGATIVE
Ketones, ur: NEGATIVE mg/dL
Leukocytes,Ua: NEGATIVE
Nitrite: NEGATIVE
Protein, ur: 100 mg/dL — AB
Specific Gravity, Urine: 1.01 (ref 1.005–1.030)
pH: 5 (ref 5.0–8.0)

## 2023-08-18 LAB — COMPREHENSIVE METABOLIC PANEL WITH GFR
ALT: 13 U/L (ref 0–44)
AST: 59 U/L — ABNORMAL HIGH (ref 15–41)
Albumin: 3.2 g/dL — ABNORMAL LOW (ref 3.5–5.0)
Alkaline Phosphatase: 52 U/L (ref 38–126)
Anion gap: 7 (ref 5–15)
BUN: 7 mg/dL (ref 6–20)
CO2: 34 mmol/L — ABNORMAL HIGH (ref 22–32)
Calcium: 9 mg/dL (ref 8.9–10.3)
Chloride: 99 mmol/L (ref 98–111)
Creatinine, Ser: 0.84 mg/dL (ref 0.44–1.00)
GFR, Estimated: >60 mL/min (ref >60)
Glucose, Bld: 109 mg/dL — ABNORMAL HIGH (ref 70–99)
Potassium: 5.1 mmol/L (ref 3.5–5.1)
Sodium: 140 mmol/L (ref 135–145)
Total Bilirubin: 1.2 mg/dL (ref 0.0–1.2)
Total Protein: 6.6 g/dL (ref 6.5–8.1)

## 2023-08-18 LAB — PROTIME-INR
INR: 1.2 (ref 0.8–1.2)
Prothrombin Time: 16.2 s — ABNORMAL HIGH (ref 11.4–15.2)

## 2023-08-18 LAB — TROPONIN I (HIGH SENSITIVITY)
Troponin I (High Sensitivity): 130 ng/L (ref <18)
Troponin I (High Sensitivity): 123 ng/L (ref <18)

## 2023-08-18 LAB — I-STAT CG4 LACTIC ACID, ED
Lactic Acid, Venous: 1 mmol/L (ref 0.5–1.9)
Lactic Acid, Venous: 2.6 mmol/L (ref 0.5–1.9)
Lactic Acid, Venous: 0.8 mmol/L (ref 0.5–1.9)

## 2023-08-18 LAB — RESP PANEL BY RT-PCR (RSV, FLU A&B, COVID)  RVPGX2
Influenza A by PCR: NEGATIVE
Influenza B by PCR: NEGATIVE
Resp Syncytial Virus by PCR: NEGATIVE
SARS Coronavirus 2 by RT PCR: NEGATIVE

## 2023-08-18 LAB — BRAIN NATRIURETIC PEPTIDE: B Natriuretic Peptide: 105 pg/mL — ABNORMAL HIGH (ref 0.0–100.0)

## 2023-08-18 MED ORDER — SODIUM CHLORIDE 0.9 % IV SOLN
2.0000 g | Freq: Once | INTRAVENOUS | Status: AC
  Administered 2023-08-18: 2 g via INTRAVENOUS
  Filled 2023-08-18: qty 20

## 2023-08-18 MED ORDER — MIRTAZAPINE 15 MG PO TABS
15.0000 mg | ORAL_TABLET | Freq: Every day | ORAL | Status: DC
  Administered 2023-08-19 – 2023-08-24 (×6): 15 mg via ORAL
  Filled 2023-08-18 (×7): qty 1

## 2023-08-18 MED ORDER — MONTELUKAST SODIUM 10 MG PO TABS
10.0000 mg | ORAL_TABLET | Freq: Every day | ORAL | Status: DC
  Administered 2023-08-19 – 2023-08-24 (×5): 10 mg via ORAL
  Filled 2023-08-18 (×5): qty 1

## 2023-08-18 MED ORDER — SODIUM CHLORIDE 0.9% FLUSH
3.0000 mL | Freq: Two times a day (BID) | INTRAVENOUS | Status: DC
  Administered 2023-08-18 – 2023-08-26 (×12): 3 mL via INTRAVENOUS

## 2023-08-18 MED ORDER — SENNOSIDES-DOCUSATE SODIUM 8.6-50 MG PO TABS
1.0000 | ORAL_TABLET | Freq: Every evening | ORAL | Status: DC | PRN
Start: 2023-08-18 — End: 2023-08-25

## 2023-08-18 MED ORDER — ACETAMINOPHEN 325 MG PO TABS
650.0000 mg | ORAL_TABLET | Freq: Four times a day (QID) | ORAL | Status: DC | PRN
  Administered 2023-08-19 – 2023-08-23 (×3): 650 mg via ORAL
  Filled 2023-08-18 (×3): qty 2

## 2023-08-18 MED ORDER — ALBUTEROL SULFATE (2.5 MG/3ML) 0.083% IN NEBU
2.5000 mg | INHALATION_SOLUTION | Freq: Four times a day (QID) | RESPIRATORY_TRACT | Status: DC | PRN
  Administered 2023-09-03: 2.5 mg via RESPIRATORY_TRACT
  Filled 2023-08-18 (×2): qty 3

## 2023-08-18 MED ORDER — ONDANSETRON HCL 4 MG/2ML IJ SOLN
4.0000 mg | Freq: Four times a day (QID) | INTRAMUSCULAR | Status: DC | PRN
  Administered 2023-08-19 – 2023-08-23 (×6): 4 mg via INTRAVENOUS
  Filled 2023-08-18 (×6): qty 2

## 2023-08-18 MED ORDER — IOHEXOL 350 MG/ML SOLN
100.0000 mL | Freq: Once | INTRAVENOUS | Status: AC | PRN
  Administered 2023-08-18: 100 mL via INTRAVENOUS

## 2023-08-18 MED ORDER — GUAIFENESIN ER 600 MG PO TB12
600.0000 mg | ORAL_TABLET | Freq: Two times a day (BID) | ORAL | Status: DC
  Administered 2023-08-18 – 2023-08-24 (×11): 600 mg via ORAL
  Filled 2023-08-18 (×12): qty 1

## 2023-08-18 MED ORDER — LACTATED RINGERS IV BOLUS (SEPSIS)
1000.0000 mL | Freq: Once | INTRAVENOUS | Status: AC
  Administered 2023-08-18: 1000 mL via INTRAVENOUS

## 2023-08-18 MED ORDER — ARIPIPRAZOLE 2 MG PO TABS
2.0000 mg | ORAL_TABLET | Freq: Every day | ORAL | Status: DC
  Administered 2023-08-19 – 2023-08-24 (×5): 2 mg via ORAL
  Filled 2023-08-18 (×8): qty 1

## 2023-08-18 MED ORDER — IOHEXOL 350 MG/ML SOLN
75.0000 mL | Freq: Once | INTRAVENOUS | Status: AC | PRN
  Administered 2023-08-18: 75 mL via INTRAVENOUS

## 2023-08-18 MED ORDER — ONDANSETRON HCL 4 MG PO TABS: 4.0000 mg | ORAL_TABLET | Freq: Four times a day (QID) | ORAL | Status: DC | PRN

## 2023-08-18 MED ORDER — LEVOTHYROXINE SODIUM 100 MCG PO TABS
200.0000 ug | ORAL_TABLET | Freq: Every day | ORAL | Status: DC
  Administered 2023-08-19 – 2023-08-25 (×7): 200 ug via ORAL
  Filled 2023-08-18 (×7): qty 2

## 2023-08-18 MED ORDER — LACTATED RINGERS IV SOLN: INTRAVENOUS | Status: DC

## 2023-08-18 MED ORDER — ACETAMINOPHEN 650 MG RE SUPP
650.0000 mg | Freq: Four times a day (QID) | RECTAL | Status: DC | PRN
Start: 2023-08-18 — End: 2023-08-25

## 2023-08-18 MED ORDER — PYRIDOSTIGMINE BROMIDE 60 MG PO TABS
30.0000 mg | ORAL_TABLET | Freq: Every day | ORAL | Status: DC
  Administered 2023-08-19 – 2023-08-25 (×30): 30 mg via ORAL
  Filled 2023-08-18 (×35): qty 0.5

## 2023-08-18 MED ORDER — SODIUM CHLORIDE 0.9 % IV SOLN
2.0000 g | INTRAVENOUS | Status: AC
  Administered 2023-08-19 – 2023-08-27 (×9): 2 g via INTRAVENOUS
  Filled 2023-08-18 (×9): qty 20

## 2023-08-18 MED ORDER — DOXYCYCLINE HYCLATE 100 MG PO TABS
100.0000 mg | ORAL_TABLET | Freq: Two times a day (BID) | ORAL | Status: DC
  Administered 2023-08-18 – 2023-08-19 (×3): 100 mg via ORAL
  Filled 2023-08-18 (×3): qty 1

## 2023-08-18 MED ORDER — LOSARTAN POTASSIUM 50 MG PO TABS
50.0000 mg | ORAL_TABLET | Freq: Every day | ORAL | Status: DC
  Administered 2023-08-19 – 2023-08-24 (×5): 50 mg via ORAL
  Filled 2023-08-18 (×6): qty 1

## 2023-08-18 MED ORDER — FAMOTIDINE 20 MG PO TABS
20.0000 mg | ORAL_TABLET | Freq: Two times a day (BID) | ORAL | Status: DC
  Administered 2023-08-19 (×2): 20 mg via ORAL
  Filled 2023-08-18 (×2): qty 1

## 2023-08-18 MED ORDER — PREGABALIN 100 MG PO CAPS
200.0000 mg | ORAL_CAPSULE | Freq: Two times a day (BID) | ORAL | Status: DC
  Administered 2023-08-19 – 2023-08-24 (×11): 200 mg via ORAL
  Filled 2023-08-18 (×12): qty 2

## 2023-08-18 NOTE — Consult Note (Signed)
 CC/Reason for consult: splenic laceration  Requesting physician: Jorie Blanch, MD  HPI: [name] is an 58 y.o. female with hx of obesity, depression, asthma, OSA, myasthenia gravis, chronic hypoxic hypercarbic respiratory failure on 2 L O2, remote history of PE (not on anticoagulation), HTN, hypothyroidism presenting the emergency department for evaluation of shortness of breath.  She underwent workup in the emergency department including a CT of her chest as well as a CT abdomen/pelvis.  CT abdomen pelvis demonstrated equivocal findings for possible splenic laceration versus heterogeneous enhancement.  She is subsequently being admitted by our hospitalist service at which point she had noted left sided abdominal pain.  We are asked to see in consultation.  Currently, she tells me that she has more upper quadrant discomfort on both the right and left side but more so on the right.  She notes that this pain is aggravated by coughing.  Her sisters are at bedside and they report that she does have a chronic cough which does appear to be worse more recently.  She denies any history of trauma to the abdomen or any trauma for that matter in the last couple of weeks.  Past Medical History:  Diagnosis Date   Asthma    Depression    Heart murmur    Myasthenia gravis (HCC)    Obesity    Pulmonary embolism (HCC)    Sleep apnea    Thyroid  disease     Past Surgical History:  Procedure Laterality Date   BREAST SURGERY  06/2019   breast reduction   THYROID  SURGERY     TONSILLECTOMY     TUBAL LIGATION      No family history on file.  Social:  reports that she has quit smoking. She has never used smokeless tobacco. She reports current alcohol use. She reports that she does not use drugs.  Allergies: No Known Allergies  Medications: I have reviewed the patient's current medications.  Results for orders placed or performed during the hospital encounter of 08/18/23 (from the past 48  hours)  Comprehensive metabolic panel     Status: Abnormal   Collection Time: 08/18/23  5:48 PM  Result Value Ref Range   Sodium 140 135 - 145 mmol/L   Potassium 5.1 3.5 - 5.1 mmol/L    Comment: HEMOLYSIS AT THIS LEVEL MAY AFFECT RESULT   Chloride 99 98 - 111 mmol/L   CO2 34 (H) 22 - 32 mmol/L   Glucose, Bld 109 (H) 70 - 99 mg/dL    Comment: Glucose reference range applies only to samples taken after fasting for at least 8 hours.   BUN 7 6 - 20 mg/dL   Creatinine, Ser 9.15 0.44 - 1.00 mg/dL   Calcium 9.0 8.9 - 89.6 mg/dL   Total Protein 6.6 6.5 - 8.1 g/dL   Albumin 3.2 (L) 3.5 - 5.0 g/dL   AST 59 (H) 15 - 41 U/L    Comment: HEMOLYSIS AT THIS LEVEL MAY AFFECT RESULT   ALT 13 0 - 44 U/L    Comment: HEMOLYSIS AT THIS LEVEL MAY AFFECT RESULT   Alkaline Phosphatase 52 38 - 126 U/L   Total Bilirubin 1.2 0.0 - 1.2 mg/dL    Comment: HEMOLYSIS AT THIS LEVEL MAY AFFECT RESULT   GFR, Estimated >60 >60 mL/min    Comment: (NOTE) Calculated using the CKD-EPI Creatinine Equation (2021)    Anion gap 7 5 - 15    Comment: Performed at [facility], [address] [place], [place] [address]  CBC with Differential     Status: Abnormal   Collection Time: 08/18/23  5:48 PM  Result Value Ref Range   WBC 16.2 (H) 4.0 - 10.5 K/uL   RBC 4.30 3.87 - 5.11 MIL/uL   Hemoglobin 12.6 12.0 - 15.0 g/dL   HCT 57.7 63.9 - 53.9 %   MCV 98.1 80.0 - 100.0 fL   MCH 29.3 26.0 - 34.0 pg   MCHC 29.9 (L) 30.0 - 36.0 g/dL   RDW 84.1 (H) 88.4 - 84.4 %   Platelets 116 (L) 150 - 400 K/uL   nRBC 0.0 0.0 - 0.2 %   Neutrophils Relative % 84 %   Neutro Abs 13.6 (H) 1.7 - 7.7 K/uL   Lymphocytes Relative 9 %   Lymphs Abs 1.5 0.7 - 4.0 K/uL   Monocytes Relative 6 %   Monocytes Absolute 0.9 0.1 - 1.0 K/uL   Eosinophils Relative 0 %   Eosinophils Absolute 0.0 0.0 - 0.5 K/uL   Basophils Relative 0 %   Basophils Absolute 0.0 0.0 - 0.1 K/uL   Immature Granulocytes 1 %   Abs Immature Granulocytes 0.15 (H) 0.00 - 0.07  K/uL    Comment: Performed at [facility], [address], [place], [place] [address]  Protime-INR     Status: Abnormal   Collection Time: 08/18/23  5:48 PM  Result Value Ref Range   Prothrombin Time 16.2 (H) 11.4 - 15.2 seconds   INR 1.2 0.8 - 1.2    Comment: (NOTE) INR goal varies based on device and disease states. Performed at [facility], [address], [place], [place] [address]   Troponin I (High Sensitivity)     Status: Abnormal   Collection Time: 08/18/23  5:48 PM  Result Value Ref Range   Troponin I (High Sensitivity) 130 (HH) <18 ng/L    Comment: CRITICAL RESULT CALLED TO, READ BACK BY AND VERIFIED WITH LOISE BLACKBIRD RN 08/18/2023 1840 DAVISB (NOTE) Elevated high sensitivity troponin I (hsTnI) values and significant  changes across serial measurements may suggest ACS but many other  chronic and acute conditions are known to elevate hsTnI results.  Refer to the Links section for chest pain algorithms and additional  guidance. Performed at [facility], [address], [place], [place] [address]   Brain natriuretic peptide     Status: Abnormal   Collection Time: 08/18/23  5:48 PM  Result Value Ref Range   B Natriuretic Peptide 105.0 (H) 0.0 - 100.0 pg/mL    Comment: Performed at [facility], [address] [place], [place] [address]  I-Stat Lactic Acid, ED     Status: None   Collection Time: 08/18/23  5:50 PM  Result Value Ref Range   Lactic Acid, Venous 1.0 0.5 - 1.9 mmol/L  Resp panel by RT-PCR (RSV, Flu A&B, Covid) Anterior Nasal Swab     Status: None   Collection Time: 08/18/23  6:06 PM   Specimen: Anterior Nasal Swab  Result Value Ref Range   SARS Coronavirus 2 by RT PCR NEGATIVE NEGATIVE   Influenza A by PCR NEGATIVE NEGATIVE   Influenza B by PCR NEGATIVE NEGATIVE    Comment: (NOTE) The Xpert Xpress SARS-CoV-2/FLU/RSV plus assay is intended as an aid in the diagnosis of influenza from Nasopharyngeal swab specimens and should not  be used as a sole basis for treatment. Nasal washings and aspirates are unacceptable for Xpert Xpress SARS-CoV-2/FLU/RSV  testing.  Fact Sheet for Patients: BloggerCourse.com  Fact Sheet for Healthcare Providers: SeriousBroker.it  This test is not yet approved or cleared by the [place]  FDA and has been authorized for detection and/or diagnosis of SARS-CoV-2 by FDA under an Emergency Use Authorization (EUA). This EUA will remain in effect (meaning this test can be used) for the duration of the COVID-19 declaration under Section 564(b)(1) of the Act, 21 U.S.C. section 360bbb-3(b)(1), unless the authorization is terminated or revoked.     Resp Syncytial Virus by PCR NEGATIVE NEGATIVE    Comment: (NOTE) Fact Sheet for Patients: BloggerCourse.com  Fact Sheet for Healthcare Providers: SeriousBroker.it  This test is not yet approved or cleared by the [place]  FDA and has been authorized for detection and/or diagnosis of SARS-CoV-2 by FDA under an Emergency Use Authorization (EUA). This EUA will remain in effect (meaning this test can be used) for the duration of the COVID-19 declaration under Section 564(b)(1) of the Act, 21 U.S.C. section 360bbb-3(b)(1), unless the authorization is terminated or revoked.  Performed at [facility], [address] [place], [place] [address]   Urinalysis, w/ Reflex to Culture (Infection Suspected) -Urine, Clean Catch     Status: Abnormal   Collection Time: 08/18/23  6:53 PM  Result Value Ref Range   Specimen Source URINE, CLEAN CATCH    Color, Urine YELLOW YELLOW   APPearance HAZY (A) CLEAR   Specific Gravity, Urine 1.010 1.005 - 1.030   pH 5.0 5.0 - 8.0   Glucose, UA NEGATIVE NEGATIVE mg/dL   Hgb urine dipstick NEGATIVE NEGATIVE   Bilirubin Urine NEGATIVE NEGATIVE   Ketones, ur NEGATIVE NEGATIVE mg/dL   Protein, ur 899 (A)  NEGATIVE mg/dL   Nitrite NEGATIVE NEGATIVE   Leukocytes,Ua NEGATIVE NEGATIVE   RBC / HPF 0-5 0 - 5 RBC/hpf   WBC, UA 0-5 0 - 5 WBC/hpf    Comment:        Reflex urine culture not performed if WBC <=10, OR if Squamous epithelial cells >5. If Squamous epithelial cells >5 suggest recollection.    Bacteria, UA NONE SEEN NONE SEEN   Squamous Epithelial / HPF 0-5 0 - 5 /HPF   Mucus PRESENT     Comment: Performed at [facility], [address], [place], [place] [address]  I-Stat venous blood gas, [facility], [facility], [facility])     Status: Abnormal   Collection Time: 08/18/23  7:05 PM  Result Value Ref Range   pH, Ven 7.291 7.25 - 7.43   pCO2, Ven 85.6 (HH) 44 - 60 mmHg   pO2, Ven 42 32 - 45 mmHg   Bicarbonate 41.2 (H) 20.0 - 28.0 mmol/L   TCO2 44 (H) 22 - 32 mmol/L   O2 Saturation 68 %   Acid-Base Excess 11.0 (H) 0.0 - 2.0 mmol/L   Sodium 139 135 - 145 mmol/L   Potassium 5.3 (H) 3.5 - 5.1 mmol/L   Calcium, Ion 1.19 1.15 - 1.40 mmol/L   HCT 42.0 36.0 - 46.0 %   Hemoglobin 14.3 12.0 - 15.0 g/dL   Sample type VENOUS    Comment NOTIFIED PHYSICIAN   I-Stat Lactic Acid, ED     Status: Abnormal   Collection Time: 08/18/23  7:16 PM  Result Value Ref Range   Lactic Acid, Venous 2.6 (HH) 0.5 - 1.9 mmol/L   Comment NOTIFIED PHYSICIAN   Troponin I (High Sensitivity)     Status: Abnormal   Collection Time: 08/18/23  7:51  PM  Result Value Ref Range   Troponin I (High Sensitivity) 123 (HH) <18 ng/L    Comment: CRITICAL VALUE NOTED. VALUE IS CONSISTENT WITH PREVIOUSLY REPORTED/CALLED VALUE (NOTE) Elevated high sensitivity troponin I (hsTnI) values and significant  changes across serial measurements may suggest ACS but many other  chronic and acute conditions are known to elevate hsTnI results.  Refer to the Links section for chest pain algorithms and additional  guidance. Performed at [facility], [address], [place], [place] [address]     CT Angio Chest PE W/Cm &/Or Wo  Cm Result Date: 08/18/2023 CLINICAL DATA:  Shortness of breath. EXAM: CT ANGIOGRAPHY CHEST WITH CONTRAST TECHNIQUE: Multidetector CT imaging of the chest was performed using the standard protocol during bolus administration of intravenous contrast. Multiplanar CT image reconstructions and MIPs were obtained to evaluate the vascular anatomy. RADIATION DOSE REDUCTION: This exam was performed according to the departmental dose-optimization program which includes automated exposure control, adjustment of the mA and/or kV according to patient size and/or use of iterative reconstruction technique. CONTRAST:  75mL OMNIPAQUE  IOHEXOL  350 MG/ML SOLN COMPARISON:  December 21, 2018. FINDINGS: Cardiovascular: No evidence of large central pulmonary embolus seen in the main pulmonary artery or proximal portions of the left and right pulmonary arteries. However, lower lobe branches bilaterally are not well visualized due to surrounding atelectasis and limited opacification, and therefore smaller and more peripheral pulmonary emboli cannot be excluded on the basis of this exam. Mild cardiomegaly is noted. No pericardial effusion. Mediastinum/Nodes: No enlarged mediastinal, hilar, or axillary lymph nodes. Thyroid  gland, trachea, and esophagus demonstrate no significant findings. Lungs/Pleura: No pneumothorax or pleural effusion is noted. Bilateral posterior basilar atelectasis is noted. Upper Abdomen: No acute abnormality. Musculoskeletal: No chest wall abnormality. No acute or significant osseous findings. Review of the MIP images confirms the above findings. IMPRESSION: No definite evidence of large central pulmonary embolus seen in main pulmonary artery or proximal portions of left and right pulmonary arteries. However, due to adjacent atelectasis and limited contrast opacification, lower lobe branches of both pulmonary arteries are not well visualized and therefore smaller and more peripheral pulmonary emboli cannot be excluded  on the basis of this exam. Electronically Signed   By: Lynwood Landy Raddle M.D.   On: 08/18/2023 19:31   CT ABDOMEN PELVIS W CONTRAST Result Date: 08/18/2023 CLINICAL DATA:  Sepsis. EXAM: CT ABDOMEN AND PELVIS WITH CONTRAST TECHNIQUE: Multidetector CT imaging of the abdomen and pelvis was performed using the standard protocol following bolus administration of intravenous contrast. RADIATION DOSE REDUCTION: This exam was performed according to the departmental dose-optimization program which includes automated exposure control, adjustment of the mA and/or kV according to patient size and/or use of iterative reconstruction technique. CONTRAST:  75mL OMNIPAQUE  IOHEXOL  350 MG/ML SOLN COMPARISON:  CT abdomen and pelvis 03/09/2018 FINDINGS: Lower chest: There are mild patchy airspace opacities in both lower lobes, left greater than right. Hepatobiliary: The liver is mildly enlarged. No focal liver lesions are seen. Gallbladder and bile ducts are within normal limits. Pancreas: Unremarkable. No pancreatic ductal dilatation or surrounding inflammatory changes. Spleen: The spleen appears heterogeneously enhancing which may be related to phase of contrast. Splenic laceration and subcapsular fluid collections are a possibility as well. Overall measurements of the spleen are slightly larger when compared to 2020. There is no surrounding inflammatory stranding. Adrenals/Urinary Tract: Bilateral renal cysts are present measuring up to 3.4 cm on the right. There is no hydronephrosis. The adrenal glands and bladder are  within normal limits. Stomach/Bowel: Stomach is within normal limits. Appendix appears normal. No evidence of bowel wall thickening, distention, or inflammatory changes. There is sigmoid colon diverticulosis. Vascular/Lymphatic: Aortic atherosclerosis. No enlarged abdominal or pelvic lymph nodes. Reproductive: Uterus is enlarged with multiple fibroids. Largest fibroid is on the right measuring 6.8 cm. Adnexa are  within normal limits. Other: There is mild diffuse body wall edema. There is no focal abdominal wall hernia. There is trace fluid in the left upper quadrant. Musculoskeletal: No acute or significant osseous findings. IMPRESSION: 1. Indeterminate heterogeneous enhancement of the spleen with mild enlargement when compared to prior. Splenic laceration and subcapsular fluid collections are a possibility as well as infection. 2. Trace fluid in the left upper quadrant. 3. Mild patchy airspace opacities in both lower lobes, left greater than right, worrisome for pneumonia. 4. Mild hepatomegaly. 5. Uterine fibroids. 6. Aortic atherosclerosis. Aortic Atherosclerosis (ICD10-I70.0). Electronically Signed   By: Greig Pique M.D.   On: 08/18/2023 19:31   DG Chest Port 1 View Result Date: 08/18/2023 CLINICAL DATA:  Question of sepsis to evaluate for abnormality. Shortness of breath and bilateral leg weakness. EXAM: PORTABLE CHEST 1 VIEW COMPARISON:  12/21/2018 FINDINGS: Postoperative changes in the mediastinum. Shallow inspiration. Cardiac enlargement with mild central vascular congestion. Interstitial changes in the lungs likely represent mild interstitial edema. Linear scarring or atelectasis in the lung bases. No pleural effusion pneumothorax. Mediastinal contours appear intact. IMPRESSION: Cardiac enlargement with developing pulmonary vascular congestion and suggestion of mild interstitial edema. Electronically Signed   By: Elsie Gravely M.D.   On: 08/18/2023 17:58    ROS - all of the below systems have been reviewed with the patient and positives are indicated with bold text General: chills, fever or night sweats Eyes: blurry vision or double vision ENT: epistaxis or sore throat Allergy/Immunology: itchy/watery eyes or nasal congestion Hematologic/Lymphatic: bleeding problems, blood clots or swollen lymph nodes Endocrine: temperature intolerance or unexpected weight changes Breast: new or changing breast  lumps or nipple discharge Resp: cough, shortness of breath, or wheezing CV: chest pain or dyspnea on exertion GI: as per HPI GU: dysuria, trouble voiding, or hematuria MSK: joint pain or joint stiffness Neuro: TIA or stroke symptoms Derm: pruritus and skin lesion changes Psych: anxiety and depression  PE Blood pressure 103/80, pulse (!) 102, temperature 99.7 F (37.6 C), temperature source Rectal, resp. rate (!) 23, SpO2 100%. Constitutional: NAD; conversant; wearing nasal cannula O2 Eyes: Moist conjunctiva Lungs: Normal respiratory effort CV: RRR GI: Abd obese, soft, not significantly tender; unable to assess hepatosplenomegaly 2/2 habitus Psychiatric: Appropriate affect; alert and oriented x3  Results for orders placed or performed during the hospital encounter of 08/18/23 (from the past 48 hours)  Comprehensive metabolic panel     Status: Abnormal   Collection Time: 08/18/23  5:48 PM  Result Value Ref Range   Sodium 140 135 - 145 mmol/L   Potassium 5.1 3.5 - 5.1 mmol/L    Comment: HEMOLYSIS AT THIS LEVEL MAY AFFECT RESULT   Chloride 99 98 - 111 mmol/L   CO2 34 (H) 22 - 32 mmol/L   Glucose, Bld 109 (H) 70 - 99 mg/dL    Comment: Glucose reference range applies only to samples taken after fasting for at least 8 hours.   BUN 7 6 - 20 mg/dL   Creatinine, Ser 9.15 0.44 - 1.00 mg/dL   Calcium 9.0 8.9 - 89.6 mg/dL   Total Protein 6.6 6.5 - 8.1 g/dL   Albumin 3.2 (L)  3.5 - 5.0 g/dL   AST 59 (H) 15 - 41 U/L    Comment: HEMOLYSIS AT THIS LEVEL MAY AFFECT RESULT   ALT 13 0 - 44 U/L    Comment: HEMOLYSIS AT THIS LEVEL MAY AFFECT RESULT   Alkaline Phosphatase 52 38 - 126 U/L   Total Bilirubin 1.2 0.0 - 1.2 mg/dL    Comment: HEMOLYSIS AT THIS LEVEL MAY AFFECT RESULT   GFR, Estimated >60 >60 mL/min    Comment: (NOTE) Calculated using the CKD-EPI Creatinine Equation (2021)    Anion gap 7 5 - 15    Comment: Performed at [facility], [address] [place], [place] [address]   CBC with Differential     Status: Abnormal   Collection Time: 08/18/23  5:48 PM  Result Value Ref Range   WBC 16.2 (H) 4.0 - 10.5 K/uL   RBC 4.30 3.87 - 5.11 MIL/uL   Hemoglobin 12.6 12.0 - 15.0 g/dL   HCT 57.7 63.9 - 53.9 %   MCV 98.1 80.0 - 100.0 fL   MCH 29.3 26.0 - 34.0 pg   MCHC 29.9 (L) 30.0 - 36.0 g/dL   RDW 84.1 (H) 88.4 - 84.4 %   Platelets 116 (L) 150 - 400 K/uL   nRBC 0.0 0.0 - 0.2 %   Neutrophils Relative % 84 %   Neutro Abs 13.6 (H) 1.7 - 7.7 K/uL   Lymphocytes Relative 9 %   Lymphs Abs 1.5 0.7 - 4.0 K/uL   Monocytes Relative 6 %   Monocytes Absolute 0.9 0.1 - 1.0 K/uL   Eosinophils Relative 0 %   Eosinophils Absolute 0.0 0.0 - 0.5 K/uL   Basophils Relative 0 %   Basophils Absolute 0.0 0.0 - 0.1 K/uL   Immature Granulocytes 1 %   Abs Immature Granulocytes 0.15 (H) 0.00 - 0.07 K/uL    Comment: Performed at [facility], [address] [place], [place] [address]  Protime-INR     Status: Abnormal   Collection Time: 08/18/23  5:48 PM  Result Value Ref Range   Prothrombin Time 16.2 (H) 11.4 - 15.2 seconds   INR 1.2 0.8 - 1.2    Comment: (NOTE) INR goal varies based on device and disease states. Performed at [facility], [address], [place], [place] [address]   Troponin I (High Sensitivity)     Status: Abnormal   Collection Time: 08/18/23  5:48 PM  Result Value Ref Range   Troponin I (High Sensitivity) 130 (HH) <18 ng/L    Comment: CRITICAL RESULT CALLED TO, READ BACK BY AND VERIFIED WITH LOISE BLACKBIRD RN 08/18/2023 1840 DAVISB (NOTE) Elevated high sensitivity troponin I (hsTnI) values and significant  changes across serial measurements may suggest ACS but many other  chronic and acute conditions are known to elevate hsTnI results.  Refer to the Links section for chest pain algorithms and additional  guidance. Performed at [facility], [address], [place], [place] [address]   Brain natriuretic peptide     Status: Abnormal    Collection Time: 08/18/23  5:48 PM  Result Value Ref Range   B Natriuretic Peptide 105.0 (H) 0.0 - 100.0 pg/mL    Comment: Performed at [facility], [address] [place], [place] [address]  I-Stat Lactic Acid, ED     Status: None   Collection Time: 08/18/23  5:50 PM  Result Value Ref Range   Lactic Acid, Venous 1.0 0.5 -  1.9 mmol/L  Resp panel by RT-PCR (RSV, Flu A&B, Covid) Anterior Nasal Swab     Status: None   Collection Time: 08/18/23  6:06 PM   Specimen: Anterior Nasal Swab  Result Value Ref Range   SARS Coronavirus 2 by RT PCR NEGATIVE NEGATIVE   Influenza A by PCR NEGATIVE NEGATIVE   Influenza B by PCR NEGATIVE NEGATIVE    Comment: (NOTE) The Xpert Xpress SARS-CoV-2/FLU/RSV plus assay is intended as an aid in the diagnosis of influenza from Nasopharyngeal swab specimens and should not be used as a sole basis for treatment. Nasal washings and aspirates are unacceptable for Xpert Xpress SARS-CoV-2/FLU/RSV testing.  Fact Sheet for Patients: BloggerCourse.com  Fact Sheet for Healthcare Providers: SeriousBroker.it  This test is not yet approved or cleared by the [place]  FDA and has been authorized for detection and/or diagnosis of SARS-CoV-2 by FDA under an Emergency Use Authorization (EUA). This EUA will remain in effect (meaning this test can be used) for the duration of the COVID-19 declaration under Section 564(b)(1) of the Act, 21 U.S.C. section 360bbb-3(b)(1), unless the authorization is terminated or revoked.     Resp Syncytial Virus by PCR NEGATIVE NEGATIVE    Comment: (NOTE) Fact Sheet for Patients: BloggerCourse.com  Fact Sheet for Healthcare Providers: SeriousBroker.it  This test is not yet approved or cleared by the [place]  FDA and has been authorized for detection and/or diagnosis of SARS-CoV-2 by FDA under an Emergency Use Authorization  (EUA). This EUA will remain in effect (meaning this test can be used) for the duration of the COVID-19 declaration under Section 564(b)(1) of the Act, 21 U.S.C. section 360bbb-3(b)(1), unless the authorization is terminated or revoked.  Performed at [facility], [address], [place], [place] [address]   Urinalysis, w/ Reflex to Culture (Infection Suspected) -Urine, Clean Catch     Status: Abnormal   Collection Time: 08/18/23  6:53 PM  Result Value Ref Range   Specimen Source URINE, CLEAN CATCH    Color, Urine YELLOW YELLOW   APPearance HAZY (A) CLEAR   Specific Gravity, Urine 1.010 1.005 - 1.030   pH 5.0 5.0 - 8.0   Glucose, UA NEGATIVE NEGATIVE mg/dL   Hgb urine dipstick NEGATIVE NEGATIVE   Bilirubin Urine NEGATIVE NEGATIVE   Ketones, ur NEGATIVE NEGATIVE mg/dL   Protein, ur 899 (A) NEGATIVE mg/dL   Nitrite NEGATIVE NEGATIVE   Leukocytes,Ua NEGATIVE NEGATIVE   RBC / HPF 0-5 0 - 5 RBC/hpf   WBC, UA 0-5 0 - 5 WBC/hpf    Comment:        Reflex urine culture not performed if WBC <=10, OR if Squamous epithelial cells >5. If Squamous epithelial cells >5 suggest recollection.    Bacteria, UA NONE SEEN NONE SEEN   Squamous Epithelial / HPF 0-5 0 - 5 /HPF   Mucus PRESENT     Comment: Performed at [facility], [address] [place], [place] [address]  I-Stat venous blood gas, [facility] ED, [facility], [facility])     Status: Abnormal   Collection Time: 08/18/23  7:05 PM  Result Value Ref Range   pH, Ven 7.291 7.25 - 7.43   pCO2, Ven 85.6 (HH) 44 - 60 mmHg   pO2, Ven 42 32 - 45 mmHg   Bicarbonate 41.2 (H) 20.0 - 28.0 mmol/L   TCO2 44 (H) 22 - 32 mmol/L   O2 Saturation 68 %   Acid-Base Excess 11.0 (H) 0.0 - 2.0 mmol/L  Sodium 139 135 - 145 mmol/L   Potassium 5.3 (H) 3.5 - 5.1 mmol/L   Calcium, Ion 1.19 1.15 - 1.40 mmol/L   HCT 42.0 36.0 - 46.0 %   Hemoglobin 14.3 12.0 - 15.0 g/dL   Sample type VENOUS    Comment NOTIFIED PHYSICIAN   I-Stat Lactic Acid, ED     Status: Abnormal    Collection Time: 08/18/23  7:16 PM  Result Value Ref Range   Lactic Acid, Venous 2.6 (HH) 0.5 - 1.9 mmol/L   Comment NOTIFIED PHYSICIAN   Troponin I (High Sensitivity)     Status: Abnormal   Collection Time: 08/18/23  7:51 PM  Result Value Ref Range   Troponin I (High Sensitivity) 123 (HH) <18 ng/L    Comment: CRITICAL VALUE NOTED. VALUE IS CONSISTENT WITH PREVIOUSLY REPORTED/CALLED VALUE (NOTE) Elevated high sensitivity troponin I (hsTnI) values and significant  changes across serial measurements may suggest ACS but many other  chronic and acute conditions are known to elevate hsTnI results.  Refer to the Links section for chest pain algorithms and additional  guidance. Performed at [facility], [address], [place], [place] [address]     CT Angio Chest PE W/Cm &/Or Wo Cm Result Date: 08/18/2023 CLINICAL DATA:  Shortness of breath. EXAM: CT ANGIOGRAPHY CHEST WITH CONTRAST TECHNIQUE: Multidetector CT imaging of the chest was performed using the standard protocol during bolus administration of intravenous contrast. Multiplanar CT image reconstructions and MIPs were obtained to evaluate the vascular anatomy. RADIATION DOSE REDUCTION: This exam was performed according to the departmental dose-optimization program which includes automated exposure control, adjustment of the mA and/or kV according to patient size and/or use of iterative reconstruction technique. CONTRAST:  75mL OMNIPAQUE  IOHEXOL  350 MG/ML SOLN COMPARISON:  December 21, 2018. FINDINGS: Cardiovascular: No evidence of large central pulmonary embolus seen in the main pulmonary artery or proximal portions of the left and right pulmonary arteries. However, lower lobe branches bilaterally are not well visualized due to surrounding atelectasis and limited opacification, and therefore smaller and more peripheral pulmonary emboli cannot be excluded on the basis of this exam. Mild cardiomegaly is noted. No pericardial effusion.  Mediastinum/Nodes: No enlarged mediastinal, hilar, or axillary lymph nodes. Thyroid  gland, trachea, and esophagus demonstrate no significant findings. Lungs/Pleura: No pneumothorax or pleural effusion is noted. Bilateral posterior basilar atelectasis is noted. Upper Abdomen: No acute abnormality. Musculoskeletal: No chest wall abnormality. No acute or significant osseous findings. Review of the MIP images confirms the above findings. IMPRESSION: No definite evidence of large central pulmonary embolus seen in main pulmonary artery or proximal portions of left and right pulmonary arteries. However, due to adjacent atelectasis and limited contrast opacification, lower lobe branches of both pulmonary arteries are not well visualized and therefore smaller and more peripheral pulmonary emboli cannot be excluded on the basis of this exam. Electronically Signed   By: Lynwood Landy Raddle M.D.   On: 08/18/2023 19:31   CT ABDOMEN PELVIS W CONTRAST Result Date: 08/18/2023 CLINICAL DATA:  Sepsis. EXAM: CT ABDOMEN AND PELVIS WITH CONTRAST TECHNIQUE: Multidetector CT imaging of the abdomen and pelvis was performed using the standard protocol following bolus administration of intravenous contrast. RADIATION DOSE REDUCTION: This exam was performed according to the departmental dose-optimization program which includes automated exposure control, adjustment of the mA and/or kV according to patient size and/or use of iterative reconstruction technique. CONTRAST:  75mL OMNIPAQUE  IOHEXOL  350 MG/ML SOLN COMPARISON:  CT abdomen and pelvis 03/09/2018 FINDINGS: Lower chest: There  are mild patchy airspace opacities in both lower lobes, left greater than right. Hepatobiliary: The liver is mildly enlarged. No focal liver lesions are seen. Gallbladder and bile ducts are within normal limits. Pancreas: Unremarkable. No pancreatic ductal dilatation or surrounding inflammatory changes. Spleen: The spleen appears heterogeneously enhancing which may  be related to phase of contrast. Splenic laceration and subcapsular fluid collections are a possibility as well. Overall measurements of the spleen are slightly larger when compared to 2020. There is no surrounding inflammatory stranding. Adrenals/Urinary Tract: Bilateral renal cysts are present measuring up to 3.4 cm on the right. There is no hydronephrosis. The adrenal glands and bladder are within normal limits. Stomach/Bowel: Stomach is within normal limits. Appendix appears normal. No evidence of bowel wall thickening, distention, or inflammatory changes. There is sigmoid colon diverticulosis. Vascular/Lymphatic: Aortic atherosclerosis. No enlarged abdominal or pelvic lymph nodes. Reproductive: Uterus is enlarged with multiple fibroids. Largest fibroid is on the right measuring 6.8 cm. Adnexa are within normal limits. Other: There is mild diffuse body wall edema. There is no focal abdominal wall hernia. There is trace fluid in the left upper quadrant. Musculoskeletal: No acute or significant osseous findings. IMPRESSION: 1. Indeterminate heterogeneous enhancement of the spleen with mild enlargement when compared to prior. Splenic laceration and subcapsular fluid collections are a possibility as well as infection. 2. Trace fluid in the left upper quadrant. 3. Mild patchy airspace opacities in both lower lobes, left greater than right, worrisome for pneumonia. 4. Mild hepatomegaly. 5. Uterine fibroids. 6. Aortic atherosclerosis. Aortic Atherosclerosis (ICD10-I70.0). Electronically Signed   By: Greig Pique M.D.   On: 08/18/2023 19:31   DG Chest Port 1 View Result Date: 08/18/2023 CLINICAL DATA:  Question of sepsis to evaluate for abnormality. Shortness of breath and bilateral leg weakness. EXAM: PORTABLE CHEST 1 VIEW COMPARISON:  12/21/2018 FINDINGS: Postoperative changes in the mediastinum. Shallow inspiration. Cardiac enlargement with mild central vascular congestion. Interstitial changes in the lungs  likely represent mild interstitial edema. Linear scarring or atelectasis in the lung bases. No pleural effusion pneumothorax. Mediastinal contours appear intact. IMPRESSION: Cardiac enlargement with developing pulmonary vascular congestion and suggestion of mild interstitial edema. Electronically Signed   By: Elsie Gravely M.D.   On: 08/18/2023 17:58    A/P: [name] is an 58 y.o. female with HTN, HLD, obesity, OSA, hypothyroidism, chronic hypoxic resp failure on 2L O2, remote PE here with possible splenic lac in setting of pneumonia  - No reported history of trauma recently that would explain this.  Could be potentially caused by significant coughing although that is very unusual. - Given that she is having left-sided abdominal pain --> CTA to further evaluate whether or not the spleen is in fact involved.  No evidence of extravasation or extra-capsular rupture; for now further observation and monitoring - Q6 H&H for now - Progressive level of care - We will follow with you  I spent a total of 80 minutes in both face-to-face and non-face-to-face activities, excluding procedures performed, for this visit on the date of this encounter.  Lonni Pizza, MD [facility], A [facility]

## 2023-08-18 NOTE — H&P (Addendum)
 History and Physical    [name] FMW:[id number] DOB: 20-Dec-1965 DOA: 08/18/2023  PCP: Minerva Rosina HERO, MD  Patient coming from: Home  I have personally briefly reviewed patient's old medical records in [facility]  Chief Complaint: Shortness of breath  HPI: [name] is a 58 y.o. female with medical history significant for myasthenia gravis, moderate persistent asthma, chronic hypoxic and hypercarbic respiratory failure on 2 L O2 [place], history of PE no longer on anticoagulation, HTN, hypothyroidism, depression, morbid obesity, OSA on CPAP who presented to the ED for evaluation of shortness of breath.  Patient reports developing shortness of breath today, worse with exertion.  She reports 2 days of new cough productive of dark brown sputum.  She says she normally uses 2 L O2 via [place] at all times at home.  She reports adherence to her CPAP.  She denies fevers, chills, diaphoresis.  She has not seen any significantly increased lower extremity edema.  Patient also reports new upper abdominal pain, more pronounced in the left upper quadrant.  This began today as well.  Has not had any obvious traumatic injury or fall.  She does note increased weakness in both lower extremities made and difficult for her to ambulate.  She has a history of remote PE and is no longer on anticoagulation.  ED Course  Labs/Imaging on admission: I have personally reviewed following labs and imaging studies.  Initial vitals showed BP 103/80, pulse 101, RR 23, temp 100.3 F, SpO2 99% on 4 L O2 via [place].  Labs showed WBC 16.2, hemoglobin 12.6, platelets 116, sodium 140, potassium 5.1, bicarb 34, BUN 7, creatinine 0.84, serum glucose 109, troponin 130, BNP 105.  SARS-CoV-2, influenza, RSV PCR negative.  Lactic acid 1.0 > 2.6.  UA negative for UTI.  Blood cultures collected.  CTA chest negative for definite evidence of large central pulmonary embolus in the main pulmonary artery or proximal portions of  left and right pulmonary arteries.  However, due to adjacent atelectasis and limiting contrast opacification, lower lobe branches of both pulmonary arteries are not well-visualized and therefore smaller and more peripheral pulmonary emboli cannot be excluded on the basis of this exam.  CT abdomen/pelvis with contrast showed indeterminate heterogeneous enhancement of the spleen with mild enlargement when compared to prior.  Splenic laceration and subcapsular fluid collections are a possibility as well as infection.  Trace fluid in the left upper quadrant.  Mild patchy airspace opacities in both lower lobes, left greater than right, worrisome for pneumonia.  Mild hepatomegaly.  Uterine fibroids.  Patient was given 1 L LR, IV ceftriaxone .  The hospitalist service was consulted to admit.  Review of Systems: All systems reviewed and are negative except as documented in history of present illness above.   Past Medical History:  Diagnosis Date   Asthma    Depression    Heart murmur    Myasthenia gravis (HCC)    Obesity    Pulmonary embolism (HCC)    Sleep apnea    Thyroid  disease     Past Surgical History:  Procedure Laterality Date   BREAST SURGERY  06/2019   breast reduction   THYROID  SURGERY     TONSILLECTOMY     TUBAL LIGATION      Social History: Social History   Tobacco Use   Smoking status: Former   Smokeless tobacco: Never  Substance Use Topics   Alcohol use: Yes   Drug use: Never   No Known Allergies  History reviewed. No pertinent family history.   Prior to Admission medications   Medication Sig Start Date End Date Taking? Authorizing Provider  acetaminophen  (TYLENOL ) 500 MG tablet Take 500 mg by mouth every 6 (six) hours as needed for mild pain.    [provider]  albuterol  (VENTOLIN  HFA) 108 (90 Base) MCG/ACT inhaler Inhale 2 puffs into the lungs every 4 (four) hours as needed for wheezing or shortness of breath.  05/28/16   [provider]   budesonide -formoterol (SYMBICORT) 160-4.5 MCG/ACT inhaler Inhale 2 puffs into the lungs 3 (three) times daily. 04/26/18   [provider]  famotidine  (PEPCID ) 20 MG tablet Take 1 tablet (20 mg total) by mouth 2 (two) times daily. 03/09/18   Henderly, Britni A, PA-C  fluticasone (FLONASE) 50 MCG/ACT nasal spray Place 1 spray into both nostrils daily.  05/30/15   [provider]  furosemide  (LASIX ) 20 MG tablet Take 1 tablet (20 mg total) by mouth daily. 08/19/16 12/21/18  Vann, Jessica U, DO  ipratropium-albuterol  (DUONEB) 0.5-2.5 (3) MG/3ML SOLN Take 3 mLs by nebulization 3 (three) times daily as needed (wheezing). 08/19/16   Vann, Jessica U, DO  levothyroxine  (SYNTHROID , LEVOTHROID) 112 MCG tablet Take 224 mcg by mouth daily. 07/02/16   [provider]  lisinopril  (PRINIVIL ,ZESTRIL ) 10 MG tablet Take 1 tablet (10 mg total) by mouth daily. 08/20/16   Vann, Jessica U, DO  montelukast  (SINGULAIR ) 10 MG tablet Take 10 mg by mouth at bedtime.    [provider]  mycophenolate  (CELLCEPT ) 500 MG tablet Take 1,000-1,500 mg by mouth See admin instructions. Take 2 tablets in the morning and 3 tablets at bedtime 06/01/16   [provider]  omeprazole (PRILOSEC) 20 MG capsule Take 20 mg by mouth 2 (two) times daily. 05/28/16   [provider]  ondansetron  (ZOFRAN  ODT) 4 MG disintegrating tablet Take 1 tablet (4 mg total) by mouth every 8 (eight) hours as needed for nausea or vomiting. 09/09/18   Phares Sieving, MD  oxyCODONE -acetaminophen  (PERCOCET/ROXICET) 5-325 MG tablet Take 1 tablet by mouth every 8 (eight) hours as needed for severe pain. 10/20/22   Patsey Lot, MD  PARoxetine  (PAXIL ) 30 MG tablet Take 60 mg by mouth daily.     [provider]  potassium chloride  (K-DUR) 10 MEQ tablet Take 1 tablet (10 mEq total) by mouth daily. 08/19/16   Vann, Jessica U, DO  pyridostigmine  (MESTINON ) 60 MG tablet Take 30 mg by mouth 5 (five) times daily.  06/09/16    [provider]  Rivaroxaban  15 & 20 MG TBPK Take as directed on package: Start with one 15mg  tablet by mouth twice a day with food. On Day 22, switch to one 20mg  tablet once a day with food. Patient not taking: Reported on 06/07/2018 08/19/16   Vann, Jessica U, DO  SPIRIVA RESPIMAT 1.25 MCG/ACT AERS Inhale 1-2 puffs into the lungs daily as needed for shortness of breath. 09/15/18   [provider]  traMADol  (ULTRAM ) 50 MG tablet Take 1 tablet (50 mg total) by mouth every 6 (six) hours as needed. Patient not taking: Reported on 06/07/2018 12/26/17   Randol Simmonds, MD    Physical Exam: Vitals:   08/18/23 1647 08/18/23 1757 08/18/23 1832 08/18/23 1832  BP: 103/80     Pulse: (!) 102     Resp: (!) 23     Temp: 100.3 F (37.9 C)  99.7 F (37.6 C)   TempSrc: Oral  Rectal   SpO2: 99% 100%  100%   Constitutional: Obese woman resting in bed with head elevated.  NAD, calm, comfortable Eyes: EOMI, lids and conjunctivae normal ENMT: Mucous membranes are moist. Posterior pharynx clear of any exudate or lesions.Normal dentition.  Neck: normal, supple, no masses. Respiratory: Distant breath sounds otherwise clear to auscultation bilaterally, no wheezing, no crackles. Normal respiratory effort while on 4 L O2 via [place]. No accessory muscle use.  Cardiovascular: Regular rate and rhythm, no murmurs / rubs / gallops. No extremity edema. 2+ pedal pulses. Abdomen: Upper abdominal tenderness, more pronounced left upper quadrant.  No masses palpated. Musculoskeletal: no clubbing / cyanosis. No joint deformity upper and lower extremities. Good ROM, no contractures. Normal muscle tone.  Skin: no rashes, lesions, ulcers. No induration Neurologic: Sensation intact. Strength 5/5 in all 4.  Psychiatric: Normal judgment and insight. Alert and oriented x 3. Normal mood.   EKG: Personally reviewed. Sinus rhythm, rate 101, nonspecific IVCD, low voltage.  Rate is faster when compared to previous from November  2021.  Assessment/Plan Principal Problem:   Sepsis due to pneumonia [facility]) Active Problems:   Acute on chronic respiratory failure with hypoxia and hypercapnia (HCC)   Elevated troponin   Depression   Myasthenia gravis (HCC)   Sleep apnea   Morbid obesity (HCC)   Hypothyroidism   Thrombocytopenia (HCC)   Moderate persistent asthma   CLARISE CHACKO is a 58 y.o. female with medical history significant for myasthenia gravis, moderate persistent asthma, chronic hypoxic and hypercarbic respiratory failure on 2 L O2 [place], history of PE no longer on anticoagulation, HTN, hypothyroidism, depression, morbid obesity, OSA on CPAP who is admitted with sepsis and acute on chronic hypoxic respiratory failure due to bibasilar pneumonia.  Assessment and Plan: Sepsis due to bilateral lower lobe pneumonia: Presenting with leukocytosis, tachycardia, tachypnea.  Imaging suspicious for bilateral lobe pneumonia.  COVID, influenza, RSV negative.  Lactic acidosis has resolved. - Continue ceftriaxone  and doxycycline  - Continue gentle IV fluid hydration overnight - Follow blood cultures, check strep pneumonia and Legionella urine antigens - Continue supplemental O2 as needed and wean to home 2 L [place] as able  Acute on chronic hypoxic and hypercapnic respiratory failure: In setting of pneumonia as above.  Patient uses 2 L O2 [place] at baseline, requiring 4 L O2 [place] on admission.  With history of myasthenia gravis she is at increased risk for respiratory decompensation. - Continue supplemental O2 - NIF/VC q12h checks - Continue CPAP nightly  Elevated troponin: Mild troponin elevation, downtrending on repeat.  Patient denies chest pain.  EKG without significant ST changes.  Likely demand ischemia in setting of sepsis and hypoxic respiratory failure.  Monitor on telemetry.  Abnormal spleen on CT imaging: CT A/P showed indeterminate heterogeneous enhancement of the spleen with mild enlargement when compared to prior.   Splenic laceration and subcapsular fluid collections are possibility as well as infection.  Discussed with general surgery who recommended obtaining CTA abdomen and they will see in consultation. - Follow CTA abdomen - Appreciate general surgery recommendations  Myasthenia gravis: Continue Mestinon .  Hold CellCept  for now.  Monitor NIF/VC as above.  PT/OT eval.  Moderate persistent asthma: No obvious wheezing on admission.  Continue Singulair  and albuterol  nebulizers as needed.  Hypertension: Continue losartan .  Thrombocytopenia: New mild thrombocytopenia.  CTA abdomen to evaluate spleen as above.  Will hold blood thinners.  Hypothyroidism: Continue Synthroid .  OSA: Continue CPAP nightly.  Depression: Continue Abilify  and Remeron .  Morbid obesity: Estimated body mass index is 65.84 kg/m  as calculated from the following:   Height as of 10/20/22: 5' 2 (1.575 m).   Weight as of 10/20/22: 163.3 kg.    DVT prophylaxis: SCDs Start: 08/18/23 2056 SCDs only for now given new thrombocytopenia, further evaluation of spleen Code Status: Full code, confirmed with patient on admission Family Communication: Sister at bedside Disposition Plan: From home, dispo pending clinical progress Consults called: General Surgery Severity of Illness: The appropriate patient status for this patient is INPATIENT. Inpatient status is judged to be reasonable and necessary in order to provide the required intensity of service to ensure the patient's safety. The patient's presenting symptoms, physical exam findings, and initial radiographic and laboratory data in the context of their chronic comorbidities is felt to place them at high risk for further clinical deterioration. Furthermore, it is not anticipated that the patient will be medically stable for discharge from the hospital within 2 midnights of admission.   * I certify that at the point of admission it is my clinical judgment that the patient will  require inpatient hospital care spanning beyond 2 midnights from the point of admission due to high intensity of service, high risk for further deterioration and high frequency of surveillance required.DEWAINE Jorie Blanch MD [facility]  If 7PM-7AM, please contact night-coverage [email or web address]  08/18/2023, 9:56 PM

## 2023-08-18 NOTE — ED Triage Notes (Signed)
 Pt BIB [facility] from home With bilateral leg weakness and Shortness of breath started 0900 today. EMS reported patient saturation when they picked her up was 88 %. She was placed on 4L Nasal cannula by EMS,  it increase to 94%. EMS try to pivot patient to the stretcher she got dizzy. Patient said she use 2 Liter of oxygen prn at home. Vital signs: BP 140/78, pulse 100, RR 22, saturation 93% 4L.

## 2023-08-18 NOTE — ED Notes (Signed)
 Lab called a critical Troponin 1 (130) ng/L at 1840 and Norleen Essex, PA was notified.

## 2023-08-18 NOTE — ED Notes (Signed)
 Patient transported to CT

## 2023-08-18 NOTE — Progress Notes (Signed)
 NIF greater than -40 great effort. FVC 520 ml great patient effort

## 2023-08-18 NOTE — Sepsis Progress Note (Signed)
 Notified provider of need to order repeat lactic acid.

## 2023-08-18 NOTE — ED Provider Notes (Signed)
 [facility] Provider Note   CSN: [id number] Arrival date & time: 08/18/23  1644     Patient presents with: [facility], Bilateral Leg weakness  HPI [name] is a 58 y.o. female with myasthenia gravis, history of PE, asthma, thyroid  disease presenting for shortness of breath.  She states shortness of breath started all of a sudden around 9 AM this morning also reports bilateral leg weakness.  Denies cough and fever and chest pain.  Also reports increased urinary frequency but no other urinary symptoms.  Denies nausea vomiting diarrhea or abdominal pain.  EMS was called and her initial sat was 88%.  She does have a 2 L oxygen requirement at home but they increased her to 4 L and her sats did improve to the mid 90s.  Endorse some lightheadedness when transferred to the stretcher by EMS.   HPI     Prior to Admission medications   Medication Sig Start Date End Date Taking? Authorizing Provider  acetaminophen  (TYLENOL ) 500 MG tablet Take 500 mg by mouth every 6 (six) hours as needed for mild pain.   Yes [provider]  albuterol  (PROVENTIL ) (2.5 MG/3ML) 0.083% nebulizer solution Take 2.5 mg by nebulization every 6 (six) hours as needed for wheezing or shortness of breath.   Yes [provider]  albuterol  (VENTOLIN  HFA) 108 (90 Base) MCG/ACT inhaler Inhale 2 puffs into the lungs every 4 (four) hours as needed for wheezing or shortness of breath.  05/28/16  Yes [provider]  ARIPiprazole  (ABILIFY ) 2 MG tablet Take 2 mg by mouth daily.   Yes [provider]  cyanocobalamin (VITAMIN B12) 1000 MCG tablet Take 1 tablet by mouth daily. 11/16/22 11/16/23 Yes [provider]  famotidine  (PEPCID ) 20 MG tablet Take 1 tablet (20 mg total) by mouth 2 (two) times daily. 03/09/18  Yes Henderly, Britni A, PA-C  ferrous sulfate 324 MG TBEC Take 324 mg by mouth daily with breakfast.   Yes [provider]   levothyroxine  (SYNTHROID ) 200 MCG tablet Take 200 mcg by mouth daily before breakfast. 11/16/22 11/16/23 Yes [provider]  losartan  (COZAAR ) 50 MG tablet Take 50 mg by mouth daily.   Yes [provider]  mirtazapine  (REMERON ) 15 MG tablet Take 15 mg by mouth at bedtime. 08/16/23 08/15/24 Yes [provider]  montelukast  (SINGULAIR ) 10 MG tablet Take 10 mg by mouth at bedtime.   Yes [provider]  mycophenolate  (CELLCEPT ) 500 MG tablet Take 1,000 mg by mouth 2 (two) times daily. 06/01/16  Yes [provider]  pregabalin  (LYRICA ) 200 MG capsule Take 200 mg by mouth 2 (two) times daily. 06/09/23 06/08/24 Yes [provider]  pyridostigmine  (MESTINON ) 60 MG tablet Take 30 mg by mouth 5 (five) times daily.  06/09/16  Yes [provider]  Pyridoxine HCl (VITAMIN B6 PO) Take 1,000 mcg by mouth daily.   Yes [provider]    Allergies: Patient has no known allergies.    Review of Systems See HPI   Physical Exam   Vitals:   08/18/23 1832 08/18/23 1832  BP:    Pulse:    Resp:    Temp: 99.7 F (37.6 C)   SpO2:  100%    CONSTITUTIONAL:  well-appearing, NAD NEURO:  Alert and oriented x 3, CN 3-12 grossly intact EYES:  eyes equal and reactive ENT/NECK:  Supple, no stridor  CARDIO:  tachycardia and regular rhythm, appears well-perfused, radial pulses are  2+ PULM: Intermittently tachypneic, on 4 L via nasal cannula, basilar breath sounds are diminished but otherwise CTAB GI/GU:  non-distended, soft, non tender MSK/SPINE:  No gross deformities, no edema, moves all extremities  SKIN:  no rash, atraumatic   *Additional and/or pertinent findings included in MDM below    (all labs ordered are listed, but only abnormal results are displayed) Labs Reviewed  COMPREHENSIVE METABOLIC PANEL WITH GFR - Abnormal; Notable for the following components:      Result Value   CO2 34 (*)    Glucose, Bld 109 (*)    Albumin 3.2 (*)     AST 59 (*)    All other components within normal limits  CBC WITH DIFFERENTIAL/PLATELET - Abnormal; Notable for the following components:   WBC 16.2 (*)    MCHC 29.9 (*)    RDW 15.8 (*)    Platelets 116 (*)    Neutro Abs 13.6 (*)    Abs Immature Granulocytes 0.15 (*)    All other components within normal limits  PROTIME-INR - Abnormal; Notable for the following components:   Prothrombin Time 16.2 (*)    All other components within normal limits  URINALYSIS, W/ REFLEX TO CULTURE (INFECTION SUSPECTED) - Abnormal; Notable for the following components:   APPearance HAZY (*)    Protein, ur 100 (*)    All other components within normal limits  BRAIN NATRIURETIC PEPTIDE - Abnormal; Notable for the following components:   B Natriuretic Peptide 105.0 (*)    All other components within normal limits  I-STAT CG4 LACTIC ACID, ED - Abnormal; Notable for the following components:   Lactic Acid, Venous 2.6 (*)    All other components within normal limits  I-STAT VENOUS BLOOD GAS, ED - Abnormal; Notable for the following components:   pCO2, Ven 85.6 (*)    Bicarbonate 41.2 (*)    TCO2 44 (*)    Acid-Base Excess 11.0 (*)    Potassium 5.3 (*)    All other components within normal limits  TROPONIN I (HIGH SENSITIVITY) - Abnormal; Notable for the following components:   Troponin I (High Sensitivity) 130 (*)    All other components within normal limits  TROPONIN I (HIGH SENSITIVITY) - Abnormal; Notable for the following components:   Troponin I (High Sensitivity) 123 (*)    All other components within normal limits  RESP PANEL BY RT-PCR (RSV, FLU A&B, COVID)  RVPGX2  CULTURE, BLOOD (ROUTINE X 2)  CULTURE, BLOOD (ROUTINE X 2)  HIV ANTIBODY (ROUTINE TESTING W REFLEX)  CBC  COMPREHENSIVE METABOLIC PANEL WITH GFR  PROTIME-INR  STREP PNEUMONIAE URINARY ANTIGEN  LEGIONELLA PNEUMOPHILA SEROGP 1 UR AG  HEMOGLOBIN AND HEMATOCRIT, BLOOD  HEMOGLOBIN AND HEMATOCRIT, BLOOD  I-STAT CG4 LACTIC ACID, ED   I-STAT CG4 LACTIC ACID, ED  I-STAT CG4 LACTIC ACID, ED    EKG: EKG Interpretation Date/Time:  Wednesday August 18 2023 16:51:38 EDT Ventricular Rate:  101 PR Interval:  151 QRS Duration:  115 QT Interval:  378 QTC Calculation: 490 R Axis:   14  Text Interpretation: Sinus tachycardia Nonspecific intraventricular conduction delay Low voltage, precordial leads Consider anterior infarct Confirmed by Yolande Charleston [phone]) on 08/18/2023 6:57:55 PM  Radiology: CT Angio Chest PE W/Cm &/Or Wo Cm Result Date: 08/18/2023 CLINICAL DATA:  Shortness of breath. EXAM: CT ANGIOGRAPHY CHEST WITH CONTRAST TECHNIQUE: Multidetector CT imaging of the chest was performed using the standard protocol during bolus administration of intravenous contrast. Multiplanar CT image reconstructions and MIPs were  obtained to evaluate the vascular anatomy. RADIATION DOSE REDUCTION: This exam was performed according to the departmental dose-optimization program which includes automated exposure control, adjustment of the mA and/or kV according to patient size and/or use of iterative reconstruction technique. CONTRAST:  75mL OMNIPAQUE  IOHEXOL  350 MG/ML SOLN COMPARISON:  December 21, 2018. FINDINGS: Cardiovascular: No evidence of large central pulmonary embolus seen in the main pulmonary artery or proximal portions of the left and right pulmonary arteries. However, lower lobe branches bilaterally are not well visualized due to surrounding atelectasis and limited opacification, and therefore smaller and more peripheral pulmonary emboli cannot be excluded on the basis of this exam. Mild cardiomegaly is noted. No pericardial effusion. Mediastinum/Nodes: No enlarged mediastinal, hilar, or axillary lymph nodes. Thyroid  gland, trachea, and esophagus demonstrate no significant findings. Lungs/Pleura: No pneumothorax or pleural effusion is noted. Bilateral posterior basilar atelectasis is noted. Upper Abdomen: No acute abnormality.  Musculoskeletal: No chest wall abnormality. No acute or significant osseous findings. Review of the MIP images confirms the above findings. IMPRESSION: No definite evidence of large central pulmonary embolus seen in main pulmonary artery or proximal portions of left and right pulmonary arteries. However, due to adjacent atelectasis and limited contrast opacification, lower lobe branches of both pulmonary arteries are not well visualized and therefore smaller and more peripheral pulmonary emboli cannot be excluded on the basis of this exam. Electronically Signed   By: Lynwood Landy Raddle M.D.   On: 08/18/2023 19:31   CT ABDOMEN PELVIS W CONTRAST Result Date: 08/18/2023 CLINICAL DATA:  Sepsis. EXAM: CT ABDOMEN AND PELVIS WITH CONTRAST TECHNIQUE: Multidetector CT imaging of the abdomen and pelvis was performed using the standard protocol following bolus administration of intravenous contrast. RADIATION DOSE REDUCTION: This exam was performed according to the departmental dose-optimization program which includes automated exposure control, adjustment of the mA and/or kV according to patient size and/or use of iterative reconstruction technique. CONTRAST:  75mL OMNIPAQUE  IOHEXOL  350 MG/ML SOLN COMPARISON:  CT abdomen and pelvis 03/09/2018 FINDINGS: Lower chest: There are mild patchy airspace opacities in both lower lobes, left greater than right. Hepatobiliary: The liver is mildly enlarged. No focal liver lesions are seen. Gallbladder and bile ducts are within normal limits. Pancreas: Unremarkable. No pancreatic ductal dilatation or surrounding inflammatory changes. Spleen: The spleen appears heterogeneously enhancing which may be related to phase of contrast. Splenic laceration and subcapsular fluid collections are a possibility as well. Overall measurements of the spleen are slightly larger when compared to 2020. There is no surrounding inflammatory stranding. Adrenals/Urinary Tract: Bilateral renal cysts are present  measuring up to 3.4 cm on the right. There is no hydronephrosis. The adrenal glands and bladder are within normal limits. Stomach/Bowel: Stomach is within normal limits. Appendix appears normal. No evidence of bowel wall thickening, distention, or inflammatory changes. There is sigmoid colon diverticulosis. Vascular/Lymphatic: Aortic atherosclerosis. No enlarged abdominal or pelvic lymph nodes. Reproductive: Uterus is enlarged with multiple fibroids. Largest fibroid is on the right measuring 6.8 cm. Adnexa are within normal limits. Other: There is mild diffuse body wall edema. There is no focal abdominal wall hernia. There is trace fluid in the left upper quadrant. Musculoskeletal: No acute or significant osseous findings. IMPRESSION: 1. Indeterminate heterogeneous enhancement of the spleen with mild enlargement when compared to prior. Splenic laceration and subcapsular fluid collections are a possibility as well as infection. 2. Trace fluid in the left upper quadrant. 3. Mild patchy airspace opacities in both lower lobes, left greater than right, worrisome for pneumonia. 4.  Mild hepatomegaly. 5. Uterine fibroids. 6. Aortic atherosclerosis. Aortic Atherosclerosis (ICD10-I70.0). Electronically Signed   By: Greig Pique M.D.   On: 08/18/2023 19:31   DG Chest Port 1 View Result Date: 08/18/2023 CLINICAL DATA:  Question of sepsis to evaluate for abnormality. Shortness of breath and bilateral leg weakness. EXAM: PORTABLE CHEST 1 VIEW COMPARISON:  12/21/2018 FINDINGS: Postoperative changes in the mediastinum. Shallow inspiration. Cardiac enlargement with mild central vascular congestion. Interstitial changes in the lungs likely represent mild interstitial edema. Linear scarring or atelectasis in the lung bases. No pleural effusion pneumothorax. Mediastinal contours appear intact. IMPRESSION: Cardiac enlargement with developing pulmonary vascular congestion and suggestion of mild interstitial edema. Electronically  Signed   By: Elsie Gravely M.D.   On: 08/18/2023 17:58     .Critical Care  Performed by: Lang Norleen POUR, PA-C Authorized by: Lang Norleen POUR, PA-C   Critical care provider statement:    Critical care time (minutes):  30   Critical care was necessary to treat or prevent imminent or life-threatening deterioration of the following conditions:  Sepsis (Likely secondary to pneumonia)   Critical care was time spent personally by me on the following activities:  Development of treatment plan with patient or surrogate, discussions with consultants, evaluation of patient's response to treatment, examination of patient, ordering and review of laboratory studies, ordering and review of radiographic studies, ordering and performing treatments and interventions, pulse oximetry, re-evaluation of patient's condition and review of old charts    Medications Ordered in the ED  sodium chloride  flush (NS) 0.9 % injection 3 mL (has no administration in time range)  acetaminophen  (TYLENOL ) tablet 650 mg (has no administration in time range)    Or  acetaminophen  (TYLENOL ) suppository 650 mg (has no administration in time range)  lactated ringers  infusion (has no administration in time range)  ondansetron  (ZOFRAN ) tablet 4 mg (has no administration in time range)    Or  ondansetron  (ZOFRAN ) injection 4 mg (has no administration in time range)  senna-docusate (Senokot-S) tablet 1 tablet (has no administration in time range)  guaiFENesin  (MUCINEX ) 12 hr tablet 600 mg (has no administration in time range)  cefTRIAXone  (ROCEPHIN ) 2 g in sodium chloride  0.9 % 100 mL IVPB (has no administration in time range)  losartan  (COZAAR ) tablet 50 mg (has no administration in time range)  ARIPiprazole  (ABILIFY ) tablet 2 mg (has no administration in time range)  mirtazapine  (REMERON ) tablet 15 mg (has no administration in time range)  levothyroxine  (SYNTHROID ) tablet 200 mcg (has no administration in time range)   famotidine  (PEPCID ) tablet 20 mg (has no administration in time range)  pregabalin  (LYRICA ) capsule 200 mg (has no administration in time range)  pyridostigmine  (MESTINON ) tablet 30 mg (has no administration in time range)  albuterol  (PROVENTIL ) (2.5 MG/3ML) 0.083% nebulizer solution 2.5 mg (has no administration in time range)  montelukast  (SINGULAIR ) tablet 10 mg (has no administration in time range)  doxycycline  (VIBRA -TABS) tablet 100 mg (has no administration in time range)  lactated ringers  bolus 1,000 mL (0 mLs Intravenous Stopped 08/18/23 1907)  cefTRIAXone  (ROCEPHIN ) 2 g in sodium chloride  0.9 % 100 mL IVPB (0 g Intravenous Stopped 08/18/23 1841)  iohexol  (OMNIPAQUE ) 350 MG/ML injection 75 mL (75 mLs Intravenous Contrast Given 08/18/23 1922)                                    Medical Decision Making Amount and/or Complexity of  Data Reviewed Labs: ordered. Radiology: ordered.  Risk Prescription drug management. Decision regarding hospitalization.   Initial Impression and Ddx 58 year old female in mild respiratory distress presenting for shortness of breath.  Exam notable for tachypnea on 4 L of oxygen as well as diminished breath sounds.  Initial temperature was 100.3 F, she was tachycardic and tachypneic as well.  Activated code sepsis.  DDx includes pneumonia, sepsis, ACS, PE, COPD exacerbation, CHF exacerbation, UTI, intra-abdominal infection, other. Patient PMH that increases complexity of ED encounter:  myasthenia gravis, history of PE, asthma, thyroid  disease   Interpretation of Diagnostics - I independent reviewed and interpreted the labs as followed: Elevated initial troponin, leukocytosis, elevated BNP, pCO2 is 85  - I independently visualized the following imaging with scope of interpretation limited to determining acute life threatening conditions related to emergency care: CT angio chest negative for saddle or segmental PE but could not definitively rule out more  distal PE bilaterally.  CT abdomen pelvis revealed concern for pneumonia, questionable findings, see details above.  Shared these findings with patient.  -I personally reviewed interpreted EKG which revealed sinus tachycardia  Patient Reassessment and Ultimate Disposition/Management Overall workup suggestive of sepsis secondary to pneumonia. Discussed nonspecific findings on CT with Dr. Tobie the hospitalist. At this time mutually agreed to admit her for sepsis and pneumonia.  Also discussed antibiotics with pharmacy and stated that ceftriaxone  and doxycycline  would be appropriate in the setting of her myasthenia gravis.  At this time she remains stable on 4 L.  Distress is likely secondary to ongoing pneumonia but cannot definitively rule rule out progression of her MG.  Patient management required discussion with the following services or consulting groups:  Hospitalist Service  Complexity of Problems Addressed Acute complicated illness or Injury  Additional Data Reviewed and Analyzed Further history obtained from: Past medical history and medications listed in the EMR and Prior ED visit notes  Patient Encounter Risk Assessment Consideration of hospitalization      Final diagnoses:  Shortness of breath    ED Discharge Orders     None          Lang Norleen POUR, PA-C 08/18/23 2209    Yolande Lamar BROCKS, MD 08/19/23 0002

## 2023-08-18 NOTE — Hospital Course (Addendum)
 [name] is a 58 y.o. 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  On the AM of 08/25/23 Patient decompensated and became more encephlopathic and likely 2/2 to Myasthenia Gravis Flare. Neuro urgently consulted and ABG checked and patient was hypercarbic. Placed on BiPAP and PCCM consulted and she was transferred to the ICU and then subsequently intubated.  She was stabilized and subsequently weaned off the ventilator and extubated on 08/29/2023.  Neurology recommended continuing IVIG for 5 days total.  Subsequently she was transferred back to the [facility] service on 08/31/2023 and PT OT recommending SNF and she is agreeable now.  Assessment and Plan:  Myasthenia gravis exacerbation with associated respiratory muscle weakness.  She is status post  5 days of IVIG; Continue her mycophenolate  1000 mg p.o. twice daily and Pyridostigmine  30 mg p.o. 5 times daily.  Follow her neurological exam, NIF, FVC.  She will need to follow-up with neurology in outpatient setting and neurology offered her PLEX treatment however patient would like a trial of PT/OT.  Neurology has now signed off the case.   Acute on chronic hypoxemic and hypercapnic respiratory failure due to restrictive disease from the above as well as history of OSA/OHS (on CPAP), asthma.  Earlier on admission she was thought to have sepsis secondary to bibasilar pneumonia and was treated with antibiotics.  She completed antibiotic therapy and she is now afebrile and has no leukocytosis.  Subsequently she ended up having a myasthenia gravis flare and had to be intubated and transferred to the intensive care unit.  Was extubated on  08/29/2023.  Continuing BiPAP support intermittently and she to wear it mandatory nightly given her history of OSA/OHS.  She has bibasilar atelectasis versus infiltrate, pushing pulmonary hygiene and holding off on antibiotics for now.  Repeat chest x-ray in the a.m. and continue with budesonide  0.5 mg p.o. twice daily, albuterol  neb 2.5 mg every 6 as needed, continue with montelukast  10 mg p.o. daily and continue pulmonary hygiene.   Hypernatremia and Hypokalemia.  Resolved.    Essential Hypertension:  Her home Losartan  50 mg po Daily is on hold.  Will restart in the a.m. CTM BP per Protocol. Last BP reading was 123/47   Hypothyroidism: TSH was 3.286. C/w Levothyroxine  200 mcg po Daily   Depression and Anxiety: Continue Aripiprazole  2 mg po Daily and c/w Pregabalin  200 mg po BID; Mirtazapine  15 mg po at bedtime still held     Nontraumatic splenic laceration with hematoma, unclear cause.  Patient stated that she fell in the restroom about 2 or 3 months ago.  CT abdomen pelvis showed indeterminate heterogeneous enhancement of the spleen with mild enlargement when compared to prior and splenic laceration and subscapular fluid collection or possibly as well as infection.  Repeat CTA showed markedly abnormal skin with splenic lacerations at the mid pole and subscapular hematoma.  Surgery evaluation appreciated and they initially recommended Supportive care and signed off the case 8/5.  We would reimage if any evidence of progressive anemia, pain.  Hemoglobin/hematocrit trend as below and will avoid anticoagulants but if she does decompensate may need iron involvement.   Normocytic Anemia: Hgb/Hct Trend:  Recent Labs  Lab 08/25/23 1349 08/26/23 0402 08/27/23 0355 08/28/23 0345  08/29/23 0549 08/30/23 0500 08/31/23 1009  HGB 11.6* 9.8* 10.0* 9.9* 9.9* 10.2* 10.7*  HCT 34.0* 32.9* 32.9* 32.1* 31.5* 33.1* 35.6*  MCV  --  100.9* 97.1 95.5 92.9 95.4 96.5  -Check Anemia Panel int he AM. CTM for S/Sx of  Bleeding; No overt bleeding noted. Repeat CBC in the AM  Hypoalbuminemia: Patient's Albumin Lvl went from 3.2 -> 3.0 -> 2.9 -> 2.6. CTM and Trend and repeat CMP in the AM  OSA: Continue with CPAP nightly  Class III (Super Morbid) Obesity: Complicates overall prognosis and care. Estimated body mass index is 53.3 kg/m as calculated from the following:   Height as of this encounter: 5' (1.524 m).   Weight as of this encounter: 123.8 kg. Weight Loss and Dietary Counseling given.  Her Wegovy is on hold.  Will need to reassess as an outpatient the timing of restarting based on recovery from this acute illness.

## 2023-08-19 ENCOUNTER — Other Ambulatory Visit: Payer: Self-pay

## 2023-08-19 DIAGNOSIS — J189 Pneumonia, unspecified organism: Secondary | ICD-10-CM | POA: Diagnosis not present

## 2023-08-19 DIAGNOSIS — A419 Sepsis, unspecified organism: Secondary | ICD-10-CM | POA: Diagnosis not present

## 2023-08-19 LAB — CBC
HCT: 40.2 % (ref 36.0–46.0)
Hemoglobin: 12.1 g/dL (ref 12.0–15.0)
MCH: 29.6 pg (ref 26.0–34.0)
MCHC: 30.1 g/dL (ref 30.0–36.0)
MCV: 98.3 fL (ref 80.0–100.0)
Platelets: 106 K/uL — ABNORMAL LOW (ref 150–400)
RBC: 4.09 MIL/uL (ref 3.87–5.11)
RDW: 15.5 % (ref 11.5–15.5)
WBC: 14.8 K/uL — ABNORMAL HIGH (ref 4.0–10.5)
nRBC: 0.2 % (ref 0.0–0.2)

## 2023-08-19 LAB — PROTIME-INR
INR: 1.2 (ref 0.8–1.2)
Prothrombin Time: 16.2 s — ABNORMAL HIGH (ref 11.4–15.2)

## 2023-08-19 LAB — BLOOD GAS, VENOUS
Acid-Base Excess: 11.2 mmol/L — ABNORMAL HIGH (ref 0.0–2.0)
Bicarbonate: 37 mmol/L — ABNORMAL HIGH (ref 20.0–28.0)
O2 Saturation: 99.5 %
Patient temperature: 37.1
pCO2, Ven: 52 mmHg (ref 44–60)
pH, Ven: 7.46 — ABNORMAL HIGH (ref 7.25–7.43)
pO2, Ven: 92 mmHg — ABNORMAL HIGH (ref 32–45)

## 2023-08-19 LAB — BLOOD GAS, ARTERIAL
Acid-Base Excess: 11.1 mmol/L — ABNORMAL HIGH (ref 0.0–2.0)
Bicarbonate: 40.7 mmol/L — ABNORMAL HIGH (ref 20.0–28.0)
O2 Saturation: 98 %
Patient temperature: 36.8
pCO2 arterial: 78 mmHg (ref 32–48)
pH, Arterial: 7.32 — ABNORMAL LOW (ref 7.35–7.45)
pO2, Arterial: 80 mmHg — ABNORMAL LOW (ref 83–108)

## 2023-08-19 LAB — HEMOGLOBIN AND HEMATOCRIT, BLOOD
HCT: 41.4 % (ref 36.0–46.0)
HCT: 41.3 % (ref 36.0–46.0)
HCT: 43.9 % (ref 36.0–46.0)
Hemoglobin: 12.7 g/dL (ref 12.0–15.0)
Hemoglobin: 12.5 g/dL (ref 12.0–15.0)
Hemoglobin: 13.2 g/dL (ref 12.0–15.0)

## 2023-08-19 LAB — COMPREHENSIVE METABOLIC PANEL WITH GFR
ALT: 13 U/L (ref 0–44)
AST: 43 U/L — ABNORMAL HIGH (ref 15–41)
Albumin: 3 g/dL — ABNORMAL LOW (ref 3.5–5.0)
Alkaline Phosphatase: 50 U/L (ref 38–126)
Anion gap: 8 (ref 5–15)
BUN: 5 mg/dL — ABNORMAL LOW (ref 6–20)
CO2: 32 mmol/L (ref 22–32)
Calcium: 8.8 mg/dL — ABNORMAL LOW (ref 8.9–10.3)
Chloride: 99 mmol/L (ref 98–111)
Creatinine, Ser: 0.55 mg/dL (ref 0.44–1.00)
GFR, Estimated: >60 mL/min (ref >60)
Glucose, Bld: 120 mg/dL — ABNORMAL HIGH (ref 70–99)
Potassium: 4.8 mmol/L (ref 3.5–5.1)
Sodium: 139 mmol/L (ref 135–145)
Total Bilirubin: 1 mg/dL (ref 0.0–1.2)
Total Protein: 6.9 g/dL (ref 6.5–8.1)

## 2023-08-19 LAB — HIV ANTIBODY (ROUTINE TESTING W REFLEX): HIV Screen 4th Generation wRfx: NONREACTIVE

## 2023-08-19 LAB — STREP PNEUMONIAE URINARY ANTIGEN: Strep Pneumo Urinary Antigen: NEGATIVE

## 2023-08-19 LAB — MRSA NEXT GEN BY PCR, NASAL: MRSA by PCR Next Gen: NOT DETECTED

## 2023-08-19 LAB — I-STAT CG4 LACTIC ACID, ED: Lactic Acid, Venous: 0.5 mmol/L (ref 0.5–1.9)

## 2023-08-19 MED ORDER — ACETAMINOPHEN 325 MG PO TABS: 650.0000 mg | ORAL_TABLET | Freq: Once | ORAL | Status: DC

## 2023-08-19 MED ORDER — PROCHLORPERAZINE EDISYLATE 10 MG/2ML IJ SOLN
10.0000 mg | INTRAMUSCULAR | Status: AC
  Administered 2023-08-19: 10 mg via INTRAVENOUS
  Filled 2023-08-19: qty 2

## 2023-08-19 MED ORDER — BUDESONIDE 0.25 MG/2ML IN SUSP
0.2500 mg | Freq: Two times a day (BID) | RESPIRATORY_TRACT | Status: DC
  Administered 2023-08-19 – 2023-08-25 (×12): 0.25 mg via RESPIRATORY_TRACT
  Filled 2023-08-19 (×12): qty 2

## 2023-08-19 MED ORDER — IPRATROPIUM-ALBUTEROL 0.5-2.5 (3) MG/3ML IN SOLN
3.0000 mL | Freq: Three times a day (TID) | RESPIRATORY_TRACT | Status: DC
  Administered 2023-08-19 – 2023-08-21 (×5): 3 mL via RESPIRATORY_TRACT
  Filled 2023-08-19 (×5): qty 3

## 2023-08-19 NOTE — ED Notes (Signed)
 Pt removed from CPAP and back to [place] on 3L

## 2023-08-19 NOTE — Progress Notes (Addendum)
 Subjective: CC: Patient denies nausea but endorsed having one episode of emesis this AM. Denies fever/chill or bloody stools. She denies any blood in her vomit from this AM. Patient continues to have abdominal pain that she describes as all over her belly. She denies any recent trauma. She endorses a cough and is currently on 3L [place]. She wears CPAP at night for OSA.  Objective: Vital signs in last 24 hours: Temp:  [98.2 F (36.8 C)-101 F (38.3 C)] 98.2 F (36.8 C) (07/31 0903) Pulse Rate:  [93-102] 93 (07/31 0903) Resp:  [16-33] 23 (07/31 0903) BP: (103-152)/(57-80) 108/77 (07/31 0903) SpO2:  [92 %-100 %] 92 % (07/31 0903)    Intake/Output from previous day: 07/30 0701 - 07/31 0700 In: -  Out: 1100 [Urine:800; Emesis/NG output:300] Intake/Output this shift: No intake/output data recorded.  PE:  Constitutional:      General: She is not in acute distress.    Appearance: She is obese. She is ill-appearing.  Cardiovascular:     Rate and Rhythm: Normal rate.  Pulmonary:     Effort: No respiratory distress.     Comments: Productive cough present. Patient on 3L [place].  Abdominal:     General: Abdomen is protuberant. There is distension.     Palpations: Abdomen is soft.     Tenderness: There is generalized abdominal tenderness. There is no guarding or rebound.     Comments: No obvious peritonitis noted on exam.   Skin:    General: Skin is warm and dry.  Neurological:     General: No focal deficit present.     Mental Status: She is alert and oriented to person, place, and time.   Lab Results:  Recent Labs    08/18/23 1748 08/18/23 1905 08/19/23 0212  WBC 16.2*  --  14.8*  HGB 12.6 14.3 12.1  HCT 42.2 42.0 40.2  PLT 116*  --  106*   BMET Recent Labs    08/18/23 1748 08/18/23 1905 08/19/23 0212  NA 140 139 139  K 5.1 5.3* 4.8  CL 99  --  99  CO2 34*  --  32  GLUCOSE 109*  --  120*  BUN 7  --  5*  CREATININE 0.84  --  0.55  CALCIUM 9.0  --  8.8*    PT/INR Recent Labs    08/18/23 1748 08/19/23 0212  LABPROT 16.2* 16.2*  INR 1.2 1.2   CMP     Component Value Date/Time   NA 139 08/19/2023 0212   K 4.8 08/19/2023 0212   CL 99 08/19/2023 0212   CO2 32 08/19/2023 0212   GLUCOSE 120 (H) 08/19/2023 0212   BUN 5 (L) 08/19/2023 0212   CREATININE 0.55 08/19/2023 0212   CALCIUM 8.8 (L) 08/19/2023 0212   PROT 6.9 08/19/2023 0212   ALBUMIN 3.0 (L) 08/19/2023 0212   AST 43 (H) 08/19/2023 0212   ALT 13 08/19/2023 0212   ALKPHOS 50 08/19/2023 0212   BILITOT 1.0 08/19/2023 0212   GFRNONAA >60 08/19/2023 0212   GFRAA >60 12/21/2018 1236   Lipase     Component Value Date/Time   LIPASE 25 09/09/2018 1416    Studies/Results: CT Angio Abd/Pel w/ and/or w/o Result Date: 08/18/2023 CLINICAL DATA:  Abnormal spleen EXAM: CTA ABDOMEN AND PELVIS WITHOUT AND WITH CONTRAST TECHNIQUE: Multidetector CT imaging of the abdomen and pelvis was performed using the standard protocol during bolus administration of intravenous contrast. Multiplanar reconstructed images and MIPs  were obtained and reviewed to evaluate the vascular anatomy. RADIATION DOSE REDUCTION: This exam was performed according to the departmental dose-optimization program which includes automated exposure control, adjustment of the mA and/or kV according to patient size and/or use of iterative reconstruction technique. CONTRAST:  OMNIPAQUE  IOHEXOL  350 MG/ML SOLN COMPARISON:  CT earlier today. FINDINGS: VASCULAR Aorta: Normal caliber aorta without aneurysm, dissection, vasculitis or significant stenosis. Celiac: Patent without evidence of aneurysm, dissection, vasculitis or significant stenosis. SMA: Patent without evidence of aneurysm, dissection, vasculitis or significant stenosis. Renals: Both renal arteries are patent without evidence of aneurysm, dissection, vasculitis, fibromuscular dysplasia or significant stenosis. IMA: Patent without evidence of aneurysm, dissection, vasculitis  or significant stenosis. Inflow: Patent without evidence of aneurysm, dissection, vasculitis or significant stenosis. Proximal Outflow: Bilateral common femoral and visualized portions of the superficial and profunda femoral arteries are patent without evidence of aneurysm, dissection, vasculitis or significant stenosis. Veins: No obvious venous abnormality within the limitations of this arterial phase study. Review of the MIP images confirms the above findings. NON-VASCULAR Lower chest: Bilateral lower lobe airspace opacities, left greater than right. No effusions. Hepatobiliary: Liver is enlarged. No focal liver lesion. Gallbladder unremarkable. No biliary ductal dilatation. Pancreas: No focal abnormality or ductal dilatation. Spleen: Markedly abnormal spleen. There appears to be splenic lacerations in the midpole region with surrounding subcapsular/perisplenic hematoma. No active extravasation. Adrenals/Urinary Tract: Adrenal glands normal. Multiple small bilateral renal cysts appear benign. No follow-up imaging recommended. No hydronephrosis. Urinary bladder unremarkable. Stomach/Bowel: Sigmoid diverticulosis. No active diverticulitis. Stomach and small bowel decompressed, unremarkable. Lymphatic: No adenopathy Reproductive: Multiple uterine fibroids.  No adnexal mass. Other: No free fluid or free air. Musculoskeletal: No acute bony abnormality. IMPRESSION: Markedly abnormal appearance of the spleen with apparent splenic lacerations in the midpole region. Surrounding subcapsular hematoma. No free fluid in the abdomen or pelvis. No active extravasation of contrast. Hepatomegaly. Bilateral lower lobe airspace opacities, left greater than right concerning for pneumonia. Fibroid uterus. Electronically Signed   By: Franky Crease M.D.   On: 08/18/2023 22:23   CT Angio Chest PE W/Cm &/Or Wo Cm Result Date: 08/18/2023 CLINICAL DATA:  Shortness of breath. EXAM: CT ANGIOGRAPHY CHEST WITH CONTRAST TECHNIQUE:  Multidetector CT imaging of the chest was performed using the standard protocol during bolus administration of intravenous contrast. Multiplanar CT image reconstructions and MIPs were obtained to evaluate the vascular anatomy. RADIATION DOSE REDUCTION: This exam was performed according to the departmental dose-optimization program which includes automated exposure control, adjustment of the mA and/or kV according to patient size and/or use of iterative reconstruction technique. CONTRAST:  75mL OMNIPAQUE  IOHEXOL  350 MG/ML SOLN COMPARISON:  December 21, 2018. FINDINGS: Cardiovascular: No evidence of large central pulmonary embolus seen in the main pulmonary artery or proximal portions of the left and right pulmonary arteries. However, lower lobe branches bilaterally are not well visualized due to surrounding atelectasis and limited opacification, and therefore smaller and more peripheral pulmonary emboli cannot be excluded on the basis of this exam. Mild cardiomegaly is noted. No pericardial effusion. Mediastinum/Nodes: No enlarged mediastinal, hilar, or axillary lymph nodes. Thyroid  gland, trachea, and esophagus demonstrate no significant findings. Lungs/Pleura: No pneumothorax or pleural effusion is noted. Bilateral posterior basilar atelectasis is noted. Upper Abdomen: No acute abnormality. Musculoskeletal: No chest wall abnormality. No acute or significant osseous findings. Review of the MIP images confirms the above findings. IMPRESSION: No definite evidence of large central pulmonary embolus seen in main pulmonary artery or proximal portions of left and right pulmonary  arteries. However, due to adjacent atelectasis and limited contrast opacification, lower lobe branches of both pulmonary arteries are not well visualized and therefore smaller and more peripheral pulmonary emboli cannot be excluded on the basis of this exam. Electronically Signed   By: Lynwood Landy Raddle M.D.   On: 08/18/2023 19:31   CT ABDOMEN  PELVIS W CONTRAST Result Date: 08/18/2023 CLINICAL DATA:  Sepsis. EXAM: CT ABDOMEN AND PELVIS WITH CONTRAST TECHNIQUE: Multidetector CT imaging of the abdomen and pelvis was performed using the standard protocol following bolus administration of intravenous contrast. RADIATION DOSE REDUCTION: This exam was performed according to the departmental dose-optimization program which includes automated exposure control, adjustment of the mA and/or kV according to patient size and/or use of iterative reconstruction technique. CONTRAST:  75mL OMNIPAQUE  IOHEXOL  350 MG/ML SOLN COMPARISON:  CT abdomen and pelvis 03/09/2018 FINDINGS: Lower chest: There are mild patchy airspace opacities in both lower lobes, left greater than right. Hepatobiliary: The liver is mildly enlarged. No focal liver lesions are seen. Gallbladder and bile ducts are within normal limits. Pancreas: Unremarkable. No pancreatic ductal dilatation or surrounding inflammatory changes. Spleen: The spleen appears heterogeneously enhancing which may be related to phase of contrast. Splenic laceration and subcapsular fluid collections are a possibility as well. Overall measurements of the spleen are slightly larger when compared to 2020. There is no surrounding inflammatory stranding. Adrenals/Urinary Tract: Bilateral renal cysts are present measuring up to 3.4 cm on the right. There is no hydronephrosis. The adrenal glands and bladder are within normal limits. Stomach/Bowel: Stomach is within normal limits. Appendix appears normal. No evidence of bowel wall thickening, distention, or inflammatory changes. There is sigmoid colon diverticulosis. Vascular/Lymphatic: Aortic atherosclerosis. No enlarged abdominal or pelvic lymph nodes. Reproductive: Uterus is enlarged with multiple fibroids. Largest fibroid is on the right measuring 6.8 cm. Adnexa are within normal limits. Other: There is mild diffuse body wall edema. There is no focal abdominal wall hernia. There is  trace fluid in the left upper quadrant. Musculoskeletal: No acute or significant osseous findings. IMPRESSION: 1. Indeterminate heterogeneous enhancement of the spleen with mild enlargement when compared to prior. Splenic laceration and subcapsular fluid collections are a possibility as well as infection. 2. Trace fluid in the left upper quadrant. 3. Mild patchy airspace opacities in both lower lobes, left greater than right, worrisome for pneumonia. 4. Mild hepatomegaly. 5. Uterine fibroids. 6. Aortic atherosclerosis. Aortic Atherosclerosis (ICD10-I70.0). Electronically Signed   By: Greig Pique M.D.   On: 08/18/2023 19:31   DG Chest Port 1 View Result Date: 08/18/2023 CLINICAL DATA:  Question of sepsis to evaluate for abnormality. Shortness of breath and bilateral leg weakness. EXAM: PORTABLE CHEST 1 VIEW COMPARISON:  12/21/2018 FINDINGS: Postoperative changes in the mediastinum. Shallow inspiration. Cardiac enlargement with mild central vascular congestion. Interstitial changes in the lungs likely represent mild interstitial edema. Linear scarring or atelectasis in the lung bases. No pleural effusion pneumothorax. Mediastinal contours appear intact. IMPRESSION: Cardiac enlargement with developing pulmonary vascular congestion and suggestion of mild interstitial edema. Electronically Signed   By: Elsie Gravely M.D.   On: 08/18/2023 17:58    Anti-infectives: Anti-infectives (From admission, onward)    Start     Dose/Rate Route Frequency Ordered Stop   08/19/23 1800  cefTRIAXone  (ROCEPHIN ) 2 g in sodium chloride  0.9 % 100 mL IVPB        2 g 200 mL/hr over 30 Minutes Intravenous Every 24 hours 08/18/23 2101     08/18/23 2200  doxycycline  (VIBRA -TABS) tablet  100 mg        100 mg Oral Every 12 hours 08/18/23 2156     08/18/23 1730  cefTRIAXone  (ROCEPHIN ) 2 g in sodium chloride  0.9 % 100 mL IVPB        2 g 200 mL/hr over 30 Minutes Intravenous Once 08/18/23 1726 08/18/23 1841         Assessment/Plan Splenic Laceration    - CT Angio of abd/pelvis shows markedly abnormal appearance of the spleen with apparent splenic lacerations in the midpole region. Surrounding subcapsular hematoma. No free fluid in the abdomen or pelvis. No active extravasation of contrast.  - Patient denied any kind of trauma recently.  - Continue observation and monitoring - Q6 H&H - Stable at the moment with hgb at 12.1 and HCT at 40.2. - Continue abx  FEN - NPO VTE - SCD's  ID - Rocephin  and doxycycline    PMHx- -Sleep apnea- On nocturnal CPAP. -PE but off blood thinners -Myasthenia Gravis   I reviewed nursing notes, ED provider notes, hospitalist notes, last 24 h vitals and pain scores, last 48 h intake and output, last 24 h labs and trends, and last 24 h imaging results.    LOS: 1 day    Eulah Hammonds, [facility] 08/19/2023, 10:38 AM Please see Amion for pager number during day hours 7:00am-4:30pm

## 2023-08-19 NOTE — Progress Notes (Signed)
 NIF and VC as follows: NIF -25cmH2O with great effort. FVC 690 mL with great effort.  Pt able to initiate good breaths and is in no respiratory distress at this time.

## 2023-08-19 NOTE — Progress Notes (Signed)
 Progress Note   Patient: [name] FMW:[id number] DOB: Jan 06, 1966 DOA: 08/18/2023     1 DOS: the patient was seen and examined on 08/19/2023   Brief hospital course:  58 y.o. 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   Assessment and Plan:  Sepsis due to bilateral lower lobe possible bacterial pneumonia: Presenting with leukocytosis, tachycardia, tachypnea.  Imaging suspicious for bilateral lobe pneumonia.  COVID, influenza, RSV negative.  Lactic acidosis has resolved. - Continue ceftriaxone  and doxycycline  - Received IVF - Follow blood cultures-NGTD  - f/u strep pneumonia and Legionella urine antigens - Continue supplemental O2 as needed and wean to home 2 L [place] as able   Acute on chronic hypoxic and hypercapnic respiratory failure: In setting of pneumonia as above.  Patient uses 2 L O2 [place] at baseline, requiring 4 L O2  on admission.  With history of myasthenia gravis, she is at increased risk for respiratory decompensation. - Continue supplemental O2 - NIF/VC q12h checks - Continue CPAP nightly   Elevated troponin/Type 2 MI: Mild troponin elevation, downtrending on repeat.  Patient denies chest pain.  EKG without significant ST changes.  Likely demand ischemia in setting of sepsis and hypoxic respiratory failure.  Monitor on telemetry.   Splenic laceration and subcapsular hematoma: Non-traumatic CT A/P showed indeterminate heterogeneous enhancement of the spleen with mild enlargement when compared to prior.  Splenic laceration and subcapsular fluid collections are possibility as well as infection.  - CTA abdomen showed markedly abnormal spleen with splenic lacerations at mid pole and subcapsular  hematoma - Appreciate general surgery recommendations -f/u H&H closely and avoid any anticoagulants.   Myasthenia gravis: Continue Mestinon .  Hold CellCept  for now.  Monitor NIF/VC as above.  PT/OT eval.   Moderate persistent asthma: No obvious wheezing on admission.  Continue Singulair  and albuterol  nebulizers as needed.   Hypertension: Continue losartan . Prn antihypertensives   Thrombocytopenia: New mild thrombocytopenia.  CTA abdomen to evaluate spleen as above.  Will hold blood thinners.   Hypothyroidism: Continue Synthroid .   OSA: Continue CPAP nightly.   Major moderate Depression: Continue Abilify  and Remeron .   Class III obesity: Estimated body mass index is 65.84 kg/m as calculated from the following:   Height as of 10/20/22: 5' 2 (1.575 m).   Weight as of 10/20/22: 163.3 kg.      Subjective: She is nauseous and actively vomiting. Family members at bedside. She said she started having shortness of breath yesterday. She uses oxygen as needed at home.   Physical Exam: Vitals:   08/19/23 0415 08/19/23 0600 08/19/23 0903 08/19/23 1236  BP:  129/68 108/77 (!) 140/70  Pulse: 97 98 93 80  Resp: 16 (!) 21 (!) 23 (!) 26  Temp: 99.7 F (37.6 C)  98.2 F (36.8 C) 98.3 F (36.8 C)  TempSrc: Rectal  Oral Oral  SpO2: 98% 94% 92% 100%   Constitutional: Mild to moderate distress, nauseous, nasal oxygen cannula in place Eyes: PERRL, lids and conjunctivae normal ENMT: Mucous membranes are moist. Posterior pharynx clear of any exudate or lesions.Normal dentition.  Neck: normal, supple, no masses, no thyromegaly Respiratory: coarse breath sounds bilaterally Cardiovascular: Regular rate and rhythm, no murmurs / rubs / gallops. No extremity edema. 2+ pedal pulses. No carotid bruits.  Abdomen: tenderness to palpation diffusely, no masses palpated. No hepatosplenomegaly. Bowel sounds positive.  Musculoskeletal: no clubbing / cyanosis. No joint deformity upper and lower  extremities. Good ROM, no contractures. Normal muscle tone.  Skin: no rashes, lesions, ulcers. No induration Neurologic: CN 2-12 grossly intact. Sensation intact, DTR normal. Strength 5/5 x all 4 extremities.  Psychiatric: Normal judgment and insight. Alert and oriented x 3. Normal mood.   Data Reviewed:  There are no new results to review at this time.  Family Communication: Family at the bedside  Disposition: Status is: Inpatient Remains inpatient appropriate because: Splenic laceration, ARF  Planned Discharge Destination: Home with Home Health    Time spent: 43 minutes  Author: Deliliah Room, MD 08/19/2023 12:59 PM  For on call review [email or web address].

## 2023-08-19 NOTE — ED Notes (Addendum)
 [name]

## 2023-08-19 NOTE — ED Notes (Signed)
 PT at bedside.

## 2023-08-19 NOTE — ED Notes (Signed)
 Patient reports increased nausea again. Will give PRN Zofran  per MD order. Patient updated on plan.

## 2023-08-19 NOTE — Evaluation (Signed)
 Occupational Therapy Evaluation Patient Details Name: [name] MRN: [id number] DOB: [birth year] Today's Date: 08/19/2023   History of Present Illness   [name] is an 58 y.o. female who presented with worsening SOB. Found to have PNA and  splenic laceration versus heterogeneous enhancement. PMHx: obesity, depression, asthma, OSA, myasthenia gravis, chronic hypoxic hypercarbic respiratory failure on 2 L O2, remote history of PE (not on anticoagulation), HTN, hypothyroidism     Clinical Impressions [name] was evaluated s/p the above admission list. She is indep at baseline, she lives with he sister who works but has other family nearby who can assist as needed. Upon evaluation the pt was limited by decreased activity tolerance, SOB with movement, global weakness, abdominal pain, bilat foot pain and body habitus. Overall she needed max A to transfer to the EOB, unable to progress to standing transfers due to elevated stretcher height. Due to the deficits listed below the pt also needs up to total A for LB ADLs at bed level and set up A for UB ADLs in sitting. Pt will benefit from continued acute OT services and HHOT with anticipation she will progress will acutely.       If plan is discharge home, recommend the following:   A little help with walking and/or transfers;A little help with bathing/dressing/bathroom;Two people to help with bathing/dressing/bathroom;Assist for transportation;Help with stairs or ramp for entrance     Functional Status Assessment   Patient has had a recent decline in their functional status and demonstrates the ability to make significant improvements in function in a reasonable and predictable amount of time.     Equipment Recommendations   Other (comment) (pending acute progress)     Recommendations for Other Services         Precautions/Restrictions   Precautions Precautions: Fall Precaution/Restrictions Comments: 3L   Restrictions Weight Bearing Restrictions Per Provider Order: No     Mobility Bed Mobility Overal bed mobility: Needs Assistance Bed Mobility: Supine to Sit, Sit to Supine     Supine to sit: Max assist Sit to supine: Mod assist        Transfers                   General transfer comment: unable to assess on eval      Balance Overall balance assessment: Needs assistance Sitting-balance support: Single extremity supported Sitting balance-Leahy Scale: Fair                                     ADL either performed or assessed with clinical judgement   ADL Overall ADL's : Needs assistance/impaired Eating/Feeding: Set up;Sitting   Grooming: Set up;Sitting   Upper Body Bathing: Contact guard assist;Sitting   Lower Body Bathing: Maximal assistance   Upper Body Dressing : Minimal assistance   Lower Body Dressing: Total assistance   Toilet Transfer: Total assistance Toilet Transfer Details (indicate cue type and reason): bed level - unable to assess OOB on evaluation due to elevated stretcher hegiht Toileting- Clothing Manipulation and Hygiene: Total assistance       Functional mobility during ADLs: Maximal assistance General ADL Comments: limited by pain, global weakness, fear of falling and elevated stretcher height in ED     Vision Baseline Vision/History: 1 Wears glasses Ability to See in Adequate Light: 0 Adequate Vision Assessment?: Wears glasses for reading     Perception Perception: Not tested  Praxis Praxis: Not tested       Pertinent Vitals/Pain Pain Assessment Pain Assessment: Faces Pain Score: 6  Pain Location: abdomen and bilat feet Pain Descriptors / Indicators: Grimacing, Guarding Pain Intervention(s): Limited activity within patient's tolerance, Premedicated before session     Extremity/Trunk Assessment Upper Extremity Assessment Upper Extremity Assessment: Overall WFL for tasks assessed   Lower  Extremity Assessment Lower Extremity Assessment: Defer to PT evaluation   Cervical / Trunk Assessment Cervical / Trunk Assessment: Other exceptions Cervical / Trunk Exceptions: body habitus   Communication Communication Communication: No apparent difficulties   Cognition Arousal: Alert Behavior During Therapy: WFL for tasks assessed/performed Cognition: No family/caregiver present to determine baseline             OT - Cognition Comments: WFL for orientation, command follow. may benefit from higher level assessment                 Following commands: Intact       Cueing  General Comments   Cueing Techniques: Verbal cues  VSS on 3L   Exercises     Shoulder Instructions      Home Living Family/patient expects to be discharged to:: Private residence Living Arrangements: Other relatives (sister) Available Help at Discharge: Family;Available 24 hours/day Type of Home: House Home Access: Stairs to enter   Entrance Stairs-Rails:  (pt states rails are raggedy) Home Layout: One level     Bathroom Shower/Tub: Chief Strategy Officer: Standard     Home Equipment: None (home O2)   Additional Comments: sister works 8-5; she has 6 other sisters near by      Prior Functioning/Environment Prior Level of Function : Independent/Modified Independent             Mobility Comments: indep, no AD, denies falls ADLs Comments: mod I for ADLs, sister assists with IADLs    OT Problem List: Pain;Cardiopulmonary status limiting activity;Decreased range of motion;Decreased activity tolerance;Impaired balance (sitting and/or standing)   OT Treatment/Interventions: Self-care/ADL training;Therapeutic exercise;DME and/or AE instruction;Cognitive remediation/compensation;Patient/family education;Balance training      OT Goals(Current goals can be found in the care plan section)   Acute Rehab OT Goals Patient Stated Goal: less pain OT Goal Formulation:  With patient Time For Goal Achievement: 09/02/23 Potential to Achieve Goals: Good ADL Goals Pt Will Perform Grooming: with supervision;standing Pt Will Perform Lower Body Dressing: with supervision;sit to/from stand Pt Will Transfer to Toilet: with supervision;ambulating Additional ADL Goal #1: Pt will complete bed mobility with supervision A as a precursor to ADLs   OT Frequency:  Min 2X/week    Co-evaluation              AM-PAC OT 6 Clicks Daily Activity     Outcome Measure Help from another person eating meals?: A Little Help from another person taking care of personal grooming?: A Little Help from another person toileting, which includes using toliet, bedpan, or urinal?: A Lot Help from another person bathing (including washing, rinsing, drying)?: A Lot Help from another person to put on and taking off regular upper body clothing?: A Little Help from another person to put on and taking off regular lower body clothing?: Total 6 Click Score: 14   End of Session Equipment Utilized During Treatment: Oxygen Nurse Communication: Mobility status  Activity Tolerance: Patient tolerated treatment well Patient left: in bed;with call bell/phone within reach  OT Visit Diagnosis: Unsteadiness on feet (R26.81);Muscle weakness (generalized) (M62.81)  Time: 9091-9071 OT Time Calculation (min): 20 min Charges:  OT General Charges $OT Visit: 1 Visit OT Evaluation $OT Eval Moderate Complexity: 1 Mod  Lucie Kendall, OTR/L Acute Rehabilitation Services Office [phone] Secure Chat Communication Preferred   Lucie JONETTA Kendall 08/19/2023, 10:06 AM

## 2023-08-19 NOTE — Progress Notes (Signed)
NIF -35  

## 2023-08-19 NOTE — ED Notes (Signed)
 Pt was medicated for scheduled AM, was given water, pt was found vomiting. 1 pill found in vomit. bile noted

## 2023-08-19 NOTE — Evaluation (Signed)
 Physical Therapy Evaluation Patient Details Name: [name] MRN: [id number] DOB: 1965-07-28 Today's Date: 08/19/2023  History of Present Illness  [name] is an 58 y.o. female who presented with worsening SOB. Found to have PNA and  splenic laceration versus heterogeneous enhancement. PMHx: obesity, depression, asthma, OSA, myasthenia gravis, chronic hypoxic hypercarbic respiratory failure on 2 L O2, remote history of PE (not on anticoagulation), HTN, hypothyroidism  Clinical Impression  Pt is presenting below baseline level of functioning. Pt session was limited today due to fatigue. Nursing assisted with total A transfer from stretcher to hospital bed and pt assist with rolling R/L 2x each side for assist with care at Max A and use of bed rails. Due to pt current functional status, home set up and available assistance at home recommending skilled physical therapy services < 3 hours/day in order to address strength, balance and functional mobility to decrease risk for falls, injury, immobility, skin break down and re-hospitalization.          If plan is discharge home, recommend the following: Two people to help with walking and/or transfers;Assistance with cooking/housework;Help with stairs or ramp for entrance;Assist for transportation   Can travel by private vehicle   No    Equipment Recommendations Wheelchair cushion (measurements PT);Wheelchair (measurements PT);Hospital bed     Functional Status Assessment Patient has had a recent decline in their functional status and demonstrates the ability to make significant improvements in function in a reasonable and predictable amount of time.     Precautions / Restrictions Precautions Precautions: Fall Precaution/Restrictions Comments: 3L [place] Restrictions Weight Bearing Restrictions Per Provider Order: No      Mobility  Bed Mobility Overal bed mobility: Needs Assistance Bed Mobility: Rolling Rolling: Max assist          General bed mobility comments: Max A rolling R/Rolling L pt fatigued and short of breathe after rolling    Transfers Overall transfer level: Needs assistance Equipment used: None Transfers: Bed to chair/wheelchair/BSC            Lateral/Scoot Transfers: +2 physical assistance, Total assist General transfer comment: deferred due to fatigue. Pt was transferred from stretcher to bed at total A    Ambulation/Gait     General Gait Details: unable at this time       Pertinent Vitals/Pain Pain Assessment Pain Assessment: Faces Faces Pain Scale: Hurts even more Pain Location: abdomen and bilat feet Pain Descriptors / Indicators: Grimacing, Guarding Pain Intervention(s): Limited activity within patient's tolerance, Monitored during session    Home Living Family/patient expects to be discharged to:: Private residence Living Arrangements: Other relatives (sister) Available Help at Discharge: Family;Available 24 hours/day Type of Home: House Home Access: Stairs to enter Entrance Stairs-Rails:  (pt states rails are raggedy)     Home Layout: One level Home Equipment: None (home O2) Additional Comments: sister works 8-5; she has 6 other sisters near by    Prior Function Prior Level of Function : Independent/Modified Independent             Mobility Comments: indep, no AD, denies falls ADLs Comments: mod I for ADLs, sister assists with IADLs     Extremity/Trunk Assessment   Upper Extremity Assessment Upper Extremity Assessment: Defer to OT evaluation    Lower Extremity Assessment Lower Extremity Assessment: Generalized weakness    Cervical / Trunk Assessment Cervical / Trunk Assessment: Normal  Communication   Communication Communication: No apparent difficulties    Cognition Arousal: Alert Behavior During Therapy:  WFL for tasks assessed/performed   PT - Cognitive impairments: No apparent impairments     Following commands: Intact        Cueing Cueing Techniques: Verbal cues     General Comments General comments (skin integrity, edema, etc.): pt fatigued and short of breathe on 3L of O2 after total assist transfer and rolling R/L        Assessment/Plan    PT Assessment Patient needs continued PT services  PT Problem List Decreased strength;Decreased activity tolerance;Decreased balance;Decreased mobility;Pain       PT Treatment Interventions DME instruction;Balance training;Gait training;Functional mobility training;Therapeutic activities;Therapeutic exercise;Patient/family education;Stair training    PT Goals (Current goals can be found in the Care Plan section)  Acute Rehab PT Goals Patient Stated Goal: to improve mobility PT Goal Formulation: With patient Time For Goal Achievement: 09/02/23 Potential to Achieve Goals: Good    Frequency Min 2X/week        AM-PAC PT 6 Clicks Mobility  Outcome Measure Help needed turning from your back to your side while in a flat bed without using bedrails?: A Lot Help needed moving from lying on your back to sitting on the side of a flat bed without using bedrails?: A Lot Help needed moving to and from a bed to a chair (including a wheelchair)?: Total Help needed standing up from a chair using your arms (e.g., wheelchair or bedside chair)?: Total Help needed to walk in hospital room?: Total Help needed climbing 3-5 steps with a railing? : Total 6 Click Score: 8    End of Session   Activity Tolerance: Patient limited by fatigue;Patient limited by pain Patient left: in bed;with call bell/phone within reach;with nursing/sitter in room Nurse Communication: Mobility status PT Visit Diagnosis: Other abnormalities of gait and mobility (R26.89);Muscle weakness (generalized) (M62.81)    Time: 8681-8658 PT Time Calculation (min) (ACUTE ONLY): 23 min   Charges:   PT Evaluation $PT Eval Low Complexity: 1 Low PT Treatments $Therapeutic Activity: 8-22 mins PT General  Charges $$ ACUTE PT VISIT: 1 Visit        Dorothyann Maier, DPT, CLT  Acute Rehabilitation Services Office: [phone] (Secure chat preferred)   Dorothyann VEAR Maier 08/19/2023, 2:17 PM

## 2023-08-20 ENCOUNTER — Inpatient Hospital Stay (HOSPITAL_COMMUNITY)

## 2023-08-20 DIAGNOSIS — J189 Pneumonia, unspecified organism: Secondary | ICD-10-CM | POA: Diagnosis not present

## 2023-08-20 DIAGNOSIS — A419 Sepsis, unspecified organism: Secondary | ICD-10-CM | POA: Diagnosis not present

## 2023-08-20 LAB — CBC WITH DIFFERENTIAL/PLATELET
Abs Granulocyte: 16.5 K/uL — ABNORMAL HIGH (ref 1.5–6.5)
Abs Immature Granulocytes: 0.1 K/uL — ABNORMAL HIGH (ref 0.00–0.07)
Basophils Absolute: 0 K/uL (ref 0.0–0.1)
Basophils Relative: 0 %
Eosinophils Absolute: 0 K/uL (ref 0.0–0.5)
Eosinophils Relative: 0 %
HCT: 38.1 % (ref 36.0–46.0)
Hemoglobin: 11.6 g/dL — ABNORMAL LOW (ref 12.0–15.0)
Immature Granulocytes: 1 %
Lymphocytes Relative: 4 %
Lymphs Abs: 0.7 K/uL (ref 0.7–4.0)
MCH: 29.9 pg (ref 26.0–34.0)
MCHC: 30.4 g/dL (ref 30.0–36.0)
MCV: 98.2 fL (ref 80.0–100.0)
Monocytes Absolute: 1.2 K/uL — ABNORMAL HIGH (ref 0.1–1.0)
Monocytes Relative: 7 %
Neutro Abs: 16.5 K/uL — ABNORMAL HIGH (ref 1.7–7.7)
Neutrophils Relative %: 88 %
Platelets: 67 K/uL — ABNORMAL LOW (ref 150–400)
RBC: 3.88 MIL/uL (ref 3.87–5.11)
RDW: 15.4 % (ref 11.5–15.5)
WBC: 18.6 K/uL — ABNORMAL HIGH (ref 4.0–10.5)
nRBC: 0.1 % (ref 0.0–0.2)

## 2023-08-20 LAB — BASIC METABOLIC PANEL WITH GFR
Anion gap: 10 (ref 5–15)
BUN: 9 mg/dL (ref 6–20)
CO2: 33 mmol/L — ABNORMAL HIGH (ref 22–32)
Calcium: 8.9 mg/dL (ref 8.9–10.3)
Chloride: 99 mmol/L (ref 98–111)
Creatinine, Ser: 0.51 mg/dL (ref 0.44–1.00)
GFR, Estimated: >60 mL/min (ref >60)
Glucose, Bld: 110 mg/dL — ABNORMAL HIGH (ref 70–99)
Potassium: 4.5 mmol/L (ref 3.5–5.1)
Sodium: 142 mmol/L (ref 135–145)

## 2023-08-20 MED ORDER — SODIUM CHLORIDE 0.9 % IV SOLN
100.0000 mg | Freq: Two times a day (BID) | INTRAVENOUS | Status: AC
  Administered 2023-08-20 – 2023-08-27 (×15): 100 mg via INTRAVENOUS
  Filled 2023-08-20 (×17): qty 100

## 2023-08-20 MED ORDER — FAMOTIDINE IN NACL 20-0.9 MG/50ML-% IV SOLN
20.0000 mg | Freq: Two times a day (BID) | INTRAVENOUS | Status: DC
  Administered 2023-08-20 – 2023-08-25 (×11): 20 mg via INTRAVENOUS
  Filled 2023-08-20 (×11): qty 50

## 2023-08-20 MED ORDER — HYDRALAZINE HCL 20 MG/ML IJ SOLN: 10.0000 mg | Freq: Four times a day (QID) | INTRAMUSCULAR | Status: DC | PRN

## 2023-08-20 NOTE — Care Management Important Message (Signed)
 Important Message  Patient Details  Name: [name] MRN: [id number] Date of Birth: [birth year]   Important Message Given:        Claretta Deed 08/20/2023, 3:36 PM

## 2023-08-20 NOTE — Progress Notes (Signed)
 Subjective: CC: Patient reports left sided abdominal pain that is improved this am. Episode of n/v this am. No current nausea. Passing flatus. No BM. She denies any falls or trauma to the abdomen.   Objective: Vital signs in last 24 hours: Temp:  [98 F (36.7 C)-98.9 F (37.2 C)] 98.1 F (36.7 C) (08/01 0831) Pulse Rate:  [80-101] 89 (08/01 0831) Resp:  [20-41] 20 (08/01 0831) BP: (134-145)/(63-77) 145/76 (08/01 0831) SpO2:  [91 %-100 %] 97 % (08/01 0831) Last BM Date : 08/17/23  Intake/Output from previous day: 07/31 0701 - 08/01 0700 In: 1000.8 [I.V.:1000.8] Out: 400 [Urine:400] Intake/Output this shift: No intake/output data recorded.  PE: Gen:  Alert, NAD, pleasant Abd: Soft, mild distension, left sided ttp greatest in the LUQ without rigidity or guarding.   Lab Results:  Recent Labs    08/18/23 1748 08/18/23 1905 08/19/23 0212 08/19/23 1455 08/19/23 1721 08/19/23 2149  WBC 16.2*  --  14.8*  --   --   --   HGB 12.6   < > 12.1   < > 12.5 13.2  HCT 42.2   < > 40.2   < > 41.3 43.9  PLT 116*  --  106*  --   --   --    < > = values in this interval not displayed.   BMET Recent Labs    08/18/23 1748 08/18/23 1905 08/19/23 0212  NA 140 139 139  K 5.1 5.3* 4.8  CL 99  --  99  CO2 34*  --  32  GLUCOSE 109*  --  120*  BUN 7  --  5*  CREATININE 0.84  --  0.55  CALCIUM 9.0  --  8.8*   PT/INR Recent Labs    08/18/23 1748 08/19/23 0212  LABPROT 16.2* 16.2*  INR 1.2 1.2   CMP     Component Value Date/Time   NA 139 08/19/2023 0212   K 4.8 08/19/2023 0212   CL 99 08/19/2023 0212   CO2 32 08/19/2023 0212   GLUCOSE 120 (H) 08/19/2023 0212   BUN 5 (L) 08/19/2023 0212   CREATININE 0.55 08/19/2023 0212   CALCIUM 8.8 (L) 08/19/2023 0212   PROT 6.9 08/19/2023 0212   ALBUMIN 3.0 (L) 08/19/2023 0212   AST 43 (H) 08/19/2023 0212   ALT 13 08/19/2023 0212   ALKPHOS 50 08/19/2023 0212   BILITOT 1.0 08/19/2023 0212   GFRNONAA >60 08/19/2023 0212    GFRAA >60 12/21/2018 1236   Lipase     Component Value Date/Time   LIPASE 25 09/09/2018 1416    Studies/Results: CT Angio Abd/Pel w/ and/or w/o Result Date: 08/18/2023 CLINICAL DATA:  Abnormal spleen EXAM: CTA ABDOMEN AND PELVIS WITHOUT AND WITH CONTRAST TECHNIQUE: Multidetector CT imaging of the abdomen and pelvis was performed using the standard protocol during bolus administration of intravenous contrast. Multiplanar reconstructed images and MIPs were obtained and reviewed to evaluate the vascular anatomy. RADIATION DOSE REDUCTION: This exam was performed according to the departmental dose-optimization program which includes automated exposure control, adjustment of the mA and/or kV according to patient size and/or use of iterative reconstruction technique. CONTRAST:  OMNIPAQUE  IOHEXOL  350 MG/ML SOLN COMPARISON:  CT earlier today. FINDINGS: VASCULAR Aorta: Normal caliber aorta without aneurysm, dissection, vasculitis or significant stenosis. Celiac: Patent without evidence of aneurysm, dissection, vasculitis or significant stenosis. SMA: Patent without evidence of aneurysm, dissection, vasculitis or significant stenosis. Renals: Both renal arteries are patent without evidence of  aneurysm, dissection, vasculitis, fibromuscular dysplasia or significant stenosis. IMA: Patent without evidence of aneurysm, dissection, vasculitis or significant stenosis. Inflow: Patent without evidence of aneurysm, dissection, vasculitis or significant stenosis. Proximal Outflow: Bilateral common femoral and visualized portions of the superficial and profunda femoral arteries are patent without evidence of aneurysm, dissection, vasculitis or significant stenosis. Veins: No obvious venous abnormality within the limitations of this arterial phase study. Review of the MIP images confirms the above findings. NON-VASCULAR Lower chest: Bilateral lower lobe airspace opacities, left greater than right. No effusions.  Hepatobiliary: Liver is enlarged. No focal liver lesion. Gallbladder unremarkable. No biliary ductal dilatation. Pancreas: No focal abnormality or ductal dilatation. Spleen: Markedly abnormal spleen. There appears to be splenic lacerations in the midpole region with surrounding subcapsular/perisplenic hematoma. No active extravasation. Adrenals/Urinary Tract: Adrenal glands normal. Multiple small bilateral renal cysts appear benign. No follow-up imaging recommended. No hydronephrosis. Urinary bladder unremarkable. Stomach/Bowel: Sigmoid diverticulosis. No active diverticulitis. Stomach and small bowel decompressed, unremarkable. Lymphatic: No adenopathy Reproductive: Multiple uterine fibroids.  No adnexal mass. Other: No free fluid or free air. Musculoskeletal: No acute bony abnormality. IMPRESSION: Markedly abnormal appearance of the spleen with apparent splenic lacerations in the midpole region. Surrounding subcapsular hematoma. No free fluid in the abdomen or pelvis. No active extravasation of contrast. Hepatomegaly. Bilateral lower lobe airspace opacities, left greater than right concerning for pneumonia. Fibroid uterus. Electronically Signed   By: Franky Crease M.D.   On: 08/18/2023 22:23   CT Angio Chest PE W/Cm &/Or Wo Cm Result Date: 08/18/2023 CLINICAL DATA:  Shortness of breath. EXAM: CT ANGIOGRAPHY CHEST WITH CONTRAST TECHNIQUE: Multidetector CT imaging of the chest was performed using the standard protocol during bolus administration of intravenous contrast. Multiplanar CT image reconstructions and MIPs were obtained to evaluate the vascular anatomy. RADIATION DOSE REDUCTION: This exam was performed according to the departmental dose-optimization program which includes automated exposure control, adjustment of the mA and/or kV according to patient size and/or use of iterative reconstruction technique. CONTRAST:  75mL OMNIPAQUE  IOHEXOL  350 MG/ML SOLN COMPARISON:  December 21, 2018. FINDINGS:  Cardiovascular: No evidence of large central pulmonary embolus seen in the main pulmonary artery or proximal portions of the left and right pulmonary arteries. However, lower lobe branches bilaterally are not well visualized due to surrounding atelectasis and limited opacification, and therefore smaller and more peripheral pulmonary emboli cannot be excluded on the basis of this exam. Mild cardiomegaly is noted. No pericardial effusion. Mediastinum/Nodes: No enlarged mediastinal, hilar, or axillary lymph nodes. Thyroid  gland, trachea, and esophagus demonstrate no significant findings. Lungs/Pleura: No pneumothorax or pleural effusion is noted. Bilateral posterior basilar atelectasis is noted. Upper Abdomen: No acute abnormality. Musculoskeletal: No chest wall abnormality. No acute or significant osseous findings. Review of the MIP images confirms the above findings. IMPRESSION: No definite evidence of large central pulmonary embolus seen in main pulmonary artery or proximal portions of left and right pulmonary arteries. However, due to adjacent atelectasis and limited contrast opacification, lower lobe branches of both pulmonary arteries are not well visualized and therefore smaller and more peripheral pulmonary emboli cannot be excluded on the basis of this exam. Electronically Signed   By: Lynwood Landy Raddle M.D.   On: 08/18/2023 19:31   CT ABDOMEN PELVIS W CONTRAST Result Date: 08/18/2023 CLINICAL DATA:  Sepsis. EXAM: CT ABDOMEN AND PELVIS WITH CONTRAST TECHNIQUE: Multidetector CT imaging of the abdomen and pelvis was performed using the standard protocol following bolus administration of intravenous contrast. RADIATION DOSE REDUCTION: This exam  was performed according to the departmental dose-optimization program which includes automated exposure control, adjustment of the mA and/or kV according to patient size and/or use of iterative reconstruction technique. CONTRAST:  75mL OMNIPAQUE  IOHEXOL  350 MG/ML SOLN  COMPARISON:  CT abdomen and pelvis 03/09/2018 FINDINGS: Lower chest: There are mild patchy airspace opacities in both lower lobes, left greater than right. Hepatobiliary: The liver is mildly enlarged. No focal liver lesions are seen. Gallbladder and bile ducts are within normal limits. Pancreas: Unremarkable. No pancreatic ductal dilatation or surrounding inflammatory changes. Spleen: The spleen appears heterogeneously enhancing which may be related to phase of contrast. Splenic laceration and subcapsular fluid collections are a possibility as well. Overall measurements of the spleen are slightly larger when compared to 2020. There is no surrounding inflammatory stranding. Adrenals/Urinary Tract: Bilateral renal cysts are present measuring up to 3.4 cm on the right. There is no hydronephrosis. The adrenal glands and bladder are within normal limits. Stomach/Bowel: Stomach is within normal limits. Appendix appears normal. No evidence of bowel wall thickening, distention, or inflammatory changes. There is sigmoid colon diverticulosis. Vascular/Lymphatic: Aortic atherosclerosis. No enlarged abdominal or pelvic lymph nodes. Reproductive: Uterus is enlarged with multiple fibroids. Largest fibroid is on the right measuring 6.8 cm. Adnexa are within normal limits. Other: There is mild diffuse body wall edema. There is no focal abdominal wall hernia. There is trace fluid in the left upper quadrant. Musculoskeletal: No acute or significant osseous findings. IMPRESSION: 1. Indeterminate heterogeneous enhancement of the spleen with mild enlargement when compared to prior. Splenic laceration and subcapsular fluid collections are a possibility as well as infection. 2. Trace fluid in the left upper quadrant. 3. Mild patchy airspace opacities in both lower lobes, left greater than right, worrisome for pneumonia. 4. Mild hepatomegaly. 5. Uterine fibroids. 6. Aortic atherosclerosis. Aortic Atherosclerosis (ICD10-I70.0).  Electronically Signed   By: Greig Pique M.D.   On: 08/18/2023 19:31   DG Chest Port 1 View Result Date: 08/18/2023 CLINICAL DATA:  Question of sepsis to evaluate for abnormality. Shortness of breath and bilateral leg weakness. EXAM: PORTABLE CHEST 1 VIEW COMPARISON:  12/21/2018 FINDINGS: Postoperative changes in the mediastinum. Shallow inspiration. Cardiac enlargement with mild central vascular congestion. Interstitial changes in the lungs likely represent mild interstitial edema. Linear scarring or atelectasis in the lung bases. No pleural effusion pneumothorax. Mediastinal contours appear intact. IMPRESSION: Cardiac enlargement with developing pulmonary vascular congestion and suggestion of mild interstitial edema. Electronically Signed   By: Elsie Gravely M.D.   On: 08/18/2023 17:58    Anti-infectives: Anti-infectives (From admission, onward)    Start     Dose/Rate Route Frequency Ordered Stop   08/19/23 1800  cefTRIAXone  (ROCEPHIN ) 2 g in sodium chloride  0.9 % 100 mL IVPB        2 g 200 mL/hr over 30 Minutes Intravenous Every 24 hours 08/18/23 2101     08/18/23 2200  doxycycline  (VIBRA -TABS) tablet 100 mg        100 mg Oral Every 12 hours 08/18/23 2156     08/18/23 1730  cefTRIAXone  (ROCEPHIN ) 2 g in sodium chloride  0.9 % 100 mL IVPB        2 g 200 mL/hr over 30 Minutes Intravenous Once 08/18/23 1726 08/18/23 1841        Assessment/Plan Non-traumatic splenic laceration  - CTA 7/30 w/ apparent splenic lacerations in the midpole region with surrounding subcapsular hematoma. No free fluid in the abdomen or pelvis. No active extravasation of contrast. - Hgb stable on  last check. Repeat labs pending this morning. Continue to trend.  - HDS without tachycardia or hypotension on last vital check.  - No peritonitis on exam - No indication for emergency surgery. Will hold off on consulting IR. If significantly dropping hgb or develops hemodynamic changes, please let our team know so we  can evaluate and provide updated recs.  - Possible ileus from intra-abdominal hematoma. NPO currently. If any further n/v recommend abdominal xray  FEN - NPO, IVF per primary  VTE - SCDs, on hold for above ID - Rocephin /Doxy per primary for PNA  I reviewed nursing notes, hospitalist notes, last 24 h vitals and pain scores, last 48 h intake and output, last 24 h labs and trends, and last 24 h imaging results.   LOS: 2 days    Ozell CHRISTELLA Shaper, [facility] 08/20/2023, 9:20 AM Please see Amion for pager number during day hours 7:00am-4:30pm

## 2023-08-20 NOTE — Progress Notes (Signed)
 Progress Note   Patient: [name] FMW:[id number] DOB: [birth year] DOA: 08/18/2023     2 DOS: the patient was seen and examined on 08/20/2023   Brief hospital course:  58 y.o. female with medical history significant for myasthenia gravis, moderate persistent asthma, chronic hypoxic and hypercarbic respiratory failure on 2 L O2 [place], history of PE no longer on anticoagulation, HTN, hypothyroidism, depression, morbid obesity, OSA on CPAP who presented to the ED for evaluation of shortness of breath.   Assessment and Plan:  Sepsis due to bilateral lower lobe possible bacterial pneumonia: Presenting with leukocytosis, tachycardia, tachypnea.  Imaging suspicious for bilateral lobe pneumonia.  COVID, influenza, RSV negative.  Lactic acidosis has resolved. - Continue ceftriaxone  and doxycycline  - Received IVF - Follow blood cultures-NGTD  - f/u strep pneumonia (negative) and Legionella urine antigens - Continue supplemental O2 as needed and wean to home 2 L [place] as able   Acute on chronic hypoxic and hypercapnic respiratory failure: In setting of pneumonia as above as well as OHS/OSA.  Patient uses 2 L O2 [place] at baseline, requiring 4 L O2 [place] on admission.  With history of myasthenia gravis, she is at increased risk for respiratory decompensation. - Continue supplemental O2 - NIF/VC q12h checks - Continue CPAP nightly   Elevated troponin/supply demand mismatch: Mild troponin elevation, downtrending on repeat.  Patient denies chest pain.  EKG without significant ST changes.  Likely demand ischemia in setting of sepsis and hypoxic respiratory failure.  Monitor on telemetry.   Splenic laceration and subcapsular hematoma: Non-traumatic CT A/P showed indeterminate heterogeneous enhancement of the spleen with mild enlargement when compared to prior.  Splenic laceration and subcapsular fluid collections are possibility as well as infection.  - CTA abdomen showed markedly abnormal spleen with splenic  lacerations at mid pole and subcapsular hematoma - Appreciate general surgery recommendations -f/u H&H closely and avoid any anticoagulants. -May need IR intervention if she decompensates.  Possible ileus: She has nausea, vomiting and abdominal pain. Kept NPO. Ordered KUB Xray.   Myasthenia gravis: Continue Mestinon .  Hold CellCept  for now.  Monitor NIF/VC as above.  PT/OT eval.   Moderate persistent asthma: No obvious wheezing on admission.  Continue Singulair  and albuterol  nebulizers as needed.   Hypertension: Continue losartan . Prn antihypertensives   Thrombocytopenia: New mild thrombocytopenia.  CTA abdomen to evaluate spleen as above.  Will hold blood thinners.   Hypothyroidism: Continue Synthroid .   OSA: Continue CPAP nightly.   Major moderate Depression: Continue Abilify  and Remeron .   Class III obesity: Estimated body mass index is 65.84 kg/m as calculated from the following:   Height as of 10/20/22: 5' 2 (1.575 m).   Weight as of 10/20/22: 163.3 kg.   She may be a candidate for anti-obesity meds as an out patient.     Subjective: She feels better but still nauseous and had an episode of vomiting this morning. She said she uses 2 L oxygen as needed at home.  Physical Exam: Vitals:   08/20/23 0200 08/20/23 0300 08/20/23 0400 08/20/23 0831  BP:    (!) 145/76  Pulse:  96 95 89  Resp: (!) 39 (!) 23 (!) 24 20  Temp:    98.1 F (36.7 C)  TempSrc:    Oral  SpO2: 93% 99% 100% 97%   Constitutional: Mild to moderate distress, nauseous, nasal oxygen cannula in place Eyes: PERRL, lids and conjunctivae normal ENMT: Mucous membranes are moist. Posterior pharynx clear of any exudate or lesions.Normal dentition.  Neck: normal, supple, no masses, no thyromegaly Respiratory: coarse breath sounds bilaterally Cardiovascular: Regular rate and rhythm, no murmurs / rubs / gallops. No extremity edema. 2+ pedal pulses. No carotid bruits.  Abdomen: tenderness to palpation  diffusely, no masses palpated. No hepatosplenomegaly. Bowel sounds hypoactive.  Musculoskeletal: no clubbing / cyanosis. No joint deformity upper and lower extremities. Good ROM, no contractures. Normal muscle tone.  Skin: no rashes, lesions, ulcers. No induration Neurologic: CN 2-12 grossly intact. Sensation intact, DTR normal. Strength 5/5 x all 4 extremities.  Psychiatric: Normal judgment and insight. Alert and oriented x 3. Normal mood.   Data Reviewed:  There are no new results to review at this time.  Family Communication: None at the bedside  Disposition: Status is: Inpatient Remains inpatient appropriate because: Splenic laceration, ARF  Planned Discharge Destination: Home with Home Health    Time spent: 41 minutes  Author: Deliliah Room, MD 08/20/2023 9:54 AM  For on call review [email or web address].

## 2023-08-20 NOTE — TOC Initial Note (Signed)
 Transition of Care [facility]) - Initial/Assessment Note    Patient Details  Name: [name] MRN: [id number] Date of Birth: 1965-10-26  [facility] CM/SW Contact:    Montie LOISE Louder, LCSW Phone Number: 08/20/2023, 12:15 PM  Clinical Narrative:                  CSW met with patient at bedside. CSW introduced self and explained role. CSW discussed with patient recommendation of short rehab at [facility]. Patient she lives in the home w/her sister, [name]. She states her sister Apolinar helps to care for her. She declined SNF, prefers HH. No preferred agency. CSW inquired about transportation but she fell asleep during our conversation. Patient may need PTAR.  TOC will continue to follow and assist with discharge planning.   Montie Louder, MSW, LCSW Clinical Social Worker    Expected Discharge Plan: Home w Home Health Services Barriers to Discharge: Continued Medical Work up   Patient Goals and CMS Choice            Expected Discharge Plan and Services In-house Referral: Clinical Social Work                                            Prior Living Arrangements/Services   Lives with:: Self, Siblings Patient language and need for interpreter reviewed:: No        Need for Family Participation in Patient Care: Yes (Comment) Care giver support system in place?: Yes (comment)   Criminal Activity/Legal Involvement Pertinent to Current Situation/Hospitalization: No - Comment as needed  Activities of Daily Living   ADL Screening (condition at time of admission) Independently performs ADLs?: Yes (appropriate for developmental age) Is the patient deaf or have difficulty hearing?: No Does the patient have difficulty seeing, even when wearing glasses/contacts?: No Does the patient have difficulty concentrating, remembering, or making decisions?: No  Permission Sought/Granted Permission sought to share information with : Family Supports Permission  granted to share information with : Yes, Verbal Permission Granted  Share Information with NAME: E Mcneil     Permission granted to share info w Relationship: sister  Permission granted to share info w Contact Information: [phone]  Emotional Assessment   Attitude/Demeanor/Rapport:  (sleepy) Affect (typically observed): Appropriate Orientation: : Oriented to Self, Oriented to Place, Oriented to  Time, Oriented to Situation Alcohol / Substance Use: Not Applicable Psych Involvement: No (comment)  Admission diagnosis:  Shortness of breath [R06.02] Sepsis due to pneumonia (HCC) [J18.9, A41.9] Patient Active Problem List   Diagnosis Date Noted   Sepsis due to pneumonia (HCC) 08/18/2023   Acute on chronic respiratory failure with hypoxia and hypercapnia (HCC) 08/18/2023   Elevated troponin 08/18/2023   Thrombocytopenia (HCC) 08/18/2023   Moderate persistent asthma 08/18/2023   Splenic laceration, initial encounter 08/18/2023   History of pulmonary embolus (PE) 11/21/2018   Pulmonary emboli (HCC) 08/19/2016   Asthma exacerbation 08/16/2016   Depression 06/20/2014   Anxiety 06/20/2014   Sleep apnea 06/20/2014   GERD (gastroesophageal reflux disease) 12/28/2012   Hypothyroidism 12/31/1999   Morbid obesity (HCC) 03/02/1994   Myasthenia gravis (HCC) 09/10/1992   PCP:  Minerva Rosina HERO, MD Pharmacy:   [facility] [id number] [address], [place] - [address] AT [facility] [address] [place] [address] Phone: [phone] Fax: [phone]  Social Drivers of Health (SDOH) Social History: SDOH Screenings   Food Insecurity: No Food Insecurity (08/19/2023)  Housing: Low Risk  (08/19/2023)  Transportation Needs: No Transportation Needs (08/19/2023)  Utilities: Not At Risk (08/19/2023)  Tobacco Use: Medium Risk (08/18/2023)  Health Literacy: High Risk (04/27/2020)   Received from [facility]   SDOH Interventions:      Readmission Risk Interventions     No data to display

## 2023-08-20 NOTE — Progress Notes (Signed)
 Pt did -35 on NIF with good effort & technique

## 2023-08-20 NOTE — Progress Notes (Signed)
 NIF -40 with good effort and technique

## 2023-08-21 DIAGNOSIS — A419 Sepsis, unspecified organism: Secondary | ICD-10-CM | POA: Diagnosis not present

## 2023-08-21 DIAGNOSIS — J189 Pneumonia, unspecified organism: Secondary | ICD-10-CM | POA: Diagnosis not present

## 2023-08-21 LAB — LEGIONELLA PNEUMOPHILA SEROGP 1 UR AG: L. pneumophila Serogp 1 Ur Ag: NEGATIVE

## 2023-08-21 LAB — BASIC METABOLIC PANEL WITH GFR
Anion gap: 9 (ref 5–15)
BUN: 12 mg/dL (ref 6–20)
CO2: 35 mmol/L — ABNORMAL HIGH (ref 22–32)
Calcium: 9.4 mg/dL (ref 8.9–10.3)
Chloride: 98 mmol/L (ref 98–111)
Creatinine, Ser: 0.52 mg/dL (ref 0.44–1.00)
GFR, Estimated: >60 mL/min (ref >60)
Glucose, Bld: 104 mg/dL — ABNORMAL HIGH (ref 70–99)
Potassium: 4.5 mmol/L (ref 3.5–5.1)
Sodium: 142 mmol/L (ref 135–145)

## 2023-08-21 LAB — CBC
HCT: 37.1 % (ref 36.0–46.0)
Hemoglobin: 11 g/dL — ABNORMAL LOW (ref 12.0–15.0)
MCH: 29.6 pg (ref 26.0–34.0)
MCHC: 29.6 g/dL — ABNORMAL LOW (ref 30.0–36.0)
MCV: 100 fL (ref 80.0–100.0)
Platelets: 73 K/uL — ABNORMAL LOW (ref 150–400)
RBC: 3.71 MIL/uL — ABNORMAL LOW (ref 3.87–5.11)
RDW: 15.2 % (ref 11.5–15.5)
WBC: 14.2 K/uL — ABNORMAL HIGH (ref 4.0–10.5)
nRBC: 0.2 % (ref 0.0–0.2)

## 2023-08-21 MED ORDER — MYCOPHENOLATE MOFETIL 250 MG PO CAPS
1000.0000 mg | ORAL_CAPSULE | Freq: Two times a day (BID) | ORAL | Status: DC
  Administered 2023-08-21 – 2023-08-24 (×8): 1000 mg via ORAL
  Filled 2023-08-21 (×8): qty 4

## 2023-08-21 MED ORDER — MUPIROCIN CALCIUM 2 % EX CREA
TOPICAL_CREAM | Freq: Two times a day (BID) | CUTANEOUS | Status: DC
  Administered 2023-08-27 – 2023-08-29 (×2): 1 via TOPICAL
  Filled 2023-08-21 (×2): qty 15

## 2023-08-21 NOTE — Plan of Care (Signed)
  Problem: Education: Goal: Knowledge of General Education information will improve Description: Including pain rating scale, medication(s)/side effects and non-pharmacologic comfort measures Outcome: Progressing   Problem: Clinical Measurements: Goal: Ability to maintain clinical measurements within normal limits will improve Outcome: Progressing Goal: Will remain free from infection Outcome: Progressing Goal: Diagnostic test results will improve Outcome: Progressing Goal: Respiratory complications will improve Outcome: Progressing Goal: Cardiovascular complication will be avoided Outcome: Progressing   Problem: Nutrition: Goal: Adequate nutrition will be maintained Outcome: Progressing   Problem: Coping: Goal: Level of anxiety will decrease Outcome: Progressing   Problem: Elimination: Goal: Will not experience complications related to bowel motility Outcome: Progressing Goal: Will not experience complications related to urinary retention Outcome: Progressing   

## 2023-08-21 NOTE — Progress Notes (Signed)
 Progress Note   Patient: [name] FMW:[id number] DOB: 1965-03-26 DOA: 08/18/2023     3 DOS: the patient was seen and examined on 08/21/2023   Brief hospital course:  58 y.o. female with medical history significant for myasthenia gravis, moderate persistent asthma, chronic hypoxic and hypercarbic respiratory failure on 2 L O2 [place], history of PE no longer on anticoagulation, HTN, hypothyroidism, depression, morbid obesity, OSA on CPAP who presented to the ED for evaluation of shortness of breath.   Assessment and Plan:  Sepsis due to bilateral lower lobe possible bacterial pneumonia: Presenting with leukocytosis, tachycardia, tachypnea.  Imaging suspicious for bilateral lobe pneumonia.  COVID, influenza, RSV negative.  Lactic acidosis has resolved. - Continue ceftriaxone  and doxycycline  - Received IVF - Follow blood cultures-NGTD  - f/u strep pneumonia (negative) and Legionella urine antigens - Continue supplemental O2 as needed and wean to home 2 L [place] as able   Acute on chronic hypoxic and hypercapnic respiratory failure: In setting of pneumonia as above as well as OHS/OSA.  Patient uses 2 L O2 [place] at baseline, requiring 4 L O2 [place] on admission.  With history of myasthenia gravis, she is at increased risk for respiratory decompensation. - Continue supplemental O2 - NIF/VC q12h checks - Continue CPAP nightly   Elevated troponin/supply demand mismatch: Mild troponin elevation, downtrending on repeat.  Patient denies chest pain.  EKG without significant ST changes.  Likely demand ischemia in setting of sepsis and hypoxic respiratory failure.  Monitor on telemetry.   Splenic laceration and subcapsular hematoma: Patient told me she fell in the bathroom almost 2-3 months back. CT A/P showed indeterminate heterogeneous enhancement of the spleen with mild enlargement when compared to prior.  Splenic laceration and subcapsular fluid collections are possibility as well as infection.  - CTA  abdomen showed markedly abnormal spleen with splenic lacerations at mid pole and subcapsular hematoma - Appreciate general surgery recommendations -f/u H&H closely and avoid any anticoagulants. -May need IR intervention if she decompensates.  Possible ileus: She has nausea, vomiting and abdominal pain. Started on clear liquids on 8/2.  KUB done on 8/1 showed non obstructive bowel gas pattern.   Myasthenia gravis: Continue Mestinon  and CellCept .  Monitor NIF/VC as above.  PT/OT eval.   Moderate persistent asthma: No obvious wheezing on admission.  Continue Singulair  and albuterol  nebulizers as needed.   Hypertension: Continue losartan . Prn antihypertensives   Thrombocytopenia: New mild thrombocytopenia.  CTA abdomen to evaluate spleen as above.  Will hold blood thinners.   Hypothyroidism: Continue Synthroid .   OSA: Continue CPAP nightly.   Major moderate Depression: Continue Abilify  and Remeron .   Class III obesity: Estimated body mass index is 65.84 kg/m as calculated from the following:   Height as of 10/20/22: 5' 2 (1.575 m).   Weight as of 10/20/22: 163.3 kg.   She may be a candidate for anti-obesity meds as an out patient.  Bilateral feet pain and intermittent claudication symptoms: Ordered VAS US  ABI.      Subjective: She feels better today, slightly nauseous. Breathing feels better. Family members at bedside and they think that she looks better today. No chest pain or fever but still has abdominal soreness.  Physical Exam: Vitals:   08/21/23 0324 08/21/23 0700 08/21/23 0746 08/21/23 0753  BP: (!) 143/63  (!) 146/59   Pulse: 82  83 73  Resp: 18  (!) 22   Temp: 97.8 F (36.6 C) (P) 98.2 F (36.8 C) 98.2 F (36.8 C)  TempSrc: Axillary (P) Axillary Axillary   SpO2: 95%  100% 100%   Constitutional: alert and awake, looks comfortable, nasal oxygen  cannula in place Eyes: PERRL, lids and conjunctivae normal ENMT: Mucous membranes are moist. Posterior pharynx  clear of any exudate or lesions.Normal dentition.  Neck: normal, supple, no masses, no thyromegaly Respiratory: coarse breath sounds bilaterally Cardiovascular: Regular rate and rhythm, no murmurs / rubs / gallops. No extremity edema. 2+ pedal pulses. No carotid bruits.  Abdomen: tenderness to palpation diffusely, no masses palpated. No hepatosplenomegaly. Bowel sounds hypoactive.  Musculoskeletal: no clubbing / cyanosis. No joint deformity upper and lower extremities. Good ROM, no contractures. Normal muscle tone. Slight tenderness to palpation over bilateral feet Skin: no rashes, lesions, ulcers. No induration Neurologic: CN 2-12 grossly intact. Sensation intact, DTR normal. Strength 5/5 x all 4 extremities.  Psychiatric: Normal judgment and insight. Alert and oriented x 3. Normal mood.    Data Reviewed:  There are no new results to review at this time.  Family Communication: Multiple family members including sisters at the bedside  Disposition: Status is: Inpatient Remains inpatient appropriate because: Splenic laceration, ARF  Planned Discharge Destination: Home with Home Health    Time spent: 42 minutes  Author: Deliliah Room, MD 08/21/2023 10:36 AM  For on call review [email or web address].

## 2023-08-21 NOTE — Progress Notes (Signed)
 NIF -40 with good effort and technique

## 2023-08-21 NOTE — Progress Notes (Signed)
 NIF= -40 with great pt effort.

## 2023-08-21 NOTE — Progress Notes (Signed)
 Subjective: CC: Reports pain when her abdomen is palpated but otherwise comfortable.  No nausea this morning.  Objective: Vital signs in last 24 hours: Temp:  [97.6 F (36.4 C)-98.6 F (37 C)] 98.2 F (36.8 C) (08/02 0746) Pulse Rate:  [73-87] 73 (08/02 0753) Resp:  [18-23] 22 (08/02 0746) BP: (143-159)/(59-80) 146/59 (08/02 0746) SpO2:  [90 %-100 %] 100 % (08/02 0753) Last BM Date : 08/17/23  Intake/Output from previous day: 08/01 0701 - 08/02 0700 In: -  Out: 1050 [Urine:1050] Intake/Output this shift: No intake/output data recorded.  PE: Gen:  Alert, NAD, pleasant Abd: Soft, obese, mild distension, left sided ttp greatest in the LUQ without rigidity or guarding.   Lab Results:  Recent Labs    08/19/23 0212 08/19/23 1455 08/19/23 2149 08/20/23 0937  WBC 14.8*  --   --  18.6*  HGB 12.1   < > 13.2 11.6*  HCT 40.2   < > 43.9 38.1  PLT 106*  --   --  67*   < > = values in this interval not displayed.   BMET Recent Labs    08/19/23 0212 08/20/23 0937  NA 139 142  K 4.8 4.5  CL 99 99  CO2 32 33*  GLUCOSE 120* 110*  BUN 5* 9  CREATININE 0.55 0.51  CALCIUM  8.8* 8.9   PT/INR Recent Labs    08/18/23 1748 08/19/23 0212  LABPROT 16.2* 16.2*  INR 1.2 1.2   CMP     Component Value Date/Time   NA 142 08/20/2023 0937   K 4.5 08/20/2023 0937   CL 99 08/20/2023 0937   CO2 33 (H) 08/20/2023 0937   GLUCOSE 110 (H) 08/20/2023 0937   BUN 9 08/20/2023 0937   CREATININE 0.51 08/20/2023 0937   CALCIUM  8.9 08/20/2023 0937   PROT 6.9 08/19/2023 0212   ALBUMIN 3.0 (L) 08/19/2023 0212   AST 43 (H) 08/19/2023 0212   ALT 13 08/19/2023 0212   ALKPHOS 50 08/19/2023 0212   BILITOT 1.0 08/19/2023 0212   GFRNONAA >60 08/20/2023 0937   GFRAA >60 12/21/2018 1236   Lipase     Component Value Date/Time   LIPASE 25 09/09/2018 1416    Studies/Results: DG Abd 1 View Result Date: 08/20/2023 CLINICAL DATA:  [id number] Vomiting [id number] EXAM: ABDOMEN - 1 VIEW  COMPARISON:  None Available. FINDINGS: Nonobstructive bowel gas pattern. No pneumoperitoneum. Hepatomegaly. No radiopaque calculi visualized. No acute fracture or destructive lesion. The lung bases are clear. IMPRESSION: 1. Nonobstructive bowel gas pattern. 2. Hepatomegaly. Electronically Signed   By: Rogelia Myers M.D.   On: 08/20/2023 11:30    Anti-infectives: Anti-infectives (From admission, onward)    Start     Dose/Rate Route Frequency Ordered Stop   08/20/23 1400  doxycycline  (VIBRAMYCIN ) 100 mg in sodium chloride  0.9 % 250 mL IVPB        100 mg 125 mL/hr over 120 Minutes Intravenous Every 12 hours 08/20/23 1247     08/19/23 1800  cefTRIAXone  (ROCEPHIN ) 2 g in sodium chloride  0.9 % 100 mL IVPB        2 g 200 mL/hr over 30 Minutes Intravenous Every 24 hours 08/18/23 2101     08/18/23 2200  doxycycline  (VIBRA -TABS) tablet 100 mg  Status:  Discontinued        100 mg Oral Every 12 hours 08/18/23 2156 08/20/23 1247   08/18/23 1730  cefTRIAXone  (ROCEPHIN ) 2 g in sodium chloride  0.9 % 100 mL IVPB  2 g 200 mL/hr over 30 Minutes Intravenous Once 08/18/23 1726 08/18/23 1841        Assessment/Plan Non-traumatic splenic laceration  - CTA 7/30 w/ apparent splenic lacerations in the midpole region with surrounding subcapsular hematoma. No free fluid in the abdomen or pelvis. No active extravasation of contrast. - Hgb 11.6 yesterday morning from 12.1 the day prior.  Labs were not ordered for today- now pending. - HDS without tachycardia or hypotension on last vital check.  - No peritonitis on exam - No indication for emergency surgery. If significantly dropping hgb or develops hemodynamic changes, please let our team know so we can evaluate and provide updated recs-likely will recommend IR for embolization.  - Possible ileus from intra-abdominal hematoma. XR yesterday with non-obstructive pattern. Try clears today.    FEN - CLD, IVF per primary  VTE - SCDs, on hold for above ID -  Rocephin /Doxy per primary for PNA  I reviewed nursing notes, hospitalist notes, last 24 h vitals and pain scores, last 48 h intake and output, last 24 h labs and trends, and last 24 h imaging results.   LOS: 3 days    Mitzie DELENA Freund, MD [facility] Surgery 08/21/2023, 8:40 AM Please see Amion for pager number during day hours 7:00am-4:30pm

## 2023-08-22 DIAGNOSIS — J189 Pneumonia, unspecified organism: Secondary | ICD-10-CM | POA: Diagnosis not present

## 2023-08-22 DIAGNOSIS — A419 Sepsis, unspecified organism: Secondary | ICD-10-CM | POA: Diagnosis not present

## 2023-08-22 LAB — CBC WITH DIFFERENTIAL/PLATELET
Abs Immature Granulocytes: 0.05 K/uL (ref 0.00–0.07)
Basophils Absolute: 0.1 K/uL (ref 0.0–0.1)
Basophils Relative: 0 %
Eosinophils Absolute: 0.1 K/uL (ref 0.0–0.5)
Eosinophils Relative: 1 %
HCT: 36.5 % (ref 36.0–46.0)
Hemoglobin: 10.8 g/dL — ABNORMAL LOW (ref 12.0–15.0)
Immature Granulocytes: 0 %
Lymphocytes Relative: 11 %
Lymphs Abs: 1.4 K/uL (ref 0.7–4.0)
MCH: 29.6 pg (ref 26.0–34.0)
MCHC: 29.6 g/dL — ABNORMAL LOW (ref 30.0–36.0)
MCV: 100 fL (ref 80.0–100.0)
Monocytes Absolute: 1 K/uL (ref 0.1–1.0)
Monocytes Relative: 8 %
Neutro Abs: 9.6 K/uL — ABNORMAL HIGH (ref 1.7–7.7)
Neutrophils Relative %: 80 %
Platelets: 79 K/uL — ABNORMAL LOW (ref 150–400)
RBC: 3.65 MIL/uL — ABNORMAL LOW (ref 3.87–5.11)
RDW: 14.9 % (ref 11.5–15.5)
WBC: 12.2 K/uL — ABNORMAL HIGH (ref 4.0–10.5)
nRBC: 0.2 % (ref 0.0–0.2)

## 2023-08-22 LAB — MAGNESIUM: Magnesium: 2.1 mg/dL (ref 1.7–2.4)

## 2023-08-22 LAB — BASIC METABOLIC PANEL WITH GFR
Anion gap: 8 (ref 5–15)
BUN: 11 mg/dL (ref 6–20)
CO2: 36 mmol/L — ABNORMAL HIGH (ref 22–32)
Calcium: 9.2 mg/dL (ref 8.9–10.3)
Chloride: 99 mmol/L (ref 98–111)
Creatinine, Ser: 0.44 mg/dL (ref 0.44–1.00)
GFR, Estimated: >60 mL/min (ref >60)
Glucose, Bld: 91 mg/dL (ref 70–99)
Potassium: 4.3 mmol/L (ref 3.5–5.1)
Sodium: 143 mmol/L (ref 135–145)

## 2023-08-22 MED ORDER — BOOST / RESOURCE BREEZE PO LIQD CUSTOM
1.0000 | Freq: Three times a day (TID) | ORAL | Status: DC
  Administered 2023-08-22 – 2023-08-24 (×6): 1 via ORAL

## 2023-08-22 NOTE — Progress Notes (Signed)
 NIF -20 with good effort and technique

## 2023-08-22 NOTE — Progress Notes (Signed)
   08/22/23 1835  Patient Belongings  Patient/Family advised about valuables policy? Yes  Home Medications Sent to pharmacy in secure med bag  Patient Belongings None   Home medication, Wegovy, brought to unit by family. Will send down to pharmacy.

## 2023-08-22 NOTE — Progress Notes (Signed)
NIF -40 with good pt effort.  

## 2023-08-22 NOTE — Progress Notes (Signed)
 Subjective: CC: No acute change.  Reports pain is improving.  Denies nausea currently.  Objective: Vital signs in last 24 hours: Temp:  [97.9 F (36.6 C)-98.6 F (37 C)] 98.6 F (37 C) (08/03 0835) Pulse Rate:  [80-95] 89 (08/03 0835) Resp:  [18-25] 20 (08/03 0835) BP: (136-151)/(55-88) 145/55 (08/03 0835) SpO2:  [99 %-100 %] 100 % (08/03 0835) Last BM Date : 08/17/23  Intake/Output from previous day: 08/02 0701 - 08/03 0700 In: 500 [IV Piggyback:500] Out: 1600 [Urine:1600] Intake/Output this shift: No intake/output data recorded.  PE: Gen:  Alert, NAD, pleasant Abd: Soft, obese, minimal distension, left sided ttp greatest in the LUQ without rigidity or guarding-improved compared to yesterday.   Lab Results:  Recent Labs    08/21/23 1129 08/22/23 0310  WBC 14.2* 12.2*  HGB 11.0* 10.8*  HCT 37.1 36.5  PLT 73* 79*   BMET Recent Labs    08/21/23 1129 08/22/23 0310  NA 142 143  K 4.5 4.3  CL 98 99  CO2 35* 36*  GLUCOSE 104* 91  BUN 12 11  CREATININE 0.52 0.44  CALCIUM  9.4 9.2   PT/INR No results for input(s): LABPROT, INR in the last 72 hours.  CMP     Component Value Date/Time   NA 143 08/22/2023 0310   K 4.3 08/22/2023 0310   CL 99 08/22/2023 0310   CO2 36 (H) 08/22/2023 0310   GLUCOSE 91 08/22/2023 0310   BUN 11 08/22/2023 0310   CREATININE 0.44 08/22/2023 0310   CALCIUM  9.2 08/22/2023 0310   PROT 6.9 08/19/2023 0212   ALBUMIN 3.0 (L) 08/19/2023 0212   AST 43 (H) 08/19/2023 0212   ALT 13 08/19/2023 0212   ALKPHOS 50 08/19/2023 0212   BILITOT 1.0 08/19/2023 0212   GFRNONAA >60 08/22/2023 0310   GFRAA >60 12/21/2018 1236   Lipase     Component Value Date/Time   LIPASE 25 09/09/2018 1416    Studies/Results: DG Abd 1 View Result Date: 08/20/2023 CLINICAL DATA:  [id number] Vomiting [id number] EXAM: ABDOMEN - 1 VIEW COMPARISON:  None Available. FINDINGS: Nonobstructive bowel gas pattern. No pneumoperitoneum. Hepatomegaly. No radiopaque  calculi visualized. No acute fracture or destructive lesion. The lung bases are clear. IMPRESSION: 1. Nonobstructive bowel gas pattern. 2. Hepatomegaly. Electronically Signed   By: Rogelia Myers M.D.   On: 08/20/2023 11:30    Anti-infectives: Anti-infectives (From admission, onward)    Start     Dose/Rate Route Frequency Ordered Stop   08/20/23 1400  doxycycline  (VIBRAMYCIN ) 100 mg in sodium chloride  0.9 % 250 mL IVPB        100 mg 125 mL/hr over 120 Minutes Intravenous Every 12 hours 08/20/23 1247     08/19/23 1800  cefTRIAXone  (ROCEPHIN ) 2 g in sodium chloride  0.9 % 100 mL IVPB        2 g 200 mL/hr over 30 Minutes Intravenous Every 24 hours 08/18/23 2101     08/18/23 2200  doxycycline  (VIBRA -TABS) tablet 100 mg  Status:  Discontinued        100 mg Oral Every 12 hours 08/18/23 2156 08/20/23 1247   08/18/23 1730  cefTRIAXone  (ROCEPHIN ) 2 g in sodium chloride  0.9 % 100 mL IVPB        2 g 200 mL/hr over 30 Minutes Intravenous Once 08/18/23 1726 08/18/23 1841        Assessment/Plan Non-traumatic splenic laceration  - CTA 7/30 w/ apparent splenic lacerations in the midpole region with surrounding  subcapsular hematoma. No free fluid in the abdomen or pelvis. No active extravasation of contrast. - Hgb 10.8 today, stable from 11.0 yesterday - HDS without tachycardia or hypotension - No peritonitis on exam - No indication for emergency surgery. If significantly dropping hgb or develops hemodynamic changes, please let our team know so we can evaluate and provide updated recs-likely will recommend IR for embolization.  - P.o. intake not recorded yesterday, but appears to have had 1 bowel movement.  Will try advancing to full liquids today     FEN - FLD, IVF per primary  VTE - SCDs, on hold for above ID - Rocephin /Doxy per primary for PNA  I reviewed nursing notes, hospitalist notes, last 24 h vitals and pain scores, last 48 h intake and output, last 24 h labs and trends, and last 24 h  imaging results.   LOS: 4 days    Barbara DELENA Freund, MD [facility] Surgery 08/22/2023, 9:13 AM Please see Amion for pager number during day hours 7:00am-4:30pm

## 2023-08-22 NOTE — Progress Notes (Signed)
 Progress Note   Patient: [name] FMW:[id number] DOB: Mar 14, 1965 DOA: 08/18/2023     4 DOS: the patient was seen and examined on 08/22/2023   Brief hospital course:  58 y.o. female with medical history significant for myasthenia gravis, moderate persistent asthma, chronic hypoxic and hypercarbic respiratory failure on 2 L O2 [place], history of PE no longer on anticoagulation, HTN, hypothyroidism, depression, morbid obesity, OSA on CPAP who presented to the ED for evaluation of shortness of breath. Found to have splenic laceration and subcapsular hematoma and general surgery is on board. Hemodynamically stable.  Assessment and Plan:  Sepsis due to bilateral lower lobe possible bacterial pneumonia: Improving, leukocytosis improving. Presenting with leukocytosis, tachycardia, tachypnea.  Imaging suspicious for bilateral lobe pneumonia.  COVID, influenza, RSV negative.  Lactic acidosis has resolved. - Continue ceftriaxone  and doxycycline  - Received IVF - Follow blood cultures-NGTD  - f/u strep pneumonia (negative) and Legionella urine antigens - Continue supplemental O2 as needed and wean to home 2 L [place] as able   Acute on chronic hypoxic and hypercapnic respiratory failure: In setting of pneumonia as above as well as OHS/OSA.  Patient uses 2 L O2 [place] at baseline, requiring 4 L O2 [place] on admission.  With history of myasthenia gravis, she is at increased risk for respiratory decompensation. - Continue supplemental O2 - NIF/VC q12h checks - Continue CPAP nightly   Elevated troponin/supply demand mismatch: Mild troponin elevation, downtrending on repeat.  Patient denies chest pain.  EKG without significant ST changes.  Likely demand ischemia in setting of sepsis and hypoxic respiratory failure.  Monitor on telemetry.   Splenic laceration and subcapsular hematoma: Patient told me she fell in the bathroom almost 2-3 months back. CT A/P showed indeterminate heterogeneous enhancement of the spleen  with mild enlargement when compared to prior.  Splenic laceration and subcapsular fluid collections are possibility as well as infection.  - CTA abdomen showed markedly abnormal spleen with splenic lacerations at mid pole and subcapsular hematoma - Appreciate general surgery recommendations -f/u H&H closely and avoid any anticoagulants. -May need IR intervention if she decompensates.  Possible ileus: Improved.She has nausea, vomiting and abdominal pain. Started on clear liquids on 8/2. Advanced to a full liquid diet on 8/3. KUB done on 8/1 showed non obstructive bowel gas pattern.   Myasthenia gravis: Continue Mestinon  and CellCept .  Monitor NIF/VC, have been good.  PT/OT eval.   Moderate persistent asthma: No obvious wheezing on admission.  Continue Singulair  and albuterol  nebulizers as needed.   Hypertension: Continue losartan . Prn antihypertensives   Thrombocytopenia: New mild thrombocytopenia.  CTA abdomen to evaluate spleen as above.  Will hold blood thinners.   Hypothyroidism: Continue Synthroid .   OSA: Continue CPAP nightly.   Major moderate Depression: Continue Abilify  and Remeron .   Class III obesity: Estimated body mass index is 65.84 kg/m as calculated from the following:   Height as of 10/20/22: 5' 2 (1.575 m).   Weight as of 10/20/22: 163.3 kg.   She may be a candidate for anti-obesity meds as an out patient.  Bilateral feet pain and intermittent claudication symptoms: Ordered VAS US  ABI.      Subjective: She was wearing CPAP. Abdominal pain has improved and no episodes of vomiting in the past 24 hours. We discussed about advancing her diet to a full liquid diet today.  Physical Exam: Vitals:   08/22/23 0002 08/22/23 0408 08/22/23 0832 08/22/23 0835  BP: 139/60 136/88  (!) 145/55  Pulse: 92 82 89 89  Resp: 18 18 (!) 25 20  Temp: 97.9 F (36.6 C) 98.1 F (36.7 C)  98.6 F (37 C)  TempSrc: Axillary Axillary  Oral  SpO2: 100% 100%  100%    Constitutional: alert and awake, looks comfortable, CPAP in place Eyes: PERRL, lids and conjunctivae normal ENMT: Mucous membranes are moist. Posterior pharynx clear of any exudate or lesions.Normal dentition.  Neck: normal, supple, no masses, no thyromegaly Respiratory: coarse breath sounds bilaterally Cardiovascular: Regular rate and rhythm, no murmurs / rubs / gallops. No extremity edema. 2+ pedal pulses. No carotid bruits.  Abdomen: tenderness to palpation diffusely, no masses palpated. No hepatosplenomegaly. Bowel sounds hypoactive.  Musculoskeletal: no clubbing / cyanosis. No joint deformity upper and lower extremities. Good ROM, no contractures. Normal muscle tone. Slight tenderness to palpation over bilateral toes Skin: no rashes, lesions, ulcers. No induration Neurologic: CN 2-12 grossly intact. Sensation intact, DTR normal. Strength 5/5 x all 4 extremities.  Psychiatric: Normal judgment and insight. Alert and oriented x 3. Normal mood.    Data Reviewed:  There are no new results to review at this time.  Family Communication: None at the bedside  Disposition: Status is: Inpatient Remains inpatient appropriate because: Splenic laceration, ARF  Planned Discharge Destination: Home with Home Health    Time spent: 41 minutes  Author: Deliliah Room, MD 08/22/2023 8:53 AM  For on call review [email or web address].

## 2023-08-23 ENCOUNTER — Inpatient Hospital Stay (HOSPITAL_COMMUNITY)

## 2023-08-23 DIAGNOSIS — A419 Sepsis, unspecified organism: Secondary | ICD-10-CM | POA: Diagnosis not present

## 2023-08-23 DIAGNOSIS — M79606 Pain in leg, unspecified: Secondary | ICD-10-CM

## 2023-08-23 DIAGNOSIS — J189 Pneumonia, unspecified organism: Secondary | ICD-10-CM | POA: Diagnosis not present

## 2023-08-23 LAB — CULTURE, BLOOD (ROUTINE X 2)
Culture: NO GROWTH
Culture: NO GROWTH
Special Requests: ADEQUATE
Special Requests: ADEQUATE

## 2023-08-23 LAB — BASIC METABOLIC PANEL WITH GFR
Anion gap: 10 (ref 5–15)
BUN: 8 mg/dL (ref 6–20)
CO2: 35 mmol/L — ABNORMAL HIGH (ref 22–32)
Calcium: 9.2 mg/dL (ref 8.9–10.3)
Chloride: 100 mmol/L (ref 98–111)
Creatinine, Ser: 0.48 mg/dL (ref 0.44–1.00)
GFR, Estimated: >60 mL/min (ref >60)
Glucose, Bld: 95 mg/dL (ref 70–99)
Potassium: 5.4 mmol/L — ABNORMAL HIGH (ref 3.5–5.1)
Sodium: 145 mmol/L (ref 135–145)

## 2023-08-23 LAB — CBC WITH DIFFERENTIAL/PLATELET
Abs Granulocyte: 7.4 K/uL — ABNORMAL HIGH (ref 1.5–6.5)
Abs Immature Granulocytes: 0.04 K/uL (ref 0.00–0.07)
Basophils Absolute: 0 K/uL (ref 0.0–0.1)
Basophils Relative: 0 %
Eosinophils Absolute: 0.2 K/uL (ref 0.0–0.5)
Eosinophils Relative: 2 %
HCT: 37.9 % (ref 36.0–46.0)
Hemoglobin: 11.1 g/dL — ABNORMAL LOW (ref 12.0–15.0)
Immature Granulocytes: 0 %
Lymphocytes Relative: 9 %
Lymphs Abs: 0.9 K/uL (ref 0.7–4.0)
MCH: 29.5 pg (ref 26.0–34.0)
MCHC: 29.3 g/dL — ABNORMAL LOW (ref 30.0–36.0)
MCV: 100.8 fL — ABNORMAL HIGH (ref 80.0–100.0)
Monocytes Absolute: 0.8 K/uL (ref 0.1–1.0)
Monocytes Relative: 9 %
Neutro Abs: 7.4 K/uL (ref 1.7–7.7)
Neutrophils Relative %: 80 %
Platelets: 80 K/uL — ABNORMAL LOW (ref 150–400)
RBC: 3.76 MIL/uL — ABNORMAL LOW (ref 3.87–5.11)
RDW: 14.9 % (ref 11.5–15.5)
WBC: 9.3 K/uL (ref 4.0–10.5)
nRBC: 0 % (ref 0.0–0.2)

## 2023-08-23 MED ORDER — CALCIUM GLUCONATE-NACL 1-0.675 GM/50ML-% IV SOLN
1.0000 g | Freq: Once | INTRAVENOUS | Status: AC
  Administered 2023-08-23: 1000 mg via INTRAVENOUS
  Filled 2023-08-23: qty 50

## 2023-08-23 MED ORDER — ALBUTEROL SULFATE (2.5 MG/3ML) 0.083% IN NEBU
10.0000 mg | INHALATION_SOLUTION | Freq: Once | RESPIRATORY_TRACT | Status: AC
  Administered 2023-08-23: 10 mg via RESPIRATORY_TRACT
  Filled 2023-08-23: qty 12

## 2023-08-23 NOTE — Progress Notes (Signed)
 Subjective: CC: No acute events overnight. Patient reports pain is about the same as yesterday. Denies nausea currently but stated that she had one episode of non-bloody emesis this AM. Last BM was yesterday but she is having flatus today.   Objective: Vital signs in last 24 hours: Temp:  [97.9 F (36.6 C)-99.4 F (37.4 C)] 97.9 F (36.6 C) (08/04 0752) Pulse Rate:  [83-94] 83 (08/04 0752) Resp:  [20-31] 20 (08/04 0752) BP: (132-148)/(54-56) 137/56 (08/04 0752) SpO2:  [97 %-100 %] 100 % (08/04 0752) Last BM Date : 08/17/23  Intake/Output from previous day: 08/03 0701 - 08/04 0700 In: 1410.7 [IV Piggyback:1410.7] Out: 1550 [Urine:1550] Intake/Output this shift: No intake/output data recorded.  PE: Gen:  Alert, NAD, pleasant Abd: Soft, obese, minimal distension, left sided ttp greatest in the LUQ without rigidity or guarding-improved compared to yesterday.   Lab Results:  Recent Labs    08/21/23 1129 08/22/23 0310  WBC 14.2* 12.2*  HGB 11.0* 10.8*  HCT 37.1 36.5  PLT 73* 79*   BMET Recent Labs    08/21/23 1129 08/22/23 0310  NA 142 143  K 4.5 4.3  CL 98 99  CO2 35* 36*  GLUCOSE 104* 91  BUN 12 11  CREATININE 0.52 0.44  CALCIUM  9.4 9.2   PT/INR No results for input(s): LABPROT, INR in the last 72 hours.  CMP     Component Value Date/Time   NA 143 08/22/2023 0310   K 4.3 08/22/2023 0310   CL 99 08/22/2023 0310   CO2 36 (H) 08/22/2023 0310   GLUCOSE 91 08/22/2023 0310   BUN 11 08/22/2023 0310   CREATININE 0.44 08/22/2023 0310   CALCIUM  9.2 08/22/2023 0310   PROT 6.9 08/19/2023 0212   ALBUMIN 3.0 (L) 08/19/2023 0212   AST 43 (H) 08/19/2023 0212   ALT 13 08/19/2023 0212   ALKPHOS 50 08/19/2023 0212   BILITOT 1.0 08/19/2023 0212   GFRNONAA >60 08/22/2023 0310   GFRAA >60 12/21/2018 1236   Lipase     Component Value Date/Time   LIPASE 25 09/09/2018 1416    Studies/Results: No results found.   Anti-infectives: Anti-infectives  (From admission, onward)    Start     Dose/Rate Route Frequency Ordered Stop   08/20/23 1400  doxycycline  (VIBRAMYCIN ) 100 mg in sodium chloride  0.9 % 250 mL IVPB        100 mg 125 mL/hr over 120 Minutes Intravenous Every 12 hours 08/20/23 1247     08/19/23 1800  cefTRIAXone  (ROCEPHIN ) 2 g in sodium chloride  0.9 % 100 mL IVPB        2 g 200 mL/hr over 30 Minutes Intravenous Every 24 hours 08/18/23 2101     08/18/23 2200  doxycycline  (VIBRA -TABS) tablet 100 mg  Status:  Discontinued        100 mg Oral Every 12 hours 08/18/23 2156 08/20/23 1247   08/18/23 1730  cefTRIAXone  (ROCEPHIN ) 2 g in sodium chloride  0.9 % 100 mL IVPB        2 g 200 mL/hr over 30 Minutes Intravenous Once 08/18/23 1726 08/18/23 1841        Assessment/Plan Non-traumatic splenic laceration   - CTA 7/30 w/ apparent splenic lacerations in the midpole region with surrounding subcapsular hematoma. No free fluid in the abdomen or pelvis. No active extravasation of contrast. - Hgb stable at 10.8 yesterday, CBC pending for today. - HDS without tachycardia or hypotension - No peritonitis noted on exam -  No indication for emergency surgery. If significantly dropping hgb or develops hemodynamic changes, please let our team know so we can evaluate and provide updated recs-likely will recommend IR for embolization.  -Continue FLD.  -Patient was encouraged to mobilize since has not been OOB.   FEN - FLD, IVF per primary  VTE - SCDs, on hold for above ID - Rocephin /Doxy per primary for PNA  I reviewed nursing notes, hospitalist notes, last 24 h vitals and pain scores, last 48 h intake and output, last 24 h labs and trends, and last 24 h imaging results.   LOS: 5 days    Eulah Hammonds, [facility] Surgery 08/23/2023, 8:23 AM Please see Amion for pager number during day hours 7:00am-4:30pm

## 2023-08-23 NOTE — Progress Notes (Addendum)
 Progress Note   Patient: [name] FMW:[id number] DOB: [birth year] DOA: 08/18/2023     5 DOS: the patient was seen and examined on 08/23/2023   Brief hospital course:  58 y.o. female with medical history significant for myasthenia gravis, moderate persistent asthma, chronic hypoxic and hypercarbic respiratory failure on 2 L O2 [place], history of PE no longer on anticoagulation, HTN, hypothyroidism, depression, morbid obesity, OSA on CPAP who presented to the ED for evaluation of shortness of breath. Found to have splenic laceration and subcapsular hematoma and general surgery is on board. Hemodynamically stable.  Assessment and Plan:  Sepsis due to bilateral lower lobe possible bacterial pneumonia: Improving, leukocytosis reoslved. Presenting with leukocytosis, tachycardia, tachypnea.  Imaging suspicious for bilateral lobe pneumonia.  COVID, influenza, RSV negative.  Lactic acidosis has resolved. - Continue ceftriaxone  and doxycycline  - Received IVF - Follow blood cultures-NGTD  - f/u strep pneumonia (negative) and Legionella urine antigens - Continue supplemental O2 as needed and wean to home 2 L [place] as able   Acute on chronic hypoxic and hypercapnic respiratory failure: In setting of pneumonia as above as well as OHS/OSA.  Patient uses 2 L O2 [place] at baseline, requiring 4 L O2 [place] on admission.  With history of myasthenia gravis, she is at increased risk for respiratory decompensation. - Continue supplemental O2 - NIF/VC q12h checks - Continue CPAP nightly  Hyperkalemia: Ordered calcium  gluconate as well as albuterol  inhalation. No evidence of AKI. She is on losartan . Monitor on tele closely.    Elevated troponin/supply demand mismatch: Mild troponin elevation, downtrending on repeat.  Patient denies chest pain.  EKG without significant ST changes.  Likely demand ischemia in setting of sepsis and hypoxic respiratory failure.  Monitor on telemetry.   Splenic laceration and subcapsular  hematoma: Patient told me she fell in the bathroom almost 2-3 months back. CT A/P showed indeterminate heterogeneous enhancement of the spleen with mild enlargement when compared to prior.  Splenic laceration and subcapsular fluid collections are possibility as well as infection.  - CTA abdomen showed markedly abnormal spleen with splenic lacerations at mid pole and subcapsular hematoma - Appreciate general surgery recommendations -f/u H&H closely and avoid any anticoagulants. -May need IR intervention if she decompensates.  Possible ileus: Improved.She has nausea, vomiting and abdominal pain. Started on clear liquids on 8/2. Advanced to a full liquid diet on 8/3 and then to a soft diet on 8/4. KUB done on 8/1 showed non obstructive bowel gas pattern.   Myasthenia gravis: Continue Mestinon  and CellCept .  Monitor NIF/VC, have been good.  PT/OT eval.   Moderate persistent asthma: No obvious wheezing on admission.  Continue Singulair  and albuterol  nebulizers as needed.   Hypertension: Continue losartan . Prn antihypertensives   Thrombocytopenia: New mild thrombocytopenia.  CTA abdomen to evaluate spleen as above.  Will hold blood thinners.   Hypothyroidism: Continue Synthroid .   OSA: Continue CPAP nightly.   Major moderate Depression: Continue Abilify  and Remeron .   Class III obesity: Estimated body mass index is 65.84 kg/m as calculated from the following:   Height as of 10/20/22: 5' 2 (1.575 m).   Weight as of 10/20/22: 163.3 kg.   On wegovy  Bilateral feet pain and intermittent claudication symptoms: Ordered VAS US  ABI.  Disposition: Home with HHS. Lives with her sister.      Subjective: She was wearing CPAP. Abdominal pain has improved and no episodes of vomiting in the past 24 hours. We discussed about advancing her diet to a full  liquid diet today.  Physical Exam: Vitals:   08/22/23 2215 08/22/23 2344 08/23/23 0750 08/23/23 0752  BP:  (!) 132/54  (!) 137/56   Pulse: 94 94  83  Resp: (!) 31 (!) 22  20  Temp:  99.4 F (37.4 C)  97.9 F (36.6 C)  TempSrc:  Oral  Oral  SpO2: 100% 97% 98% 100%   Constitutional: alert and awake, looks comfortable, nasal oxygen in place Eyes: PERRL, lids and conjunctivae normal ENMT: Mucous membranes are moist. Posterior pharynx clear of any exudate or lesions.Normal dentition.  Neck: normal, supple, no masses, no thyromegaly Respiratory: coarse breath sounds bilaterally Cardiovascular: Regular rate and rhythm, no murmurs / rubs / gallops. No extremity edema. 2+ pedal pulses. No carotid bruits.  Abdomen: tenderness to palpation diffusely, no masses palpated. No hepatosplenomegaly. Bowel sounds hypoactive.  Musculoskeletal: no clubbing / cyanosis. No joint deformity upper and lower extremities. Good ROM, no contractures. Normal muscle tone. Slight tenderness to palpation over bilateral toes Skin: no rashes, lesions, ulcers. No induration Neurologic: CN 2-12 grossly intact. Sensation intact, DTR normal. Strength 5/5 x all 4 extremities.  Psychiatric: Normal judgment and insight. Alert and oriented x 3. Normal mood.    Data Reviewed:  There are no new results to review at this time.  Family Communication: None at the bedside  Disposition: Status is: Inpatient Remains inpatient appropriate because: Splenic laceration, ARF  Planned Discharge Destination: Home with Home Health    Time spent: 41 minutes  Author: Deliliah Room, MD 08/23/2023 9:38 AM  For on call review [email or web address].

## 2023-08-23 NOTE — Progress Notes (Signed)
 NIF -30 with good effort and technique

## 2023-08-23 NOTE — NC FL2 (Signed)
 [place]  MEDICAID FL2 LEVEL OF CARE FORM     IDENTIFICATION  Patient Name: [name] Birthdate: [birth year] Sex: female Admission Date (Current Location): 08/18/2023  [facility] and [place] Number:  Producer, television/film/video and Address:  The . [facility], [address], [place], [place] [address]      Provider Number: [id number]  Attending Physician Name and Address:  Dino Antu, MD  Relative Name and Phone Number:       Current Level of Care: Hospital Recommended Level of Care: Skilled Nursing Facility Prior Approval Number:    Date Approved/Denied:   PASRR Number: [id number]  Discharge Plan: SNF    Current Diagnoses: Patient Active Problem List   Diagnosis Date Noted   Sepsis due to pneumonia (HCC) 08/18/2023   Acute on chronic respiratory failure with hypoxia and hypercapnia (HCC) 08/18/2023   Elevated troponin 08/18/2023   Thrombocytopenia (HCC) 08/18/2023   Moderate persistent asthma 08/18/2023   Splenic laceration, initial encounter 08/18/2023   History of pulmonary embolus (PE) 11/21/2018   Pulmonary emboli (HCC) 08/19/2016   Asthma exacerbation 08/16/2016   Depression 06/20/2014   Anxiety 06/20/2014   Sleep apnea 06/20/2014   GERD (gastroesophageal reflux disease) 12/28/2012   Hypothyroidism 12/31/1999   Morbid obesity (HCC) 03/02/1994   Myasthenia gravis (HCC) 09/10/1992    Orientation RESPIRATION BLADDER Height & Weight     Self, Time, Situation, Place  O2 (wears CPAP) Incontinent Weight:   Height:  5' (152.4 cm)  BEHAVIORAL SYMPTOMS/MOOD NEUROLOGICAL BOWEL NUTRITION STATUS      Incontinent Diet (please see discharge summary)  AMBULATORY STATUS COMMUNICATION OF NEEDS Skin   Extensive Assist   Normal                       Personal Care Assistance Level of Assistance  Feeding, Bathing, Dressing Bathing Assistance: Maximum assistance Feeding assistance: Limited assistance Dressing Assistance: Maximum assistance      Functional Limitations Info  Sight, Hearing, Speech Sight Info: Impaired Hearing Info: Adequate Speech Info: Adequate    SPECIAL CARE FACTORS FREQUENCY  PT (By licensed PT), OT (By licensed OT)     PT Frequency: 5x per week OT Frequency: 5x per week            Contractures Contractures Info: Not present    Additional Factors Info  Code Status, Allergies Code Status Info: FULL Allergies Info: NKA           Current Medications (08/23/2023):  This is the current hospital active medication list Current Facility-Administered Medications  Medication Dose Route Frequency Provider Last Rate Last Admin   acetaminophen  (TYLENOL ) tablet 650 mg  650 mg Oral Q6H PRN Patel, Vishal R, MD   650 mg at 08/23/23 1215   Or   acetaminophen  (TYLENOL ) suppository 650 mg  650 mg Rectal Q6H PRN Patel, Vishal R, MD       albuterol  (PROVENTIL ) (2.5 MG/3ML) 0.083% nebulizer solution 2.5 mg  2.5 mg Nebulization Q6H PRN Patel, Vishal R, MD       ARIPiprazole  (ABILIFY ) tablet 2 mg  2 mg Oral Daily Patel, Vishal R, MD   2 mg at 08/23/23 1036   budesonide  (PULMICORT ) nebulizer solution 0.25 mg  0.25 mg Nebulization BID Rashid, Farhan, MD   0.25 mg at 08/23/23 0748   cefTRIAXone  (ROCEPHIN ) 2 g in sodium chloride  0.9 % 100 mL IVPB  2 g Intravenous Q24H Patel, Vishal R, MD 200 mL/hr at 08/22/23 1714 2 g  at 08/22/23 1714   doxycycline  (VIBRAMYCIN ) 100 mg in sodium chloride  0.9 % 250 mL IVPB  100 mg Intravenous Q12H Rashid, Farhan, MD 125 mL/hr at 08/23/23 1409 100 mg at 08/23/23 1409   famotidine  (PEPCID ) IVPB 20 mg premix  20 mg Intravenous Q12H Rashid, Farhan, MD 100 mL/hr at 08/23/23 1219 20 mg at 08/23/23 1219   feeding supplement (BOOST / RESOURCE BREEZE) liquid 1 Container  1 Container Oral TID BM Signe Mitzie LABOR, MD   1 Container at 08/23/23 1030   guaiFENesin  (MUCINEX ) 12 hr tablet 600 mg  600 mg Oral BID Patel, Vishal R, MD   600 mg at 08/23/23 1034   hydrALAZINE  (APRESOLINE ) injection 10 mg  10  mg Intravenous Q6H PRN Rashid, Farhan, MD       levothyroxine  (SYNTHROID ) tablet 200 mcg  200 mcg Oral QAC breakfast Patel, Vishal R, MD   200 mcg at 08/23/23 0606   losartan  (COZAAR ) tablet 50 mg  50 mg Oral Daily Patel, Vishal R, MD   50 mg at 08/23/23 1034   mirtazapine  (REMERON ) tablet 15 mg  15 mg Oral QHS Patel, Vishal R, MD   15 mg at 08/22/23 2239   montelukast  (SINGULAIR ) tablet 10 mg  10 mg Oral QHS Patel, Vishal R, MD   10 mg at 08/22/23 2239   mupirocin  cream (BACTROBAN ) 2 %   Topical BID Rashid, Farhan, MD   Given at 08/23/23 1222   mycophenolate  (CELLCEPT ) capsule 1,000 mg  1,000 mg Oral BID Rashid, Farhan, MD   1,000 mg at 08/23/23 1033   ondansetron  (ZOFRAN ) tablet 4 mg  4 mg Oral Q6H PRN Patel, Vishal R, MD       Or   ondansetron  (ZOFRAN ) injection 4 mg  4 mg Intravenous Q6H PRN Patel, Vishal R, MD   4 mg at 08/23/23 0542   pregabalin  (LYRICA ) capsule 200 mg  200 mg Oral BID Patel, Vishal R, MD   200 mg at 08/23/23 1034   pyridostigmine  (MESTINON ) tablet 30 mg  30 mg Oral 5 X Daily Patel, Vishal R, MD   30 mg at 08/23/23 1034   senna-docusate (Senokot-S) tablet 1 tablet  1 tablet Oral QHS PRN Patel, Vishal R, MD       sodium chloride  flush (NS) 0.9 % injection 3 mL  3 mL Intravenous Q12H Patel, Vishal R, MD   3 mL at 08/23/23 1000     Discharge Medications: Please see discharge summary for a list of discharge medications.  Relevant Imaging Results:  Relevant Lab Results:   Additional Information SSN [id number]  Montie LOISE Louder, LCSW

## 2023-08-23 NOTE — Progress Notes (Signed)
 Physical Therapy Treatment Patient Details Name: [name] MRN: [id number] DOB: 1965-01-21 Today's Date: 08/23/2023   History of Present Illness [name] is a 58 y.o. female who presented with worsening SOB. Found to have PNA and CTA 7/30 w/ apparent splenic lacerations in the midpole region with surrounding subcapsular hematoma. No free fluid in the abdomen or pelvis. No active extravasation of contrast. PMHx: obesity, depression, asthma, OSA, myasthenia gravis, chronic hypoxic hypercarbic respiratory failure on 2 L O2, remote history of PE (not on anticoagulation), HTN, hypothyroidism    PT Comments  Pt received in supine, agreeable to therapy session after breathing treatment, pt with improved tolerance for therapy sessio this date and able to perform transfer to edge of bed, although pt hypoxic on 4L HF [place] after transfer to EOB, needing increase to 8L HF [place] and humidified wall O2 to return to 92% and greater during seated/standing activity. Pt performed transfers x2 from EOB with +2 HHA and able to stand ~1 minute x2 trials while soiled linens removed and clean sheets placed. Pt unable to weight shift in stance or take steps toward HOB/chair due to dypsnea on exertion and c/o BLE fatigue/weakness. Pt agrees she is not currently at a level where she could walk into home or get OOB with +1 assist, so pt agreeable to consider post-acute rehab when discussed during collaborative PT/OT session, case mgmt and social worker notified. Patient will benefit from continued inpatient follow up therapy, <3 hours/day.   If plan is discharge home, recommend the following: Two people to help with walking and/or transfers;Assistance with cooking/housework;Help with stairs or ramp for entrance;Assist for transportation   Can travel by private vehicle     No  Equipment Recommendations  Wheelchair cushion (measurements PT);Wheelchair (measurements PT);Hospital bed (may need hoyer lift pending  progress; bariatric sized equipment, height adjustable to youth height (pt reports 50ft 0 height))    Recommendations for Other Services       Precautions / Restrictions Precautions Precautions: Fall Recall of Precautions/Restrictions: Impaired Precaution/Restrictions Comments: increased O2 needs with upright posture Restrictions Weight Bearing Restrictions Per Provider Order: No     Mobility  Bed Mobility Overal bed mobility: Needs Assistance Bed Mobility: Sit to Supine, Supine to Sit     Supine to sit: Mod assist, +2 for physical assistance, +2 for safety/equipment Sit to supine: Max assist, +2 for physical assistance, +2 for safety/equipment   General bed mobility comments: required simple 1 step cues to sequence bed mobility, to R EOB, heavy use of bed features/rail, posterior lean upon sitting upright needing constant trunk support vs rails x2 to maintain seated balance; initially, pt desat on 4L HF [place] with transition to EOB despite max cues for posture/pursed lip breathing unable to improve >74%, so transferred to closer portable tank wtih HFNC at 8L/min, but still remains <77%, so brougth back to humidified wall air at 8L HF [place] and gradually improved to 92% and greater after a few mins.    Transfers Overall transfer level: Needs assistance Equipment used: 2 person hand held assist Transfers: Sit to/from Stand Sit to Stand: Mod assist, +2 safety/equipment, +2 physical assistance, From elevated surface          Lateral/Scoot Transfers: Total assist General transfer comment: slightly elevated bed<>HHA x2 trials, pt bed linens under her were wet so on second attempt, linens removed/replaced under her hips; SpO2 WFL on 6L HF [place] while seated/standing. Pt encouraged to scoot toward [facility] and attempt multiple times seated but  unable to achieve propulsion on her own. pt then transferred to supine with +3 for safety and totalA to scoot toward [facility] in trend with +3 assist totalA     Ambulation/Gait           Gait velocity interpretation: <1.31 ft/sec, indicative of household ambulator Pre-gait activities: Attempted to have pt take lateral steps toward [facility], pt unable to offload each LE to march or take shuffled steps, pt sits impulsively due to fatigue/BLE weakness.     Stairs             Wheelchair Mobility     Tilt Bed    Modified Rankin (Stroke Patients Only)       Balance Overall balance assessment: Needs assistance Sitting-balance support: Feet supported Sitting balance-Leahy Scale: Poor Sitting balance - Comments: variable modA to CGA with 1-2 UE support, needed multiple minutes seated prior to progressing to CGA.   Standing balance support: Bilateral upper extremity supported, During functional activity Standing balance-Leahy Scale: Poor Standing balance comment: +2 assist to maintain standing, <2 minutes each trial.                            Communication Communication Communication: No apparent difficulties Factors Affecting Communication: Difficulty expressing self (while seated)  Cognition Arousal: Alert Behavior During Therapy: Anxious   PT - Cognitive impairments: Sequencing, Problem solving, Attention                       PT - Cognition Comments: Some difficulty following instructions for pursed-lip breathing when anxious/internally distracted due to discomfort, pt with posterior lean seated but needs frequent reminders for posture to reduce instability while seated; Pt appears more oriented this date and now able to agree that she's not close to her typical physical baseline and is agreeing to look into her options for post-acute short term rehab once she realized she was unable to ambulate. Following commands: Impaired Following commands impaired: Follows one step commands with increased time    Cueing Cueing Techniques: Verbal cues, Gestural cues, Tactile cues  Exercises Other Exercises Other  Exercises: seated BLE AROM: toe raises x5 reps ea Other Exercises: supine BLE AROM: ankle pumps x5 reps ea Other Exercises: IS x 5 reps pt achieves ~100-242mL at most. Encouraged hourly use but with rest breaks to recover. Other Exercises: STS x 2 reps with HHA for BLE strengthening Other Exercises: seated pursed-lip breathing x10 reps x 2 sets    General Comments General comments (skin integrity, edema, etc.): pt hypoxic to 74% on 4L HFNC after sitting EOB ~3 mins so switched to 8L to portable O2 tank which was closer, but pt unable to recover. Placed back on wall O2 at 8L (high flow tubing) and pt quickly recovered >92%. Titrated back down to 4L by the end of the session once back in supine. HR 90's bpm at rest, >100 bpm while seated.      Pertinent Vitals/Pain Pain Assessment Pain Assessment: Faces Faces Pain Scale: Hurts even more Pain Location: stomach & HA Pain Descriptors / Indicators: Grimacing, Guarding, Discomfort, Throbbing, Sharp Pain Intervention(s): Limited activity within patient's tolerance, Monitored during session, Repositioned, Patient requesting pain meds-RN notified    Home Living                          Prior Function            PT  Goals (current goals can now be found in the care plan section) Acute Rehab PT Goals Patient Stated Goal: to improve mobility PT Goal Formulation: With patient Time For Goal Achievement: 09/02/23 Progress towards PT goals: Progressing toward goals    Frequency    Min 2X/week      PT Plan      Co-evaluation PT/OT/SLP Co-Evaluation/Treatment: Yes Reason for Co-Treatment: Complexity of the patient's impairments (multi-system involvement);For patient/therapist safety;To address functional/ADL transfers PT goals addressed during session: Mobility/safety with mobility;Balance;Strengthening/ROM OT goals addressed during session: ADL's and self-care      AM-PAC PT 6 Clicks Mobility   Outcome Measure  Help  needed turning from your back to your side while in a flat bed without using bedrails?: A Lot Help needed moving from lying on your back to sitting on the side of a flat bed without using bedrails?: A Lot Help needed moving to and from a bed to a chair (including a wheelchair)?: Total Help needed standing up from a chair using your arms (e.g., wheelchair or bedside chair)?: Total (+2 assist) Help needed to walk in hospital room?: Total Help needed climbing 3-5 steps with a railing? : Total 6 Click Score: 8    End of Session Equipment Utilized During Treatment: Gait belt;Oxygen (HFNC) Activity Tolerance: Patient limited by fatigue;Treatment limited secondary to medical complications (Comment);Other (comment) (hypoxia with minimal exertion (sitting)) Patient left: in bed;with call bell/phone within reach;with bed alarm set;Other (comment) (bed in chair posture with HOB >40 deg to promote tolerance for upright and pulmonary clearance.) Nurse Communication: Mobility status;Other (comment) (pt purewick was leaking; not able to take pivotal steps to chair today.) PT Visit Diagnosis: Other abnormalities of gait and mobility (R26.89);Muscle weakness (generalized) (M62.81)     Time: 8861-8782 PT Time Calculation (min) (ACUTE ONLY): 39 min  Charges:    $Therapeutic Exercise: 8-22 mins PT General Charges $$ ACUTE PT VISIT: 1 Visit                     Shandy Vi P., PTA Acute Rehabilitation Services Secure Chat Preferred 9a-5:30pm Office: [phone]    Connell HERO [facility] 08/23/2023, 2:08 PM

## 2023-08-23 NOTE — Progress Notes (Signed)
 Occupational Therapy Treatment Patient Details Name: [name] MRN: [id number] DOB: 11-20-1965 Today's Date: 08/23/2023   History of present illness [name] is an 58 y.o. female who presented with worsening SOB. Found to have PNA and  splenic laceration versus heterogeneous enhancement. PMHx: obesity, depression, asthma, OSA, myasthenia gravis, chronic hypoxic hypercarbic respiratory failure on 2 L O2, remote history of PE (not on anticoagulation), HTN, hypothyroidism   OT comments  Pt is making limited progress towards their acute OT goals. Pt seen with PT in attempt to progress OOB. Pt required mod A +2 for bed mobility with increased time and step by step cues. Once sitting EOB, pt desaturated to 78% and was unable to coordinate PLB technique. Once her O2 recovered, she tolerated 2x STS with mod A +2 but was unable to progress to pre-gait activities or tranfers to the chair. Pt soiled with urine on arrival, min A for UB ADLs and linens changed when standing. OT to continue to follow acutely to facilitate progress towards established goals. Pt will continue to benefit from skilled inpatient follow up therapy, <3 hours/day.       If plan is discharge home, recommend the following:  A lot of help with walking and/or transfers;Two people to help with walking and/or transfers;Assistance with cooking/housework;A lot of help with bathing/dressing/bathroom;Two people to help with bathing/dressing/bathroom;Direct supervision/assist for medications management;Assist for transportation;Help with stairs or ramp for entrance   Equipment Recommendations  None recommended by OT       Precautions / Restrictions Precautions Precautions: Fall Precaution/Restrictions Comments: 4L Restrictions Weight Bearing Restrictions Per Provider Order: No       Mobility Bed Mobility Overal bed mobility: Needs Assistance Bed Mobility: Sit to Supine, Supine to Sit     Supine to sit: Mod assist,  +2 for physical assistance, +2 for safety/equipment Sit to supine: Max assist, +2 for physical assistance, +2 for safety/equipment   General bed mobility comments: required simple 1 step cues to sequence bed mobility    Transfers Overall transfer level: Needs assistance Equipment used: 2 person hand held assist Transfers: Sit to/from Stand Sit to Stand: Mod assist, +2 safety/equipment, +2 physical assistance           General transfer comment: x2, second attempt was much better than first     Balance Overall balance assessment: Needs assistance Sitting-balance support: Feet supported Sitting balance-Leahy Scale: Fair     Standing balance support: Bilateral upper extremity supported, During functional activity Standing balance-Leahy Scale: Poor                             ADL either performed or assessed with clinical judgement   ADL Overall ADL's : Needs assistance/impaired                 Upper Body Dressing : Minimal assistance       Toilet Transfer: Total assistance;+2 for safety/equipment;+2 for physical assistance Toilet Transfer Details (indicate cue type and reason): simulated. Able to STS with mod A +2, unable to progress to stepping Toileting- Clothing Manipulation and Hygiene: Total assistance;+2 for physical assistance;+2 for safety/equipment;Sit to/from stand       Functional mobility during ADLs: Moderate assistance;+2 for physical assistance;+2 for safety/equipment General ADL Comments: limited by weakness, desaturation and poor activity tolerance    Extremity/Trunk Assessment Upper Extremity Assessment Upper Extremity Assessment: Generalized weakness   Lower Extremity Assessment Lower Extremity Assessment: Defer to PT evaluation  Vision   Vision Assessment?: Wears glasses for reading   Perception Perception Perception: Not tested   Praxis Praxis Praxis: Not tested   Communication Communication Communication: No  apparent difficulties   Cognition Arousal: Alert Behavior During Therapy: Anxious Cognition: No family/caregiver present to determine baseline             OT - Cognition Comments: falt affect, needed one step simple commands. Unable to follow multi-step diectional tasks. Pt reported feeling anxious and scared                 Following commands: Impaired Following commands impaired: Follows one step commands with increased time      Cueing   Cueing Techniques: Verbal cues        General Comments pt desatted to 78% on 8L to portable O2. unable to recover. Placed back on wall )2 at 6L (high flow tubing) and pt quickly recovered >92%. Titrated back down to 4L by the end of the session    Pertinent Vitals/ Pain       Pain Assessment Pain Assessment: Faces Faces Pain Scale: Hurts even more Pain Location: stomach & HA Pain Descriptors / Indicators: Grimacing, Guarding Pain Intervention(s): Limited activity within patient's tolerance, Monitored during session   Frequency  Min 2X/week        Progress Toward Goals  OT Goals(current goals can now be found in the care plan section)  Progress towards OT goals: Progressing toward goals  Acute Rehab OT Goals Patient Stated Goal: to get better OT Goal Formulation: With patient Time For Goal Achievement: 09/02/23 Potential to Achieve Goals: Good ADL Goals Pt Will Perform Grooming: with supervision;standing Pt Will Perform Lower Body Dressing: with supervision;sit to/from stand Pt Will Transfer to Toilet: with supervision;ambulating Additional ADL Goal #1: Pt will complete bed mobility with supervision A as a precursor to ADLs  Plan      Co-evaluation    PT/OT/SLP Co-Evaluation/Treatment: Yes Reason for Co-Treatment: Complexity of the patient's impairments (multi-system involvement);For patient/therapist safety;To address functional/ADL transfers   OT goals addressed during session: ADL's and self-care       AM-PAC OT 6 Clicks Daily Activity     Outcome Measure   Help from another person eating meals?: A Little Help from another person taking care of personal grooming?: A Little Help from another person toileting, which includes using toliet, bedpan, or urinal?: Total Help from another person bathing (including washing, rinsing, drying)?: A Lot Help from another person to put on and taking off regular upper body clothing?: A Little Help from another person to put on and taking off regular lower body clothing?: Total 6 Click Score: 13    End of Session Equipment Utilized During Treatment: Oxygen  OT Visit Diagnosis: Unsteadiness on feet (R26.81);Muscle weakness (generalized) (M62.81)   Activity Tolerance Patient tolerated treatment well   Patient Left in bed;with call bell/phone within reach;with bed alarm set;with nursing/sitter in room   Nurse Communication Mobility status (O2)        Time: 8861-8782 OT Time Calculation (min): 39 min  Charges: OT General Charges $OT Visit: 1 Visit OT Treatments $Self Care/Home Management : 23-37 mins  Lucie Kendall, OTR/L Acute Rehabilitation Services Office [phone] Secure Chat Communication Preferred   Lucie JONETTA Kendall 08/23/2023, 12:54 PM

## 2023-08-23 NOTE — Progress Notes (Signed)
NIF -40 with good patient effort.  

## 2023-08-23 NOTE — TOC Progression Note (Signed)
 Transition of Care [facility]) - Progression Note    Patient Details  Name: [name] MRN: [id number] Date of Birth: 1966/01/15  Transition of Care [facility]) CM/SW Contact  Montie LOISE Louder, [place] Phone Number: 08/23/2023, 2:41 PM  Clinical Narrative:     11:50 am - received message from PT- now, patient is interested in short term rehab at [facility]. CSW sent our referrals for rehab.   TOC will provide bed offers once available   Montie Louder, MSW, LCSW Clinical Social Worker    Expected Discharge Plan: Home w Home Health Services Barriers to Discharge: Continued Medical Work up               Expected Discharge Plan and Services In-house Referral: Clinical Social Work                                             Social Drivers of Health (SDOH) Interventions SDOH Screenings   Food Insecurity: No Food Insecurity (08/19/2023)  Housing: Low Risk  (08/19/2023)  Transportation Needs: No Transportation Needs (08/19/2023)  Utilities: Not At Risk (08/19/2023)  Tobacco Use: Medium Risk (08/18/2023)  Health Literacy: High Risk (04/27/2020)   Received from [facility]    Readmission Risk Interventions     No data to display

## 2023-08-23 NOTE — Progress Notes (Signed)
 VASCULAR LAB    ABI has been performed.  See CV proc for preliminary results.   Sharmaine Bain, RVT 08/23/2023, 1:09 PM

## 2023-08-24 DIAGNOSIS — J189 Pneumonia, unspecified organism: Secondary | ICD-10-CM | POA: Diagnosis not present

## 2023-08-24 DIAGNOSIS — A419 Sepsis, unspecified organism: Secondary | ICD-10-CM | POA: Diagnosis not present

## 2023-08-24 LAB — CBC WITH DIFFERENTIAL/PLATELET
Abs Immature Granulocytes: 0.02 K/uL (ref 0.00–0.07)
Basophils Absolute: 0 K/uL (ref 0.0–0.1)
Basophils Relative: 0 %
Eosinophils Absolute: 0.2 K/uL (ref 0.0–0.5)
Eosinophils Relative: 2 %
HCT: 36.7 % (ref 36.0–46.0)
Hemoglobin: 10.6 g/dL — ABNORMAL LOW (ref 12.0–15.0)
Immature Granulocytes: 0 %
Lymphocytes Relative: 9 %
Lymphs Abs: 0.9 K/uL (ref 0.7–4.0)
MCH: 29.5 pg (ref 26.0–34.0)
MCHC: 28.9 g/dL — ABNORMAL LOW (ref 30.0–36.0)
MCV: 102.2 fL — ABNORMAL HIGH (ref 80.0–100.0)
Monocytes Absolute: 0.9 K/uL (ref 0.1–1.0)
Monocytes Relative: 10 %
Neutro Abs: 7.4 K/uL (ref 1.7–7.7)
Neutrophils Relative %: 79 %
Platelets: 89 K/uL — ABNORMAL LOW (ref 150–400)
RBC: 3.59 MIL/uL — ABNORMAL LOW (ref 3.87–5.11)
RDW: 14.4 % (ref 11.5–15.5)
WBC: 9.4 K/uL (ref 4.0–10.5)
nRBC: 0 % (ref 0.0–0.2)

## 2023-08-24 LAB — BASIC METABOLIC PANEL WITH GFR
Anion gap: 5 (ref 5–15)
BUN: 8 mg/dL (ref 6–20)
CO2: 42 mmol/L — ABNORMAL HIGH (ref 22–32)
Calcium: 9.4 mg/dL (ref 8.9–10.3)
Chloride: 99 mmol/L (ref 98–111)
Creatinine, Ser: 0.47 mg/dL (ref 0.44–1.00)
GFR, Estimated: >60 mL/min (ref >60)
Glucose, Bld: 89 mg/dL (ref 70–99)
Potassium: 4.7 mmol/L (ref 3.5–5.1)
Sodium: 146 mmol/L — ABNORMAL HIGH (ref 135–145)

## 2023-08-24 LAB — VAS US ABI WITH/WO TBI
Left ABI: 1.17
Right ABI: 1.26

## 2023-08-24 NOTE — Progress Notes (Signed)
 Subjective: CC: No acute events overnight. Patient reports minimal pain today. Endorses nausea earlier this AM but denies emesis. Denies BM and Flatus today.   Objective: Vital signs in last 24 hours: Temp:  [98.5 F (36.9 C)-98.8 F (37.1 C)] 98.7 F (37.1 C) (08/05 0453) Pulse Rate:  [79-90] 79 (08/05 0453) Resp:  [15-20] 20 (08/05 0453) BP: (138-150)/(60-71) 146/70 (08/05 0453) SpO2:  [95 %-100 %] 100 % (08/05 0731) FiO2 (%):  [45 %] 45 % (08/05 0108) Last BM Date : 08/17/23  Intake/Output from previous day: 08/04 0701 - 08/05 0700 In: 653 [I.V.:3; IV Piggyback:650] Out: 1600 [Urine:1600] Intake/Output this shift: No intake/output data recorded.  PE: Gen:  Alert, NAD, pleasant Abd: Soft, obese, minimal distension, generalized abdominal ttp without rigidity or guarding.   Lab Results:  Recent Labs    08/23/23 0825 08/24/23 0359  WBC 9.3 9.4  HGB 11.1* 10.6*  HCT 37.9 36.7  PLT 80* 89*   BMET Recent Labs    08/23/23 0825 08/24/23 0359  NA 145 146*  K 5.4* 4.7  CL 100 99  CO2 35* 42*  GLUCOSE 95 89  BUN 8 8  CREATININE 0.48 0.47  CALCIUM  9.2 9.4   PT/INR No results for input(s): LABPROT, INR in the last 72 hours.  CMP     Component Value Date/Time   NA 146 (H) 08/24/2023 0359   K 4.7 08/24/2023 0359   CL 99 08/24/2023 0359   CO2 42 (H) 08/24/2023 0359   GLUCOSE 89 08/24/2023 0359   BUN 8 08/24/2023 0359   CREATININE 0.47 08/24/2023 0359   CALCIUM  9.4 08/24/2023 0359   PROT 6.9 08/19/2023 0212   ALBUMIN 3.0 (L) 08/19/2023 0212   AST 43 (H) 08/19/2023 0212   ALT 13 08/19/2023 0212   ALKPHOS 50 08/19/2023 0212   BILITOT 1.0 08/19/2023 0212   GFRNONAA >60 08/24/2023 0359   GFRAA >60 12/21/2018 1236   Lipase     Component Value Date/Time   LIPASE 25 09/09/2018 1416    Studies/Results: VAS US  ABI WITH/WO TBI Result Date: 08/23/2023  LOWER EXTREMITY DOPPLER STUDY Patient Name:  [name]  Date of Exam:   08/23/2023  Medical Rec #: [id number]           Accession #:    [id number] Date of Birth: 1965/06/24           Patient Gender: F Patient Age:   58 years Exam Location:  [facility] Procedure:      VAS US  ABI WITH/WO TBI Referring Phys: DELILIAH RASHID --------------------------------------------------------------------------------  Indications: Bilateral leg and foot pain High Risk Factors: Hypertension. Other Factors: Splenic laceration, obstructive sleep apnea, Myasthenia Gravis,                Morbid obesity (BMI 70).  Comparison Study: No prior study on file Performing Technologist: Rachel Pellet RVS  Examination Guidelines: A complete evaluation includes at minimum, Doppler waveform signals and systolic blood pressure reading at the level of bilateral brachial, anterior tibial, and posterior tibial arteries, when vessel segments are accessible. Bilateral testing is considered an integral part of a complete examination. Photoelectric Plethysmograph (PPG) waveforms and toe systolic pressure readings are included as required and additional duplex testing as needed. Limited examinations for reoccurring indications may be performed as noted.  ABI Findings: +---------+------------------+-----+-----------+--------+ Right    Rt Pressure (mmHg)IndexWaveform   Comment  +---------+------------------+-----+-----------+--------+ PTA      150  1.06 multiphasic         +---------+------------------+-----+-----------+--------+ DP       179               1.26 multiphasic         +---------+------------------+-----+-----------+--------+ Great Toe132               0.93 Normal              +---------+------------------+-----+-----------+--------+ +---------+------------------+-----+-----------+-------+ Left     Lt Pressure (mmHg)IndexWaveform   Comment +---------+------------------+-----+-----------+-------+ Brachial 142                    triphasic           +---------+------------------+-----+-----------+-------+ PTA      161               1.13 multiphasic        +---------+------------------+-----+-----------+-------+ DP       166               1.17 multiphasic        +---------+------------------+-----+-----------+-------+ Great Toe85                0.60 Abnormal           +---------+------------------+-----+-----------+-------+ +-------+-----------+-----------+------------+------------+ ABI/TBIToday's ABIToday's TBIPrevious ABIPrevious TBI +-------+-----------+-----------+------------+------------+ Right  1.26       0.93                                +-------+-----------+-----------+------------+------------+ Left   1.17       0.60                                +-------+-----------+-----------+------------+------------+   Summary: Right: Resting right ankle-brachial index is within normal range. The right toe-brachial index is normal. Left: Resting left ankle-brachial index is within normal range. The left toe-brachial index is abnormal. *See table(s) above for measurements and observations.     Preliminary      Anti-infectives: Anti-infectives (From admission, onward)    Start     Dose/Rate Route Frequency Ordered Stop   08/20/23 1400  doxycycline  (VIBRAMYCIN ) 100 mg in sodium chloride  0.9 % 250 mL IVPB        100 mg 125 mL/hr over 120 Minutes Intravenous Every 12 hours 08/20/23 1247     08/19/23 1800  cefTRIAXone  (ROCEPHIN ) 2 g in sodium chloride  0.9 % 100 mL IVPB        2 g 200 mL/hr over 30 Minutes Intravenous Every 24 hours 08/18/23 2101     08/18/23 2200  doxycycline  (VIBRA -TABS) tablet 100 mg  Status:  Discontinued        100 mg Oral Every 12 hours 08/18/23 2156 08/20/23 1247   08/18/23 1730  cefTRIAXone  (ROCEPHIN ) 2 g in sodium chloride  0.9 % 100 mL IVPB        2 g 200 mL/hr over 30 Minutes Intravenous Once 08/18/23 1726 08/18/23 1841        Assessment/Plan Non-traumatic splenic laceration   -  CTA 7/30 w/ apparent splenic lacerations in the midpole region with surrounding subcapsular hematoma. No free fluid in the abdomen or pelvis. No active extravasation of contrast. - Hgb stable at 10.6 today. - HDS without tachycardia or hypotension - No peritonitis noted on exam - If significantly dropping hgb or develops hemodynamic changes, please let our team know so we can evaluate and  provide updated recs-likely will recommend IR for embolization.  -Continue soft.  -Patient was encouraged to mobilize. PT is following patient- Recommend SNF upon D/C.  -Hgb remains stable. No urgent or emergent surgical intervention needed at this time. We will sign off but will be available for a re-consultation should patient become unstable hemodynamically.  FEN - Soft, IVF per primary  VTE - SCDs, on hold for above ID - Rocephin /Doxy per primary for PNA  I reviewed nursing notes, hospitalist notes, last 24 h vitals and pain scores, last 48 h intake and output, last 24 h labs and trends, and last 24 h imaging results.   LOS: 6 days    Eulah Hammonds, [facility] Surgery 08/24/2023, 8:26 AM Please see Amion for pager number during day hours 7:00am-4:30pm

## 2023-08-24 NOTE — Progress Notes (Signed)
 Progress Note   Patient: [name] FMW:[id number] DOB: 11-Feb-1965 DOA: 08/18/2023     6 DOS: the patient was seen and examined on 08/24/2023   Brief hospital course:  58 y.o. female with medical history significant for myasthenia gravis, moderate persistent asthma, chronic hypoxic and hypercarbic respiratory failure on 2 L O2 [place], history of PE no longer on anticoagulation, HTN, hypothyroidism, depression, morbid obesity, OSA on CPAP who presented to the ED for evaluation of shortness of breath. Found to have splenic laceration and subcapsular hematoma and general surgery is on board and recommendation is conservative mangement. Hemodynamically stable. Needs SNF, medically ready.  Assessment and Plan:  Sepsis due to bilateral lower lobe possible bacterial pneumonia: Improving, leukocytosis resolved. Presenting with leukocytosis, tachycardia, tachypnea.  Imaging suspicious for bilateral lobe pneumonia.  COVID, influenza, RSV negative.  Lactic acidosis has resolved. - Continue ceftriaxone  and doxycycline  - Received IVF - Follow blood cultures-NGTD  - f/u strep pneumonia (negative) and Legionella urine antigens - Continue supplemental O2 as needed and wean to home 2 L [place] as able   Acute on chronic hypoxic and hypercapnic respiratory failure: In setting of pneumonia as above as well as OHS/OSA.  Patient uses 2 L O2 [place] at baseline, requiring 4 L O2 [place] on admission.  With history of myasthenia gravis, she is at increased risk for respiratory decompensation. - Continue supplemental O2 - Continue CPAP nightly  Hyperkalemia: resolved   Elevated troponin/supply demand mismatch: Mild troponin elevation, downtrending on repeat.  Patient denies chest pain.  EKG without significant ST changes.  Likely demand ischemia in setting of sepsis and hypoxic respiratory failure.  Monitor on telemetry.   Splenic laceration and subcapsular hematoma: Patient told me she fell in the bathroom almost 2-3 months  back. CT A/P showed indeterminate heterogeneous enhancement of the spleen with mild enlargement when compared to prior.  Splenic laceration and subcapsular fluid collections are possibility as well as infection.  - CTA abdomen showed markedly abnormal spleen with splenic lacerations at mid pole and subcapsular hematoma - Appreciate general surgery recommendations. They signed off on 8/5. -f/u H&H closely and avoid any anticoagulants. -May need IR intervention if she decompensates.  Possible ileus: Improved.She has nausea, vomiting and abdominal pain. Started on clear liquids on 8/2. Advanced to a full liquid diet on 8/3 and then to a soft diet on 8/4. KUB done on 8/1 showed non obstructive bowel gas pattern.   Myasthenia gravis: Continue Mestinon  and CellCept .  NIF/VC have been good.  PT/OT eval.   Moderate persistent asthma: No obvious wheezing on admission.  Continue Singulair  and albuterol  nebulizers as needed.   Hypertension: Continue losartan . Prn antihypertensives   Thrombocytopenia: New mild thrombocytopenia.  CTA abdomen to evaluate spleen as above.  Will hold blood thinners.   Hypothyroidism: Continue Synthroid .   OSA: Continue CPAP nightly.   Major moderate Depression: Continue Abilify  and Remeron .   Class III obesity: Estimated body mass index is 65.84 kg/m as calculated from the following:   Height as of 10/20/22: 5' 2 (1.575 m).   Weight as of 10/20/22: 163.3 kg.   On wegovy  Bilateral feet pain and intermittent claudication symptoms: Ordered VAS US  ABI and it is normal.  Disposition: Needs SNF placement      Subjective:Denies abdominal pain or shortness of breath. She wants to go to SNF on discharge. She is not nauseous this morning.  Physical Exam: Vitals:   08/24/23 9891 08/24/23 0453 08/24/23 0731 08/24/23 0904  BP:  ROLLEN)  146/70  (!) 144/68  Pulse: 81 79  82  Resp: 19 20  20   Temp:  98.7 F (37.1 C)  97.6 F (36.4 C)  TempSrc:  Oral  Oral   SpO2: 97% 98% 100% 99%  Height:       Constitutional: alert and awake, looks comfortable, nasal oxygen in place Eyes: PERRL, lids and conjunctivae normal ENMT: Mucous membranes are moist. Posterior pharynx clear of any exudate or lesions.Normal dentition.  Neck: normal, supple, no masses, no thyromegaly Respiratory: coarse breath sounds bilaterally Cardiovascular: Regular rate and rhythm, no murmurs / rubs / gallops. No extremity edema. 2+ pedal pulses. No carotid bruits.  Abdomen: tenderness to palpation diffusely, no masses palpated. No hepatosplenomegaly. Bowel sounds hypoactive.  Musculoskeletal: no clubbing / cyanosis. No joint deformity upper and lower extremities. Good ROM, no contractures. Normal muscle tone. Slight tenderness to palpation over bilateral toes Skin: no rashes, lesions, ulcers. No induration Neurologic: CN 2-12 grossly intact. Sensation intact, DTR normal. Strength 5/5 x all 4 extremities.  Psychiatric: Normal judgment and insight. Alert and oriented x 3. Normal mood.    Data Reviewed:  There are no new results to review at this time.  Family Communication: None at the bedside  Disposition: Status is: Inpatient Remains inpatient appropriate because: Splenic laceration, ARF  Planned Discharge Destination: SNF    Time spent: 41 minutes  Author: Deliliah Room, MD 08/24/2023 9:23 AM  For on call review [email or web address].

## 2023-08-24 NOTE — Progress Notes (Signed)
 Patient got -30 on NIF best out of three attempts.

## 2023-08-25 ENCOUNTER — Inpatient Hospital Stay (HOSPITAL_COMMUNITY)

## 2023-08-25 ENCOUNTER — Other Ambulatory Visit: Payer: Self-pay

## 2023-08-25 DIAGNOSIS — J9622 Acute and chronic respiratory failure with hypercapnia: Secondary | ICD-10-CM | POA: Diagnosis not present

## 2023-08-25 DIAGNOSIS — R7989 Other specified abnormal findings of blood chemistry: Secondary | ICD-10-CM

## 2023-08-25 DIAGNOSIS — J9621 Acute and chronic respiratory failure with hypoxia: Secondary | ICD-10-CM

## 2023-08-25 DIAGNOSIS — J4540 Moderate persistent asthma, uncomplicated: Secondary | ICD-10-CM

## 2023-08-25 DIAGNOSIS — G7001 Myasthenia gravis with (acute) exacerbation: Secondary | ICD-10-CM | POA: Diagnosis not present

## 2023-08-25 DIAGNOSIS — S36039A Unspecified laceration of spleen, initial encounter: Secondary | ICD-10-CM

## 2023-08-25 DIAGNOSIS — J9602 Acute respiratory failure with hypercapnia: Secondary | ICD-10-CM

## 2023-08-25 DIAGNOSIS — J189 Pneumonia, unspecified organism: Secondary | ICD-10-CM | POA: Diagnosis not present

## 2023-08-25 DIAGNOSIS — G7000 Myasthenia gravis without (acute) exacerbation: Secondary | ICD-10-CM

## 2023-08-25 DIAGNOSIS — J9601 Acute respiratory failure with hypoxia: Secondary | ICD-10-CM | POA: Insufficient documentation

## 2023-08-25 LAB — COMPREHENSIVE METABOLIC PANEL WITH GFR
ALT: 12 U/L (ref 0–44)
AST: 18 U/L (ref 15–41)
Albumin: 2.9 g/dL — ABNORMAL LOW (ref 3.5–5.0)
Alkaline Phosphatase: 51 U/L (ref 38–126)
Anion gap: 9 (ref 5–15)
BUN: 8 mg/dL (ref 6–20)
CO2: 42 mmol/L — ABNORMAL HIGH (ref 22–32)
Calcium: 10 mg/dL (ref 8.9–10.3)
Chloride: 95 mmol/L — ABNORMAL LOW (ref 98–111)
Creatinine, Ser: 0.49 mg/dL (ref 0.44–1.00)
GFR, Estimated: >60 mL/min (ref >60)
Glucose, Bld: 105 mg/dL — ABNORMAL HIGH (ref 70–99)
Potassium: 4.6 mmol/L (ref 3.5–5.1)
Sodium: 146 mmol/L — ABNORMAL HIGH (ref 135–145)
Total Bilirubin: 1 mg/dL (ref 0.0–1.2)
Total Protein: 7.1 g/dL (ref 6.5–8.1)

## 2023-08-25 LAB — POCT I-STAT 7, (LYTES, BLD GAS, ICA,H+H)
Acid-Base Excess: 23 mmol/L — ABNORMAL HIGH (ref 0.0–2.0)
Bicarbonate: 52.7 mmol/L — ABNORMAL HIGH (ref 20.0–28.0)
Calcium, Ion: 1.28 mmol/L (ref 1.15–1.40)
HCT: 34 % — ABNORMAL LOW (ref 36.0–46.0)
Hemoglobin: 11.6 g/dL — ABNORMAL LOW (ref 12.0–15.0)
O2 Saturation: 99 %
Patient temperature: 97.3
Potassium: 4 mmol/L (ref 3.5–5.1)
Sodium: 146 mmol/L — ABNORMAL HIGH (ref 135–145)
TCO2: >50 mmol/L — ABNORMAL HIGH (ref 22–32)
pCO2 arterial: 90.4 mmHg (ref 32–48)
pH, Arterial: 7.371 (ref 7.35–7.45)
pO2, Arterial: 141 mmHg — ABNORMAL HIGH (ref 83–108)

## 2023-08-25 LAB — GLUCOSE, CAPILLARY
Glucose-Capillary: 103 mg/dL — ABNORMAL HIGH (ref 70–99)
Glucose-Capillary: 99 mg/dL (ref 70–99)
Glucose-Capillary: 89 mg/dL (ref 70–99)
Glucose-Capillary: 98 mg/dL (ref 70–99)

## 2023-08-25 LAB — CBC WITH DIFFERENTIAL/PLATELET
Abs Immature Granulocytes: 0.05 K/uL (ref 0.00–0.07)
Basophils Absolute: 0 K/uL (ref 0.0–0.1)
Basophils Relative: 0 %
Eosinophils Absolute: 0.1 K/uL (ref 0.0–0.5)
Eosinophils Relative: 1 %
HCT: 39.4 % (ref 36.0–46.0)
Hemoglobin: 11.3 g/dL — ABNORMAL LOW (ref 12.0–15.0)
Immature Granulocytes: 1 %
Lymphocytes Relative: 6 %
Lymphs Abs: 0.7 K/uL (ref 0.7–4.0)
MCH: 29.2 pg (ref 26.0–34.0)
MCHC: 28.7 g/dL — ABNORMAL LOW (ref 30.0–36.0)
MCV: 101.8 fL — ABNORMAL HIGH (ref 80.0–100.0)
Monocytes Absolute: 0.8 K/uL (ref 0.1–1.0)
Monocytes Relative: 8 %
Neutro Abs: 9.2 K/uL — ABNORMAL HIGH (ref 1.7–7.7)
Neutrophils Relative %: 84 %
Platelets: 109 K/uL — ABNORMAL LOW (ref 150–400)
RBC: 3.87 MIL/uL (ref 3.87–5.11)
RDW: 14.2 % (ref 11.5–15.5)
WBC: 10.9 K/uL — ABNORMAL HIGH (ref 4.0–10.5)
nRBC: 0 % (ref 0.0–0.2)

## 2023-08-25 LAB — BLOOD GAS, ARTERIAL
Acid-Base Excess: 21.6 mmol/L — ABNORMAL HIGH (ref 0.0–2.0)
Bicarbonate: 53.6 mmol/L — ABNORMAL HIGH (ref 20.0–28.0)
Drawn by: 33176
O2 Saturation: 96.1 %
Patient temperature: 36.8
pCO2 arterial: 103 mmHg (ref 32–48)
pH, Arterial: 7.32 — ABNORMAL LOW (ref 7.35–7.45)
pO2, Arterial: 74 mmHg — ABNORMAL LOW (ref 83–108)

## 2023-08-25 LAB — MRSA NEXT GEN BY PCR, NASAL: MRSA by PCR Next Gen: NOT DETECTED

## 2023-08-25 LAB — PHOSPHORUS: Phosphorus: 3.1 mg/dL (ref 2.5–4.6)

## 2023-08-25 LAB — HIV ANTIBODY (ROUTINE TESTING W REFLEX): HIV Screen 4th Generation wRfx: NONREACTIVE

## 2023-08-25 LAB — MAGNESIUM: Magnesium: 2 mg/dL (ref 1.7–2.4)

## 2023-08-25 LAB — VITAMIN B12: Vitamin B-12: 1451 pg/mL — ABNORMAL HIGH (ref 180–914)

## 2023-08-25 LAB — AMMONIA: Ammonia: 65 umol/L — ABNORMAL HIGH (ref 9–35)

## 2023-08-25 LAB — RPR: RPR Ser Ql: NONREACTIVE

## 2023-08-25 LAB — TSH: TSH: 3.286 u[IU]/mL (ref 0.350–4.500)

## 2023-08-25 MED ORDER — ETOMIDATE 2 MG/ML IV SOLN
INTRAVENOUS | Status: AC
Start: 2023-08-25 — End: 2023-08-25
  Administered 2023-08-25: 20 mg via INTRAVENOUS
  Filled 2023-08-25: qty 20

## 2023-08-25 MED ORDER — SODIUM BICARBONATE 8.4 % IV SOLN
INTRAVENOUS | Status: AC
Start: 2023-08-25 — End: 2023-08-25
  Administered 2023-08-25: 50 meq via INTRAVENOUS
  Filled 2023-08-25: qty 50

## 2023-08-25 MED ORDER — PROPOFOL 1000 MG/100ML IV EMUL
0.0000 ug/kg/min | INTRAVENOUS | Status: DC
  Administered 2023-08-25: 35 ug/kg/min via INTRAVENOUS
  Administered 2023-08-25: 25 ug/kg/min via INTRAVENOUS
  Administered 2023-08-26: 20 ug/kg/min via INTRAVENOUS
  Administered 2023-08-26: 35 ug/kg/min via INTRAVENOUS
  Administered 2023-08-26 (×2): 20 ug/kg/min via INTRAVENOUS
  Administered 2023-08-26: 30 ug/kg/min via INTRAVENOUS
  Administered 2023-08-27: 20 ug/kg/min via INTRAVENOUS
  Filled 2023-08-25 (×8): qty 100

## 2023-08-25 MED ORDER — SODIUM BICARBONATE 8.4 % IV SOLN: 50.0000 meq | Freq: Once | INTRAVENOUS | Status: AC

## 2023-08-25 MED ORDER — SUCCINYLCHOLINE CHLORIDE 200 MG/10ML IV SOSY
PREFILLED_SYRINGE | INTRAVENOUS | Status: AC
  Filled 2023-08-25: qty 10

## 2023-08-25 MED ORDER — ACETAMINOPHEN 325 MG PO TABS: 650.0000 mg | ORAL_TABLET | Freq: Four times a day (QID) | ORAL | Status: DC | PRN

## 2023-08-25 MED ORDER — MIDAZOLAM HCL 2 MG/2ML IJ SOLN
INTRAMUSCULAR | Status: DC
Start: 2023-08-25 — End: 2023-08-25
  Filled 2023-08-25: qty 2

## 2023-08-25 MED ORDER — FENTANYL CITRATE PF 50 MCG/ML IJ SOSY
PREFILLED_SYRINGE | INTRAMUSCULAR | Status: DC
Start: 2023-08-25 — End: 2023-08-25
  Filled 2023-08-25: qty 2

## 2023-08-25 MED ORDER — ACETAMINOPHEN 650 MG RE SUPP: 650.0000 mg | Freq: Four times a day (QID) | RECTAL | Status: DC | PRN

## 2023-08-25 MED ORDER — ROCURONIUM BROMIDE 10 MG/ML (PF) SYRINGE: 70.0000 mg | PREFILLED_SYRINGE | Freq: Once | INTRAVENOUS | Status: DC

## 2023-08-25 MED ORDER — POLYETHYLENE GLYCOL 3350 17 G PO PACK
17.0000 g | PACK | Freq: Every day | ORAL | Status: DC
  Administered 2023-08-25 – 2023-08-26 (×2): 17 g
  Filled 2023-08-25 (×2): qty 1

## 2023-08-25 MED ORDER — FENTANYL CITRATE PF 50 MCG/ML IJ SOSY: 50.0000 ug | PREFILLED_SYRINGE | INTRAMUSCULAR | Status: DC | PRN

## 2023-08-25 MED ORDER — PANTOPRAZOLE SODIUM 40 MG IV SOLR
40.0000 mg | Freq: Every day | INTRAVENOUS | Status: DC
  Administered 2023-08-25 – 2023-09-01 (×11): 40 mg via INTRAVENOUS
  Filled 2023-08-25 (×8): qty 10

## 2023-08-25 MED ORDER — ONDANSETRON HCL 4 MG PO TABS: 4.0000 mg | ORAL_TABLET | Freq: Four times a day (QID) | ORAL | Status: DC | PRN

## 2023-08-25 MED ORDER — ONDANSETRON HCL 4 MG/2ML IJ SOLN: 4.0000 mg | Freq: Four times a day (QID) | INTRAMUSCULAR | Status: DC | PRN

## 2023-08-25 MED ORDER — MONTELUKAST SODIUM 10 MG PO TABS
10.0000 mg | ORAL_TABLET | Freq: Every day | ORAL | Status: DC
  Administered 2023-08-25 – 2023-08-31 (×9): 10 mg
  Filled 2023-08-25 (×7): qty 1

## 2023-08-25 MED ORDER — ETOMIDATE 2 MG/ML IV SOLN: 20.0000 mg | Freq: Once | INTRAVENOUS | Status: AC

## 2023-08-25 MED ORDER — PYRIDOSTIGMINE BROMIDE 60 MG PO TABS
30.0000 mg | ORAL_TABLET | Freq: Every day | ORAL | Status: DC
  Administered 2023-08-25 – 2023-08-30 (×27): 30 mg
  Filled 2023-08-25 (×31): qty 0.5

## 2023-08-25 MED ORDER — SODIUM CHLORIDE 0.9% FLUSH: 10.0000 mL | INTRAVENOUS | Status: DC | PRN

## 2023-08-25 MED ORDER — SODIUM CHLORIDE 0.9% FLUSH
10.0000 mL | Freq: Two times a day (BID) | INTRAVENOUS | Status: DC
  Administered 2023-08-25: 40 mL
  Administered 2023-08-26 – 2023-08-29 (×6): 10 mL
  Administered 2023-08-30: 30 mL
  Administered 2023-08-30 (×2): 10 mL
  Administered 2023-08-30: 30 mL
  Administered 2023-08-31 – 2023-09-02 (×10): 10 mL
  Administered 2023-09-03: 30 mL
  Administered 2023-09-03 – 2023-09-07 (×8): 10 mL

## 2023-08-25 MED ORDER — PHENYLEPHRINE 80 MCG/ML (10ML) SYRINGE FOR IV PUSH (FOR BLOOD PRESSURE SUPPORT)
PREFILLED_SYRINGE | INTRAVENOUS | Status: AC
  Filled 2023-08-25: qty 10

## 2023-08-25 MED ORDER — FENTANYL CITRATE PF 50 MCG/ML IJ SOSY
50.0000 ug | PREFILLED_SYRINGE | INTRAMUSCULAR | Status: DC | PRN
  Administered 2023-08-25 – 2023-08-28 (×2): 50 ug via INTRAVENOUS
  Filled 2023-08-25: qty 1
  Filled 2023-08-25: qty 2

## 2023-08-25 MED ORDER — PYRIDOSTIGMINE BROMIDE 10 MG/2ML IV SOLN
1.0000 mg | Freq: Every day | INTRAVENOUS | Status: DC
  Filled 2023-08-25 (×4): qty 0.2

## 2023-08-25 MED ORDER — MIDAZOLAM HCL 2 MG/2ML IJ SOLN: 1.0000 mg | INTRAMUSCULAR | Status: DC | PRN

## 2023-08-25 MED ORDER — MYCOPHENOLATE MOFETIL HCL 500 MG IV SOLR
1000.0000 mg | Freq: Two times a day (BID) | INTRAVENOUS | Status: DC
  Filled 2023-08-25 (×5): qty 30

## 2023-08-25 MED ORDER — IMMUNE GLOBULIN (HUMAN) 10 GM/100ML IV SOLN
400.0000 mg/kg | INTRAVENOUS | Status: AC
  Administered 2023-08-25 – 2023-08-29 (×5): 30 g via INTRAVENOUS
  Filled 2023-08-25 (×5): qty 300

## 2023-08-25 MED ORDER — KETAMINE HCL 50 MG/5ML IJ SOSY
PREFILLED_SYRINGE | INTRAMUSCULAR | Status: AC
  Filled 2023-08-25: qty 10

## 2023-08-25 MED ORDER — ROCURONIUM BROMIDE 10 MG/ML (PF) SYRINGE: 100.0000 mg | PREFILLED_SYRINGE | Freq: Once | INTRAVENOUS | Status: AC

## 2023-08-25 MED ORDER — DOCUSATE SODIUM 50 MG/5ML PO LIQD
100.0000 mg | Freq: Two times a day (BID) | ORAL | Status: DC
  Administered 2023-08-25 – 2023-08-27 (×6): 100 mg
  Filled 2023-08-25 (×6): qty 10

## 2023-08-25 MED ORDER — BUDESONIDE 0.5 MG/2ML IN SUSP
0.5000 mg | Freq: Two times a day (BID) | RESPIRATORY_TRACT | Status: DC
  Administered 2023-08-25 – 2023-09-07 (×32): 0.5 mg via RESPIRATORY_TRACT
  Filled 2023-08-25 (×27): qty 2

## 2023-08-25 MED ORDER — CHLORHEXIDINE GLUCONATE CLOTH 2 % EX PADS
6.0000 | MEDICATED_PAD | Freq: Every day | CUTANEOUS | Status: DC
  Administered 2023-08-25 – 2023-09-07 (×17): 6 via TOPICAL

## 2023-08-25 MED ORDER — SENNOSIDES-DOCUSATE SODIUM 8.6-50 MG PO TABS: 1.0000 | ORAL_TABLET | Freq: Every evening | ORAL | Status: DC | PRN

## 2023-08-25 MED ORDER — PROPOFOL 1000 MG/100ML IV EMUL
INTRAVENOUS | Status: AC
  Administered 2023-08-25: 20 ug/kg/min via INTRAVENOUS
  Filled 2023-08-25: qty 100

## 2023-08-25 MED ORDER — ROCURONIUM BROMIDE 10 MG/ML (PF) SYRINGE
PREFILLED_SYRINGE | INTRAVENOUS | Status: AC
  Administered 2023-08-25: 100 mg via INTRAVENOUS
  Filled 2023-08-25: qty 10

## 2023-08-25 MED ORDER — LEVOTHYROXINE SODIUM 100 MCG PO TABS
200.0000 ug | ORAL_TABLET | Freq: Every day | ORAL | Status: DC
  Administered 2023-08-26 – 2023-08-30 (×6): 200 ug
  Filled 2023-08-25 (×5): qty 2

## 2023-08-25 NOTE — Progress Notes (Signed)
 PT Cancellation Note  Patient Details Name: FLYNN LININGER MRN: [id number] DOB: 07-02-1965   Cancelled Treatment:    Reason Eval/Treat Not Completed: Medical issues which prohibited therapy (Pt having respiratory issues. Going to be transferred to ICU.  Will check back at later date.)   Stephane JULIANNA Bevel 08/25/2023, 11:00 AM Reylynn Vanalstine M,PT Acute Rehab Services [phone]

## 2023-08-25 NOTE — Progress Notes (Signed)
 PROGRESS NOTE    [name]  FMW:[id number] DOB: 1966-01-10 DOA: 08/18/2023 PCP: Minerva Rosina HERO, MD   Brief Narrative:  [name] is a 58 y.o. female with medical history significant for myasthenia gravis, moderate persistent asthma, chronic hypoxic and hypercarbic respiratory failure on 2 L O2 [place], history of PE no longer on anticoagulation, HTN, hypothyroidism, depression, morbid obesity, OSA on CPAP who is admitted with sepsis and acute on chronic hypoxic respiratory failure due to bibasilar pneumonia.  On the AM of 08/25/23 Patient decompensated and became more encephlopathic and likely 2/2 to Myasthenia Gravis Flare. Neuro urgently consulted and ABG checked and patient was hypercarbic. Placed on BiPAP and PCCM consulted and she was transferred to the ICU and then subsequently intubated. TRH will sign off at this time and will pick the patient up once medically stable to transfer out of the ICU.    DVT prophylaxis: SCDs Start: 08/18/23 2056    Code Status: Full Code Family Communication: Called Sister but she did not pick up the phone  Disposition Plan:  Level of care: ICU Status is: Inpatient Remains inpatient appropriate because: Going to the intensive care unit for further care   Consultants:  General Surgery Neurology PCCM  Procedures:  As delineated as above  Antimicrobials:  Anti-infectives (From admission, onward)    Start     Dose/Rate Route Frequency Ordered Stop   08/20/23 1400  doxycycline  (VIBRAMYCIN ) 100 mg in sodium chloride  0.9 % 250 mL IVPB        100 mg 125 mL/hr over 120 Minutes Intravenous Every 12 hours 08/20/23 1247     08/19/23 1800  cefTRIAXone  (ROCEPHIN ) 2 g in sodium chloride  0.9 % 100 mL IVPB        2 g 200 mL/hr over 30 Minutes Intravenous Every 24 hours 08/18/23 2101     08/18/23 2200  doxycycline  (VIBRA -TABS) tablet 100 mg  Status:  Discontinued        100 mg Oral Every 12 hours 08/18/23 2156 08/20/23 1247   08/18/23 1730   cefTRIAXone  (ROCEPHIN ) 2 g in sodium chloride  0.9 % 100 mL IVPB        2 g 200 mL/hr over 30 Minutes Intravenous Once 08/18/23 1726 08/18/23 1841       Objective: Vitals:   08/25/23 1730 08/25/23 1745 08/25/23 1800 08/25/23 1815  BP: (!) 110/50 (!) 115/55 104/70 (!) 105/52  Pulse: 80 76 77 78  Resp: (!) 27 17 (!) 21 20  Temp:      TempSrc:      SpO2: 96% 94% 94% 94%  Weight:      Height:        Intake/Output Summary (Last 24 hours) at 08/25/2023 1827 Last data filed at 08/25/2023 1823 Gross per 24 hour  Intake 2231.9 ml  Output 500 ml  Net 1731.9 ml   Filed Weights   08/25/23 1150 08/25/23 1211  Weight: 128.6 kg 128.6 kg   Data Reviewed: I have personally reviewed following labs and imaging studies  CBC: Recent Labs  Lab 08/20/23 0937 08/21/23 1129 08/22/23 0310 08/23/23 0825 08/24/23 0359 08/25/23 0315 08/25/23 1349  WBC 18.6* 14.2* 12.2* 9.3 9.4 10.9*  --   NEUTROABS 16.5*  --  9.6* 7.4 7.4 9.2*  --   HGB 11.6* 11.0* 10.8* 11.1* 10.6* 11.3* 11.6*  HCT 38.1 37.1 36.5 37.9 36.7 39.4 34.0*  MCV 98.2 100.0 100.0 100.8* 102.2* 101.8*  --   PLT 67* 73* 79* 80* 89* 109*  --  Basic Metabolic Panel: Recent Labs  Lab 08/21/23 1129 08/22/23 0310 08/23/23 0825 08/24/23 0359 08/25/23 0909 08/25/23 1349  NA 142 143 145 146* 146* 146*  K 4.5 4.3 5.4* 4.7 4.6 4.0  CL 98 99 100 99 95*  --   CO2 35* 36* 35* 42* 42*  --   GLUCOSE 104* 91 95 89 105*  --   BUN 12 11 8 8 8   --   CREATININE 0.52 0.44 0.48 0.47 0.49  --   CALCIUM  9.4 9.2 9.2 9.4 10.0  --   MG  --  2.1  --   --  2.0  --   PHOS  --   --   --   --  3.1  --    GFR: Estimated Creatinine Clearance: 95.2 mL/min (by C-G formula based on SCr of 0.49 mg/dL). Liver Function Tests: Recent Labs  Lab 08/19/23 0212 08/25/23 0909  AST 43* 18  ALT 13 12  ALKPHOS 50 51  BILITOT 1.0 1.0  PROT 6.9 7.1  ALBUMIN 3.0* 2.9*   No results for input(s): LIPASE, AMYLASE in the last 168 hours. Recent Labs  Lab  08/25/23 0909  AMMONIA 65*   Coagulation Profile: Recent Labs  Lab 08/19/23 0212  INR 1.2   Cardiac Enzymes: No results for input(s): CKTOTAL, CKMB, CKMBINDEX, TROPONINI in the last 168 hours. BNP (last 3 results) No results for input(s): PROBNP in the last 8760 hours. HbA1C: No results for input(s): HGBA1C in the last 72 hours. CBG: Recent Labs  Lab 08/25/23 1156 08/25/23 1601  GLUCAP 103* 99   Lipid Profile: No results for input(s): CHOL, HDL, LDLCALC, TRIG, CHOLHDL, LDLDIRECT in the last 72 hours. Thyroid  Function Tests: Recent Labs    08/25/23 0909  TSH 3.286   Anemia Panel: Recent Labs    08/25/23 0909  VITAMINB12 1,451*   Sepsis Labs: Recent Labs  Lab 08/18/23 1916 08/18/23 2116 08/19/23 0214  LATICACIDVEN 2.6* 0.8 0.5   Recent Results (from the past 240 hours)  Blood Culture (routine x 2)     Status: None   Collection Time: 08/18/23  5:48 PM   Specimen: BLOOD  Result Value Ref Range Status   Specimen Description BLOOD SITE NOT SPECIFIED  Final   Special Requests   Final    BOTTLES DRAWN AEROBIC AND ANAEROBIC Blood Culture adequate volume   Culture   Final    NO GROWTH 5 DAYS Performed at [facility], [address] [place], [place] [address]    Report Status 08/23/2023 FINAL  Final  Resp panel by RT-PCR (RSV, Flu A&B, Covid) Anterior Nasal Swab     Status: None   Collection Time: 08/18/23  6:06 PM   Specimen: Anterior Nasal Swab  Result Value Ref Range Status   SARS Coronavirus 2 by RT PCR NEGATIVE NEGATIVE Final   Influenza A by PCR NEGATIVE NEGATIVE Final   Influenza B by PCR NEGATIVE NEGATIVE Final    Comment: (NOTE) The Xpert Xpress SARS-CoV-2/FLU/RSV plus assay is intended as an aid in the diagnosis of influenza from Nasopharyngeal swab specimens and should not be used as a sole basis for treatment. Nasal washings and aspirates are unacceptable for Xpert Xpress SARS-CoV-2/FLU/RSV testing.  Fact Sheet  for Patients: BloggerCourse.com  Fact Sheet for Healthcare Providers: SeriousBroker.it  This test is not yet approved or cleared by the [place]  FDA and has been authorized for detection and/or diagnosis of SARS-CoV-2 by FDA under an Emergency Use Authorization (EUA).  This EUA will remain in effect (meaning this test can be used) for the duration of the COVID-19 declaration under Section 564(b)(1) of the Act, 21 U.S.C. section 360bbb-3(b)(1), unless the authorization is terminated or revoked.     Resp Syncytial Virus by PCR NEGATIVE NEGATIVE Final    Comment: (NOTE) Fact Sheet for Patients: BloggerCourse.com  Fact Sheet for Healthcare Providers: SeriousBroker.it  This test is not yet approved or cleared by the [place]  FDA and has been authorized for detection and/or diagnosis of SARS-CoV-2 by FDA under an Emergency Use Authorization (EUA). This EUA will remain in effect (meaning this test can be used) for the duration of the COVID-19 declaration under Section 564(b)(1) of the Act, 21 U.S.C. section 360bbb-3(b)(1), unless the authorization is terminated or revoked.  Performed at [facility], [address] [place], [place] [address]   Blood Culture (routine x 2)     Status: None   Collection Time: 08/18/23  6:15 PM   Specimen: BLOOD LEFT FOREARM  Result Value Ref Range Status   Specimen Description BLOOD LEFT FOREARM  Final   Special Requests   Final    BOTTLES DRAWN AEROBIC AND ANAEROBIC Blood Culture adequate volume   Culture   Final    NO GROWTH 5 DAYS Performed at [facility], [address] [place], [place] [address]    Report Status 08/23/2023 FINAL  Final  MRSA Next Gen by PCR, Nasal     Status: None   Collection Time: 08/19/23  1:58 PM   Specimen: Nasal Mucosa; Nasal Swab  Result Value Ref Range Status   MRSA by PCR Next Gen NOT DETECTED  NOT DETECTED Final    Comment: (NOTE) The GeneXpert MRSA Assay (FDA approved for NASAL specimens only), is one component of a comprehensive MRSA colonization surveillance program. It is not intended to diagnose MRSA infection nor to guide or monitor treatment for MRSA infections. Test performance is not FDA approved in patients less than 91 years old. Performed at [facility], [address], [place], [place] [address]   MRSA Next Gen by PCR, Nasal     Status: None   Collection Time: 08/25/23 12:04 PM   Specimen: Nasal Mucosa; Nasal Swab  Result Value Ref Range Status   MRSA by PCR Next Gen NOT DETECTED NOT DETECTED Final    Comment: (NOTE) The GeneXpert MRSA Assay (FDA approved for NASAL specimens only), is one component of a comprehensive MRSA colonization surveillance program. It is not intended to diagnose MRSA infection nor to guide or monitor treatment for MRSA infections. Test performance is not FDA approved in patients less than 89 years old. Performed at [facility], [address] [place], [place] [address]     Radiology Studies: Portable Chest x-ray Result Date: 08/25/2023 CLINICAL DATA:  Respiratory failure. EXAM: PORTABLE CHEST 1 VIEW COMPARISON:  Same day. FINDINGS: Endotracheal and nasogastric tubes are in grossly good position. Sternotomy wires are noted. There is now noted complete opacification of the left hemithorax most likely due to pneumonia, edema, atelectasis and/or effusion. No significant abnormality seen in the right lung. Bony thorax is unremarkable. IMPRESSION: Support apparatus as noted above. Complete opacification of left hemithorax is now noted most likely due to pneumonia, edema, atelectasis and/or pleural effusion. Electronically Signed   By: Lynwood Landy Raddle M.D.   On: 08/25/2023 13:34   DG Abd Portable 1V Result Date: 08/25/2023 CLINICAL DATA:  Nasogastric tube placement. EXAM: PORTABLE ABDOMEN -  1 VIEW COMPARISON:  April 20, 2023.  FINDINGS: Distal tip of nasogastric tube is either in the distal stomach or proximal duodenum. IMPRESSION: Nasogastric tube tip seen in either distal stomach or proximal duodenum. Electronically Signed   By: Lynwood Landy Raddle M.D.   On: 08/25/2023 13:31   DG CHEST PORT 1 VIEW Result Date: 08/25/2023 CLINICAL DATA:  Shortness of breath. EXAM: PORTABLE CHEST 1 VIEW COMPARISON:  August 18, 2023. FINDINGS: Stable cardiomegaly. Mild central pulmonary vascular congestion is noted. Sternotomy wires are noted. Left basilar atelectasis or infiltrate is noted with small pleural effusion. Bony thorax is unremarkable. IMPRESSION: Stable cardiomegaly with mild central pulmonary vascular congestion. Left basilar atelectasis or infiltrate is noted with small pleural effusion. Electronically Signed   By: Lynwood Landy Raddle M.D.   On: 08/25/2023 13:31   US  EKG SITE RITE Result Date: 08/25/2023 If Site Rite image not attached, placement could not be confirmed due to current cardiac rhythm.  Scheduled Meds:  budesonide  (PULMICORT ) nebulizer solution  0.5 mg Nebulization BID   Chlorhexidine  Gluconate Cloth  6 each Topical Daily   docusate  100 mg Per Tube BID   feeding supplement  1 Container Oral TID BM   [START ON 08/26/2023] levothyroxine   200 mcg Per Tube QAC breakfast   montelukast   10 mg Per Tube QHS   mupirocin  cream   Topical BID   pantoprazole  (PROTONIX ) IV  40 mg Intravenous Daily   polyethylene glycol  17 g Per Tube Daily   pyridostigmine   30 mg Per Tube 5 X Daily   sodium chloride  flush  10-40 mL Intracatheter Q12H   sodium chloride  flush  3 mL Intravenous Q12H   Continuous Infusions:  cefTRIAXone  (ROCEPHIN )  IV Stopped (08/24/23 2043)   doxycycline  (VIBRAMYCIN ) IV Stopped (08/25/23 1756)   Immune Globulin  10%     propofol  (DIPRIVAN ) infusion 35 mcg/kg/min (08/25/23 1823)    LOS: 7 days   Alejandro Marker, DO [facility] Available via Epic secure chat 7am-7pm After these hours, please refer to  coverage provider listed on [email or web address] 08/25/2023, 6:27 PM

## 2023-08-25 NOTE — Progress Notes (Signed)
 Pt transferred to [facility] 09 with respiratory Therapist pts belongings sent with pt. Dentures and cell phone at bedside

## 2023-08-25 NOTE — Progress Notes (Signed)
 When Rn entered room this morning for morning medications pt able to answer when asked what her name was, when nurse attempted to give pt water pt unable to swallow water, pt could sip from straw but water running out of mouth. Pt slowly able to move Bilateral arms with weak Grips bilaterally. Dr, Lorelie notified of pts change. Neurology at bedside

## 2023-08-25 NOTE — Consult Note (Addendum)
 NEUROLOGY CONSULT NOTE   Date of service: August 25, 2023 Patient Name: [name] MRN:  [id number] DOB:  Apr 02, 1965 Chief Complaint: myasthenia gravis exacerbation Requesting Provider: Sherrill Alejandro Donovan, DO  History of Present Illness  [name] is a 58 y.o. female with hx of  Musk negative myasthenia gravis, moderate persistent asthma, OSA on CPAP, chronic hypoxic and hypercarbic respiratory failure on 2L [place] PTA, history of PE not AC, HTN, hypothyroidism, depression, admitted for acute on chronic respiratory failure due sepsis due to bilateral lower lobe pneumonia on CAP coverage. Her course has been complicated by splenic laceration and subcapsular hematoma. Neurology was consulted for worsening respiratory effort and generalized weakness concerning for evolution into myasthenia crisis.  Patient is unable to participate in conversation. Information below was collected from chart review, RN and hospitalist at bedside, and physical examination.  Since admission, patient has been HDS with improvement in oral intake, respiratory effort, and was deemed close to ready for discharge to SNF as of 8/5. However, this AM, patient seemed confused, did not respond to questions despite being able to follow directions, was not able to swallow pills or foods, and has had poor coughing effort. Respiratory therapy attempted NIF this AM and was not able. The last one done was yesterday with a best efforts of -30 out of 3 attempts.  Of note, patient did not wear her cPAP machine from Tuesday night into Wednesday AM per chart and RN review. She has not received Magnesium  or steroids during this hospitalization. Last time she received pyridostigmine  was overnight. Was not able to take AM doses of her scheduled medications because weakness today.   Many of her daily labs are still pending. Thus far, her CBC shows that her  leukocytosis has improved. However, arterial blood gas with a CO2 of 103.  Respiratory is currently at bedside repeating NIF, VC, and transitioning patient to BiPAP for hypercarbia.  She is followed at the [facility] clinic. Last seen in 2023.At the time she was continued on Pyridostigmine  tables 30 mg 5x per day. Mycophenolate  1000 mg BID. She has been receiving her medications refills through the same office per medication dispense history.  Patient feels uncomfortable, scared, and anxious with difficulty to breath. Endorses weakness and would like family notified. She has not been recently intubated. Discussed the need for ICU admission and potential intubation. If needed, she would like to pursue. Mentioned Full Code status, and she agreed.     ROS  Per HPI  Past History   Past Medical History:  Diagnosis Date   Asthma    Depression    Heart murmur    Myasthenia gravis (HCC)    Obesity    Pulmonary embolism (HCC)    Sleep apnea    Thyroid  disease     Past Surgical History:  Procedure Laterality Date   BREAST SURGERY  06/2019   breast reduction   THYROID  SURGERY     TONSILLECTOMY     TUBAL LIGATION      Family History: History reviewed. No pertinent family history.  Social History  reports that she has quit smoking. She has never used smokeless tobacco. She reports current alcohol use. She reports that she does not use drugs.  No Known Allergies  Medications   Current Facility-Administered Medications:    acetaminophen  (TYLENOL ) tablet 650 mg, 650 mg, Oral, Q6H PRN, 650 mg at 08/23/23 1215 **OR** acetaminophen  (TYLENOL ) suppository 650 mg, 650 mg, Rectal, Q6H PRN, Tobie, Vishal R,  MD   albuterol  (PROVENTIL ) (2.5 MG/3ML) 0.083% nebulizer solution 2.5 mg, 2.5 mg, Nebulization, Q6H PRN, Tobie, Vishal R, MD   ARIPiprazole  (ABILIFY ) tablet 2 mg, 2 mg, Oral, Daily, Patel, Vishal R, MD, 2 mg at 08/25/23 9177   budesonide  (PULMICORT ) nebulizer solution 0.25 mg, 0.25 mg, Nebulization, BID, Rashid, Farhan, MD, 0.25 mg at 08/25/23 9191    cefTRIAXone  (ROCEPHIN ) 2 g in sodium chloride  0.9 % 100 mL IVPB, 2 g, Intravenous, Q24H, Tobie Bloch R, MD, Last Rate: 200 mL/hr at 08/24/23 1754, 2 g at 08/24/23 1754   doxycycline  (VIBRAMYCIN ) 100 mg in sodium chloride  0.9 % 250 mL IVPB, 100 mg, Intravenous, Q12H, Dino Antu, MD, Last Rate: 125 mL/hr at 08/25/23 0241, 100 mg at 08/25/23 0241   famotidine  (PEPCID ) IVPB 20 mg premix, 20 mg, Intravenous, Q12H, Dino Antu, MD, Last Rate: 100 mL/hr at 08/25/23 0825, 20 mg at 08/25/23 0825   feeding supplement (BOOST / RESOURCE BREEZE) liquid 1 Container, 1 Container, Oral, TID BM, Signe Mitzie LABOR, MD, 1 Container at 08/24/23 1335   guaiFENesin  (MUCINEX ) 12 hr tablet 600 mg, 600 mg, Oral, BID, Patel, Vishal R, MD, 600 mg at 08/25/23 9178   hydrALAZINE  (APRESOLINE ) injection 10 mg, 10 mg, Intravenous, Q6H PRN, Rashid, Farhan, MD   levothyroxine  (SYNTHROID ) tablet 200 mcg, 200 mcg, Oral, QAC breakfast, Patel, Vishal R, MD, 200 mcg at 08/25/23 0518   losartan  (COZAAR ) tablet 50 mg, 50 mg, Oral, Daily, Patel, Vishal R, MD, 50 mg at 08/25/23 9178   mirtazapine  (REMERON ) tablet 15 mg, 15 mg, Oral, QHS, Patel, Vishal R, MD, 15 mg at 08/24/23 2330   montelukast  (SINGULAIR ) tablet 10 mg, 10 mg, Oral, QHS, Patel, Vishal R, MD, 10 mg at 08/24/23 2330   mupirocin  cream (BACTROBAN ) 2 %, , Topical, BID, Rashid, Farhan, MD, Given at 08/25/23 9177   mycophenolate  (CELLCEPT ) capsule 1,000 mg, 1,000 mg, Oral, BID, Rashid, Farhan, MD, 1,000 mg at 08/25/23 9178   ondansetron  (ZOFRAN ) tablet 4 mg, 4 mg, Oral, Q6H PRN **OR** ondansetron  (ZOFRAN ) injection 4 mg, 4 mg, Intravenous, Q6H PRN, Tobie Bloch SAUNDERS, MD, 4 mg at 08/23/23 0542   pregabalin  (LYRICA ) capsule 200 mg, 200 mg, Oral, BID, Patel, Vishal R, MD, 200 mg at 08/25/23 9178   pyridostigmine  (MESTINON ) tablet 30 mg, 30 mg, Oral, 5 X Daily, Tobie Bloch R, MD, 30 mg at 08/25/23 9177   senna-docusate (Senokot-S) tablet 1 tablet, 1 tablet, Oral, QHS PRN, Tobie Bloch SAUNDERS, MD   sodium chloride  flush (NS) 0.9 % injection 3 mL, 3 mL, Intravenous, Q12H, Tobie Bloch R, MD, 3 mL at 08/24/23 2330  Vitals   Vitals:   08/24/23 2352 08/25/23 0359 08/25/23 0753 08/25/23 0808  BP: (!) 141/60 (!) 131/57 (!) 140/58   Pulse: 97 90 89   Resp: 18 16 (!) 24   Temp: 97.7 F (36.5 C) (!) 97.5 F (36.4 C) 98.2 F (36.8 C)   TempSrc: Axillary Axillary Oral   SpO2: 100% 98% 98% 98%  Height:        Body mass index is 70.31 kg/m.   Physical Exam   Constitutional: Woman recumbent in bed in mild distress, unable to phonate but communicates with yes and no movements of the head Psych: flat mood and affect Eyes: No scleral injection. Outward deviation of the R eye. No eye convergence at baseline HENT: Moist mucus membranes. Unable to open her mouth widely. Poor cough effort and sonorous sounds in the posterior oropharyx. Mild effort  Head: Normocephalic. atraumatic Cardiovascular:  Mild tachycardia Respiratory: Difficulty holding a deep breath. Poor inspiratory and expiratory effort GI: Soft.  There is no tenderness.  Extremities: significant pain when touching extremities. Skin: WDI. Warm and dry  Neurologic Examination   Neurologic exam: Mental status: A&Ox3 Cranial Nerves:             II: PERRL             III, IV, VI: external deviation of R eye. Able to loop up with mild fatigability ~15 sec             V, VII: Face symmetric, sensation intact in all 3 divisions               VIII: hearing normal to voice             IX, X: unable to examine the oropharynx             XI: Head turn and shoulder shrug normal bilaterally               XII: tongue midline    Motor: Strength 4/5 on distal upper and lower extremities. Able to hold arms up for about 10 second with shift and deviation. Fatigability on the proximal lower extremities.  Gait: deferred  Sensory: Light touch intact and symmetric bilaterally. Increased pain with light touch on lower  extremities. Coordination: Unable to assess. Psychiatric: Normal mood and affect   Labs/Imaging/Neurodiagnostic studies   CBC:  Recent Labs  Lab Sep 16, 2023 0359 08/25/23 0315  WBC 9.4 10.9*  NEUTROABS 7.4 9.2*  HGB 10.6* 11.3*  HCT 36.7 39.4  MCV 102.2* 101.8*  PLT 89* 109*   Basic Metabolic Panel:  Lab Results  Component Value Date   NA 146 (H) 09/16/23   K 4.7 September 16, 2023   CO2 42 (H) 09/16/23   GLUCOSE 89 09-16-2023   BUN 8 09/16/2023   CREATININE 0.47 09-16-23   CALCIUM  9.4 Sep 16, 2023   GFRNONAA >60 09-16-23   GFRAA >60 12/21/2018   Lipid Panel: No results found for: LDLCALC HgbA1c:  Lab Results  Component Value Date   HGBA1C 6.0 (H) 08/16/2016   Urine Drug Screen: No results found for: LABOPIA, COCAINSCRNUR, LABBENZ, AMPHETMU, THCU, LABBARB  Alcohol Level No results found for: [facility] INR  Lab Results  Component Value Date   INR 1.2 08/19/2023   APTT No results found for: APTT AED levels: No results found for: PHENYTOIN, ZONISAMIDE, LAMOTRIGINE, LEVETIRACETA  CT Head without contrast(Personally reviewed): NA  CT angio Head and Neck with contrast(Personally reviewed): NA  MR Angio head without contrast and Carotid Duplex BL(Personally reviewed): NA  MRI Brain(Personally reviewed): NA   ASSESSMENT   [name] is a 58 y.o. female with hx of  Musk negative myasthenia gravis, moderate persistent asthma, OSA on CPAP, chronic hypoxic and hypercarbic respiratory failure on 2L [place] PTA, history of PE not AC, HTN, hypothyroidism, depression, admitted for acute on chronic respiratory failure due sepsis due to bilateral lower lobe pneumonia on CAP coverage. Her course has been complicated by splenic laceration and subcapsular hematoma. Neurology was consulted for worsening respiratory effort and generalized weakness concerning for evolution into myasthenia crisis.  Mysthenia gravis exacerbation MuSK negative, ACHR positive  myasthenia gravis: S/p thymomectomy in 1991. Last seen at Myasthenia clinic in 2023. Patient was continued on Mestinon  30 mg 5 times daily and Cellcept  1000 mg BID. No medications escalation as she had uncontrolled comorbid conditions. ALD score 8 at the  time. Here, her current presentation is concerning for an evolving myasthenia crisis. Her NIFs are declining, though, her exam is limited by poor lip seal. Mild bulbar symptoms, increased bronchial secretions, generalized weakness. Thus clinically she is worsening. No medications triggers identified. Likely this is secondary to her resolving infection and decreased reserve.  Her most recent NIF score -20 but unable to do VC. For now, will switch medications to IV, continue trial of noninvasive respiratory support,  trial IVIG when patient has been moved to ICU for monitoring.   Acute on chronic hypoxic hypercarbic respiratory failure due to pneumonia in the context of OSA, chronic asthma: Hypercarbic today, but bicarb elevated. Suspect her acute hypercarbia is a bi-product of myasthenia exacerbation. Patient not encephalopathic; trialing BiPAP currently. However, this patient with decreased lung reserve has high risk of rapid decompensation with rapidity of CO2 retention. She will need CCM consult for higher level of care for observation.   Concern for encephalopathy: Currently not encephalopathic. Her presentation is most consistent with exacerbation of MG likely leading to CO2 narcosis. She is following commands and responding yes/no questions appropriately. No anatomic neuro deficits concerning for infarct. Will DC CT head without contrast at this time. Manage issues above and per primary team.  Other active medical conditions below per primary team: Ileus Moerate persistent asthma Hypertension Thrombocytopenia Hypothyroidism Depression OSA Obesity  RECOMMENDATIONS  - CCM consult for evaluation and IVIG once in the ICU  High risk for volume  overload and worsening CO retention  Monitor while in BiPAP; high risk for decompensation and becoming obtunded  Ok to trial BiPAP while starting IVGI unless there is continued worsening on NIF monitoring  If worsening, patient may need elective intubation - Every 4-6 hours NIFs and VC  Once stable for 24 hours, can de-escalate to every 6 hours > every 12 hours - NPO until swallow eval, ordered - Aspiration precautions - Replete electrolytes - Mestinon  PO 30mg  IV equivalent. Ordered 1 mg IV 5 times per day.(30mg  PO is equivalent to 1mg  IV) - Switch medications to IV until able to take PO; discussed with pharmacy - No steroids during acute exacerbation.  Also hold CellCept  for now.  Will resume CellCept  in a couple of days depending on how her response is to IVIG - Discontinued head CT - Patient remains Full code - Recommend elective intubation for respiratory compromise if VC falls below 15 to 20 mL/kg and or NIFs falls below -20cm/H2O. If poor effort and not sure, can always get ABG to assess for CO2 retention. Elective intubation for airway cmopromise if she has difficulty clearing her secretions. Oxygen saturation should not be used to make decision regarding intubation. -Will order IVIG - Medications that may worsen or trigger MG exacerbation: Class IA antiarrhythmics, magnesium , flouroquinolones, macrolides, aminoglycosides, penicillamine, curare, interferon alpha, botox, quinine. Use with caution: calcium  channel blocker, beta blockers and statins.  ______________________________________________________________________   Signed, Hadassah Ala, MD [facility]   Attending Neurohospitalist Addendum Patient seen and examined with APP/Resident. Agree with the history and physical as documented above. Agree with the plan as documented, which I helped formulate. I have independently reviewed the chart, obtained history, review of  systems and examined the patient.I have personally reviewed pertinent head/neck/spine imaging (CT/MRI). Please feel free to call with any questions.  -- Eligio Lav, MD Neurologist [facility] Pager: [phone]

## 2023-08-25 NOTE — Progress Notes (Signed)
 Patient was transported from 4E to 10M09 on BIPAP without any complications. 10M RT is aware that patient has arrived.

## 2023-08-25 NOTE — Consult Note (Signed)
 NAME:  [name], MRN:  [id number], DOB:  01-23-1965, LOS: 7 ADMISSION DATE:  08/18/2023, CONSULTATION DATE:  08/25/23 REFERRING MD:  Sherrill CHIEF COMPLAINT:  AMS   History of Present Illness:  [name] is a 58 y.o. 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  She was admitted on 08/18/2023 for sepsis due to bilateral lower lobe pneumonia.  She was started on ceftriaxone  and doxycycline .  During admission, she was incidentally found to have splenic laceration and subcapsular hematoma for which she was seen by general surgery who opted for conservative management.  On 08/25/2023, she had change in her mental status along with complaints of increasing weakness, increased secretions, inability to swallow.  Her NIF was repeated and was -20 (-30 the day prior): Though, there was concern for suboptimal seal.  She had apparently not worn her CPAP overnight and she also had only received her last dose of pyridostigmine  overnight as she was unable to swallow appropriately on morning of 8/6.  She had an ABG which demonstrated worsening hypoxic and hypercapnic respiratory failure (7.3 2/103/74).  She was placed on BiPAP but due to concerns for worsening bulbar symptoms and impending respiratory failure, PCCM was asked to see in consultation for transfer to ICU.  Neurology has seen her already and is opting for IVIG when she transferred to the ICU.  Her oral medications including pyridostigmine  and mycophenolate  are being transitioned from oral to IV.  At the time of our evaluation, she is on BiPAP but has limited air entry.  She does awaken to voice and follows some simple commands but is slow to respond.  Pertinent  Medical History:  has Asthma exacerbation; GERD  (gastroesophageal reflux disease); Depression; Anxiety; Myasthenia gravis (HCC); Sleep apnea; Pulmonary emboli (HCC); Morbid obesity (HCC); Hypothyroidism; History of pulmonary embolus (PE); Sepsis due to pneumonia (HCC); Acute on chronic respiratory failure with hypoxia and hypercapnia (HCC); Elevated troponin; Thrombocytopenia (HCC); Moderate persistent asthma; and Splenic laceration, initial encounter on their problem list.   Significant Hospital Events: Including procedures, antibiotic start and stop dates in addition to other pertinent events   7/30 admit. 8/6 worsening bulbar symptoms, neuro and PCCM consulted, transferred to ICU.  Interim History / Subjective:  On BiPAP at 10/5. Minimal air entry. ICU transfer awaiting.  Objective:  Blood pressure (!) 140/58, pulse 80, temperature 98.2 F (36.8 C), temperature source Oral, resp. rate (!) 27, height 5' (1.524 m), SpO2 96%.    Vent Mode: BIPAP FiO2 (%):  [40 %] 40 % PEEP:  [5 cmH20] 5 cmH20   Intake/Output Summary (Last 24 hours) at 08/25/2023 1207 Last data filed at 08/25/2023 1043 Gross per 24 hour  Intake 60 ml  Output 1500 ml  Net -1440 ml   There were no vitals filed for this visit.   Physical Exam: General: Adult female, resting in bed, in NAD. Neuro: Awake but slow to respond to questions, does follow some basic commands after repeated asking. HEENT: [place]/AT. Sclerae anicteric. BiPAP in place. Cardiovascular: RRR, no M/R/G.  Lungs: Respirations shallow and unlabored.  Diminished air entry bilaterally. Abdomen: Obese. BS x 4, soft, NT/ND.  Musculoskeletal: No gross deformities, no edema.    Assessment & Plan:   Myasthenia gravis exacerbation with concern for impending crisis. - Transfer to the  ICU. - Likely to proceed with early elective intubation given progressive bulbar symptoms. - Neurology following, appreciate the assistance. - Continue Mestinon , CellCept . - IVIG per Neuro after arrival to ICU.  Acute on  chronic hypoxic and hypercarbic respiratory failure -only on BiPAP, but at risk for aspiration and also worsening status if true myasthenia crisis. Bilateral lower lobe pneumonia - COVID, flu, RSV all negative. Hx of OSA on CPAP. Hx asthma. - Transfer to ICU as above for probable early elective intubation. - Continue empiric ceftriaxone , doxycycline . - Follow cultures. - Bronchial hygiene. - Resume nocturnal CPAP once extubated. - Continue Singulair , albuterol .  Hx HTN. - Hold antihypertensives for now.  Hx hypothyroidism. - Continue PTA Synthroid .  Hx depression. - Hold PTA Abilify  and Remeron .  Hx Obesity. - Hold PTA Tzhncb.  Nontraumatic splenic laceration - evaluated by general surgery who initially recommended supportive care and has signed off on 8/5. - Continue supportive care and close monitoring.  Best practice (evaluated daily):  Diet/type: NPO DVT prophylaxis: SCD Pressure ulcer(s): pressure ulcer assessment deferred  GI prophylaxis: PPI Lines: N/A Foley:  N/A Code Status:  full code Last date of multidisciplinary goals of care discussion: None yet.  Labs   CBC: Recent Labs  Lab 08/20/23 0937 08/21/23 1129 08/22/23 0310 08/23/23 0825 08/24/23 0359 08/25/23 0315  WBC 18.6* 14.2* 12.2* 9.3 9.4 10.9*  NEUTROABS 16.5*  --  9.6* 7.4 7.4 9.2*  HGB 11.6* 11.0* 10.8* 11.1* 10.6* 11.3*  HCT 38.1 37.1 36.5 37.9 36.7 39.4  MCV 98.2 100.0 100.0 100.8* 102.2* 101.8*  PLT 67* 73* 79* 80* 89* 109*    Basic Metabolic Panel: Recent Labs  Lab 08/21/23 1129 08/22/23 0310 08/23/23 0825 08/24/23 0359 08/25/23 0909  NA 142 143 145 146* 146*  K 4.5 4.3 5.4* 4.7 4.6  CL 98 99 100 99 95*  CO2 35* 36* 35* 42* 42*  GLUCOSE 104* 91 95 89 105*  BUN 12 11 8 8 8   CREATININE 0.52 0.44 0.48 0.47 0.49  CALCIUM  9.4 9.2 9.2 9.4 10.0  MG  --  2.1  --   --  2.0  PHOS  --   --   --   --  3.1   GFR: CrCl cannot be calculated (Unknown ideal weight.). Recent Labs  Lab  08/18/23 1750 08/18/23 1916 08/18/23 2116 08/19/23 0212 08/19/23 0214 08/20/23 9062 08/22/23 0310 08/23/23 0825 08/24/23 0359 08/25/23 0315  WBC  --   --   --    < >  --    < > 12.2* 9.3 9.4 10.9*  LATICACIDVEN 1.0 2.6* 0.8  --  0.5  --   --   --   --   --    < > = values in this interval not displayed.    Liver Function Tests: Recent Labs  Lab 08/18/23 1748 08/19/23 0212 08/25/23 0909  AST 59* 43* 18  ALT 13 13 12   ALKPHOS 52 50 51  BILITOT 1.2 1.0 1.0  PROT 6.6 6.9 7.1  ALBUMIN 3.2* 3.0* 2.9*   No results for input(s): LIPASE, AMYLASE in the last 168 hours. Recent Labs  Lab 08/25/23 0909  AMMONIA 65*    ABG    Component Value Date/Time   PHART 7.32 (L) 08/25/2023 0910   PCO2ART 103 (HH) 08/25/2023 0910   PO2ART 74 (L) 08/25/2023 0910   HCO3 53.6 (H) 08/25/2023 0910   TCO2 44 (H) 08/18/2023 1905   O2SAT 96.1 08/25/2023 0910     Coagulation  Profile: Recent Labs  Lab 08/18/23 1748 08/19/23 0212  INR 1.2 1.2    Cardiac Enzymes: No results for input(s): CKTOTAL, CKMB, CKMBINDEX, TROPONINI in the last 168 hours.  HbA1C: Hgb A1c MFr Bld  Date/Time Value Ref Range Status  08/16/2016 08:28 AM 6.0 (H) 4.8 - 5.6 % Final    Comment:    (NOTE)         Pre-diabetes: 5.7 - 6.4         Diabetes: >6.4         Glycemic control for adults with diabetes: <7.0     CBG: Recent Labs  Lab 08/25/23 1156  GLUCAP 103*    Review of Systems:   Unable to obtain as pt is encephalopathic.  Past Medical History:  She,  has a past medical history of Asthma, Depression, Heart murmur, Myasthenia gravis (HCC), Obesity, Pulmonary embolism (HCC), Sleep apnea, and Thyroid  disease.   Surgical History:   Past Surgical History:  Procedure Laterality Date   BREAST SURGERY  06/2019   breast reduction   THYROID  SURGERY     TONSILLECTOMY     TUBAL LIGATION       Social History:   reports that she has quit smoking. She has never used smokeless tobacco. She  reports current alcohol use. She reports that she does not use drugs.   Family History:  Her family history is not on file.   Allergies No Known Allergies   Home Medications  Prior to Admission medications   Medication Sig Start Date End Date Taking? Authorizing Provider  acetaminophen  (TYLENOL ) 500 MG tablet Take 500 mg by mouth every 6 (six) hours as needed for mild pain.   Yes [provider]  albuterol  (PROVENTIL ) (2.5 MG/3ML) 0.083% nebulizer solution Take 2.5 mg by nebulization every 6 (six) hours as needed for wheezing or shortness of breath.   Yes [provider]  albuterol  (VENTOLIN  HFA) 108 (90 Base) MCG/ACT inhaler Inhale 2 puffs into the lungs every 4 (four) hours as needed for wheezing or shortness of breath.  05/28/16  Yes [provider]  ARIPiprazole  (ABILIFY ) 2 MG tablet Take 2 mg by mouth daily.   Yes [provider]  cyanocobalamin (VITAMIN B12) 1000 MCG tablet Take 1 tablet by mouth daily. 11/16/22 11/16/23 Yes [provider]  famotidine  (PEPCID ) 20 MG tablet Take 1 tablet (20 mg total) by mouth 2 (two) times daily. 03/09/18  Yes Henderly, Britni A, PA-C  ferrous sulfate 324 MG TBEC Take 324 mg by mouth daily with breakfast.   Yes [provider]  levothyroxine  (SYNTHROID ) 200 MCG tablet Take 200 mcg by mouth daily before breakfast. 11/16/22 11/16/23 Yes [provider]  losartan  (COZAAR ) 50 MG tablet Take 50 mg by mouth daily.   Yes [provider]  mirtazapine  (REMERON ) 15 MG tablet Take 15 mg by mouth at bedtime. 08/16/23 08/15/24 Yes [provider]  montelukast  (SINGULAIR ) 10 MG tablet Take 10 mg by mouth at bedtime.   Yes [provider]  mycophenolate  (CELLCEPT ) 500 MG tablet Take 1,000 mg by mouth 2 (two) times daily. 06/01/16  Yes [provider]  pregabalin  (LYRICA ) 200 MG capsule Take 200 mg by mouth 2 (two) times daily. 06/09/23 06/08/24 Yes [provider]   pyridostigmine  (MESTINON ) 60 MG tablet Take 30 mg by mouth 5 (five) times daily.  06/09/16  Yes [provider]  Pyridoxine HCl (VITAMIN B6 PO) Take 1,000 mcg by mouth daily.   Yes [provider]     Critical care time: 40 min.   Sammi Gore, PA - C [facility] For pager details, please see AMION or use Epic chat  After 1900, please call [facility] for cross coverage needs 08/25/2023, 12:07 PM

## 2023-08-25 NOTE — Progress Notes (Signed)
 CRITICAL VALUE STICKER  CRITICAL VALUE: pCO2 103  RECEIVER (on-site recipient of call): Rocky CHRISTELLA Latch, RN   DATE & TIME NOTIFIED: 08/25/2023 9070  MESSENGER (representative from lab): [facility]  MD NOTIFIED: Dr. Sherrill   TIME OF NOTIFICATION: 0930  RESPONSE:  new order for BiPap Respiratory notified

## 2023-08-25 NOTE — Procedures (Signed)
 Intubation Procedure Note  [name]  [id number]  11-21-1965  Date:08/25/23  Time:12:22 PM   Provider Performing:Tahirih Lair Meade    Procedure: Intubation (31500)  Indication(s) Respiratory Failure  Consent Risks of the procedure as well as the alternatives and risks of each were explained to the patient and/or caregiver.  Consent for the procedure was obtained and is signed in the bedside chart   Anesthesia Etomidate  and Rocuronium    Time Out Verified patient identification, verified procedure, site/side was marked, verified correct patient position, special equipment/implants available, medications/allergies/relevant history reviewed, required imaging and test results available.   Sterile Technique Usual hand hygeine, masks, and gloves were used   Procedure Description Patient positioned in bed supine.  Sedation given as noted above.  Patient was intubated with endotracheal tube using Glidescope.  View was Grade 1 full glottis .  Number of attempts was 1.  Colorimetric CO2 detector was consistent with tracheal placement.   Complications/Tolerance None; patient tolerated the procedure well. Chest X-ray is ordered to verify placement.   EBL Minimal   Specimen(s) None   Sammi Meade, PA - C [facility] For pager details, please see AMION or use Epic chat  After 1900, please call [facility] for cross coverage needs 08/25/2023, 12:22 PM

## 2023-08-25 NOTE — Progress Notes (Signed)
Report given to 3M RN. 

## 2023-08-25 NOTE — Progress Notes (Signed)
 Peripherally Inserted Central Catheter Placement  The IV Nurse has discussed with the patient and/or persons authorized to consent for the patient, the purpose of this procedure and the potential benefits and risks involved with this procedure.  The benefits include less needle sticks, lab draws from the catheter, and the patient may be discharged home with the catheter. Risks include, but not limited to, infection, bleeding, blood clot (thrombus formation), and puncture of an artery; nerve damage and irregular heartbeat and possibility to perform a PICC exchange if needed/ordered by physician.  Alternatives to this procedure were also discussed.  Bard Power PICC patient education guide, fact sheet on infection prevention and patient information card has been provided to patient /or left at bedside.    PICC Placement Documentation  PICC Triple Lumen 08/25/23 Left Basilic 49 cm 2 cm (Active)  Indication for Insertion or Continuance of Line Chronic illness with exacerbations (CF, Sickle Cell, etc.) 08/25/23 1545  Exposed Catheter (cm) 2 cm 08/25/23 1545  Site Assessment Clean, Dry, Intact 08/25/23 1545  Lumen #1 Status Flushed;Saline locked;Blood return noted 08/25/23 1545  Lumen #2 Status Flushed;Saline locked;Blood return noted 08/25/23 1545  Lumen #3 Status Flushed;Saline locked;Blood return noted 08/25/23 1545  Dressing Type Transparent;Securing device 08/25/23 1545  Dressing Status Antimicrobial disc/dressing in place;Clean, Dry, Intact 08/25/23 1545  Line Care Connections checked and tightened 08/25/23 1545  Dressing Intervention New dressing;Adhesive placed at insertion site (IV team only) 08/25/23 1545  Dressing Change Due 09/01/23 08/25/23 1545    First attempt to place PICC in left cephalic vein unsuccessful as vessel clamped down while threading in PICC catheter and unable to advance further.   Second attempt to place PICC in left basilic vein successful without any complications noted.     [name] 08/25/2023, 4:02 PM

## 2023-08-25 NOTE — Progress Notes (Signed)
 NIF: -20   Patient was unable to get a good seal on mouthpiece despite multiple coaching attempts.

## 2023-08-25 NOTE — Progress Notes (Signed)
 SLP Cancellation Note  Patient Details Name: BRIDGITTE FELICETTI MRN: [id number] DOB: Sep 03, 1965   Cancelled treatment:       Reason Eval/Treat Not Completed: Medical issues which prohibited therapy. Patient on BiPAP. SLP spoke with MD and RN who advised to check in PM if patient is able to be taken off BiPAP for bedside swallow evaluation.   Norleen IVAR Blase, MA, CCC-SLP Speech Therapy

## 2023-08-26 DIAGNOSIS — J9622 Acute and chronic respiratory failure with hypercapnia: Secondary | ICD-10-CM | POA: Diagnosis not present

## 2023-08-26 DIAGNOSIS — J9621 Acute and chronic respiratory failure with hypoxia: Secondary | ICD-10-CM | POA: Diagnosis not present

## 2023-08-26 DIAGNOSIS — J189 Pneumonia, unspecified organism: Secondary | ICD-10-CM | POA: Diagnosis not present

## 2023-08-26 DIAGNOSIS — G7001 Myasthenia gravis with (acute) exacerbation: Secondary | ICD-10-CM | POA: Diagnosis not present

## 2023-08-26 LAB — CBC
HCT: 32.9 % — ABNORMAL LOW (ref 36.0–46.0)
Hemoglobin: 9.8 g/dL — ABNORMAL LOW (ref 12.0–15.0)
MCH: 30.1 pg (ref 26.0–34.0)
MCHC: 29.8 g/dL — ABNORMAL LOW (ref 30.0–36.0)
MCV: 100.9 fL — ABNORMAL HIGH (ref 80.0–100.0)
Platelets: 120 K/uL — ABNORMAL LOW (ref 150–400)
RBC: 3.26 MIL/uL — ABNORMAL LOW (ref 3.87–5.11)
RDW: 14.3 % (ref 11.5–15.5)
WBC: 10.1 K/uL (ref 4.0–10.5)
nRBC: 0 % (ref 0.0–0.2)

## 2023-08-26 LAB — TRIGLYCERIDES: Triglycerides: 124 mg/dL (ref <150)

## 2023-08-26 LAB — BASIC METABOLIC PANEL WITH GFR
Anion gap: 8 (ref 5–15)
BUN: 12 mg/dL (ref 6–20)
CO2: 43 mmol/L — ABNORMAL HIGH (ref 22–32)
Calcium: 9.3 mg/dL (ref 8.9–10.3)
Chloride: 95 mmol/L — ABNORMAL LOW (ref 98–111)
Creatinine, Ser: 0.61 mg/dL (ref 0.44–1.00)
GFR, Estimated: >60 mL/min (ref >60)
Glucose, Bld: 94 mg/dL (ref 70–99)
Potassium: 3.8 mmol/L (ref 3.5–5.1)
Sodium: 146 mmol/L — ABNORMAL HIGH (ref 135–145)

## 2023-08-26 LAB — GLUCOSE, CAPILLARY
Glucose-Capillary: 95 mg/dL (ref 70–99)
Glucose-Capillary: 94 mg/dL (ref 70–99)
Glucose-Capillary: 98 mg/dL (ref 70–99)
Glucose-Capillary: 98 mg/dL (ref 70–99)
Glucose-Capillary: 113 mg/dL — ABNORMAL HIGH (ref 70–99)

## 2023-08-26 LAB — MAGNESIUM: Magnesium: 1.9 mg/dL (ref 1.7–2.4)

## 2023-08-26 LAB — PHOSPHORUS: Phosphorus: 1.3 mg/dL — ABNORMAL LOW (ref 2.5–4.6)

## 2023-08-26 MED ORDER — SODIUM CHLORIDE 3 % IN NEBU
4.0000 mL | INHALATION_SOLUTION | Freq: Four times a day (QID) | RESPIRATORY_TRACT | Status: AC
  Administered 2023-08-26 – 2023-08-28 (×8): 4 mL via RESPIRATORY_TRACT
  Filled 2023-08-26 (×3): qty 15
  Filled 2023-08-26: qty 4
  Filled 2023-08-26 (×5): qty 15

## 2023-08-26 MED ORDER — THIAMINE MONONITRATE 100 MG PO TABS
100.0000 mg | ORAL_TABLET | Freq: Every day | ORAL | Status: DC
  Administered 2023-08-26 – 2023-08-30 (×6): 100 mg
  Filled 2023-08-26 (×5): qty 1

## 2023-08-26 MED ORDER — PROSOURCE TF20 ENFIT COMPATIBL EN LIQD
60.0000 mL | Freq: Every day | ENTERAL | Status: DC
  Administered 2023-08-26 – 2023-09-02 (×11): 60 mL
  Filled 2023-08-26 (×8): qty 60

## 2023-08-26 MED ORDER — SODIUM CHLORIDE 0.9% FLUSH
3.0000 mL | Freq: Four times a day (QID) | INTRAVENOUS | Status: DC
  Administered 2023-08-26 – 2023-09-07 (×51): 3 mL via INTRAVENOUS

## 2023-08-26 MED ORDER — ALBUTEROL SULFATE (2.5 MG/3ML) 0.083% IN NEBU
2.5000 mg | INHALATION_SOLUTION | Freq: Four times a day (QID) | RESPIRATORY_TRACT | Status: DC
  Administered 2023-08-26 – 2023-08-31 (×26): 2.5 mg via RESPIRATORY_TRACT
  Filled 2023-08-26 (×22): qty 3

## 2023-08-26 MED ORDER — SODIUM CHLORIDE 3 % IN NEBU: 4.0000 mL | INHALATION_SOLUTION | Freq: Four times a day (QID) | RESPIRATORY_TRACT | Status: DC

## 2023-08-26 MED ORDER — POTASSIUM PHOSPHATES 15 MMOLE/5ML IV SOLN
30.0000 mmol | Freq: Once | INTRAVENOUS | Status: AC
  Administered 2023-08-26: 30 mmol via INTRAVENOUS
  Filled 2023-08-26: qty 10

## 2023-08-26 MED ORDER — SENNOSIDES-DOCUSATE SODIUM 8.6-50 MG PO TABS
1.0000 | ORAL_TABLET | Freq: Two times a day (BID) | ORAL | Status: DC
  Administered 2023-08-26 – 2023-08-27 (×4): 1
  Filled 2023-08-26 (×4): qty 1

## 2023-08-26 MED ORDER — ORAL CARE MOUTH RINSE
15.0000 mL | OROMUCOSAL | Status: DC
  Administered 2023-08-26 – 2023-08-29 (×42): 15 mL via OROMUCOSAL

## 2023-08-26 MED ORDER — POLYETHYLENE GLYCOL 3350 17 G PO PACK
17.0000 g | PACK | Freq: Two times a day (BID) | ORAL | Status: DC
  Administered 2023-08-26 – 2023-08-27 (×3): 17 g
  Filled 2023-08-26 (×3): qty 1

## 2023-08-26 MED ORDER — VITAL AF 1.2 CAL PO LIQD
1000.0000 mL | ORAL | Status: DC
  Administered 2023-08-26 – 2023-08-29 (×3): 1000 mL

## 2023-08-26 MED ORDER — ORAL CARE MOUTH RINSE: 15.0000 mL | OROMUCOSAL | Status: DC | PRN

## 2023-08-26 NOTE — Progress Notes (Addendum)
 NAME:  STUTI SANDIN, MRN:  [id number], DOB:  Jun 25, 1965, LOS: 8 ADMISSION DATE:  08/18/2023, CONSULTATION DATE:  08/25/23 REFERRING MD:  Sherrill CHIEF COMPLAINT:  AMS   History of Present Illness:  [name] is a 58 y.o. 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  She was admitted on 08/18/2023 for sepsis due to bilateral lower lobe pneumonia.  She was started on ceftriaxone  and doxycycline .  During admission, she was incidentally found to have splenic laceration and subcapsular hematoma for which she was seen by general surgery who opted for conservative management.  On 08/25/2023, she had change in her mental status along with complaints of increasing weakness, increased secretions, inability to swallow.  Her NIF was repeated and was -20 (-30 the day prior): Though, there was concern for suboptimal seal.  She had apparently not worn her CPAP overnight and she also had only received her last dose of pyridostigmine  overnight as she was unable to swallow appropriately on morning of 8/6.  She had an ABG which demonstrated worsening hypoxic and hypercapnic respiratory failure (7.3 2/103/74).  She was placed on BiPAP but due to concerns for worsening bulbar symptoms and impending respiratory failure, PCCM was asked to see in consultation for transfer to ICU.  Neurology has seen her already and is opting for IVIG when she transferred to the ICU.  Her oral medications including pyridostigmine  and mycophenolate  are being transitioned from oral to IV.  At the time of our evaluation, she is on BiPAP but has limited air entry.  She does awaken to voice and follows some simple commands but is slow to respond.  Pertinent  Medical History:  has Asthma exacerbation; GERD  (gastroesophageal reflux disease); Depression; Anxiety; Myasthenia gravis (HCC); Sleep apnea; Pulmonary emboli (HCC); Morbid obesity (HCC); Hypothyroidism; History of pulmonary embolus (PE); Sepsis due to pneumonia (HCC); Acute on chronic respiratory failure with hypoxia and hypercapnia (HCC); Elevated troponin; Thrombocytopenia (HCC); Moderate persistent asthma; Splenic laceration, initial encounter; and Acute respiratory failure with hypoxia and hypercapnia (HCC) on their problem list.   Significant Hospital Events: Including procedures, antibiotic start and stop dates in addition to other pertinent events   7/30 admit. 8/6 worsening bulbar symptoms, neuro and PCCM consulted, transferred to ICU, intubated.  Interim History / Subjective:  NAEON. Awake on vent, slow to follow commands.  Objective:  Blood pressure (!) 120/47, pulse 76, temperature 99.1 F (37.3 C), temperature source Oral, resp. rate (!) 23, height 5' (1.524 m), weight 130.1 kg, SpO2 95%.    Vent Mode: PRVC FiO2 (%):  [40 %-100 %] 50 % Set Rate:  [20 bmp] 20 bmp Vt Set:  [360 mL] 360 mL PEEP:  [5 cmH20] 5 cmH20 Plateau Pressure:  [16 cmH20-23 cmH20] 16 cmH20   Intake/Output Summary (Last 24 hours) at 08/26/2023 0716 Last data filed at 08/26/2023 0700 Gross per 24 hour  Intake 2992.86 ml  Output 800 ml  Net 2192.86 ml   Filed Weights   08/25/23 1150 08/25/23 1211 08/26/23 0400  Weight: 128.6 kg 128.6 kg 130.1 kg     Physical Exam: General: Adult female, resting in bed, in NAD. Neuro: On Propofol  but does arouse to voice, is slow to follow commands. HEENT: [place]/AT. Sclerae anicteric. ETT in place. Cardiovascular: RRR, no M/R/G.  Lungs: Respirations  shallow and unlabored.  Diminished air entry bilaterally. Abdomen: Obese. BS x 4, soft, NT/ND.  Musculoskeletal: No gross deformities, no edema.    Assessment & Plan:   Myasthenia gravis exacerbation with concern for impending crisis. - Neurology following,  appreciate the assistance. - Continue Mestinon . - Holding PTA CellCept . - Continue IVIG per Neuro.  Acute on chronic hypoxic and hypercarbic respiratory failure - failed BiPAP and required intubation 8/6. Bilateral lower lobe pneumonia - COVID, flu, RSV all negative. Now with complete L sided opacification. Hx of OSA on CPAP. Hx asthma. - Continue full vent support. - Continue empiric ceftriaxone , doxycycline . - Follow cultures. - Bronchial hygiene. - Add BD's, chest PT, hypertonic nebs. - Can consider R lateral decub positioning. - Don't think any role for bronch currently. - Resume nocturnal CPAP once extubated. - Continue Singulair , albuterol .  Hypophosphatemia. - Kphos. - Follow BMP.  Hx HTN. - Hold antihypertensives for now.  Hx hypothyroidism. - Continue PTA Synthroid .  Hx depression. - Hold PTA Abilify  and Remeron .  Hx Obesity. - Hold PTA Tzhncb.  Anemia. - Transfuse for Hgb < 7.  Nontraumatic splenic laceration - evaluated by general surgery who initially recommended supportive care and has signed off on 8/5. - Continue supportive care and close monitoring.  Best practice (evaluated daily):  Diet/type: NPO DVT prophylaxis: SCD Pressure ulcer(s): pressure ulcer assessment deferred  GI prophylaxis: PPI Lines: N/A Foley:  N/A Code Status:  full code Last date of multidisciplinary goals of care discussion: None yet.  Critical care time: 30 min.   Sammi Gore, PA - C [place] For pager details, please see AMION or use Epic chat  After 1900, please call [facility] for cross coverage needs 08/26/2023, 7:16 AM

## 2023-08-26 NOTE — Progress Notes (Signed)
 SLP Cancellation Note  Patient Details Name: [name] MRN: [id number] DOB: 04/26/1965   Cancelled assessment: Pt remains on vent. Will follow for readiness.  Jeda Pardue L. Vona, MA CCC/SLP Clinical Specialist - Acute Care SLP Acute Rehabilitation Services Office number [phone]           Vona Palma Laurice 08/26/2023, 8:06 AM

## 2023-08-26 NOTE — Progress Notes (Signed)
 Initial Nutrition Assessment  DOCUMENTATION CODES:   Morbid obesity  INTERVENTION:  Initiate tube feeding via OGT: Start Vital AF 1.2 at 20ml/hr and advance by 10ml q12h to goal rate of 33ml/hr (1080 ml per day) Prosource TF20 60 ml once daily Provides 1376 kcal, 101 gm protein, 876 ml free water daily  Monitor magnesium  and phosphorus daily x 4 occurrences, MD to replete as needed, as pt is at risk for refeeding syndrome   Thiamine  100mg  x 7 days  Plan for Cortrak tomorrow for ongoing nutrition support.   Discussed with Pharmacy and MD escalating bowel regimen given no documented BM since 7/29  NUTRITION DIAGNOSIS:  Inadequate oral intake related to acute illness (myasthenia gravis) as evidenced by NPO status.  GOAL:   Patient will meet greater than or equal to 90% of their needs  MONITOR:   Vent status, Labs, Weight trends, TF tolerance, I & O's, Skin  REASON FOR ASSESSMENT:   Ventilator, Rounds    ASSESSMENT:   Pt admitted on 7/30 for sepsis d/t bilateral lower lobe PNA and splenic infarct. PMH significant for myasthenia gravis, asthma, OSA on nocturnal CPAP, chronic hypoxic and hypercapnic respiratory failure, HTN, hypothyroidism, depression  Patient is currently intubated on ventilator support MV: 8.8 L/min Temp (24hrs), Avg:98.8 F (37.1 C), Min:98 F (36.7 C), Max:99.1 F (37.3 C)  Patient discussed in rounds.  Plans to remain intubated until cleared by Neuro following completion of IVIG on day 2 of 5 days.    RN reports that pt was having difficulty swallowing prior to intubation secondary to myasthenia gravis. MD amenable to initiation of tube feeding as it is likely pt has been without adequate nutrition throughout the course of admission. Questions whether pt will continue to need additional nutrition support following extubation with recent swallowing difficulties therefore would recommend Cortrak placement for continued EN access. MD amenable to this  plan.   Pt's niece at bedside though is unable to provide any nutrition related history. Notably, per review of chart pt has been on Wegovy.   Unfortunately there is limited documentation of weight history on file to review. Weight documented to be 163.3 kg in October 2024 and current weight noted to be 130.1 kg. Uncertain if prior weight is accurate. Will continue to monitor trends throughout admission.  +edema: non-pitting generalized BUE; mild pitting BLE  Drains/lines: OGT (tip within distal stomach vs proximal duodenum)  Medications: colace BID, miralax  BID, senna BID Drips: Abx Propofol  @ 15.82ml/hr  Labs:  Sodium 146 Phosphorus 1.3 (repletion ordered) CBG's 94-99 x24 hours  NUTRITION - FOCUSED PHYSICAL EXAM:  Flowsheet Row Most Recent Value  Orbital Region No depletion  Upper Arm Region Unable to assess  [non-pitting moderate edema]  Thoracic and Lumbar Region No depletion  Buccal Region Unable to assess  [vent]  Temple Region No depletion  Clavicle Bone Region No depletion  Clavicle and Acromion Bone Region No depletion  Scapular Bone Region No depletion  Dorsal Hand Unable to assess  [handmits]  Patellar Region Unable to assess  [mild pitting edema]  Anterior Thigh Region Unable to assess  Posterior Calf Region Unable to assess    Diet Order:   Diet Order             Diet NPO time specified  Diet effective now                   EDUCATION NEEDS:   No education needs have been identified at this time  Skin:  Skin Assessment: Reviewed RN Assessment  Last BM:  7/29  Height:   Ht Readings from Last 1 Encounters:  08/25/23 5' (1.524 m)    Weight:   Wt Readings from Last 1 Encounters:  08/26/23 130.1 kg    Ideal Body Weight:  45.5 kg  BMI:  Body mass index is 56.02 kg/m.  Estimated Nutritional Needs:   Kcal:  1300-1500  Protein:  90-105g  Fluid:  >/=1.5L  Royce Maris, RDN, LDN Clinical Nutrition See AMiON for contact  information.

## 2023-08-26 NOTE — Evaluation (Addendum)
 Physical Therapy Re-Evaluation Patient Details Name: [name] MRN: [id number] DOB: [birth year] Today's Date: 08/26/2023  History of Present Illness  [name] is a 58 y.o. female who presented with worsening SOB. Found to have PNA and CTA 7/30 w/ apparent splenic lacerations in the midpole region with surrounding subcapsular hematoma. No free fluid in the abdomen or pelvis. No active extravasation of contrast. Worsening respiratory status on 08/25/23, transferred to ICU and intubated. PMHx: obesity, depression, asthma, OSA, myasthenia gravis, chronic hypoxic hypercarbic respiratory failure on 2 L O2, remote history of PE (not on anticoagulation), HTN, hypothyroidism  Clinical Impression  Pt admitted with above diagnosis. Pt able to sit EOB 8 min with mod to CGA for a seconds at  a time. Fatigued quickly. Will need extensive rehab prior to d/c home.  Pt is motivated.Goals unmet as pt has declined and is now on vent.  Revised goals accordingly. Will follow acutely.   Pt currently with functional limitations due to the deficits listed below (see PT Problem List). Pt will benefit from acute skilled PT to increase their independence and safety with mobility to allow discharge.           If plan is discharge home, recommend the following: Two people to help with walking and/or transfers;Assistance with cooking/housework;Help with stairs or ramp for entrance;Assist for transportation   Can travel by private vehicle   No    Equipment Recommendations Wheelchair cushion (measurements PT);Wheelchair (measurements PT);Hospital bed (may need hoyer lift pending progress; bariatric sized equipment, height adjustable to youth height (pt reports 85ft 0 height))  Recommendations for Other Services       Functional Status Assessment       Precautions / Restrictions Precautions Precautions: Fall Recall of Precautions/Restrictions: Impaired Precaution/Restrictions Comments:  intubated Restrictions Weight Bearing Restrictions Per Provider Order: No      Mobility  Bed Mobility Overal bed mobility: Needs Assistance Bed Mobility: Supine to Sit, Sit to Supine Rolling: Max assist   Supine to sit: +2 for physical assistance, Max assist Sit to supine: +2 for physical assistance, Total assist   General bed mobility comments: pt assisted some for raising trunk, HOB up, assist for hips to EOB    Transfers                   General transfer comment: deferred    Ambulation/Gait               General Gait Details: unable at this time  Stairs            Wheelchair Mobility     Tilt Bed    Modified Rankin (Stroke Patients Only)       Balance Overall balance assessment: Needs assistance Sitting-balance support: Feet supported Sitting balance-Leahy Scale: Poor Sitting balance - Comments: mod progressing to CGA statically momentarily, loses balance with coughing                                     Pertinent Vitals/Pain Pain Assessment Pain Assessment: Faces Faces Pain Scale: Hurts a little bit Pain Location: feet Pain Descriptors / Indicators: Discomfort Pain Intervention(s): Limited activity within patient's tolerance, Monitored during session, Repositioned    Home Living Family/patient expects to be discharged to:: Private residence Living Arrangements: Other relatives (sister) Available Help at Discharge: Family;Available 24 hours/day Type of Home: House Home Access: Stairs to enter  Home Layout: One level Home Equipment: Other (comment) (home O2) Additional Comments: sister works 8-5; she has 6 other sisters near by    Prior Function Prior Level of Function : Independent/Modified Independent               ADLs Comments: sister assists with IADLs     Extremity/Trunk Assessment   Upper Extremity Assessment Upper Extremity Assessment: Defer to OT evaluation    Lower Extremity  Assessment Lower Extremity Assessment: RLE deficits/detail;LLE deficits/detail RLE Deficits / Details: grossly 3-/5 LLE Deficits / Details: grossly 3-/5    Cervical / Trunk Assessment Cervical / Trunk Assessment: Other exceptions (weakness) Cervical / Trunk Exceptions: increased body habitus  Communication   Communication Communication: Impaired Factors Affecting Communication: Trach/intubated;Other (comment) (nodding head)    Cognition Arousal: Alert Behavior During Therapy: Flat affect   PT - Cognitive impairments: Sequencing, Problem solving, Attention                         Following commands: Impaired Following commands impaired: Follows one step commands with increased time     Cueing Cueing Techniques: Verbal cues, Gestural cues, Tactile cues     General Comments General comments (skin integrity, edema, etc.): 50% FiO2, PEEP 5 ventilator    Exercises General Exercises - Lower Extremity Heel Slides: AROM, Both, 10 reps, Seated   Assessment/Plan    PT Assessment    PT Problem List         PT Treatment Interventions      PT Goals (Current goals can be found in the Care Plan section)  Acute Rehab PT Goals Patient Stated Goal: to improve mobility PT Goal Formulation: With patient Time For Goal Achievement: 09/09/23 Potential to Achieve Goals: Good    Frequency Min 2X/week     Co-evaluation PT/OT/SLP Co-Evaluation/Treatment: Yes Reason for Co-Treatment: Complexity of the patient's impairments (multi-system involvement);For patient/therapist safety;To address functional/ADL transfers PT goals addressed during session: Mobility/safety with mobility;Balance;Strengthening/ROM OT goals addressed during session: ADL's and self-care;Strengthening/ROM       AM-PAC PT 6 Clicks Mobility  Outcome Measure Help needed turning from your back to your side while in a flat bed without using bedrails?: A Lot Help needed moving from lying on your back to  sitting on the side of a flat bed without using bedrails?: A Lot Help needed moving to and from a bed to a chair (including a wheelchair)?: Total Help needed standing up from a chair using your arms (e.g., wheelchair or bedside chair)?: Total (+2 assist) Help needed to walk in hospital room?: Total Help needed climbing 3-5 steps with a railing? : Total 6 Click Score: 8    End of Session   Activity Tolerance: Patient limited by fatigue Patient left: in bed;with call bell/phone within reach;with bed alarm set;Other (comment) (bed in chair posture with HOB >40 deg to promote tolerance for upright and pulmonary clearance.) Nurse Communication: Mobility status PT Visit Diagnosis: Other abnormalities of gait and mobility (R26.89);Muscle weakness (generalized) (M62.81)    Time: 8995-8972 PT Time Calculation (min) (ACUTE ONLY): 23 min   Charges:   PT Evaluation $PT Re-evaluation: 1 Re-eval   PT General Charges $$ ACUTE PT VISIT: 1 Visit         Hanson Medeiros M,PT Acute Rehab Services [phone]   [name] 08/26/2023, 1:58 PM

## 2023-08-26 NOTE — Progress Notes (Signed)
 eLink Physician-Brief Progress Note Patient Name: [name] DOB: 24-Nov-1965 MRN: [id number]   Date of Service  08/26/2023  HPI/Events of Note  Phos 1.3, K 3.8, Cr 0.61  eICU Interventions  kphos     Intervention Category Minor Interventions: Electrolytes abnormality - evaluation and management  Elijah Phommachanh 08/26/2023, 5:49 AM

## 2023-08-26 NOTE — Evaluation (Signed)
 Occupational Therapy Re-Evaluation Patient Details Name: [name] MRN: [id number] DOB: May 28, 1965 Today's Date: 08/26/2023   History of Present Illness   [name] is a 58 y.o. female who presented with worsening SOB. Found to have PNA and CTA 7/30 w/ apparent splenic lacerations in the midpole region with surrounding subcapsular hematoma. No free fluid in the abdomen or pelvis. No active extravasation of contrast. Worsening respiratory status on 08/25/23, transferred to ICU and intubated. PMHx: obesity, depression, asthma, OSA, myasthenia gravis, chronic hypoxic hypercarbic respiratory failure on 2 L O2, remote history of PE (not on anticoagulation), HTN, hypothyroidism     Clinical Impressions Pt reassessed in ICU on ventilator with pt alert. +2 max to total assist for supine to sit. Pt sat EOB x 8 minutes with moderate to CGA momentarily. Presents with global weakness. She is dependent all ADLs. Pt will need extensive rehab upon discharge.      If plan is discharge home, recommend the following:   Two people to help with walking and/or transfers;Two people to help with bathing/dressing/bathroom;Assistance with cooking/housework;Assistance with feeding;Direct supervision/assist for medications management;Direct supervision/assist for financial management;Assist for transportation;Help with stairs or ramp for entrance     Functional Status Assessment   Patient has had a recent decline in their functional status and/or demonstrates limited ability to make significant improvements in function in a reasonable and predictable amount of time     Equipment Recommendations   Other (comment) (defer)     Recommendations for Other Services         Precautions/Restrictions   Precautions Precautions: Fall Recall of Precautions/Restrictions: Impaired Precaution/Restrictions Comments: intubated Restrictions Weight Bearing Restrictions Per Provider Order: No      Mobility Bed Mobility Overal bed mobility: Needs Assistance Bed Mobility: Supine to Sit, Sit to Supine     Supine to sit: +2 for physical assistance, Max assist Sit to supine: +2 for physical assistance, Total assist   General bed mobility comments: pt assisted some for raising trunk, HOB up, assist for hips to EOB    Transfers                   General transfer comment: deferred      Balance Overall balance assessment: Needs assistance Sitting-balance support: Feet supported Sitting balance-Leahy Scale: Poor Sitting balance - Comments: mod progressing to CGA statically momentarily, loses balance with coughing                                   ADL either performed or assessed with clinical judgement   ADL                                         General ADL Comments: NPO and dependent in all ADLs     Vision Patient Visual Report: No change from baseline Vision Assessment?: Wears glasses for reading Additional Comments: dysconjugate eyes     Perception         Praxis         Pertinent Vitals/Pain Pain Assessment Pain Assessment: Faces Faces Pain Scale: Hurts a little bit Pain Location: feet Pain Descriptors / Indicators: Discomfort Pain Intervention(s): Monitored during session, Repositioned     Extremity/Trunk Assessment Upper Extremity Assessment Upper Extremity Assessment: Generalized weakness   Lower Extremity Assessment Lower Extremity Assessment: Defer to PT  evaluation   Cervical / Trunk Assessment Cervical / Trunk Assessment: Other exceptions (weakness) Cervical / Trunk Exceptions: increased body habitus   Communication Communication Communication: Impaired Factors Affecting Communication: Trach/intubated;Other (comment) (nodding head)   Cognition Arousal: Alert Behavior During Therapy: Flat affect Cognition: Difficult to assess Difficult to assess due to: Intubated           OT - Cognition  Comments: command following improved once pt seated EOB                 Following commands: Impaired Following commands impaired: Follows one step commands with increased time     Cueing  General Comments   Cueing Techniques: Verbal cues;Gestural cues;Tactile cues      Exercises     Shoulder Instructions      Home Living Family/patient expects to be discharged to:: Private residence Living Arrangements: Other relatives (sister) Available Help at Discharge: Family;Available 24 hours/day Type of Home: House Home Access: Stairs to enter     Home Layout: One level     Bathroom Shower/Tub: Chief Strategy Officer: Standard     Home Equipment: Other (comment) (home O2)   Additional Comments: sister works 8-5; she has 6 other sisters near by      Prior Functioning/Environment Prior Level of Function : Independent/Modified Independent               ADLs Comments: sister assists with IADLs    OT Problem List: Decreased activity tolerance;Impaired balance (sitting and/or standing);Decreased strength;Pain;Obesity;Cardiopulmonary status limiting activity   OT Treatment/Interventions: Self-care/ADL training;Therapeutic exercise;DME and/or AE instruction;Cognitive remediation/compensation;Patient/family education;Balance training      OT Goals(Current goals can be found in the care plan section)   Acute Rehab OT Goals OT Goal Formulation: With family Time For Goal Achievement: 09/09/23 Potential to Achieve Goals: Fair ADL Goals Pt Will Perform Grooming: with min assist;sitting Pt Will Perform Upper Body Dressing: with min assist;sitting Additional ADL Goal #1: Pt will complete will bed mobility with moderate assistance as a precursor to ADLs. Additional ADL Goal #2: Pt will demonstrate fair sitting balance x 5 minutes in preparation for ADLs. Additional ADL Goal #3: Pt will stand with +2 mod assist as a precursor to toilet transfers.   OT  Frequency:  Min 2X/week    Co-evaluation PT/OT/SLP Co-Evaluation/Treatment: Yes Reason for Co-Treatment: Complexity of the patient's impairments (multi-system involvement);For patient/therapist safety;To address functional/ADL transfers   OT goals addressed during session: ADL's and self-care;Strengthening/ROM      AM-PAC OT 6 Clicks Daily Activity     Outcome Measure Help from another person eating meals?: Total Help from another person taking care of personal grooming?: Total Help from another person toileting, which includes using toliet, bedpan, or urinal?: Total Help from another person bathing (including washing, rinsing, drying)?: Total Help from another person to put on and taking off regular upper body clothing?: Total Help from another person to put on and taking off regular lower body clothing?: Total 6 Click Score: 6   End of Session Nurse Communication: Mobility status  Activity Tolerance: Patient tolerated treatment well Patient left: in bed;with call bell/phone within reach;with bed alarm set;with family/visitor present  OT Visit Diagnosis: Muscle weakness (generalized) (M62.81)                Time: 8993-8972 OT Time Calculation (min): 21 min Charges:  OT General Charges $OT Visit: 1 Visit OT Evaluation $OT Re-eval: 1 Re-eval  Mliss HERO, OTR/L Acute Rehabilitation Services Office: [phone]  Kennth Mliss Helling 08/26/2023, 10:44 AM

## 2023-08-26 NOTE — Progress Notes (Addendum)
 NEUROLOGY CONSULT FOLLOW UP NOTE   Date of service: August 26, 2023 Patient Name: [name] MRN:  [id number] DOB:  [birth year]  Interval Hx/subjective   Elective intubation on 8/7. She continues on propofol . Patient was resting comfortably. Sisters at bedside, inquiring about sequence of events that lead to intubation. They also reiterated that Mr. Fairbairn had not followed with Myasthenia Gravis clinic at [facility]. They would like to seek MG care here in [place] Vitals   Vitals:   08/26/23 0600 08/26/23 0630 08/26/23 0700 08/26/23 0733  BP: (!) 109/44 (!) 116/45 (!) 120/47   Pulse: 76 75 76   Resp: (!) 21 (!) 22 (!) 23   Temp:    98.9 F (37.2 C)  TempSrc:    Oral  SpO2: 93% 95% 95%   Weight:      Height:         Body mass index is 56.02 kg/m.  Physical Exam   Constitutional: Appears well-developed and well-nourished.  Eyes: No scleral injection.  HENT: intubated and sedated Head: Normocephalic.  Cardiovascular: Normal rate and regular rhythm.  Respiratory: Effort normal, non-labored breathing. On ventilator GI: Soft.  Skin: warm and dry  Neurologic Examination   Neurologic exam: Mental status: sedated  Pupils equal in size and reactive to light. + gag and cough reflexes. Withdraws from pain at upper and lower extremities.   Medications  Current Facility-Administered Medications:    acetaminophen  (TYLENOL ) tablet 650 mg, 650 mg, Per Tube, Q6H PRN **OR** acetaminophen  (TYLENOL ) suppository 650 mg, 650 mg, Rectal, Q6H PRN, Geronimo Amel, MD   albuterol  (PROVENTIL ) (2.5 MG/3ML) 0.083% nebulizer solution 2.5 mg, 2.5 mg, Nebulization, Q6H PRN, Tobie Jorie SAUNDERS, MD   budesonide  (PULMICORT ) nebulizer solution 0.5 mg, 0.5 mg, Nebulization, BID, Desai, Rahul P, PA-C, 0.5 mg at 08/25/23 1956   cefTRIAXone  (ROCEPHIN ) 2 g in sodium chloride  0.9 % 100 mL IVPB, 2 g, Intravenous, Q24H, Tobie Jorie SAUNDERS, MD, Stopped at 08/25/23 1906   Chlorhexidine  Gluconate Cloth 2 % PADS 6  each, 6 each, Topical, Daily, Sherrill Cable [place], DO, 6 each at 08/25/23 1455   docusate (COLACE) 50 MG/5ML liquid 100 mg, 100 mg, Per Tube, BID, Desai, Rahul P, PA-C, 100 mg at 08/25/23 2138   doxycycline  (VIBRAMYCIN ) 100 mg in sodium chloride  0.9 % 250 mL IVPB, 100 mg, Intravenous, Q12H, Rashid, Farhan, MD, Stopped at 08/26/23 0441   feeding supplement (BOOST / RESOURCE BREEZE) liquid 1 Container, 1 Container, Oral, TID BM, Signe Mitzie LABOR, MD, 1 Container at 08/24/23 1335   fentaNYL  (SUBLIMAZE ) injection 50 mcg, 50 mcg, Intravenous, Q15 min PRN, Desai, Rahul P, PA-C   fentaNYL  (SUBLIMAZE ) injection 50-200 mcg, 50-200 mcg, Intravenous, Q30 min PRN, Desai, Rahul P, PA-C, 50 mcg at 08/25/23 1416   hydrALAZINE  (APRESOLINE ) injection 10 mg, 10 mg, Intravenous, Q6H PRN, Dino Antu, MD   Immune Globulin  10% (PRIVIGEN ) IV infusion 30 g, 400 mg/kg (Adjusted), Intravenous, Q24 Hr x 5, Voncile Isles, MD, 30 g at 08/25/23 1745   levothyroxine  (SYNTHROID ) tablet 200 mcg, 200 mcg, Per Tube, QAC breakfast, Meade, Rahul P, PA-C, 200 mcg at 08/26/23 9381   midazolam  (VERSED ) injection 1-2 mg, 1-2 mg, Intravenous, Q1H PRN, Desai, Rahul P, PA-C   montelukast  (SINGULAIR ) tablet 10 mg, 10 mg, Per Tube, QHS, Desai, Rahul P, PA-C, 10 mg at 08/25/23 2138   mupirocin  cream (BACTROBAN ) 2 %, , Topical, BID, Rashid, Farhan, MD, Given at 08/25/23 2302   ondansetron  (ZOFRAN ) tablet 4 mg, 4 mg, Per  Tube, Q6H PRN **OR** ondansetron  (ZOFRAN ) injection 4 mg, 4 mg, Intravenous, Q6H PRN, Geronimo Amel, MD   Oral care mouth rinse, 15 mL, Mouth Rinse, Q2H, Paliwal, Aditya, MD, 15 mL at 08/26/23 0600   Oral care mouth rinse, 15 mL, Mouth Rinse, PRN, Paliwal, Aditya, MD   pantoprazole  (PROTONIX ) injection 40 mg, 40 mg, Intravenous, Daily, Desai, Rahul P, PA-C, 40 mg at 08/25/23 1455   polyethylene glycol (MIRALAX  / GLYCOLAX ) packet 17 g, 17 g, Per Tube, Daily, Desai, Rahul P, PA-C, 17 g at 08/25/23 1455   potassium PHOSPHATE   30 mmol in dextrose 5 % 500 mL infusion, 30 mmol, Intravenous, Once, Paliwal, Aditya, MD, Last Rate: 85 mL/hr at 08/26/23 0711, 30 mmol at 08/26/23 0711   propofol  (DIPRIVAN ) 1000 MG/100ML infusion, 0-80 mcg/kg/min, Intravenous, Continuous, Desai, Rahul P, PA-C, Last Rate: 15.43 mL/hr at 08/26/23 0700, 20 mcg/kg/min at 08/26/23 0700   pyridostigmine  (MESTINON ) tablet 30 mg, 30 mg, Per Tube, 5 X Daily, Nathon Stefanski, MD, 30 mg at 08/26/23 9380   senna-docusate (Senokot-S) tablet 1 tablet, 1 tablet, Per Tube, QHS PRN, Meade, Rahul P, PA-C   sodium chloride  flush (NS) 0.9 % injection 10-40 mL, 10-40 mL, Intracatheter, Q12H, Geronimo Amel, MD, 40 mL at 08/25/23 2130   sodium chloride  flush (NS) 0.9 % injection 10-40 mL, 10-40 mL, Intracatheter, PRN, Geronimo Amel, MD   sodium chloride  flush (NS) 0.9 % injection 3 mL, 3 mL, Intravenous, Q12H, Tobie Bloch R, MD, 3 mL at 08/25/23 2131  Labs and Diagnostic Imaging   CBC:  Recent Labs  Lab 08/24/23 0359 08/25/23 0315 08/25/23 1349 08/26/23 0402  WBC 9.4 10.9*  --  10.1  NEUTROABS 7.4 9.2*  --   --   HGB 10.6* 11.3* 11.6* 9.8*  HCT 36.7 39.4 34.0* 32.9*  MCV 102.2* 101.8*  --  100.9*  PLT 89* 109*  --  120*    Basic Metabolic Panel:  Lab Results  Component Value Date   NA 146 (H) 08/26/2023   K 3.8 08/26/2023   CO2 43 (H) 08/26/2023   GLUCOSE 94 08/26/2023   BUN 12 08/26/2023   CREATININE 0.61 08/26/2023   CALCIUM  9.3 08/26/2023   GFRNONAA >60 08/26/2023   GFRAA >60 12/21/2018   Lipid Panel: No results found for: LDLCALC HgbA1c:  Lab Results  Component Value Date   HGBA1C 6.0 (H) 08/16/2016   Urine Drug Screen: No results found for: LABOPIA, COCAINSCRNUR, LABBENZ, AMPHETMU, THCU, LABBARB  Alcohol Level No results found for: [facility] INR  Lab Results  Component Value Date   INR 1.2 08/19/2023   APTT No results found for: APTT AED levels: No results found for: PHENYTOIN, ZONISAMIDE, LAMOTRIGINE,  LEVETIRACETA    Assessment   BREEAN NANNINI is a 58 y.o. female with hx of  Musk negative myasthenia gravis, moderate persistent asthma, OSA on CPAP, chronic hypoxic and hypercarbic respiratory failure on 2L [place] PTA, history of PE not AC, HTN, hypothyroidism, depression, admitted for acute on chronic respiratory failure due sepsis due to bilateral lower lobe pneumonia on CAP coverage. Her course has been complicated by splenic laceration and subcapsular hematoma. Patient had a MG exacerbation for which she was electively intubated and started on IVIG on 8/6   Mysthenia gravis exacerbation MuSK negative, ACHR positive myasthenia gravis: S/p thymomectomy in 1991. Last seen at Myasthenia clinic in 2023. Patient was continued on Mestinon  30 mg 5 times daily and Cellcept  1000 mg BID PTA. Currently intubated and sedated on IVIG day  2 and stable. Will continue the current course of treatment today.  Acute on chronic hypoxic hypercarbic respiratory failure due to pneumonia in the context of OSA, chronic asthma: Antibiotic course per CCM team.   Other active medical conditions per CCM  Recommendations  - Continue to hold Cellcept  for another 24 hrs - Continue Mestinon  30 mg 5 times daily per tube - IVIG day 2 of 5 today - Patient will need to be weaned off sedation for frequent Neuro checks - Start SBTs and NIFs vs MIPs q 12 hrs on 8/8 - Will need outpatient follow up with wither Dr. Tonita Blanch or Dr. Venetia Potters for Myasthenia Gravis - Neurology will continue to follow while inpatient - No steroids for this patient - Medications that may worsen or trigger MG exacerbation: Class IA antiarrhythmics, magnesium , flouroquinolones, macrolides, aminoglycosides, penicillamine, curare, interferon alpha, botox, quinine. Use with caution: calcium  channel blocker, beta blockers and statins.   ______________________________________________________________________   Hadassah Ala, MD [facility]  resident [facility]  Attending Neurohospitalist Addendum Patient seen and examined with APP/Resident. Agree with the history and physical as documented above. Agree with the plan as documented, which I helped formulate. I have independently reviewed the chart, obtained history, review of systems and examined the patient.I have personally reviewed pertinent head/neck/spine imaging (CT/MRI). Please feel free to call with any questions.  -- Eligio Lav, MD Neurologist [facility] Pager: [phone]  CRITICAL CARE ATTESTATION Performed by: Eligio Lav, MD Total critical care time: 33 minutes Critical care time was exclusive of separately billable procedures and treating other patients and/or supervising APPs/Residents/Students Critical care was necessary to treat or prevent imminent or life-threatening deterioration. This patient is critically ill and at significant risk for neurological worsening and/or death and care requires constant monitoring. Critical care was time spent personally by me on the following activities: development of treatment plan with patient and/or surrogate as well as nursing, discussions with consultants, evaluation of patient's response to treatment, examination of patient, obtaining history from patient or surrogate, ordering and performing treatments and interventions, ordering and review of laboratory studies, ordering and review of radiographic studies, pulse oximetry, re-evaluation of patient's condition, participation in multidisciplinary rounds and medical decision making of high complexity in the care of this patient.

## 2023-08-27 ENCOUNTER — Inpatient Hospital Stay (HOSPITAL_COMMUNITY)

## 2023-08-27 DIAGNOSIS — G7001 Myasthenia gravis with (acute) exacerbation: Secondary | ICD-10-CM | POA: Diagnosis not present

## 2023-08-27 DIAGNOSIS — J9611 Chronic respiratory failure with hypoxia: Secondary | ICD-10-CM | POA: Diagnosis not present

## 2023-08-27 DIAGNOSIS — J189 Pneumonia, unspecified organism: Secondary | ICD-10-CM | POA: Diagnosis not present

## 2023-08-27 DIAGNOSIS — J9622 Acute and chronic respiratory failure with hypercapnia: Secondary | ICD-10-CM | POA: Diagnosis not present

## 2023-08-27 DIAGNOSIS — D631 Anemia in chronic kidney disease: Secondary | ICD-10-CM

## 2023-08-27 DIAGNOSIS — J9612 Chronic respiratory failure with hypercapnia: Secondary | ICD-10-CM

## 2023-08-27 DIAGNOSIS — J9621 Acute and chronic respiratory failure with hypoxia: Secondary | ICD-10-CM | POA: Diagnosis not present

## 2023-08-27 DIAGNOSIS — E876 Hypokalemia: Secondary | ICD-10-CM

## 2023-08-27 LAB — BASIC METABOLIC PANEL WITH GFR
Anion gap: 9 (ref 5–15)
BUN: 8 mg/dL (ref 6–20)
CO2: 43 mmol/L — ABNORMAL HIGH (ref 22–32)
Calcium: 9.1 mg/dL (ref 8.9–10.3)
Chloride: 94 mmol/L — ABNORMAL LOW (ref 98–111)
Creatinine, Ser: 0.52 mg/dL (ref 0.44–1.00)
GFR, Estimated: >60 mL/min (ref >60)
Glucose, Bld: 94 mg/dL (ref 70–99)
Potassium: 3.5 mmol/L (ref 3.5–5.1)
Sodium: 146 mmol/L — ABNORMAL HIGH (ref 135–145)

## 2023-08-27 LAB — CBC
HCT: 32.9 % — ABNORMAL LOW (ref 36.0–46.0)
Hemoglobin: 10 g/dL — ABNORMAL LOW (ref 12.0–15.0)
MCH: 29.5 pg (ref 26.0–34.0)
MCHC: 30.4 g/dL (ref 30.0–36.0)
MCV: 97.1 fL (ref 80.0–100.0)
Platelets: 136 K/uL — ABNORMAL LOW (ref 150–400)
RBC: 3.39 MIL/uL — ABNORMAL LOW (ref 3.87–5.11)
RDW: 14.6 % (ref 11.5–15.5)
WBC: 9.3 K/uL (ref 4.0–10.5)
nRBC: 0 % (ref 0.0–0.2)

## 2023-08-27 LAB — GLUCOSE, CAPILLARY
Glucose-Capillary: 103 mg/dL — ABNORMAL HIGH (ref 70–99)
Glucose-Capillary: 111 mg/dL — ABNORMAL HIGH (ref 70–99)
Glucose-Capillary: 85 mg/dL (ref 70–99)
Glucose-Capillary: 87 mg/dL (ref 70–99)
Glucose-Capillary: 94 mg/dL (ref 70–99)
Glucose-Capillary: 94 mg/dL (ref 70–99)
Glucose-Capillary: 97 mg/dL (ref 70–99)

## 2023-08-27 LAB — PHOSPHORUS: Phosphorus: 2.4 mg/dL — ABNORMAL LOW (ref 2.5–4.6)

## 2023-08-27 LAB — MAGNESIUM: Magnesium: 1.7 mg/dL (ref 1.7–2.4)

## 2023-08-27 MED ORDER — MYCOPHENOLATE 200 MG/ML ORAL SUSPENSION
1000.0000 mg | Freq: Two times a day (BID) | ORAL | Status: DC
  Administered 2023-08-27 – 2023-08-30 (×8): 1000 mg
  Filled 2023-08-27 (×8): qty 5

## 2023-08-27 MED ORDER — POTASSIUM CHLORIDE 20 MEQ PO PACK
40.0000 meq | PACK | Freq: Once | ORAL | Status: AC
  Administered 2023-08-27: 40 meq
  Filled 2023-08-27: qty 2

## 2023-08-27 MED ORDER — FUROSEMIDE 10 MG/ML IJ SOLN
40.0000 mg | Freq: Once | INTRAMUSCULAR | Status: AC
  Administered 2023-08-27: 40 mg via INTRAVENOUS
  Filled 2023-08-27: qty 4

## 2023-08-27 MED FILL — Immune Globulin (Human) IV Soln 20 GM/200ML: INTRAVENOUS | Qty: 200 | Status: AC

## 2023-08-27 MED FILL — Immune Globulin (Human) IV Soln 10 GM/100ML: INTRAVENOUS | Qty: 100 | Status: AC

## 2023-08-27 NOTE — Progress Notes (Signed)
 eLink Physician-Brief Progress Note Patient Name: [name] DOB: 1965/11/27 MRN: [id number]   Date of Service  08/27/2023  HPI/Events of Note  K 3.5, Phos 2.4, Cr 0.52, Mg 1.7  eICU Interventions  kcl     Intervention Category Minor Interventions: Electrolytes abnormality - evaluation and management  Laycie Schriner 08/27/2023, 5:28 AM

## 2023-08-27 NOTE — Progress Notes (Signed)
 NEUROLOGY CONSULT FOLLOW UP NOTE   Date of service: August 27, 2023 Patient Name: [name] MRN:  [id number] DOB:  [birth year]  Interval Hx/subjective   Stable overnight Seen and examined  Vitals   Vitals:   08/27/23 1000 08/27/23 1100 08/27/23 1118 08/27/23 1127  BP: (!) 110/49  (!) 93/56   Pulse: 82 85 77   Resp: (!) 21  (!) 21   Temp:    98 F (36.7 C)  TempSrc:    Oral  SpO2: 90% 90% (!) 88%   Weight:      Height:         Body mass index is 54.9 kg/m.  Physical Exam   Constitutional: Appears well-developed and well-nourished.  Eyes: No scleral injection.  HENT: intubated and sedated Head: Normocephalic.  Cardiovascular: Normal rate and regular rhythm.  Respiratory: Effort normal, non-labored breathing. On ventilator GI: Soft.  Skin: warm and dry  Neurologic Examination   Neurologic exam: Off sedation, awake alert Follows commands Disconjugate gaze-that is baseline Pupils equal round reactive to light No ptosis with sustained upward gaze Today she was able to lift both upper extremities antigravity in both lower extremities antigravity for at least 5 to 10 seconds. Sensation intact  Medications  Current Facility-Administered Medications:    acetaminophen  (TYLENOL ) tablet 650 mg, 650 mg, Per Tube, Q6H PRN **OR** acetaminophen  (TYLENOL ) suppository 650 mg, 650 mg, Rectal, Q6H PRN, Geronimo Amel, MD   albuterol  (PROVENTIL ) (2.5 MG/3ML) 0.083% nebulizer solution 2.5 mg, 2.5 mg, Nebulization, Q6H PRN, Tobie Jorie SAUNDERS, MD   albuterol  (PROVENTIL ) (2.5 MG/3ML) 0.083% nebulizer solution 2.5 mg, 2.5 mg, Nebulization, Q6H, Pawar, Rahul, MD, 2.5 mg at 08/27/23 0813   budesonide  (PULMICORT ) nebulizer solution 0.5 mg, 0.5 mg, Nebulization, BID, Desai, Rahul P, PA-C, 0.5 mg at 08/27/23 0813   cefTRIAXone  (ROCEPHIN ) 2 g in sodium chloride  0.9 % 100 mL IVPB, 2 g, Intravenous, Q24H, Pawar, Rahul, MD, Stopped at 08/26/23 1910   Chlorhexidine  Gluconate Cloth 2 %  PADS 6 each, 6 each, Topical, Daily, Sherrill Cable [place], DO, 6 each at 08/27/23 1014   docusate (COLACE) 50 MG/5ML liquid 100 mg, 100 mg, Per Tube, BID, Desai, Rahul P, PA-C, 100 mg at 08/27/23 0942   feeding supplement (PROSource TF20) liquid 60 mL, 60 mL, Per Tube, Daily, Pawar, Rahul, MD, 60 mL at 08/27/23 0941   feeding supplement (VITAL AF 1.2 CAL) liquid 1,000 mL, 1,000 mL, Per Tube, Continuous, Pawar, Rahul, MD, Last Rate: 15 mL/hr at 08/27/23 0600, Infusion Verify at 08/27/23 0600   fentaNYL  (SUBLIMAZE ) injection 50 mcg, 50 mcg, Intravenous, Q15 min PRN, Desai, Rahul P, PA-C   fentaNYL  (SUBLIMAZE ) injection 50-200 mcg, 50-200 mcg, Intravenous, Q30 min PRN, Desai, Rahul P, PA-C, 50 mcg at 08/25/23 1416   hydrALAZINE  (APRESOLINE ) injection 10 mg, 10 mg, Intravenous, Q6H PRN, Rashid, Farhan, MD   Immune Globulin  10% (PRIVIGEN ) IV infusion 30 g, 400 mg/kg (Adjusted), Intravenous, Q24 Hr x 5, Voncile [place]les, MD, 30 g at 08/26/23 1405   levothyroxine  (SYNTHROID ) tablet 200 mcg, 200 mcg, Per Tube, QAC breakfast, Meade, Rahul P, PA-C, 200 mcg at 08/27/23 9478   midazolam  (VERSED ) injection 1-2 mg, 1-2 mg, Intravenous, Q1H PRN, Desai, Rahul P, PA-C   montelukast  (SINGULAIR ) tablet 10 mg, 10 mg, Per Tube, QHS, Desai, Rahul P, PA-C, 10 mg at 08/26/23 2136   mupirocin  cream (BACTROBAN ) 2 %, , Topical, BID, Rashid, Farhan, MD, 1 Application at 08/27/23 0943   mycophenolate  (CELLCEPT ) oral suspension 200 mg/mL,  1,000 mg, Per Tube, BID, Lanesha Azzaro, MD   ondansetron  (ZOFRAN ) tablet 4 mg, 4 mg, Per Tube, Q6H PRN **OR** ondansetron  (ZOFRAN ) injection 4 mg, 4 mg, Intravenous, Q6H PRN, Geronimo Amel, MD   Oral care mouth rinse, 15 mL, Mouth Rinse, Q2H, Paliwal, Aditya, MD, 15 mL at 08/27/23 0946   Oral care mouth rinse, 15 mL, Mouth Rinse, PRN, Paliwal, Aditya, MD   pantoprazole  (PROTONIX ) injection 40 mg, 40 mg, Intravenous, Daily, Desai, Rahul P, PA-C, 40 mg at 08/27/23 0941   polyethylene glycol  (MIRALAX  / GLYCOLAX ) packet 17 g, 17 g, Per Tube, BID, Gomes, Adriana, DO, 17 g at 08/27/23 0941   propofol  (DIPRIVAN ) 1000 MG/100ML infusion, 0-80 mcg/kg/min, Intravenous, Continuous, Desai, Rahul P, PA-C, Last Rate: 15.43 mL/hr at 08/27/23 0600, 20 mcg/kg/min at 08/27/23 0600   pyridostigmine  (MESTINON ) tablet 30 mg, 30 mg, Per Tube, 5 X Daily, Khamron Gellert, MD, 30 mg at 08/27/23 0941   senna-docusate (Senokot-S) tablet 1 tablet, 1 tablet, Per Tube, BID, Janna, Adriana, DO, 1 tablet at 08/27/23 0941   sodium chloride  flush (NS) 0.9 % injection 10-40 mL, 10-40 mL, Intracatheter, Q12H, Geronimo Amel, MD, 10 mL at 08/27/23 0942   sodium chloride  flush (NS) 0.9 % injection 10-40 mL, 10-40 mL, Intracatheter, PRN, Geronimo Amel, MD   sodium chloride  flush (NS) 0.9 % injection 3 mL, 3 mL, Intravenous, Q6H, Pawar, Rahul, MD, 3 mL at 08/27/23 0942   sodium chloride  HYPERTONIC 3 % nebulizer solution 4 mL, 4 mL, Nebulization, Q6H, Pawar, Rahul, MD, 4 mL at 08/27/23 0815   thiamine  (VITAMIN B1) tablet 100 mg, 100 mg, Per Tube, Daily, Pawar, Rahul, MD, 100 mg at 08/27/23 0941  Labs and Diagnostic Imaging   CBC:  Recent Labs  Lab 08/24/23 0359 08/25/23 0315 08/25/23 1349 08/26/23 0402 08/27/23 0355  WBC 9.4 10.9*  --  10.1 9.3  NEUTROABS 7.4 9.2*  --   --   --   HGB 10.6* 11.3*   < > 9.8* 10.0*  HCT 36.7 39.4   < > 32.9* 32.9*  MCV 102.2* 101.8*  --  100.9* 97.1  PLT 89* 109*  --  120* 136*   < > = values in this interval not displayed.    Basic Metabolic Panel:  Lab Results  Component Value Date   NA 146 (H) 08/27/2023   K 3.5 08/27/2023   CO2 43 (H) 08/27/2023   GLUCOSE 94 08/27/2023   BUN 8 08/27/2023   CREATININE 0.52 08/27/2023   CALCIUM  9.1 08/27/2023   GFRNONAA >60 08/27/2023   GFRAA >60 12/21/2018   Lipid Panel: No results found for: LDLCALC HgbA1c:  Lab Results  Component Value Date   HGBA1C 6.0 (H) 08/16/2016   Urine Drug Screen: No results found for:  LABOPIA, COCAINSCRNUR, LABBENZ, AMPHETMU, THCU, LABBARB  Alcohol Level No results found for: [facility] INR  Lab Results  Component Value Date   INR 1.2 08/19/2023   APTT No results found for: APTT AED levels: No results found for: PHENYTOIN, ZONISAMIDE, LAMOTRIGINE, LEVETIRACETA    Assessment   [name] is a 58 y.o. female with hx of  Musk negative myasthenia gravis status post thrombectomy, moderate persistent asthma, OSA on CPAP, chronic hypoxic and hypercarbic respiratory failure on 2L [place] PTA, history of PE not AC, HTN, hypothyroidism, depression, admitted for acute on chronic respiratory failure due sepsis due to bilateral lower lobe pneumonia on CAP coverage. Her course has been complicated by splenic laceration and subcapsular hematoma. Patient  had a MG exacerbation for which she was electively intubated and started on IVIG on 8/6.  She is still not ready for extubation. Can start her steroid sparing disease-modifying treatment today. Appreciate continue supportive treatment for respiratory failure per CCM.   Likely myasthenic crisis in the setting of acute illness  Recommendations  IVIG for a total of 5 days S BT per PCCM-okay to leave intubated today. Attempt again tomorrow. Resume CellCept  today. Will follow   -- Eligio Lav, MD Neurologist [facility] Pager: [phone]  CRITICAL CARE ATTESTATION Performed by: Eligio Lav, MD Total critical care time: 30 minutes Critical care time was exclusive of separately billable procedures and treating other patients and/or supervising APPs/Residents/Students Critical care was necessary to treat or prevent imminent or life-threatening deterioration. This patient is critically ill and at significant risk for neurological worsening and/or death and care requires constant monitoring. Critical care was time spent personally by me on the following activities: development of treatment plan  with patient and/or surrogate as well as nursing, discussions with consultants, evaluation of patient's response to treatment, examination of patient, obtaining history from patient or surrogate, ordering and performing treatments and interventions, ordering and review of laboratory studies, ordering and review of radiographic studies, pulse oximetry, re-evaluation of patient's condition, participation in multidisciplinary rounds and medical decision making of high complexity in the care of this patient.

## 2023-08-27 NOTE — Progress Notes (Signed)
 NAME:  [name], MRN:  [id number], DOB:  January 23, 1965, LOS: 9 ADMISSION DATE:  08/18/2023, CONSULTATION DATE:  08/25/23 REFERRING MD:  Sherrill CHIEF COMPLAINT:  AMS   History of Present Illness:  [name] is a 58 y.o. 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  She was admitted on 08/18/2023 for sepsis due to bilateral lower lobe pneumonia.  She was started on ceftriaxone  and doxycycline .  During admission, she was incidentally found to have splenic laceration and subcapsular hematoma for which she was seen by general surgery who opted for conservative management.  On 08/25/2023, she had change in her mental status along with complaints of increasing weakness, increased secretions, inability to swallow.  Her NIF was repeated and was -20 (-30 the day prior): Though, there was concern for suboptimal seal.  She had apparently not worn her CPAP overnight and she also had only received her last dose of pyridostigmine  overnight as she was unable to swallow appropriately on morning of 8/6.  She had an ABG which demonstrated worsening hypoxic and hypercapnic respiratory failure (7.3 2/103/74).  She was placed on BiPAP but due to concerns for worsening bulbar symptoms and impending respiratory failure, PCCM was asked to see in consultation for transfer to ICU.  Neurology has seen her already and is opting for IVIG when she transferred to the ICU.  Her oral medications including pyridostigmine  and mycophenolate  are being transitioned from oral to IV.  At the time of our evaluation, she is on BiPAP but has limited air entry.  She does awaken to voice and follows some simple commands but is slow to respond.  Pertinent  Medical History:  has Asthma exacerbation; GERD  (gastroesophageal reflux disease); Depression; Anxiety; Myasthenia gravis (HCC); Sleep apnea; Pulmonary emboli (HCC); Morbid obesity (HCC); Hypothyroidism; History of pulmonary embolus (PE); Sepsis due to pneumonia (HCC); Acute on chronic respiratory failure with hypoxia and hypercapnia (HCC); Elevated troponin; Thrombocytopenia (HCC); Moderate persistent asthma; Splenic laceration, initial encounter; and Acute respiratory failure with hypoxia and hypercapnia (HCC) on their problem list.   Significant Hospital Events: Including procedures, antibiotic start and stop dates in addition to other pertinent events   7/30 admit. 8/6 worsening bulbar symptoms, neuro and PCCM consulted, transferred to ICU, intubated. 8/8 planning for SBT pending CXR first  Interim History / Subjective:  NAEON. Awake, follows all commands. CXR pending, if clear will plan for SBT and probable extubation.  Objective:  Blood pressure (!) 112/48, pulse 85, temperature 98.3 F (36.8 C), temperature source Axillary, resp. rate (!) 26, height 5' (1.524 m), weight 127.5 kg, SpO2 94%.    Vent Mode: PSV;CPAP FiO2 (%):  [40 %] 40 % Set Rate:  [20 bmp] 20 bmp Vt Set:  [360 mL] 360 mL PEEP:  [5 cmH20] 5 cmH20 Pressure Support:  [8 cmH20] 8 cmH20 Plateau Pressure:  [18 cmH20-19 cmH20] 19 cmH20   Intake/Output Summary (Last 24 hours) at 08/27/2023 0849 Last data filed at 08/27/2023 0600 Gross per 24 hour  Intake 1689.64 ml  Output 2200 ml  Net -510.36 ml   Filed Weights   08/25/23 1211 08/26/23 0400 08/27/23 0500  Weight: 128.6 kg 130.1 kg 127.5 kg     Physical Exam: General: Adult female, resting in bed, in NAD. Neuro: On Propofol  but does arouse  to voice and is following all commands. HEENT: [place]/AT. Sclerae anicteric. ETT in place. Cardiovascular: RRR, no M/R/G.  Lungs: Respirations shallow and unlabored.  Diminished air entry bilaterally. Abdomen: Obese. BS x 4, soft, NT/ND.  Musculoskeletal: No gross deformities,  no edema.    Assessment & Plan:   Myasthenia gravis exacerbation with concern for impending crisis. - Neurology following, appreciate the assistance. - Continue Mestinon . - Holding PTA CellCept . - Continue IVIG per Neuro.  Acute on chronic hypoxic and hypercarbic respiratory failure - failed BiPAP and required intubation 8/6. Bilateral lower lobe pneumonia - COVID, flu, RSV all negative. Now with complete L sided opacification 8/7. Hx of OSA on CPAP. Hx asthma. - Repeat CXR now after pulmonary clearance measures added on 8/7.  - If CXR is improved, will plan for SBT this AM with probable extubation later. - If CXR not improved, will consider bronch for BAL and L sided airway clearance. - R lateral decub positioning as needed. - Depending on CXR, can likely stop empiric ceftriaxone , doxycycline  today as it has already been 9 days of therapy. - Follow cultures. - Bronchial hygiene. - Continue BD's, chest PT, hypertonic nebs. - Resume nocturnal CPAP once extubated. - Continue Singulair , albuterol .  Hypophosphatemia. - Kphos. - Follow BMP.  Hx HTN. - Hold antihypertensives for now.  Hx hypothyroidism. - Continue PTA Synthroid .  Hx depression. - Hold PTA Abilify  and Remeron .  Hx Obesity. - Hold PTA Tzhncb.  Anemia. - Transfuse for Hgb < 7.  Nontraumatic splenic laceration - evaluated by general surgery who initially recommended supportive care and has signed off on 8/5. - Continue supportive care and close monitoring.  Best practice (evaluated daily):  Diet/type: NPO DVT prophylaxis: SCD Pressure ulcer(s): pressure ulcer assessment deferred  GI prophylaxis: PPI Lines: N/A Foley:  N/A Code Status:  full code Last date of multidisciplinary goals of care discussion: None yet.  Critical care time: 30 min.   Sammi Gore, PA - [facility] For pager details, please see AMION or use Epic chat  After 1900, please call [facility] for cross  coverage needs 08/27/2023, 8:49 AM

## 2023-08-27 NOTE — Progress Notes (Signed)
 Attempted NIF x2. -10 was best with good effort.

## 2023-08-27 NOTE — Procedures (Signed)
 Cortrak  Person Inserting Tube:  Mady Dolly, RD Tube Type:  Cortrak - 43 inches Tube Size:  10 Tube Location:  Left nare Initial Placement:  Stomach Secured by: Bridle Technique Used to Measure Tube Placement:  Marking at nare/corner of mouth Cortrak Secured At:  66 cm   Cortrak Tube Team Note:  Consult received to place a Cortrak feeding tube.   No x-ray is required. RN may begin using tube.   If the tube becomes dislodged please keep the tube and contact the Cortrak team at [email or web address] for replacement.  If after hours and replacement cannot be delayed, place a NG tube and confirm placement with an abdominal x-ray.    Dolly Mady MS, RD, LDN Registered Dietitian Clinical Nutrition RD Inpatient Contact Info in Amion

## 2023-08-27 NOTE — Progress Notes (Signed)
 Physical Therapy Treatment Patient Details Name: [name] MRN: [id number] DOB: 01/23/1965 Today's Date: 08/27/2023   History of Present Illness [name] is a 58 y.o. female who presented with worsening SOB. Found to have PNA and CTA 7/30 w/ apparent splenic lacerations in the midpole region with surrounding subcapsular hematoma. No free fluid in the abdomen or pelvis. No active extravasation of contrast. Worsening respiratory status on 08/25/23, transferred to ICU and intubated. PMHx: obesity, depression, asthma, OSA, myasthenia gravis, chronic hypoxic hypercarbic respiratory failure on 2 L O2, remote history of PE (not on anticoagulation), HTN, hypothyroidism    PT Comments  Pt admitted with above diagnosis. Pt able to sit EOB 15 min with CGA today. Pt with better posture and did more exercises. More alert as well. VSS.  Will continue to follow acutely.  Pt currently with functional limitations due to the deficits listed below (see PT Problem List). Pt will benefit from acute skilled PT to increase their independence and safety with mobility to allow discharge.       If plan is discharge home, recommend the following: Two people to help with walking and/or transfers;Assistance with cooking/housework;Help with stairs or ramp for entrance;Assist for transportation   Can travel by private vehicle     No  Equipment Recommendations  Wheelchair cushion (measurements PT);Wheelchair (measurements PT);Hospital bed (may need hoyer lift pending progress; bariatric sized equipment, height adjustable to youth height (pt reports 18ft 0 height))    Recommendations for Other Services       Precautions / Restrictions Precautions Precautions: Fall Recall of Precautions/Restrictions: Impaired Precaution/Restrictions Comments: intubated Restrictions Weight Bearing Restrictions Per Provider Order: No     Mobility  Bed Mobility Overal bed mobility: Needs Assistance Bed Mobility: Supine  to Sit, Sit to Supine Rolling: Mod assist   Supine to sit: +2 for physical assistance, Mod assist Sit to supine: +2 for physical assistance, Mod assist   General bed mobility comments: pt assisted some for raising trunk, HOB up, assist for hips to EOB    Transfers                   General transfer comment: deferred    Ambulation/Gait               General Gait Details: unable at this time   Stairs             Wheelchair Mobility     Tilt Bed    Modified Rankin (Stroke Patients Only)       Balance Overall balance assessment: Needs assistance Sitting-balance support: Feet supported Sitting balance-Leahy Scale: Poor Sitting balance - Comments: CGA statically for up to 15 minutes EOB                                    Communication Communication Communication: Impaired Factors Affecting Communication: Trach/intubated;Other (comment) (nodding head)  Cognition Arousal: Alert Behavior During Therapy: Flat affect   PT - Cognitive impairments: Sequencing, Problem solving, Attention                         Following commands: Impaired Following commands impaired: Follows one step commands with increased time    Cueing Cueing Techniques: Verbal cues, Gestural cues, Tactile cues  Exercises General Exercises - Lower Extremity Ankle Circles/Pumps: AROM, Both, 10 reps, Seated Long Arc Quad: AROM, Both, 15 reps, Seated Heel Slides:  AROM, Both, 10 reps, Seated    General Comments General comments (skin integrity, edema, etc.): 50% FiO2, PEEP 5 ventilator      Pertinent Vitals/Pain Pain Assessment Pain Assessment: Faces Faces Pain Scale: Hurts little more Pain Location: feet Pain Descriptors / Indicators: Discomfort Pain Intervention(s): Limited activity within patient's tolerance, Monitored during session, Repositioned    Home Living                          Prior Function            PT Goals (current  goals can now be found in the care plan section) Acute Rehab PT Goals Patient Stated Goal: to improve mobility Progress towards PT goals: Progressing toward goals    Frequency    Min 2X/week      PT Plan      Co-evaluation              AM-PAC PT 6 Clicks Mobility   Outcome Measure  Help needed turning from your back to your side while in a flat bed without using bedrails?: A Lot Help needed moving from lying on your back to sitting on the side of a flat bed without using bedrails?: A Lot Help needed moving to and from a bed to a chair (including a wheelchair)?: Total Help needed standing up from a chair using your arms (e.g., wheelchair or bedside chair)?: Total (+2 assist) Help needed to walk in hospital room?: Total Help needed climbing 3-5 steps with a railing? : Total 6 Click Score: 8    End of Session   Activity Tolerance: Patient limited by fatigue Patient left: in bed;with call bell/phone within reach;with bed alarm set;Other (comment) (bed in chair posture with HOB >40 deg to promote tolerance for upright and pulmonary clearance.) Nurse Communication: Mobility status;Need for lift equipment PT Visit Diagnosis: Other abnormalities of gait and mobility (R26.89);Muscle weakness (generalized) (M62.81)     Time: 1202-1225 PT Time Calculation (min) (ACUTE ONLY): 23 min  Charges:    $Therapeutic Exercise: 8-22 mins $Therapeutic Activity: 8-22 mins PT General Charges $$ ACUTE PT VISIT: 1 Visit                     Melvin Marmo M,PT Acute Rehab Services [phone]    Stephane JULIANNA Bevel 08/27/2023, 3:14 PM

## 2023-08-28 DIAGNOSIS — E876 Hypokalemia: Secondary | ICD-10-CM | POA: Diagnosis not present

## 2023-08-28 DIAGNOSIS — J9622 Acute and chronic respiratory failure with hypercapnia: Secondary | ICD-10-CM | POA: Diagnosis not present

## 2023-08-28 DIAGNOSIS — G7001 Myasthenia gravis with (acute) exacerbation: Secondary | ICD-10-CM | POA: Diagnosis not present

## 2023-08-28 DIAGNOSIS — E039 Hypothyroidism, unspecified: Secondary | ICD-10-CM

## 2023-08-28 DIAGNOSIS — J9621 Acute and chronic respiratory failure with hypoxia: Secondary | ICD-10-CM | POA: Diagnosis not present

## 2023-08-28 DIAGNOSIS — J189 Pneumonia, unspecified organism: Secondary | ICD-10-CM | POA: Diagnosis not present

## 2023-08-28 DIAGNOSIS — I10 Essential (primary) hypertension: Secondary | ICD-10-CM

## 2023-08-28 LAB — BASIC METABOLIC PANEL WITH GFR
Anion gap: 10 (ref 5–15)
BUN: 10 mg/dL (ref 6–20)
CO2: 40 mmol/L — ABNORMAL HIGH (ref 22–32)
Calcium: 9.2 mg/dL (ref 8.9–10.3)
Chloride: 94 mmol/L — ABNORMAL LOW (ref 98–111)
Creatinine, Ser: 0.59 mg/dL (ref 0.44–1.00)
GFR, Estimated: >60 mL/min (ref >60)
Glucose, Bld: 104 mg/dL — ABNORMAL HIGH (ref 70–99)
Potassium: 3.4 mmol/L — ABNORMAL LOW (ref 3.5–5.1)
Sodium: 144 mmol/L (ref 135–145)

## 2023-08-28 LAB — GLUCOSE, CAPILLARY
Glucose-Capillary: 100 mg/dL — ABNORMAL HIGH (ref 70–99)
Glucose-Capillary: 103 mg/dL — ABNORMAL HIGH (ref 70–99)
Glucose-Capillary: 103 mg/dL — ABNORMAL HIGH (ref 70–99)
Glucose-Capillary: 108 mg/dL — ABNORMAL HIGH (ref 70–99)
Glucose-Capillary: 119 mg/dL — ABNORMAL HIGH (ref 70–99)
Glucose-Capillary: 99 mg/dL (ref 70–99)

## 2023-08-28 LAB — CBC
HCT: 32.1 % — ABNORMAL LOW (ref 36.0–46.0)
Hemoglobin: 9.9 g/dL — ABNORMAL LOW (ref 12.0–15.0)
MCH: 29.5 pg (ref 26.0–34.0)
MCHC: 30.8 g/dL (ref 30.0–36.0)
MCV: 95.5 fL (ref 80.0–100.0)
Platelets: 153 K/uL (ref 150–400)
RBC: 3.36 MIL/uL — ABNORMAL LOW (ref 3.87–5.11)
RDW: 14.6 % (ref 11.5–15.5)
WBC: 8.7 K/uL (ref 4.0–10.5)
nRBC: 0 % (ref 0.0–0.2)

## 2023-08-28 LAB — MAGNESIUM: Magnesium: 1.7 mg/dL (ref 1.7–2.4)

## 2023-08-28 LAB — PHOSPHORUS: Phosphorus: 2.7 mg/dL (ref 2.5–4.6)

## 2023-08-28 MED ORDER — ARIPIPRAZOLE 2 MG PO TABS
2.0000 mg | ORAL_TABLET | Freq: Every day | ORAL | Status: DC
  Filled 2023-08-28: qty 1

## 2023-08-28 MED ORDER — ARIPIPRAZOLE 2 MG PO TABS
2.0000 mg | ORAL_TABLET | Freq: Every day | ORAL | Status: DC
  Administered 2023-08-28 – 2023-08-30 (×4): 2 mg
  Filled 2023-08-28 (×3): qty 1

## 2023-08-28 MED ORDER — POTASSIUM CHLORIDE 20 MEQ PO PACK
60.0000 meq | PACK | Freq: Once | ORAL | Status: AC
  Administered 2023-08-28: 60 meq
  Filled 2023-08-28: qty 3

## 2023-08-28 MED ORDER — SODIUM CHLORIDE 3 % IN NEBU
INHALATION_SOLUTION | RESPIRATORY_TRACT | Status: AC
  Filled 2023-08-28: qty 15

## 2023-08-28 NOTE — Progress Notes (Signed)
 NEUROLOGY CONSULT FOLLOW UP NOTE   Date of service: August 28, 2023 Patient Name: [name] MRN:  [id number] DOB:  [birth year]  Interval Hx/subjective   Stable overnight, improved exam this morning with antigravity positioning of all extremities.   Vitals   Vitals:   08/28/23 0800 08/28/23 0813 08/28/23 0900 08/28/23 0912  BP:  (!) 104/50  (!) 103/51  Pulse: 71  70   Resp: (!) 22  13   Temp:      TempSrc:      SpO2: 94%  96%   Weight:      Height:        Body mass index is 55.15 kg/m.  Physical Exam   Constitutional: Appears well-developed and well-nourished.  Eyes: No scleral injection.  HENT: Remains intubated Head: Normocephalic.  Cardiovascular: Normal rate and regular rhythm.  Respiratory: Effort normal, non-labored breathing. On ventilator GI: Soft.  Skin: warm and dry  Neurologic Examination   Neurologic exam: Off sedation, awake alert Follows commands, nods appropriately to examiner questions Disconjugate gaze-that is baseline Pupils equal round reactive to light No ptosis with sustained upward gaze Today she was able to lift both upper extremities antigravity in both lower extremities antigravity for at least 5 to 10 seconds. Sensation intact  Medications  Current Facility-Administered Medications:    acetaminophen  (TYLENOL ) tablet 650 mg, 650 mg, Per Tube, Q6H PRN **OR** acetaminophen  (TYLENOL ) suppository 650 mg, 650 mg, Rectal, Q6H PRN, Geronimo, Murali, MD   albuterol  (PROVENTIL ) (2.5 MG/3ML) 0.083% nebulizer solution 2.5 mg, 2.5 mg, Nebulization, Q6H PRN, Tobie, Vishal R, MD   albuterol  (PROVENTIL ) (2.5 MG/3ML) 0.083% nebulizer solution 2.5 mg, 2.5 mg, Nebulization, Q6H, Pawar, Rahul, MD, 2.5 mg at 08/28/23 0751   budesonide  (PULMICORT ) nebulizer solution 0.5 mg, 0.5 mg, Nebulization, BID, Desai, Rahul P, PA-C, 0.5 mg at 08/28/23 0751   Chlorhexidine  Gluconate Cloth 2 % PADS 6 each, 6 each, Topical, Daily, Sherrill Cable Latif, DO, 6 each at  08/27/23 1014   docusate (COLACE) 50 MG/5ML liquid 100 mg, 100 mg, Per Tube, BID, Desai, Rahul P, PA-C, 100 mg at 08/27/23 2121   feeding supplement (PROSource TF20) liquid 60 mL, 60 mL, Per Tube, Daily, Pawar, Rahul, MD, 60 mL at 08/27/23 0941   feeding supplement (VITAL AF 1.2 CAL) liquid 1,000 mL, 1,000 mL, Per Tube, Continuous, Pawar, Rahul, MD, Last Rate: 35 mL/hr at 08/28/23 0700, Infusion Verify at 08/28/23 0700   fentaNYL  (SUBLIMAZE ) injection 50 mcg, 50 mcg, Intravenous, Q15 min PRN, Desai, Rahul P, PA-C   fentaNYL  (SUBLIMAZE ) injection 50-200 mcg, 50-200 mcg, Intravenous, Q30 min PRN, Desai, Rahul P, PA-C, 50 mcg at 08/25/23 1416   hydrALAZINE  (APRESOLINE ) injection 10 mg, 10 mg, Intravenous, Q6H PRN, Rashid, Farhan, MD   Immune Globulin  10% (PRIVIGEN ) IV infusion 30 g, 400 mg/kg (Adjusted), Intravenous, Q24 Hr x 5, Voncile Isles, MD, 30 g at 08/27/23 1444   levothyroxine  (SYNTHROID ) tablet 200 mcg, 200 mcg, Per Tube, QAC breakfast, Meade, Rahul P, PA-C, 200 mcg at 08/28/23 0510   midazolam  (VERSED ) injection 1-2 mg, 1-2 mg, Intravenous, Q1H PRN, Desai, Rahul P, PA-C   montelukast  (SINGULAIR ) tablet 10 mg, 10 mg, Per Tube, QHS, Desai, Rahul P, PA-C, 10 mg at 08/27/23 2121   mupirocin  cream (BACTROBAN ) 2 %, , Topical, BID, Rashid, Farhan, MD, Given at 08/27/23 2121   mycophenolate  (CELLCEPT ) oral suspension 200 mg/mL, 1,000 mg, Per Tube, BID, Sesilia Poucher, MD, 1,000 mg at 08/27/23 2121   ondansetron  (ZOFRAN ) tablet 4 mg,  4 mg, Per Tube, Q6H PRN **OR** ondansetron  (ZOFRAN ) injection 4 mg, 4 mg, Intravenous, Q6H PRN, Geronimo Amel, MD   Oral care mouth rinse, 15 mL, Mouth Rinse, Q2H, Paliwal, Aditya, MD, 15 mL at 08/28/23 0800   Oral care mouth rinse, 15 mL, Mouth Rinse, PRN, Paliwal, Aditya, MD   pantoprazole  (PROTONIX ) injection 40 mg, 40 mg, Intravenous, Daily, Desai, Rahul P, PA-C, 40 mg at 08/27/23 0941   polyethylene glycol (MIRALAX  / GLYCOLAX ) packet 17 g, 17 g, Per Tube, BID,  Janna, Adriana, DO, 17 g at 08/27/23 2121   propofol  (DIPRIVAN ) 1000 MG/100ML infusion, 0-80 mcg/kg/min, Intravenous, Continuous, Desai, Rahul P, PA-C, Stopped at 08/27/23 1203   pyridostigmine  (MESTINON ) tablet 30 mg, 30 mg, Per Tube, 5 X Daily, Chalonda Schlatter, MD, 30 mg at 08/28/23 0510   senna-docusate (Senokot-S) tablet 1 tablet, 1 tablet, Per Tube, BID, Janna Ferrier, DO, 1 tablet at 08/27/23 2121   sodium chloride  flush (NS) 0.9 % injection 10-40 mL, 10-40 mL, Intracatheter, Q12H, Geronimo Amel, MD, 10 mL at 08/27/23 0942   sodium chloride  flush (NS) 0.9 % injection 10-40 mL, 10-40 mL, Intracatheter, PRN, Geronimo Amel, MD   sodium chloride  flush (NS) 0.9 % injection 3 mL, 3 mL, Intravenous, Q6H, Pawar, Rahul, MD, 3 mL at 08/28/23 0355   thiamine  (VITAMIN B1) tablet 100 mg, 100 mg, Per Tube, Daily, Pawar, Rahul, MD, 100 mg at 08/27/23 0941  Labs and Diagnostic Imaging   CBC:  Recent Labs  Lab 08/24/23 0359 08/25/23 0315 08/25/23 1349 08/27/23 0355 08/28/23 0345  WBC 9.4 10.9*   < > 9.3 8.7  NEUTROABS 7.4 9.2*  --   --   --   HGB 10.6* 11.3*   < > 10.0* 9.9*  HCT 36.7 39.4   < > 32.9* 32.1*  MCV 102.2* 101.8*   < > 97.1 95.5  PLT 89* 109*   < > 136* 153   < > = values in this interval not displayed.   Basic Metabolic Panel:  Lab Results  Component Value Date   NA 144 08/28/2023   K 3.4 (L) 08/28/2023   CO2 40 (H) 08/28/2023   GLUCOSE 104 (H) 08/28/2023   BUN 10 08/28/2023   CREATININE 0.59 08/28/2023   CALCIUM  9.2 08/28/2023   GFRNONAA >60 08/28/2023   GFRAA >60 12/21/2018   HgbA1c:  Lab Results  Component Value Date   HGBA1C 6.0 (H) 08/16/2016    INR  Lab Results  Component Value Date   INR 1.2 08/19/2023   Assessment   [name] is a 58 y.o. female with hx of  Musk negative myasthenia gravis status post thrombectomy, moderate persistent asthma, OSA on CPAP, chronic hypoxic and hypercarbic respiratory failure on 2L [place] PTA, history of PE not  AC, HTN, hypothyroidism, depression, admitted for acute on chronic respiratory failure due sepsis due to bilateral lower lobe pneumonia on CAP coverage. Her course has been complicated by splenic laceration and subcapsular hematoma. Patient had a MG exacerbation for which she was electively intubated and started on IVIG on 8/6.   Started steroid sparing disease-modifying treatment 08/27/23 Appreciate continue supportive treatment for respiratory failure per CCM, discussed possibility of SBP and extubation today with ongoing improvement in strength since presentation   Likely myasthenic crisis in the setting of acute illness  Recommendations  IVIG for a total of 5 days SBT per PCCM with possible extubation if SBT successful Continue CellCept   Will follow Discussed with CCM APP P. Jenna  Stevi Toberman, AGACNP-BC [facility] Pager: [phone]  Attending Neurohospitalist Addendum Patient seen and examined with APP/Resident. Agree with the history and physical as documented above. Agree with the plan as documented, which I helped formulate. I have independently reviewed the chart, obtained history, review of systems and examined the patient.I have personally reviewed pertinent head/neck/spine imaging (CT/MRI). Please feel free to call with any questions.  -- Eligio Lav, MD Neurologist [facility] Pager: [phone]

## 2023-08-28 NOTE — Progress Notes (Signed)
 NAME:  [name], MRN:  [id number], DOB:  1965/07/19, LOS: 10 ADMISSION DATE:  08/18/2023, CONSULTATION DATE:  08/25/23 REFERRING MD:  Sherrill CHIEF COMPLAINT:  AMS   History of Present Illness:  [name] is a 58 y.o. 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  She was admitted on 08/18/2023 for sepsis due to bilateral lower lobe pneumonia.  She was started on ceftriaxone  and doxycycline .  During admission, she was incidentally found to have splenic laceration and subcapsular hematoma for which she was seen by general surgery who opted for conservative management.  On 08/25/2023, she had change in her mental status along with complaints of increasing weakness, increased secretions, inability to swallow.  Her NIF was repeated and was -20 (-30 the day prior): Though, there was concern for suboptimal seal.  She had apparently not worn her CPAP overnight and she also had only received her last dose of pyridostigmine  overnight as she was unable to swallow appropriately on morning of 8/6.  She had an ABG which demonstrated worsening hypoxic and hypercapnic respiratory failure (7.3 2/103/74).  She was placed on BiPAP but due to concerns for worsening bulbar symptoms and impending respiratory failure, PCCM was asked to see in consultation for transfer to ICU.  Neurology has seen her already and is opting for IVIG when she transferred to the ICU.  Her oral medications including pyridostigmine  and mycophenolate  are being transitioned from oral to IV.  At the time of our evaluation, she is on BiPAP but has limited air entry.  She does awaken to voice and follows some simple commands but is slow to respond.  Pertinent  Medical History:  has Asthma exacerbation; GERD  (gastroesophageal reflux disease); Depression; Anxiety; Myasthenia gravis (HCC); Sleep apnea; Pulmonary emboli (HCC); Morbid obesity (HCC); Hypothyroidism; History of pulmonary embolus (PE); Sepsis due to pneumonia (HCC); Acute on chronic respiratory failure with hypoxia and hypercapnia (HCC); Elevated troponin; Thrombocytopenia (HCC); Moderate persistent asthma; Splenic laceration, initial encounter; and Acute respiratory failure with hypoxia and hypercapnia (HCC) on their problem list.   Significant Hospital Events: Including procedures, antibiotic start and stop dates in addition to other pertinent events   7/30 admit. 8/6 worsening bulbar symptoms, neuro and PCCM consulted, transferred to ICU, intubated. 8/8 planning for SBT pending CXR first  Interim History / Subjective:   - Remains on CellCept  - IVIG initiated 8/8 planning for 5-day regimen - Hypokalemia this morning - I/O  - 5.1 L total - Chest x-ray 8/8, low lung volumes but significantly improved postintubation.  Interstitial prominence bilaterally    Objective:  Blood pressure (!) 103/51, pulse 70, temperature 98 F (36.7 C), temperature source Oral, resp. rate 13, height 5' (1.524 m), weight 128.1 kg, SpO2 96%.    Vent Mode: PRVC FiO2 (%):  [40 %-70 %] 70 % Set Rate:  [20 bmp] 20 bmp Vt Set:  [360 mL-370 mL] 370 mL PEEP:  [5 cmH20-8 cmH20] 8 cmH20 Plateau Pressure:  [16 cmH20-21 cmH20] 21 cmH20   Intake/Output Summary (Last 24 hours) at 08/28/2023 1030 Last data filed at 08/28/2023 0900 Gross per 24 hour  Intake 1196.47 ml  Output 2900 ml  Net -1703.53 ml   Filed Weights   08/26/23 0400 08/27/23 0500 08/28/23 0500  Weight: 130.1 kg 127.5 kg 128.1 kg  Physical Exam: General: Obese woman mechanically ventilated Neuro: Awake and alert, nods to questions and follows commands she is able to take a deep inspiration on PSV, had a strong cough on command HEENT: ET tube in good position Cardiovascular: Regular,  distant, no murmur Lungs: Decreased inferiorly but otherwise clear Abdomen: Obese and nondistended with positive bowel sounds Musculoskeletal: No edema   Assessment & Plan:   Myasthenia gravis exacerbation with respiratory muscle weakness -IVIG added 8/8, plan for 5 days - Continue CellCept  - Continue Mestinon  - Follow neurological exam, NIF, FVC if she can perform  Acute on chronic hypoxic and hypercarbic respiratory failure - failed BiPAP and required intubation 8/6. Possible bilateral lower lobe pneumonia versus atelectasis/volume loss, complete left atelectasis Hx of OSA on CPAP. Hx asthma. - PRVC 8 cc/kg, PEEP 8 given body habitus and respiratory muscle weakness - Will push for PSV but she may not have the respiratory muscle strength yet to consider extubation - Sedation as per PAD protocol - VAP prevention orders - Push pulmonary hygiene - Follow chest x-ray, atelectasis was improved postintubation - Ceftriaxone  and doxycycline  course completed - Continue BD, chest PT as ordered - Will need nocturnal CPAP versus BiPAP once extubated - Continue Singulair , albuterol    Hypophosphatemia Hypokalemia -Replete electrolytes as indicated - Follow BMP  Hx HTN. - Antihypertensive regimen on hold - Reinstitute when hemodynamically stable to do so  Hypothyroidism -Continue levothyroxine   Depression -Restart home regimen per tube Abilify  -Holding Remeron , Lyrica  until able to take p.o.  Hx Obesity. -Hold Wegovy  Anemia. - Follow CBC - Hemoglobin goal 7.0  Nontraumatic splenic laceration with hematoma- evaluated by general surgery who initially recommended supportive care and has signed off on 8/5. - Supportive care - Consider reimage if any evidence of progressive anemia, pain, etc.  Best practice (evaluated daily):  Diet/type: NPO DVT prophylaxis: SCD Pressure ulcer(s): pressure ulcer assessment deferred  GI prophylaxis: PPI Lines: N/A Foley:  N/A Code Status:   full code Last date of multidisciplinary goals of care discussion: None yet. Family: Discussed plans with the patient and family at bedside  Critical care time: 53 min   Lamar Chris, MD, PhD 08/28/2023, 10:30 AM [facility] [phone] or if no answer before 7:00PM call [phone] For any issues after 7:00PM please call eLink [phone]

## 2023-08-28 NOTE — Progress Notes (Addendum)
 eLink Physician-Brief Progress Note Patient Name: KONI KANNAN DOB: 01-15-1966 MRN: [id number]   Date of Service  08/28/2023  HPI/Events of Note  Slightly more hypoxic tonight.  Escalated FiO2 from 50 to 70%.  Escalated PEEP from 5->8.  Some rales on examination  eICU Interventions  Likely component of atelectasis given patient's body habitus.  Net negative fluid balance.  Continue current care.   9447 - Kcl  Intervention Category Intermediate Interventions: Respiratory distress - evaluation and management  Saulo Anthis 08/28/2023, 1:45 AM

## 2023-08-29 DIAGNOSIS — J9621 Acute and chronic respiratory failure with hypoxia: Secondary | ICD-10-CM | POA: Diagnosis not present

## 2023-08-29 DIAGNOSIS — G7001 Myasthenia gravis with (acute) exacerbation: Secondary | ICD-10-CM | POA: Diagnosis not present

## 2023-08-29 DIAGNOSIS — J9622 Acute and chronic respiratory failure with hypercapnia: Secondary | ICD-10-CM | POA: Diagnosis not present

## 2023-08-29 DIAGNOSIS — J189 Pneumonia, unspecified organism: Secondary | ICD-10-CM | POA: Diagnosis not present

## 2023-08-29 DIAGNOSIS — E876 Hypokalemia: Secondary | ICD-10-CM | POA: Diagnosis not present

## 2023-08-29 LAB — GLUCOSE, CAPILLARY
Glucose-Capillary: 107 mg/dL — ABNORMAL HIGH (ref 70–99)
Glucose-Capillary: 108 mg/dL — ABNORMAL HIGH (ref 70–99)
Glucose-Capillary: 102 mg/dL — ABNORMAL HIGH (ref 70–99)
Glucose-Capillary: 105 mg/dL — ABNORMAL HIGH (ref 70–99)
Glucose-Capillary: 98 mg/dL (ref 70–99)
Glucose-Capillary: 92 mg/dL (ref 70–99)

## 2023-08-29 LAB — BASIC METABOLIC PANEL WITH GFR
Anion gap: 10 (ref 5–15)
BUN: 15 mg/dL (ref 6–20)
CO2: 38 mmol/L — ABNORMAL HIGH (ref 22–32)
Calcium: 9.1 mg/dL (ref 8.9–10.3)
Chloride: 98 mmol/L (ref 98–111)
Creatinine, Ser: 0.6 mg/dL (ref 0.44–1.00)
GFR, Estimated: >60 mL/min (ref >60)
Glucose, Bld: 106 mg/dL — ABNORMAL HIGH (ref 70–99)
Potassium: 3.2 mmol/L — ABNORMAL LOW (ref 3.5–5.1)
Sodium: 146 mmol/L — ABNORMAL HIGH (ref 135–145)

## 2023-08-29 LAB — CBC
HCT: 31.5 % — ABNORMAL LOW (ref 36.0–46.0)
Hemoglobin: 9.9 g/dL — ABNORMAL LOW (ref 12.0–15.0)
MCH: 29.2 pg (ref 26.0–34.0)
MCHC: 31.4 g/dL (ref 30.0–36.0)
MCV: 92.9 fL (ref 80.0–100.0)
Platelets: 179 K/uL (ref 150–400)
RBC: 3.39 MIL/uL — ABNORMAL LOW (ref 3.87–5.11)
RDW: 14.9 % (ref 11.5–15.5)
WBC: 7.2 K/uL (ref 4.0–10.5)
nRBC: 0 % (ref 0.0–0.2)

## 2023-08-29 LAB — TRIGLYCERIDES: Triglycerides: 121 mg/dL (ref <150)

## 2023-08-29 MED ORDER — POLYETHYLENE GLYCOL 3350 17 G PO PACK: 17.0000 g | PACK | Freq: Every day | ORAL | Status: DC

## 2023-08-29 MED ORDER — POTASSIUM CHLORIDE 20 MEQ PO PACK
20.0000 meq | PACK | ORAL | Status: AC
  Administered 2023-08-29 (×2): 20 meq
  Filled 2023-08-29 (×2): qty 1

## 2023-08-29 MED ORDER — FREE WATER
200.0000 mL | Freq: Four times a day (QID) | Status: DC
  Administered 2023-08-29 – 2023-09-02 (×30): 200 mL

## 2023-08-29 MED ORDER — ORAL CARE MOUTH RINSE: 15.0000 mL | OROMUCOSAL | Status: DC | PRN

## 2023-08-29 MED ORDER — POTASSIUM CHLORIDE 10 MEQ/50ML IV SOLN
10.0000 meq | INTRAVENOUS | Status: AC
  Administered 2023-08-29 (×4): 10 meq via INTRAVENOUS
  Filled 2023-08-29 (×4): qty 50

## 2023-08-29 MED ORDER — CARMEX CLASSIC LIP BALM EX OINT
TOPICAL_OINTMENT | CUTANEOUS | Status: DC | PRN
  Filled 2023-08-29: qty 10

## 2023-08-29 MED ORDER — ORAL CARE MOUTH RINSE
15.0000 mL | OROMUCOSAL | Status: DC
  Administered 2023-08-29 – 2023-09-07 (×41): 15 mL via OROMUCOSAL

## 2023-08-29 NOTE — Progress Notes (Signed)
 Required BIPAP earlier. Now no distress Plan Cont BIPAP PRN and at HS

## 2023-08-29 NOTE — Progress Notes (Signed)
 Pt placed on bipap after having distress.  Pt desat to 70s on [place] I and was labored.  Placed on 23/8, f-18, 100% at this time.  CPT held d/t agitation.  Will attempt to do CPT at 2pm rounds.

## 2023-08-29 NOTE — Progress Notes (Signed)
 [facility] ADULT ICU REPLACEMENT PROTOCOL   The patient does apply for the [facility] Adult ICU Electrolyte Replacment Protocol based on the criteria listed below:   1.Exclusion criteria: TCTS, ECMO, Dialysis, and Myasthenia Gravis patients 2. Is GFR >/= 30 ml/min? Yes.    Patient's GFR today is >60 3. Is SCr </= 2? Yes.   Patient's SCr is 0.6 mg/dL 4. Did SCr increase >/= 0.5 in 24 hours? No. 5.Pt's weight >40kg  Yes.   6. Abnormal electrolyte(s): K+3.2  7. Electrolytes replaced per protocol 8.  Call MD STAT for K+ </= 2.5, Phos </= 1, or Mag </= 1 Physician:  Dr JINNY. Claudene Darner, Norleen Barters 08/29/2023 7:01 AM

## 2023-08-29 NOTE — Progress Notes (Signed)
 NEUROLOGY CONSULT FOLLOW UP NOTE   Date of service: August 29, 2023 Patient Name: [name] MRN:  [id number] DOB:  1965-03-10  Interval Hx/subjective   Stable overnight, improved exam this morning with antigravity positioning of all extremities. On pressure support 13/8; at 50% FiO2.   Vitals   Vitals:   08/29/23 0449 08/29/23 0500 08/29/23 0600 08/29/23 0731  BP:  (!) 94/48    Pulse: 72 67 68   Resp: (!) 21 20 (!) 28   Temp:    98.2 F (36.8 C)  TempSrc:    Oral  SpO2: 94% 95% 94%   Weight: 124.6 kg     Height:        Body mass index is 53.65 kg/m.  Physical Exam   Constitutional: Appears well-developed and well-nourished.  Eyes: No scleral injection.  HENT: Remains intubated Head: Normocephalic.  Cardiovascular: Normal rate and regular rhythm.  Respiratory: Effort normal, non-labored breathing. On ventilator GI: Soft.  Skin: warm and dry  Neurologic Examination   Neurologic exam: Off sedation, awake alert Follows commands, nods appropriately to examiner questions Disconjugate gaze-that is baseline Pupils equal round reactive to light No ptosis with sustained upward gaze Strong cough Today she was able to lift both upper extremities antigravity in both lower extremities antigravity for at least 5 to 10 seconds. Sensation intact  Overall less fatigable weakness today  Medications  Current Facility-Administered Medications:    acetaminophen  (TYLENOL ) tablet 650 mg, 650 mg, Per Tube, Q6H PRN **OR** acetaminophen  (TYLENOL ) suppository 650 mg, 650 mg, Rectal, Q6H PRN, Geronimo Amel, MD   albuterol  (PROVENTIL ) (2.5 MG/3ML) 0.083% nebulizer solution 2.5 mg, 2.5 mg, Nebulization, Q6H PRN, Tobie, Jorie SAUNDERS, MD   albuterol  (PROVENTIL ) (2.5 MG/3ML) 0.083% nebulizer solution 2.5 mg, 2.5 mg, Nebulization, Q6H, Pawar, Rahul, MD, 2.5 mg at 08/29/23 0813   ARIPiprazole  (ABILIFY ) tablet 2 mg, 2 mg, Per Tube, Daily, Millen, Jessica B, RPH, 2 mg at 08/28/23 1152    budesonide  (PULMICORT ) nebulizer solution 0.5 mg, 0.5 mg, Nebulization, BID, Desai, Rahul P, PA-C, 0.5 mg at 08/29/23 0809   Chlorhexidine  Gluconate Cloth 2 % PADS 6 each, 6 each, Topical, Daily, Sheikh, Omair Powers, DO, 6 each at 08/28/23 0955   docusate (COLACE) 50 MG/5ML liquid 100 mg, 100 mg, Per Tube, BID, Desai, Rahul P, PA-C, 100 mg at 08/27/23 2121   feeding supplement (PROSource TF20) liquid 60 mL, 60 mL, Per Tube, Daily, Pawar, Rahul, MD, 60 mL at 08/28/23 1005   feeding supplement (VITAL AF 1.2 CAL) liquid 1,000 mL, 1,000 mL, Per Tube, Continuous, Pawar, Rahul, MD, Last Rate: 45 mL/hr at 08/29/23 0600, Infusion Verify at 08/29/23 0600   fentaNYL  (SUBLIMAZE ) injection 50 mcg, 50 mcg, Intravenous, Q15 min PRN, Desai, Rahul P, PA-C   fentaNYL  (SUBLIMAZE ) injection 50-200 mcg, 50-200 mcg, Intravenous, Q30 min PRN, Desai, Rahul P, PA-C, 50 mcg at 08/28/23 2204   hydrALAZINE  (APRESOLINE ) injection 10 mg, 10 mg, Intravenous, Q6H PRN, Rashid, Farhan, MD   Immune Globulin  10% (PRIVIGEN ) IV infusion 30 g, 400 mg/kg (Adjusted), Intravenous, Q24 Hr x 5, Voncile Isles, MD, Stopped at 08/28/23 1646   levothyroxine  (SYNTHROID ) tablet 200 mcg, 200 mcg, Per Tube, QAC breakfast, Meade, Rahul P, PA-C, 200 mcg at 08/29/23 0603   midazolam  (VERSED ) injection 1-2 mg, 1-2 mg, Intravenous, Q1H PRN, Desai, Rahul P, PA-C   montelukast  (SINGULAIR ) tablet 10 mg, 10 mg, Per Tube, QHS, Desai, Rahul P, PA-C, 10 mg at 08/28/23 2115   mupirocin  cream (BACTROBAN ) 2 %, ,  Topical, BID, Dino Antu, MD, 1 Application at 08/29/23 0800   mycophenolate  (CELLCEPT ) oral suspension 200 mg/mL, 1,000 mg, Per Tube, BID, Terilyn Sano, MD, 1,000 mg at 08/28/23 2117   ondansetron  (ZOFRAN ) tablet 4 mg, 4 mg, Per Tube, Q6H PRN **OR** ondansetron  (ZOFRAN ) injection 4 mg, 4 mg, Intravenous, Q6H PRN, Geronimo Amel, MD   Oral care mouth rinse, 15 mL, Mouth Rinse, Q2H, Paliwal, Aditya, MD, 15 mL at 08/29/23 0800   Oral care mouth rinse,  15 mL, Mouth Rinse, PRN, Paliwal, Aditya, MD   pantoprazole  (PROTONIX ) injection 40 mg, 40 mg, Intravenous, Daily, Desai, Rahul P, PA-C, 40 mg at 08/28/23 1016   polyethylene glycol (MIRALAX  / GLYCOLAX ) packet 17 g, 17 g, Per Tube, BID, Janna, Adriana, DO, 17 g at 08/27/23 2121   potassium chloride  (KLOR-CON ) packet 20 mEq, 20 mEq, Per Tube, Q4H, Claudene Agent, MD, 20 mEq at 08/29/23 0816   potassium chloride  10 mEq in 50 mL *CENTRAL LINE* IVPB, 10 mEq, Intravenous, Q1 Hr x 4, Claudene Agent, MD, Last Rate: 50 mL/hr at 08/29/23 0816, 10 mEq at 08/29/23 0816   propofol  (DIPRIVAN ) 1000 MG/100ML infusion, 0-80 mcg/kg/min, Intravenous, Continuous, Desai, Rahul P, PA-C, Stopped at 08/27/23 1203   pyridostigmine  (MESTINON ) tablet 30 mg, 30 mg, Per Tube, 5 X Daily, Chaquita Basques, MD, 30 mg at 08/29/23 0654   senna-docusate (Senokot-S) tablet 1 tablet, 1 tablet, Per Tube, BID, Janna Ferrier, DO, 1 tablet at 08/27/23 2121   sodium chloride  flush (NS) 0.9 % injection 10-40 mL, 10-40 mL, Intracatheter, Q12H, Geronimo Amel, MD, 10 mL at 08/28/23 2119   sodium chloride  flush (NS) 0.9 % injection 10-40 mL, 10-40 mL, Intracatheter, PRN, Geronimo Amel, MD   sodium chloride  flush (NS) 0.9 % injection 3 mL, 3 mL, Intravenous, Q6H, Pawar, Rahul, MD, 3 mL at 08/29/23 0416   thiamine  (VITAMIN B1) tablet 100 mg, 100 mg, Per Tube, Daily, Pawar, Rahul, MD, 100 mg at 08/28/23 1005  Labs and Diagnostic Imaging   CBC:  Recent Labs  Lab 08/24/23 0359 08/25/23 0315 08/25/23 1349 08/28/23 0345 08/29/23 0549  WBC 9.4 10.9*   < > 8.7 7.2  NEUTROABS 7.4 9.2*  --   --   --   HGB 10.6* 11.3*   < > 9.9* 9.9*  HCT 36.7 39.4   < > 32.1* 31.5*  MCV 102.2* 101.8*   < > 95.5 92.9  PLT 89* 109*   < > 153 179   < > = values in this interval not displayed.   Basic Metabolic Panel:  Lab Results  Component Value Date   NA 146 (H) 08/29/2023   K 3.2 (L) 08/29/2023   CO2 38 (H) 08/29/2023   GLUCOSE 106 (H) 08/29/2023    BUN 15 08/29/2023   CREATININE 0.60 08/29/2023   CALCIUM  9.1 08/29/2023   GFRNONAA >60 08/29/2023   GFRAA >60 12/21/2018   HgbA1c:  Lab Results  Component Value Date   HGBA1C 6.0 (H) 08/16/2016    INR  Lab Results  Component Value Date   INR 1.2 08/19/2023   Assessment   KRISLYNN GRONAU is a 58 y.o. female with hx of  Musk negative myasthenia gravis status post thrombectomy, moderate persistent asthma, OSA on CPAP, chronic hypoxic and hypercarbic respiratory failure on 2L [place] PTA, history of PE not AC, HTN, hypothyroidism, depression, admitted for acute on chronic respiratory failure due sepsis due to bilateral lower lobe pneumonia on CAP coverage. Her course has been complicated by splenic  laceration and subcapsular hematoma.  Clinically became overall weak-concern for MG exacerbation for which she was electively intubated and started on IVIG on 8/6.   Resumed home steroid sparing disease-modifying treatment 08/27/23 (CellCept ) Appreciate continue supportive treatment for respiratory failure per CCM, discussed possibility of SBP and extubation today with ongoing improvement in strength since presentation   Likely myasthenic crisis in the setting of acute illness  Recommendations  IVIG for a total of 5 days (8/6-8/11) SBT per PCCM with possible extubation Continue CellCept   Will follow Discussed with CCM APP MYRTIS Banner and Dr. Shelah  Patient seen and examined by NP/APP with MD. MD to update note as needed.   Jorene Last, DNP, FNP-BC [facility] Pager: [phone]  Attending Neurohospitalist Addendum Patient seen and examined with APP/Resident. Agree with the history and physical as documented above. Agree with the plan as documented, which I helped formulate. I have independently reviewed the chart, obtained history, review of systems and examined the patient.I have personally reviewed pertinent head/neck/spine imaging (CT/MRI). Please feel free to call with  any questions.  -- Eligio Lav, MD Neurologist [facility] Pager: [phone]

## 2023-08-29 NOTE — Plan of Care (Signed)
  Problem: Clinical Measurements: Goal: Ability to maintain clinical measurements within normal limits will improve Outcome: Progressing   Problem: Clinical Measurements: Goal: Will remain free from infection Outcome: Progressing   Problem: Clinical Measurements: Goal: Diagnostic test results will improve Outcome: Progressing   Problem: Clinical Measurements: Goal: Respiratory complications will improve Outcome: Progressing   Problem: Clinical Measurements: Goal: Cardiovascular complication will be avoided Outcome: Progressing   Problem: Pain Managment: Goal: General experience of comfort will improve and/or be controlled Outcome: Progressing   Problem: Safety: Goal: Ability to remain free from injury will improve Outcome: Progressing   Problem: Skin Integrity: Goal: Risk for impaired skin integrity will decrease Outcome: Progressing   Problem: Respiratory: Goal: Ability to maintain a clear airway and adequate ventilation will improve Outcome: Progressing   Plan of care, assessment, treatment, monitoring and intervention (s) ongoing, see MAR see flowsheet

## 2023-08-29 NOTE — Procedures (Signed)
 Extubation Procedure Note  Patient Details:   Name: [name] DOB: 1965/07/01 MRN: [id number]   Airway Documentation:    Vent end date: 08/29/23 Vent end time: 0954   Evaluation  O2 sats: stable throughout Complications: No apparent complications Patient did tolerate procedure well. Bilateral Breath Sounds: Clear, Diminished   Yes  Kobe Ofallon 08/29/2023, 9:57 AM Pt extubated to 3L [place] at this time.

## 2023-08-29 NOTE — Progress Notes (Signed)
 Patient developed respiratory distress during bath d/t laying flat and turning. Breathing became labored and desats down to 60 right before starting BiPAP. Sats recovered to 100 after several minutes on BiPAP and work of breathing decreased. Notified Jeralyn Banner NP that patient was placed on BiPAP. Patient resting comfortably now and compliant with BiPAP.

## 2023-08-29 NOTE — Progress Notes (Signed)
 NAME:  [name], MRN:  [id number], DOB:  20-Nov-1965, LOS: 11 ADMISSION DATE:  08/18/2023, CONSULTATION DATE:  08/25/23 REFERRING MD:  Sherrill CHIEF COMPLAINT:  AMS   History of Present Illness:  [name] is a 58 y.o. 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  She was admitted on 08/18/2023 for sepsis due to bilateral lower lobe pneumonia.  She was started on ceftriaxone  and doxycycline .  During admission, she was incidentally found to have splenic laceration and subcapsular hematoma for which she was seen by general surgery who opted for conservative management.  On 08/25/2023, she had change in her mental status along with complaints of increasing weakness, increased secretions, inability to swallow.  Her NIF was repeated and was -20 (-30 the day prior): Though, there was concern for suboptimal seal.  She had apparently not worn her CPAP overnight and she also had only received her last dose of pyridostigmine  overnight as she was unable to swallow appropriately on morning of 8/6.  She had an ABG which demonstrated worsening hypoxic and hypercapnic respiratory failure (7.3 2/103/74).  She was placed on BiPAP but due to concerns for worsening bulbar symptoms and impending respiratory failure, PCCM was asked to see in consultation for transfer to ICU.  Neurology has seen her already and is opting for IVIG when she transferred to the ICU.  Her oral medications including pyridostigmine  and mycophenolate  are being transitioned from oral to IV.  At the time of our evaluation, she is on BiPAP but has limited air entry.  She does awaken to voice and follows some simple commands but is slow to respond.  Pertinent  Medical History:  has Asthma exacerbation; GERD  (gastroesophageal reflux disease); Depression; Anxiety; Myasthenia gravis (HCC); Sleep apnea; Pulmonary emboli (HCC); Morbid obesity (HCC); Hypothyroidism; History of pulmonary embolus (PE); Sepsis due to pneumonia (HCC); Acute on chronic respiratory failure with hypoxia and hypercapnia (HCC); Elevated troponin; Thrombocytopenia (HCC); Moderate persistent asthma; Splenic laceration, initial encounter; and Acute respiratory failure with hypoxia (HCC) on their problem list.   Significant Hospital Events: Including procedures, antibiotic start and stop dates in addition to other pertinent events   7/30 admit. 8/6 worsening bulbar symptoms, neuro and PCCM consulted, transferred to ICU, intubated. 8/8 planning for SBT pending CXR first 8/10 passed SBT. Strength and endurance much improved   Interim History / Subjective:   Much improved. Passed SBT   Objective:  Blood pressure (!) 94/48, pulse 68, temperature 98.2 F (36.8 C), temperature source Oral, resp. rate (!) 28, height 5' (1.524 m), weight 124.6 kg, SpO2 94%.    Vent Mode: PSV;CPAP FiO2 (%):  [50 %] 50 % Set Rate:  [20 bmp] 20 bmp Vt Set:  [450 mL] 450 mL PEEP:  [8 cmH20] 8 cmH20 Pressure Support:  [13 cmH20] 13 cmH20 Plateau Pressure:  [21 cmH20-22 cmH20] 21 cmH20   Intake/Output Summary (Last 24 hours) at 08/29/2023 0900 Last data filed at 08/29/2023 0600 Gross per 24 hour  Intake 974.83 ml  Output 700 ml  Net 274.83 ml   Filed Weights   08/27/23 0500 08/28/23 0500 08/29/23 0449  Weight: 127.5 kg 128.1 kg 124.6 kg     Physical Exam: General This is a pleasant 58 year old female, currently on spontaneous breathing trial and in no acute distress  HEENT normocephalic atraumatic orally intubated no increased saliva or oral secretions has chronic disconjugate gaze, able to lift her head off the pillow and hold it for several seconds Pulmonary coarse scattered rhonchi, currently on pressure support ventilation pressure support  of 5 PEEP of 8 given body habitus, tidal volumes in the 350 range, F/VT acceptable, and well below 100.  She has no accessory use on this mode of ventilation I see no nasals flare diaphragmatic efforts Cardiac regular rate and rhythm Abdomen soft nontender Extremities are warm and dry with brisk capillary refill she does have chronic neuropathy, with some discomfort to palpation of the feet Neuro awake, follows commands strength improving.  Able to look up for several seconds without lead fatigue GU clear yellow   Assessment & Plan:   Myasthenia gravis exacerbation with respiratory muscle weakness -clinically improved.  Plan IVIG added 8/8, plan for 5 days Continue CellCept  Continue Mestinon  Follow neurological exam, NIF, FVC if she can perform  Acute on chronic hypoxic and hypercarbic respiratory failure - failed BiPAP and required intubation 8/6. Possible bilateral lower lobe pneumonia versus atelectasis/volume loss, complete left atelectasis Hx of OSA on CPAP. Hx asthma. -respiratory weakness for MG was the primary issue -passed SBT Plan Extubate Cont pulse ox Careful w/ sedating meds Post-extubation IS Will need swallow eval. Cont all meds and nut via tube for now BIPAP HS and PRN  Cont BDs and chest PT  Fluid and electrolyte imbalance: hypernatremia, hypokalemia   Plan Free water  replacement Replace K ordered   Hx HTN. plan Antihypertensive (cozaar ) regimen on hold Reinstitute when hemodynamically stable to do so  Hypothyroidism Plan Continue levothyroxine   Depression Plan Restarted home regimen per tube Abilify  Holding Remeron , Lyrica  until able to take p.o.  Hx Obesity. Plan Hold Wegovy Reassess as outpt   Anemia. No evidence of bleeding Plan Trend cbc Transfuse for hgb < 7   Nontraumatic splenic laceration with hematoma- evaluated by general surgery who initially recommended supportive care and has signed off on 8/5. plan Supportive  care Consider reimage if any evidence of progressive anemia, pain, etc.  Best practice (evaluated daily):  Diet/type: NPO DVT prophylaxis: SCD Pressure ulcer(s): pressure ulcer assessment deferred  GI prophylaxis: PPI Lines: N/A Foley:  N/A Code Status:  full code Last date of multidisciplinary goals of care discussion: None yet. Family: Discussed plans with the patient and family at bedside  Critical care time:  31 min   Maude FORBES Banner ACNP-BC [facility] Pulmonary/Critical Care Pager # [phone] OR # [phone] if no answer

## 2023-08-30 DIAGNOSIS — E870 Hyperosmolality and hypernatremia: Secondary | ICD-10-CM

## 2023-08-30 DIAGNOSIS — E876 Hypokalemia: Secondary | ICD-10-CM | POA: Diagnosis not present

## 2023-08-30 DIAGNOSIS — G7001 Myasthenia gravis with (acute) exacerbation: Secondary | ICD-10-CM | POA: Diagnosis not present

## 2023-08-30 DIAGNOSIS — G7000 Myasthenia gravis without (acute) exacerbation: Secondary | ICD-10-CM | POA: Diagnosis not present

## 2023-08-30 DIAGNOSIS — J9621 Acute and chronic respiratory failure with hypoxia: Secondary | ICD-10-CM | POA: Diagnosis not present

## 2023-08-30 DIAGNOSIS — E878 Other disorders of electrolyte and fluid balance, not elsewhere classified: Secondary | ICD-10-CM

## 2023-08-30 LAB — BASIC METABOLIC PANEL WITH GFR
Anion gap: 5 (ref 5–15)
BUN: 15 mg/dL (ref 6–20)
CO2: 38 mmol/L — ABNORMAL HIGH (ref 22–32)
Calcium: 9.1 mg/dL (ref 8.9–10.3)
Chloride: 101 mmol/L (ref 98–111)
Creatinine, Ser: 0.6 mg/dL (ref 0.44–1.00)
GFR, Estimated: >60 mL/min (ref >60)
Glucose, Bld: 98 mg/dL (ref 70–99)
Potassium: 4.1 mmol/L (ref 3.5–5.1)
Sodium: 144 mmol/L (ref 135–145)

## 2023-08-30 LAB — CBC
HCT: 33.1 % — ABNORMAL LOW (ref 36.0–46.0)
Hemoglobin: 10.2 g/dL — ABNORMAL LOW (ref 12.0–15.0)
MCH: 29.4 pg (ref 26.0–34.0)
MCHC: 30.8 g/dL (ref 30.0–36.0)
MCV: 95.4 fL (ref 80.0–100.0)
Platelets: 221 K/uL (ref 150–400)
RBC: 3.47 MIL/uL — ABNORMAL LOW (ref 3.87–5.11)
RDW: 15.2 % (ref 11.5–15.5)
WBC: 7.1 K/uL (ref 4.0–10.5)
nRBC: 0 % (ref 0.0–0.2)

## 2023-08-30 LAB — GLUCOSE, CAPILLARY
Glucose-Capillary: 93 mg/dL (ref 70–99)
Glucose-Capillary: 99 mg/dL (ref 70–99)
Glucose-Capillary: 101 mg/dL — ABNORMAL HIGH (ref 70–99)
Glucose-Capillary: 124 mg/dL — ABNORMAL HIGH (ref 70–99)
Glucose-Capillary: 103 mg/dL — ABNORMAL HIGH (ref 70–99)

## 2023-08-30 MED ORDER — OSMOLITE 1.5 CAL PO LIQD
660.0000 mL | ORAL | Status: DC
  Administered 2023-08-30 (×2): 660 mL
  Filled 2023-08-30 (×7): qty 711

## 2023-08-30 MED ORDER — ONDANSETRON HCL 4 MG PO TABS: 4.0000 mg | ORAL_TABLET | Freq: Four times a day (QID) | ORAL | Status: DC | PRN

## 2023-08-30 MED ORDER — PREGABALIN 100 MG PO CAPS
200.0000 mg | ORAL_CAPSULE | Freq: Two times a day (BID) | ORAL | Status: DC
  Administered 2023-08-30 – 2023-09-07 (×21): 200 mg via ORAL
  Filled 2023-08-30 (×16): qty 2

## 2023-08-30 MED ORDER — LEVOTHYROXINE SODIUM 100 MCG PO TABS
200.0000 ug | ORAL_TABLET | Freq: Every day | ORAL | Status: DC
  Administered 2023-08-31 – 2023-09-07 (×10): 200 ug via ORAL
  Filled 2023-08-30 (×8): qty 2

## 2023-08-30 MED ORDER — ACETAMINOPHEN 325 MG PO TABS
650.0000 mg | ORAL_TABLET | Freq: Four times a day (QID) | ORAL | Status: DC | PRN
  Administered 2023-08-30 – 2023-09-03 (×7): 650 mg via ORAL
  Filled 2023-08-30 (×5): qty 2

## 2023-08-30 MED ORDER — ONDANSETRON HCL 4 MG/2ML IJ SOLN: 4.0000 mg | Freq: Four times a day (QID) | INTRAMUSCULAR | Status: DC | PRN

## 2023-08-30 MED ORDER — THIAMINE MONONITRATE 100 MG PO TABS
100.0000 mg | ORAL_TABLET | Freq: Every day | ORAL | Status: AC
  Administered 2023-08-31 – 2023-09-01 (×4): 100 mg via ORAL
  Filled 2023-08-30 (×2): qty 1

## 2023-08-30 MED ORDER — POLYETHYLENE GLYCOL 3350 17 G PO PACK
17.0000 g | PACK | Freq: Every day | ORAL | Status: DC
  Administered 2023-08-31 – 2023-09-05 (×8): 17 g via ORAL
  Filled 2023-08-30 (×7): qty 1

## 2023-08-30 MED ORDER — ACETAMINOPHEN 650 MG RE SUPP: 650.0000 mg | Freq: Four times a day (QID) | RECTAL | Status: DC | PRN

## 2023-08-30 MED ORDER — PYRIDOSTIGMINE BROMIDE 60 MG PO TABS
30.0000 mg | ORAL_TABLET | Freq: Every day | ORAL | Status: DC
  Administered 2023-08-30 – 2023-09-07 (×52): 30 mg via ORAL
  Filled 2023-08-30 (×47): qty 0.5

## 2023-08-30 MED ORDER — MYCOPHENOLATE MOFETIL 250 MG PO CAPS
1000.0000 mg | ORAL_CAPSULE | Freq: Two times a day (BID) | ORAL | Status: DC
  Administered 2023-08-30 – 2023-09-07 (×21): 1000 mg via ORAL
  Filled 2023-08-30 (×18): qty 4

## 2023-08-30 MED ORDER — ARIPIPRAZOLE 2 MG PO TABS
2.0000 mg | ORAL_TABLET | Freq: Every day | ORAL | Status: DC
  Administered 2023-08-31 – 2023-09-07 (×10): 2 mg via ORAL
  Filled 2023-08-30 (×8): qty 1

## 2023-08-30 NOTE — Plan of Care (Signed)
  Problem: Clinical Measurements: Goal: Ability to maintain clinical measurements within normal limits will improve Outcome: Progressing   Problem: Clinical Measurements: Goal: Will remain free from infection Outcome: Progressing   Problem: Clinical Measurements: Goal: Diagnostic test results will improve Outcome: Progressing   Problem: Clinical Measurements: Goal: Respiratory complications will improve Outcome: Progressing   Problem: Clinical Measurements: Goal: Cardiovascular complication will be avoided Outcome: Progressing   Problem: Nutrition: Goal: Adequate nutrition will be maintained Outcome: Progressing   Problem: Coping: Goal: Level of anxiety will decrease Outcome: Progressing   Problem: Safety: Goal: Ability to remain free from injury will improve Outcome: Progressing   Problem: Respiratory: Goal: Ability to maintain a clear airway and adequate ventilation will improve Outcome: Progressing    Plan of care, assessment, treatment, monitoring, and intervention (s)   ongoing, see MAR see flowsheet

## 2023-08-30 NOTE — Progress Notes (Signed)
 [facility] [place] [facility]  Care Management Progress Note    Date: 08/30/2023  Outcome:  3rd outreach attempt, left voicemail.     Purpose of contact: CCM          Attempted to contact patient. Unable to reach patient, left a VM.    Additional Information/Plan:  Patient provided my direct contact information and encouraged to contact me should additional needs arise.    Time Spent Per Day:  Chart review was completed prior to outreach attempt.   08/30/2023: 5    AMANI PETWAY, CMA  Population Health  [facility] [place]

## 2023-08-30 NOTE — Progress Notes (Signed)
 Nutrition Follow-up  DOCUMENTATION CODES:  Morbid obesity  INTERVENTION:  Diet advancement per SLP; current on dysphagia 3, thin liquids  Will hold on ordering nutrition supplements until pt is off tube feeding to reduce amount of magnesium  pt is receiving secondary to myasthenia gravis.   TF via Cortrak: Transition to nocturnal regimen- Osmolite 1.5 at 55ml x12 hours (1800-0600) ( daily) 60ml ProSource TF20 once daily Provides 1070 kcal (83% of minimum estimated kcal needs), 61g protein (68% of minimum estimated protein needs), free water  daily  q6h free water  flush ( daily) per MD Total free water  (TF +FWF)-   NUTRITION DIAGNOSIS:  Inadequate oral intake related to acute illness (myasthenia gravis) as evidenced by NPO status. - remains applicable  GOAL:  Patient will meet greater than or equal to 90% of their needs - goal met via TF  MONITOR:  Vent status, Labs, Weight trends, TF tolerance, I & O's, Skin  REASON FOR ASSESSMENT:  Ventilator, Rounds    ASSESSMENT:  Pt admitted on 7/30 for sepsis d/t bilateral lower lobe PNA and splenic infarct. PMH significant for myasthenia gravis, asthma, OSA on nocturnal CPAP, chronic hypoxic and hypercapnic respiratory failure, HTN, hypothyroidism, depression  8/8: cortrak placed 8/10: extubated 8/11: bedside swallow- dysphagia 3, thin liquid diet  Spoke with patient at bedside. She is in good spirits. Reports that she passed her swallow assessment and is now on a diet. Called kitchen to ensure meal was on the way.   Since date of admission, pt reports that she had not taken any oral intake d/t acute illness and poor appetite though was eating at her baseline up until admission.   Discussed in interdisciplinary rounds.  Recommend transition to nocturnal tube feeding over the next 1-2 days until better able to assess pt's nutritional adequacy. MD amenable to this plan.   Admit weight: 128.6 kg Current  weight: 123.8 kg  Medications: miralax  daily, thiamine   Labs: reviewed  Diet Order:   Diet Order             DIET DYS 3 Room service appropriate? Yes; Fluid consistency: Thin  Diet effective now                   EDUCATION NEEDS:   No education needs have been identified at this time  Skin:  Skin Assessment: Reviewed RN Assessment  Last BM:  8/10 type 7 x4  Height:   Ht Readings from Last 1 Encounters:  08/25/23 5' (1.524 m)    Weight:   Wt Readings from Last 1 Encounters:  08/30/23 123.8 kg    Ideal Body Weight:  45.5 kg  BMI:  Body mass index is 53.3 kg/m.  Estimated Nutritional Needs:   Kcal:  1300-1500  Protein:  90-105g  Fluid:  >/=1.5L  Royce Maris, RDN, LDN Clinical Nutrition See AMiON for contact information.

## 2023-08-30 NOTE — Progress Notes (Signed)
 Pt wanted to hold CPT while she had company.  RT will attempt again at next visit.

## 2023-08-30 NOTE — Plan of Care (Signed)

## 2023-08-30 NOTE — Progress Notes (Addendum)
 NEUROLOGY CONSULT FOLLOW UP NOTE   Date of service: August 30, 2023 Patient Name: [name] MRN:  [id number] DOB:  January 26, 1965  Interval Hx/subjective  Pt seen bedside this morning, states that she is feeling much better. She has been able to move her extremities, and just passed her swallow evaluation.   Vitals   Vitals:   08/30/23 0600 08/30/23 0606 08/30/23 0700 08/30/23 0716  BP: (!) 110/52     Pulse: 65 69 76   Resp: 19 (!) 28 (!) 24   Temp:    98.1 F (36.7 C)  TempSrc:    Oral  SpO2: 100% 100% 93%   Weight:      Height:         Body mass index is 53.3 kg/m.  Physical Exam   Constitutional: Appears well-developed and well-nourished.  Psych: Affect appropriate to situation.  Eyes: No scleral injection.  HENT: No OP obstrucion.  Head: Normocephalic.  Cardiovascular: Normal rate and regular rhythm.  Respiratory: Effort normal, non-labored breathing, lungs clear to auscultation bilaterally   Neurologic Examination   Neuro: *MS: A&O x4. Follows multi-step commands.  *Speech: no dysarthria or aphasia, able to name and repeat. *CN:    I: Deferred   II,III: PERRLA, VFF by confrontation, 2/2 pupillary constriction, baseline disconjugate gaze with right eye lateral deviation    III,IV,VI: EOMI w/o nystagmus, no ptosis   V: Sensation intact from V1 to V3 to LT   VII: Eyelid closure was full.  Smile symmetric.   VIII: Hearing intact to voice   IX,X: Voice normal, palate elevates symmetrically    XI: SCM/trap 5/5 bilat   XII: Tongue protrudes midline, no atrophy or fasciculations  *Motor:   Normal bulk.  No tremor, rigidity or bradykinesia. No pronator drift.   Strength: Dlt Bic Tri WE WrF FgS Gr HF KnF KnE PlF DoF    Left 5 5 5 5 5 5 5 5 5 5 5 5     Right 5 5 5 5 5 5 5 5 5 5 5 5    *Sensory: Intact to light touch, pinprick *Reflexes:  2+ C5-C6, unable to perform patellar reflexes *Gait: deferred     Medications  Current Facility-Administered  Medications:    acetaminophen  (TYLENOL ) tablet 650 mg, 650 mg, Per Tube, Q6H PRN **OR** acetaminophen  (TYLENOL ) suppository 650 mg, 650 mg, Rectal, Q6H PRN, Geronimo, Murali, MD   albuterol  (PROVENTIL ) (2.5 MG/3ML) 0.083% nebulizer solution 2.5 mg, 2.5 mg, Nebulization, Q6H PRN, Tobie Jorie SAUNDERS, MD   albuterol  (PROVENTIL ) (2.5 MG/3ML) 0.083% nebulizer solution 2.5 mg, 2.5 mg, Nebulization, Q6H, Pawar, Rahul, MD, 2.5 mg at 08/30/23 0803   ARIPiprazole  (ABILIFY ) tablet 2 mg, 2 mg, Per Tube, Daily, Millen, Jessica B, RPH, 2 mg at 08/29/23 1016   budesonide  (PULMICORT ) nebulizer solution 0.5 mg, 0.5 mg, Nebulization, BID, Desai, Rahul P, PA-C, 0.5 mg at 08/30/23 0801   Chlorhexidine  Gluconate Cloth 2 % PADS 6 each, 6 each, Topical, Daily, Sherrill Cable [place], DO, 6 each at 08/29/23 0955   feeding supplement (PROSource TF20) liquid 60 mL, 60 mL, Per Tube, Daily, Pawar, Rahul, MD, 60 mL at 08/29/23 1016   feeding supplement (VITAL AF 1.2 CAL) liquid 1,000 mL, 1,000 mL, Per Tube, Continuous, Pawar, Rahul, MD, Last Rate: 45 mL/hr at 08/30/23 0400, Infusion Verify at 08/30/23 0400   free water  200 mL, 200 mL, Per Tube, Q6H, Jenna Maude BRAVO, NP, 200 mL at 08/30/23 0608   hydrALAZINE  (APRESOLINE ) injection 10 mg, 10 mg,  Intravenous, Q6H PRN, Rashid, Farhan, MD   levothyroxine  (SYNTHROID ) tablet 200 mcg, 200 mcg, Per Tube, QAC breakfast, Meade, Rahul P, PA-C, 200 mcg at 08/30/23 0507   lip balm (CARMEX) ointment, , Topical, PRN, Byrum, Robert S, MD   montelukast  (SINGULAIR ) tablet 10 mg, 10 mg, Per Tube, QHS, Desai, Rahul P, PA-C, 10 mg at 08/29/23 2222   mupirocin  cream (BACTROBAN ) 2 %, , Topical, BID, Dino Antu, MD, Given at 08/29/23 2216   mycophenolate  (CELLCEPT ) oral suspension 200 mg/mL, 1,000 mg, Per Tube, BID, Arora, Ashish, MD, 1,000 mg at 08/29/23 2218   ondansetron  (ZOFRAN ) tablet 4 mg, 4 mg, Per Tube, Q6H PRN **OR** ondansetron  (ZOFRAN ) injection 4 mg, 4 mg, Intravenous, Q6H PRN, Geronimo Amel, MD   Oral care mouth rinse, 15 mL, Mouth Rinse, 4 times per day, Shelah Lamar RAMAN, MD, 15 mL at 08/29/23 2219   Oral care mouth rinse, 15 mL, Mouth Rinse, PRN, Shelah Lamar RAMAN, MD   pantoprazole  (PROTONIX ) injection 40 mg, 40 mg, Intravenous, Daily, Desai, Rahul P, PA-C, 40 mg at 08/29/23 1010   polyethylene glycol (MIRALAX  / GLYCOLAX ) packet 17 g, 17 g, Per Tube, Daily, Jenna Maude BRAVO, NP   pyridostigmine  (MESTINON ) tablet 30 mg, 30 mg, Per Tube, 5 X Daily, Arora, Ashish, MD, 30 mg at 08/30/23 0724   sodium chloride  flush (NS) 0.9 % injection 10-40 mL, 10-40 mL, Intracatheter, Q12H, Geronimo Amel, MD, 10 mL at 08/29/23 2219   sodium chloride  flush (NS) 0.9 % injection 10-40 mL, 10-40 mL, Intracatheter, PRN, Geronimo Amel, MD   sodium chloride  flush (NS) 0.9 % injection 3 mL, 3 mL, Intravenous, Q6H, Pawar, Rahul, MD, 3 mL at 08/30/23 0412   thiamine  (VITAMIN B1) tablet 100 mg, 100 mg, Per Tube, Daily, Pawar, Rahul, MD, 100 mg at 08/29/23 1019  Labs and Diagnostic Imaging   CBC:  Recent Labs  Lab 08/24/23 0359 08/25/23 0315 08/25/23 1349 08/29/23 0549 08/30/23 0500  WBC 9.4 10.9*   < > 7.2 7.1  NEUTROABS 7.4 9.2*  --   --   --   HGB 10.6* 11.3*   < > 9.9* 10.2*  HCT 36.7 39.4   < > 31.5* 33.1*  MCV 102.2* 101.8*   < > 92.9 95.4  PLT 89* 109*   < > 179 221   < > = values in this interval not displayed.    Basic Metabolic Panel:  Lab Results  Component Value Date   NA 144 08/30/2023   K 4.1 08/30/2023   CO2 38 (H) 08/30/2023   GLUCOSE 98 08/30/2023   BUN 15 08/30/2023   CREATININE 0.60 08/30/2023   CALCIUM  9.1 08/30/2023   GFRNONAA >60 08/30/2023   GFRAA >60 12/21/2018   HgbA1c:  Lab Results  Component Value Date   HGBA1C 6.0 (H) 08/16/2016   INR  Lab Results  Component Value Date   INR 1.2 08/19/2023     Assessment   JENEVIEVE KIRSCHBAUM is a 58 y.o. female with hx of Musk negative Myathenia Gravis, moderate persistent asthma, OSA on CPAP, chronic  hypoxic and hypercarbic resp failure on 2L [place] PTA, admitted for acute on chronic respiratory failure in the setting of sepsis from bilateral lower lube pneumonia. Hospital course was complicated by splenic laceration and subcapsular hematoma. Pt became very weak and theree was concern for MG exacerbation for which she was intubated and started on IVIG. Her last dose was on Sunday.   Recommendations   Myasthenia Gravis Crisis, now  resolved  Pt has completed a 5 day course of IVIG for a total of 5 days. Overall appears well, and she feels like she is closer to her baseline. Neuro exam is reassuring as documented, and seems to have been improving day by day. She is alert, talking, and able to move all her extremities spontaneously. She has been recommended and soft diet by SLP today, which she is excited to try, and will be working with PT/OT today as well. She is s/p extubation and breathing on her home 2L without any trouble. She will be transferring out of the unit today.   Plan:  - Overall clinical course has improved, will need to establish with outpatient neurology here in [facility]  - Continue cellcept  and mestinon  - Daily NIF and FVC with RT - PT/OT   ______________________________________________________________________   Bonney Roetta Chars, MD Internal Medicine Resident, PGY-3  Electronically signed: Dr. Jorma Tassinari

## 2023-08-30 NOTE — Progress Notes (Signed)
 NAME:  [name], MRN:  [id number], DOB:  February 03, 1965, LOS: 12 ADMISSION DATE:  08/18/2023, CONSULTATION DATE:  08/25/23 REFERRING MD:  Sherrill CHIEF COMPLAINT:  AMS   History of Present Illness:  [name] is a 58 y.o. 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  She was admitted on 08/18/2023 for sepsis due to bilateral lower lobe pneumonia.  She was started on ceftriaxone  and doxycycline .  During admission, she was incidentally found to have splenic laceration and subcapsular hematoma for which she was seen by general surgery who opted for conservative management.  On 08/25/2023, she had change in her mental status along with complaints of increasing weakness, increased secretions, inability to swallow.  Her NIF was repeated and was -20 (-30 the day prior): Though, there was concern for suboptimal seal.  She had apparently not worn her CPAP overnight and she also had only received her last dose of pyridostigmine  overnight as she was unable to swallow appropriately on morning of 8/6.  She had an ABG which demonstrated worsening hypoxic and hypercapnic respiratory failure (7.3 2/103/74).  She was placed on BiPAP but due to concerns for worsening bulbar symptoms and impending respiratory failure, PCCM was asked to see in consultation for transfer to ICU.  Neurology has seen her already and is opting for IVIG when she transferred to the ICU.  Her oral medications including pyridostigmine  and mycophenolate  are being transitioned from oral to IV.  At the time of our evaluation, she is on BiPAP but has limited air entry.  She does awaken to voice and follows some simple commands but is slow to respond.  Pertinent  Medical History:  has Asthma exacerbation; GERD  (gastroesophageal reflux disease); Depression; Anxiety; Myasthenia gravis (HCC); Sleep apnea; Pulmonary emboli (HCC); Morbid obesity (HCC); Hypothyroidism; History of pulmonary embolus (PE); Sepsis due to pneumonia (HCC); Acute on chronic respiratory failure with hypoxia and hypercapnia (HCC); Elevated troponin; Thrombocytopenia (HCC); Moderate persistent asthma; Splenic laceration, initial encounter; and Acute respiratory failure with hypoxia and hypercapnia (HCC) on their problem list.   Significant Hospital Events: Including procedures, antibiotic start and stop dates in addition to other pertinent events   7/30 admit. 8/6 worsening bulbar symptoms, neuro and PCCM consulted, transferred to ICU, intubated. 8/8 planning for SBT pending CXR first 8/10 passed SBT. Strength and endurance much improved , extubated  Interim History / Subjective:   Extubated 08/29/2023 She is awake alert interactive Strong cough  Objective:  Blood pressure (!) 110/52, pulse 76, temperature 98.1 F (36.7 C), temperature source Oral, resp. rate (!) 24, height 5' (1.524 m), weight 123.8 kg, SpO2 93%.    Vent Mode: PCV;BIPAP FiO2 (%):  [40 %-100 %] 40 % Set Rate:  [18 bmp] 18 bmp PEEP:  [8 cmH20] 8 cmH20   Intake/Output Summary (Last 24 hours) at 08/30/2023 0839 Last data filed at 08/30/2023 9391 Gross per 24 hour  Intake 2059.4 ml  Output 950 ml  Net 1109.4 ml   Filed Weights   08/28/23 0500 08/29/23 0449 08/30/23 0320  Weight: 128.1 kg 124.6 kg 123.8 kg     Physical Exam: General: Middle-age, does not appear to be in distress HEENT: Nasal cannula in place, cortrak in place, moist oral mucosa Pulmonary: On nasal cannula, clear breath sounds Cardiac: S1-S2 appreciated Abdomen:  Soft, nontender No significant edema Neuro: Awake alert oriented, moving all extremities GU clear yellow  I reviewed last 24 h vitals and pain scores, last 48 h intake and output, last 24 h labs and trends, and last 24 h  imaging results.  Assessment & Plan:   Myasthenia gravis exacerbation -Continues to improve -Last IVIG today for 5/5 -On CellCept , on Mestinon   Acute on chronic hypoxic and hypercapnic respiratory failure - Intubated 8/6, extubated 8/10 -Has a strong cough -Voice is strong -Continue nasal cannula -BiPAP as needed  Speech evaluation today for swallowing  Electrolyte derangement hypernatremia, hypokalemia - Being repleted  Hypertension - Will reinitiate medications  Hypothyroidism - On levothyroxine   Depression - Reinitiate medications as tolerated  Obesity - Was on Wegovy - Can reinitiate as outpatient  Anemia - Continue to trend  Nontraumatic splenic laceration with hematoma - Stable  Speech evaluation today Continue other lines of care - May be able to lose cortrak if able to maintain calories  Best practice (evaluated daily):  Diet/type: NPO, tube feeding ongoing DVT prophylaxis: SCD Pressure ulcer(s): pressure ulcer assessment deferred  GI prophylaxis: PPI Lines: N/A Foley:  N/A Code Status:  full code Last date of multidisciplinary goals of care discussion: None yet. Family: Discussed plans with the patient and family at bedside  The patient is critically ill with multiple organ systems failure and requires high complexity decision making for assessment and support, frequent evaluation and titration of therapies, application of advanced monitoring technologies and extensive interpretation of multiple databases. Critical Care Time devoted to patient care services described in this note independent of APP/resident time (if applicable)  is 30 minutes.   Jennet Epley MD [facility] Personal pager: See Amion If unanswered, please page CCM On-call: #[phone]

## 2023-08-30 NOTE — Progress Notes (Signed)
   08/30/23 2144  BiPAP/CPAP/SIPAP  $ Non-Invasive Ventilator  Non-Invasive Vent Subsequent  BiPAP/CPAP/SIPAP Pt Type Adult  BiPAP/CPAP/SIPAP SERVO  Mask Type Full face mask  Mask Size Medium  Set Rate 12 breaths/min  Respiratory Rate 27 breaths/min  Pressure Support 16 cmH20  PEEP 6 cmH20  FiO2 (%) 40 %  Minute Ventilation 12  Leak 60  Peak Inspiratory Pressure (PIP) 15  Tidal Volume (Vt) 410  Patient Home Machine No  Patient Home Mask No  Patient Home Tubing No  Auto Titrate No  Device Plugged into RED Power Outlet Yes  Oxygen  Percent 40 %  BiPAP/CPAP /SiPAP Vitals  Pulse Rate 68  Resp 18  SpO2 99 %  Bilateral Breath Sounds Clear;Diminished  MEWS Score/Color  MEWS Score 0  MEWS Score Color Green

## 2023-08-30 NOTE — Evaluation (Addendum)
 Clinical/Bedside Swallow Evaluation Patient Details  Name: [name] MRN: [id number] Date of Birth: 1965-09-10  Today's Date: 08/30/2023 Time: SLP Start Time (ACUTE ONLY): 9094 SLP Stop Time (ACUTE ONLY): 0917 SLP Time Calculation (min) (ACUTE ONLY): 12 min  Past Medical History:  Past Medical History:  Diagnosis Date   Asthma    Depression    Heart murmur    Myasthenia gravis (HCC)    Obesity    Pulmonary embolism (HCC)    Sleep apnea    Thyroid  disease    Past Surgical History:  Past Surgical History:  Procedure Laterality Date   BREAST SURGERY  06/2019   breast reduction   THYROID  SURGERY     TONSILLECTOMY     TUBAL LIGATION     HPI:  [name] is a 58 y.o. female who was admitted on 08/18/2023 for sepsis due to bilateral lower lobe pneumonia. ETT 08/24/08.  Pt with hx MG on IVIG as of 8/8 for 5 days.  CXR 8/8: Low lung volumes with mild infiltrates or edema, left worse than right, and suspect small to moderate left pleural effusion. 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    Assessment / Plan / Recommendation  Clinical Impression  Pt presents with functional swallowing as assessed clinically.  Pt tolerated all consistencies trialed including serial straw sips of thin liquid without any clinical s/s of aspiration.  Her vital signs (RR and SpO2) remained steady throught trials and there was no change in vocal quality.  She has low intensity, slightly hoarse vocal quality following extubation, but is able to attain adequate phonation for conversation.  She exhibited adequate oral clearance of solids with additional time given edentulsim.  Her dentures are not present, but she does typically wear them with PO intake.  Discussed diet preference with pt who would prefer  mechanical soft solids at this time for ease of mastication.  Given pt's hx of recent B pna and MG, for which she is currently on last day of IVIG, there is a low threshold for instrumental study if pt has any difficulty with PO intake.  Recommend indirect monitoring for POs and verbal cuing if necessary for impulsivity.    Recommend mecahnical soft diet with thin liquids.   SLP Visit Diagnosis: Dysphagia, unspecified (R13.10)    Aspiration Risk  Mild aspiration risk    Diet Recommendation Dysphagia 3 (Mech soft);Thin liquid    Liquid Administration via: Cup;Straw Medication Administration: Whole meds with liquid (As tolerated) Supervision: Intermittent supervision to cue for compensatory strategies Compensations: Slow rate;Small sips/bites Postural Changes: Seated upright at 90 degrees    Other  Recommendations Oral Care Recommendations: Oral care BID     Assistance Recommended at Discharge  N/A  Functional Status Assessment Patient has had a recent decline in their functional status and demonstrates the ability to make significant improvements in function in a reasonable and predictable amount of time.  Frequency and Duration min 2x/week  2 weeks       Prognosis Prognosis for improved oropharyngeal function: Good      Swallow Study   General Date of Onset: 08/18/23 HPI: [name] is a 58 y.o. female who was admitted on 08/18/2023 for sepsis due to bilateral lower lobe pneumonia. ETT 08/24/08.  Pt with hx MG on IVIG as of  8/8 for 5 days.  CXR 8/8: Low lung volumes with mild infiltrates or edema, left worse than right, and suspect small to moderate left pleural effusion. 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 Type of Study: Bedside Swallow Evaluation Previous Swallow  Assessment: None Diet Prior to this Study: NPO Temperature Spikes Noted: No Respiratory Status: Nasal cannula History of Recent Intubation: Yes Total duration of intubation (days): 4 days Date extubated: 08/29/23 Behavior/Cognition: Alert;Cooperative;Pleasant mood Oral Cavity Assessment: Within Functional Limits Oral Care Completed by SLP: Yes Oral Cavity - Dentition: Edentulous;Dentures, not available Vision: Functional for self-feeding Self-Feeding Abilities: Able to feed self Patient Positioning: Upright in bed Baseline Vocal Quality: Hoarse;Low vocal intensity Volitional Cough: Strong Volitional Swallow: Able to elicit    Oral/Motor/Sensory Function Overall Oral Motor/Sensory Function: Within functional limits   Ice Chips Ice chips: Not tested   Thin Liquid Thin Liquid: Within functional limits Presentation: Cup;Spoon;Straw    Nectar Thick Nectar Thick Liquid: Not tested   Honey Thick Honey Thick Liquid: Not tested   Puree Puree: Within functional limits Presentation: Spoon   Solid     Solid: Within functional limits Presentation: Self Fed      Anette FORBES Grippe, MA, CCC-SLP Acute Rehabilitation Services Office: [phone] 08/30/2023,9:35 AM

## 2023-08-30 NOTE — Progress Notes (Signed)
 Physical Therapy Treatment Patient Details Name: [name] MRN: [id number] DOB: 27-Feb-1965 Today's Date: 08/30/2023   History of Present Illness [name] is a 58 y.o. female who presented with worsening SOB. Found to have PNA and CTA 7/30 w/ apparent splenic lacerations in the midpole region with surrounding subcapsular hematoma. No free fluid in the abdomen or pelvis. No active extravasation of contrast. Worsening respiratory status on 08/25/23, transferred to ICU and intubated. PMHx: obesity, depression, asthma, OSA, myasthenia gravis, chronic hypoxic hypercarbic respiratory failure on 2 L O2, remote history of PE (not on anticoagulation), HTN, hypothyroidism    PT Comments  Pt is making good progress towards her goals today. Transferring to nursing floor from ICU. Pt continues to be limited in safe mobility by increased fatigue, and O2 demand in presence of generalized weakness and associated impaired balance. Pt is currently mod-maxAx2 for bed mobility and modAx2 for sit to stand and stand pivot transfers. D/c plans remain appropriate. PT will continue to follow acutely and has referred pt to Mobility Specialist for increased mobilization.     If plan is discharge home, recommend the following: Two people to help with walking and/or transfers;Assistance with cooking/housework;Help with stairs or ramp for entrance;Assist for transportation   Can travel by private vehicle     No  Equipment Recommendations  Wheelchair cushion (measurements PT);Wheelchair (measurements PT);Hospital bed (may need hoyer lift pending progress; bariatric sized equipment, height adjustable to youth height (pt reports 72ft 0 height))       Precautions / Restrictions Precautions Precautions: Fall Recall of Precautions/Restrictions: Impaired Precaution/Restrictions Comments: intubated Restrictions Weight Bearing Restrictions Per Provider Order: No     Mobility  Bed Mobility Overal bed mobility:  Needs Assistance Bed Mobility: Supine to Sit, Sit to Supine Rolling: Mod assist   Supine to sit: +2 for physical assistance, HOB elevated, Used rails, Mod assist Sit to supine: +2 for physical assistance, Max assist   General bed mobility comments: with increased cuing pt able to utilize bedrail to pull to EoB, requires modA for pad scoot of hips towards EOB. requires total A to return LE to bed    Transfers Overall transfer level: Needs assistance Equipment used: Rolling walker (2 wheels) Transfers: Sit to/from Stand Sit to Stand: Mod assist, +2 safety/equipment   Step pivot transfers: Mod assist, +2 physical assistance       General transfer comment: pt moving to 2W and needing to transfer from ICU bed, pt is able to stand with modAx2 and take pivotal steps with minAx2, to transfer beds    Ambulation/Gait               General Gait Details: unable at this time     Balance Overall balance assessment: Needs assistance Sitting-balance support: Feet supported Sitting balance-Leahy Scale: Fair Sitting balance - Comments: able to static sit, EoB ~5 min, cue for forward lean but pt able to adjust herself                                    Communication Communication Communication: Impaired Factors Affecting Communication: Trach/intubated;Other (comment) (nodding head)  Cognition Arousal: Alert Behavior During Therapy: Flat affect   PT - Cognitive impairments: Sequencing, Problem solving                       PT - Cognition Comments: slowed response to questions, needs cuing to sequence  activities Following commands: Impaired Following commands impaired: Follows one step commands with increased time    Cueing Cueing Techniques: Verbal cues, Gestural cues, Tactile cues     General Comments General comments (skin integrity, edema, etc.): 5L O2 via [place], VSS      Pertinent Vitals/Pain Pain Assessment Pain Assessment: Faces Faces Pain Scale:  Hurts little more Pain Location: feet Pain Descriptors / Indicators: Discomfort Pain Intervention(s): Limited activity within patient's tolerance, Monitored during session, Repositioned     PT Goals (current goals can now be found in the care plan section) Acute Rehab PT Goals Patient Stated Goal: to improve mobility PT Goal Formulation: With patient Time For Goal Achievement: 09/09/23 Potential to Achieve Goals: Good Progress towards PT goals: Progressing toward goals    Frequency    Min 2X/week       AM-PAC PT 6 Clicks Mobility   Outcome Measure  Help needed turning from your back to your side while in a flat bed without using bedrails?: A Lot Help needed moving from lying on your back to sitting on the side of a flat bed without using bedrails?: A Lot Help needed moving to and from a bed to a chair (including a wheelchair)?: Total Help needed standing up from a chair using your arms (e.g., wheelchair or bedside chair)?: Total (+2 assist) Help needed to walk in hospital room?: Total Help needed climbing 3-5 steps with a railing? : Total 6 Click Score: 8    End of Session Equipment Utilized During Treatment: Gait belt;Oxygen  Activity Tolerance: Patient limited by fatigue Patient left: in bed (transferred to 2W, NT in room at end of session) Nurse Communication: Mobility status;Need for lift equipment PT Visit Diagnosis: Other abnormalities of gait and mobility (R26.89);Muscle weakness (generalized) (M62.81)     Time: 8458-8394 PT Time Calculation (min) (ACUTE ONLY): 24 min  Charges:    $Therapeutic Activity: 23-37 mins PT General Charges $$ ACUTE PT VISIT: 1 Visit                     Massiah Minjares B. Fleeta Lapidus PT, DPT Acute Rehabilitation Services Please use secure chat or  Call Office [phone]    [name] 08/30/2023, 4:23 PM

## 2023-08-31 DIAGNOSIS — F32A Depression, unspecified: Secondary | ICD-10-CM

## 2023-08-31 DIAGNOSIS — G5793 Unspecified mononeuropathy of bilateral lower limbs: Secondary | ICD-10-CM | POA: Diagnosis not present

## 2023-08-31 DIAGNOSIS — J189 Pneumonia, unspecified organism: Secondary | ICD-10-CM | POA: Diagnosis not present

## 2023-08-31 DIAGNOSIS — J9601 Acute respiratory failure with hypoxia: Secondary | ICD-10-CM | POA: Diagnosis not present

## 2023-08-31 DIAGNOSIS — G7000 Myasthenia gravis without (acute) exacerbation: Secondary | ICD-10-CM | POA: Diagnosis not present

## 2023-08-31 DIAGNOSIS — J9621 Acute and chronic respiratory failure with hypoxia: Secondary | ICD-10-CM | POA: Diagnosis not present

## 2023-08-31 DIAGNOSIS — D696 Thrombocytopenia, unspecified: Secondary | ICD-10-CM

## 2023-08-31 DIAGNOSIS — R7989 Other specified abnormal findings of blood chemistry: Secondary | ICD-10-CM | POA: Diagnosis not present

## 2023-08-31 LAB — MAGNESIUM: Magnesium: 2.3 mg/dL (ref 1.7–2.4)

## 2023-08-31 LAB — CBC WITH DIFFERENTIAL/PLATELET
Abs Immature Granulocytes: 0.04 K/uL (ref 0.00–0.07)
Basophils Absolute: 0.1 K/uL (ref 0.0–0.1)
Basophils Relative: 1 %
Eosinophils Absolute: 0.3 K/uL (ref 0.0–0.5)
Eosinophils Relative: 4 %
HCT: 35.6 % — ABNORMAL LOW (ref 36.0–46.0)
Hemoglobin: 10.7 g/dL — ABNORMAL LOW (ref 12.0–15.0)
Immature Granulocytes: 1 %
Lymphocytes Relative: 13 %
Lymphs Abs: 1 K/uL (ref 0.7–4.0)
MCH: 29 pg (ref 26.0–34.0)
MCHC: 30.1 g/dL (ref 30.0–36.0)
MCV: 96.5 fL (ref 80.0–100.0)
Monocytes Absolute: 0.7 K/uL (ref 0.1–1.0)
Monocytes Relative: 10 %
Neutro Abs: 5.2 K/uL (ref 1.7–7.7)
Neutrophils Relative %: 71 %
Platelets: 278 K/uL (ref 150–400)
RBC: 3.69 MIL/uL — ABNORMAL LOW (ref 3.87–5.11)
RDW: 14.9 % (ref 11.5–15.5)
WBC: 7.3 K/uL (ref 4.0–10.5)
nRBC: 0 % (ref 0.0–0.2)

## 2023-08-31 LAB — COMPREHENSIVE METABOLIC PANEL WITH GFR
ALT: 25 U/L (ref 0–44)
AST: 33 U/L (ref 15–41)
Albumin: 2.6 g/dL — ABNORMAL LOW (ref 3.5–5.0)
Alkaline Phosphatase: 39 U/L (ref 38–126)
Anion gap: 8 (ref 5–15)
BUN: 16 mg/dL (ref 6–20)
CO2: 37 mmol/L — ABNORMAL HIGH (ref 22–32)
Calcium: 9.1 mg/dL (ref 8.9–10.3)
Chloride: 96 mmol/L — ABNORMAL LOW (ref 98–111)
Creatinine, Ser: 0.55 mg/dL (ref 0.44–1.00)
GFR, Estimated: >60 mL/min (ref >60)
Glucose, Bld: 119 mg/dL — ABNORMAL HIGH (ref 70–99)
Potassium: 4.1 mmol/L (ref 3.5–5.1)
Sodium: 141 mmol/L (ref 135–145)
Total Bilirubin: 0.7 mg/dL (ref 0.0–1.2)
Total Protein: 7.4 g/dL (ref 6.5–8.1)

## 2023-08-31 LAB — PHOSPHORUS: Phosphorus: 3.4 mg/dL (ref 2.5–4.6)

## 2023-08-31 LAB — GLUCOSE, CAPILLARY
Glucose-Capillary: 106 mg/dL — ABNORMAL HIGH (ref 70–99)
Glucose-Capillary: 91 mg/dL (ref 70–99)
Glucose-Capillary: 106 mg/dL — ABNORMAL HIGH (ref 70–99)

## 2023-08-31 MED ORDER — LOSARTAN POTASSIUM 50 MG PO TABS
50.0000 mg | ORAL_TABLET | Freq: Every day | ORAL | Status: DC
  Administered 2023-09-01 – 2023-09-06 (×6): 50 mg via ORAL
  Filled 2023-08-31 (×6): qty 1

## 2023-08-31 MED ORDER — CAPSAICIN 0.025 % EX CREA
TOPICAL_CREAM | Freq: Two times a day (BID) | CUTANEOUS | Status: DC
  Filled 2023-08-31: qty 60

## 2023-08-31 MED ORDER — ALBUTEROL SULFATE (2.5 MG/3ML) 0.083% IN NEBU
2.5000 mg | INHALATION_SOLUTION | Freq: Two times a day (BID) | RESPIRATORY_TRACT | Status: DC
  Administered 2023-09-01 – 2023-09-07 (×15): 2.5 mg via RESPIRATORY_TRACT
  Filled 2023-08-31 (×13): qty 3

## 2023-08-31 NOTE — Progress Notes (Signed)
 Occupational Therapy Treatment Patient Details Name: [name] MRN: [id number] DOB: 10-Jun-1965 Today's Date: 08/31/2023   History of present illness [name] is a 58 y.o. female who presented with worsening SOB. Found to have PNA and CTA 7/30 w/ apparent splenic lacerations in the midpole region with surrounding subcapsular hematoma. No free fluid in the abdomen or pelvis. No active extravasation of contrast. Worsening respiratory status on 08/25/23, transferred to ICU and intubated. PMHx: obesity, depression, asthma, OSA, myasthenia gravis, chronic hypoxic hypercarbic respiratory failure on 2 L O2, remote history of PE (not on anticoagulation), HTN, hypothyroidism   OT comments  Patient demonstrating good gains with OT treatment. Patient was mod assist to get to EOB and was able to met goal for sitting on EOB 5+ minutes with supervision. Patient able to stand x3 with mod assist +2 and was able to take side steps towards [facility] on last stand. Patient requiring max assist to return to supine due to assistance needed with BLEs. Discharge recommendations continue to be appropriate. Acute OT to continue to follow to address established goals to facilitate DC to next venue of care.        If plan is discharge home, recommend the following:  Two people to help with walking and/or transfers;Two people to help with bathing/dressing/bathroom;Assistance with cooking/housework;Assistance with feeding;Direct supervision/assist for medications management;Direct supervision/assist for financial management;Assist for transportation;Help with stairs or ramp for entrance   Equipment Recommendations  Other (comment) (defer)    Recommendations for Other Services      Precautions / Restrictions Precautions Precautions: Fall Recall of Precautions/Restrictions: Impaired Restrictions Weight Bearing Restrictions Per Provider Order: No       Mobility Bed Mobility Overal bed mobility: Needs  Assistance Bed Mobility: Supine to Sit, Sit to Supine     Supine to sit: Mod assist, HOB elevated, Used rails Sit to supine: Max assist   General bed mobility comments: increased time and assistance with trunk and scooting to EOB to get to EOB and assistance with BLE to return to supine    Transfers Overall transfer level: Needs assistance Equipment used: Rolling walker (2 wheels) Transfers: Sit to/from Stand Sit to Stand: Mod assist, +2 safety/equipment           General transfer comment: performed 3 stands from EOB with mod assist +2 and was able to take steps towards [facility]     Balance Overall balance assessment: Needs assistance Sitting-balance support: Feet supported Sitting balance-Leahy Scale: Fair Sitting balance - Comments: able to tolerate 15 minutes on EOB with supervision   Standing balance support: Bilateral upper extremity supported, During functional activity Standing balance-Leahy Scale: Poor Standing balance comment: +2 assist and support of RW for standing balance                           ADL either performed or assessed with clinical judgement   ADL Overall ADL's : Needs assistance/impaired Eating/Feeding: Set up;Bed level   Grooming: Wash/dry hands;Wash/dry face;Set up;Sitting           Upper Body Dressing : Minimal assistance   Lower Body Dressing: Total assistance Lower Body Dressing Details (indicate cue type and reason): socks               General ADL Comments: focused on sitting EOB and sit to stands    Extremity/Trunk Assessment              Vision  Perception     Praxis     Communication Communication Communication: Impaired Factors Affecting Communication: Difficulty expressing self (soft speech)   Cognition Arousal: Alert Behavior During Therapy: Flat affect Cognition: No apparent impairments             OT - Cognition Comments: Patient following commands with increased time                  Following commands: Impaired Following commands impaired: Follows one step commands with increased time      Cueing   Cueing Techniques: Verbal cues, Gestural cues, Tactile cues  Exercises      Shoulder Instructions       General Comments 4L O2 with SpO2 94%    Pertinent Vitals/ Pain       Pain Assessment Pain Assessment: Faces Faces Pain Scale: Hurts a little bit Pain Location: generalized when standing Pain Descriptors / Indicators: Grimacing Pain Intervention(s): Limited activity within patient's tolerance, Monitored during session, Repositioned  Home Living                                          Prior Functioning/Environment              Frequency  Min 2X/week        Progress Toward Goals  OT Goals(current goals can now be found in the care plan section)  Progress towards OT goals: Progressing toward goals  Acute Rehab OT Goals Patient Stated Goal: to get better OT Goal Formulation: With patient/family Time For Goal Achievement: 09/09/23 Potential to Achieve Goals: Fair ADL Goals Pt Will Perform Grooming: with min assist;sitting Pt Will Perform Upper Body Dressing: with min assist;sitting Pt Will Perform Lower Body Dressing: with supervision;sit to/from stand Pt Will Transfer to Toilet: with supervision;ambulating Additional ADL Goal #1: Pt will complete will bed mobility with moderate assistance as a precursor to ADLs. Additional ADL Goal #2: Pt will demonstrate fair sitting balance x 5 minutes in preparation for ADLs. Additional ADL Goal #3: Pt will stand with +2 mod assist as a precursor to toilet transfers.  Plan      Co-evaluation                 AM-PAC OT 6 Clicks Daily Activity     Outcome Measure   Help from another person eating meals?: A Little Help from another person taking care of personal grooming?: A Little Help from another person toileting, which includes using toliet, bedpan, or urinal?:  Total Help from another person bathing (including washing, rinsing, drying)?: A Lot Help from another person to put on and taking off regular upper body clothing?: A Lot Help from another person to put on and taking off regular lower body clothing?: Total 6 Click Score: 12    End of Session Equipment Utilized During Treatment: Gait belt;Rolling walker (2 wheels);Oxygen   OT Visit Diagnosis: Muscle weakness (generalized) (M62.81)   Activity Tolerance Patient tolerated treatment well   Patient Left in bed;with call bell/phone within reach;with bed alarm set;with family/visitor present   Nurse Communication Mobility status        Time: 1317-1340 OT Time Calculation (min): 23 min  Charges: OT General Charges $OT Visit: 1 Visit OT Treatments $Self Care/Home Management : 8-22 mins $Therapeutic Activity: 8-22 mins  Dick Laine, OTA Acute Rehabilitation Services  Office [phone]   Jeb LITTIE Laine 08/31/2023, 2:45  PM

## 2023-08-31 NOTE — Plan of Care (Signed)

## 2023-08-31 NOTE — Progress Notes (Addendum)
 Delay in feeding pump delivery to bed as well as nutrition osmolite, Tube feed started at 2227, should continue until 1030AM 09/01/23 for full time of feeding.

## 2023-08-31 NOTE — Progress Notes (Signed)
   08/31/23 2107  BiPAP/CPAP/SIPAP  $ Non-Invasive Ventilator  Non-Invasive Vent Subsequent  BiPAP/CPAP/SIPAP Pt Type Adult  BiPAP/CPAP/SIPAP SERVO  Mask Type Full face mask  Dentures removed? Yes - Placed in denture cup  Mask Size Medium  Set Rate 12 breaths/min  Respiratory Rate 28 breaths/min  IPAP 16 cmH20  EPAP 6 cmH2O  Pressure Support 10 cmH20  PEEP 6 cmH20  FiO2 (%) 40 %  Flow Rate 0 lpm  Minute Ventilation 11.1  Leak 57  Peak Inspiratory Pressure (PIP) 16  Tidal Volume (Vt) 422  Patient Home Machine No  Patient Home Mask No  Patient Home Tubing No  Auto Titrate No  Minimum cmH2O 6 cmH2O  Maximum cmH2O 12 cmH2O  Press High Alarm 30 cmH2O  CPAP/SIPAP surface wiped down Yes  Device Plugged into RED Power Outlet Yes  Oxygen  Percent 40 %  BiPAP/CPAP /SiPAP Vitals  Pulse Rate 72  Resp (!) 28  SpO2 95 %  Bilateral Breath Sounds Diminished  MEWS Score/Color  MEWS Score 2  MEWS Score Color Yellow

## 2023-08-31 NOTE — Progress Notes (Signed)
 Speech Language Pathology Treatment: Dysphagia  Patient Details Name: [name] MRN: [id number] DOB: 08-03-1965 Today's Date: 08/31/2023 Time: 8762-8749 SLP Time Calculation (min) (ACUTE ONLY): 13 min  Assessment / Plan / Recommendation Clinical Impression  Pt seen for SLP follow up to assess diet tolerance and advance diet as indicated. Pt reported consuming a regular diet and thin liquids at baseline. She feels that her swallow function is back to baseline. RN reported good PO intake without overt or subtle s/s of aspiration observed. Pt now has full set of dentures available, which were in place. Mastication and transit of regular solid observed to be WNL with adequate oral clearance. Pharyngeal swallow initiation appeared prompt with laryngeal elevation noted. No overt or subtle s/s of aspiration were observed.   Diet advanced to regular consistency with continuation of thin liquids. PO meds as tolerated. No further acute SLP needs identified at this time. SLP will sign off. Please re-consult if pt exhibits concerns for aspiration with PO intake.    HPI HPI: [name] is a 58 y.o. female who was admitted on 08/18/2023 for sepsis due to bilateral lower lobe pneumonia. ETT 08/24/08.  Pt with hx MG on IVIG as of 8/8 for 5 days.  CXR 8/8: Low lung volumes with mild infiltrates or edema, left worse than right, and suspect small to moderate left pleural effusion. 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      SLP Plan  Discharge SLP treatment due to (comment);All goals met          Recommendations  Diet recommendations: Regular;Thin liquid Liquids provided via: Cup;Straw Medication Administration: Whole meds with liquid Supervision: Patient able to self feed Compensations:  Slow rate;Small sips/bites Postural Changes and/or Swallow Maneuvers: Seated upright 90 degrees                  Oral care BID    (defer to PT/OT)  (r/o dysphagia)     Discharge SLP treatment due to (comment);All goals met     [name]  08/31/2023, 1:37 PM

## 2023-08-31 NOTE — Progress Notes (Signed)
 Pt taken off BiPAP and placed on 4L [place] by RT. Pt tolerating well at this time, no increased WOB noted, SpO2 94%, RN aware, BiPAP at  bedside in standby.    08/31/23 0746  Therapy Vitals  Pulse Rate 80  Resp (!) 22  MEWS Score/Color  MEWS Score 1  MEWS Score Color Green  Respiratory Assessment  Assessment Type Assess only  Respiratory Pattern Regular;Unlabored  Chest Assessment Chest expansion symmetrical  Cough Non-productive  Bilateral Breath Sounds Diminished  Oxygen  Therapy/Pulse Ox  O2 Device (S)  Nasal Cannula  O2 Therapy Oxygen   O2 Flow Rate (L/min) 4 L/min  FiO2 (%) 94 %

## 2023-08-31 NOTE — Progress Notes (Addendum)
 PROGRESS NOTE    [name]  FMW:[id number] DOB: 1965/08/06 DOA: 08/18/2023 PCP: Minerva Rosina HERO, MD   Brief Narrative:  [name] is a 58 y.o. female with medical history significant for myasthenia gravis, moderate persistent asthma, chronic hypoxic and hypercarbic respiratory failure on 2 L O2 [place], history of PE no longer on anticoagulation, HTN, hypothyroidism, depression, morbid obesity, OSA on CPAP who was admitted with sepsis and acute on chronic hypoxic respiratory failure due to bibasilar pneumonia.  On the AM of 08/25/23 Patient decompensated and became more encephlopathic and likely 2/2 to Myasthenia Gravis Flare. Neuro urgently consulted and ABG checked and patient was hypercarbic. Placed on BiPAP and PCCM consulted and she was transferred to the ICU and then subsequently intubated.  She was stabilized and subsequently weaned off the ventilator and extubated on 08/29/2023.  Neurology recommended continuing IVIG for 5 days total.  Subsequently she was transferred back to the [facility] service on 08/31/2023 and PT OT recommending SNF and she is agreeable now.  Assessment and Plan:  Myasthenia gravis exacerbation with associated respiratory muscle weakness.  She is status post  5 days of IVIG; Continue her mycophenolate  1000 mg p.o. twice daily and Pyridostigmine  30 mg p.o. 5 times daily.  Follow her neurological exam, NIF, FVC.  She will need to follow-up with neurology in outpatient setting and neurology offered her PLEX treatment however patient would like a trial of PT/OT.  Neurology has now signed off the case.   Acute on chronic hypoxemic and hypercapnic respiratory failure due to restrictive disease from the above as well as history of OSA/OHS (on CPAP), asthma.  Earlier on admission she was thought to have sepsis secondary to bibasilar pneumonia and was treated with antibiotics.  She completed antibiotic therapy and she is now afebrile and has no leukocytosis.  Subsequently she  ended up having a myasthenia gravis flare and had to be intubated and transferred to the intensive care unit.  Was extubated on 08/29/2023.  Continuing BiPAP support intermittently and she to wear it mandatory nightly given her history of OSA/OHS.  She has bibasilar atelectasis versus infiltrate, pushing pulmonary hygiene and holding off on antibiotics for now.  Repeat chest x-ray in the a.m. and continue with budesonide  0.5 mg p.o. twice daily, albuterol  neb 2.5 mg every 6 as needed, continue with montelukast  10 mg p.o. daily and continue pulmonary hygiene.   Hypernatremia and Hypokalemia.  Resolved.    Essential Hypertension:  Her home Losartan  50 mg po Daily is on hold.  Will restart in the a.m. CTM BP per Protocol. Last BP reading was 123/47   Hypothyroidism: TSH was 3.286. C/w Levothyroxine  200 mcg po Daily   Depression and Anxiety: Continue Aripiprazole  2 mg po Daily and c/w Pregabalin  200 mg po BID; Mirtazapine  15 mg po at bedtime still held     Nontraumatic splenic laceration with hematoma, unclear cause.  Patient stated that she fell in the restroom about 2 or 3 months ago.  CT abdomen pelvis showed indeterminate heterogeneous enhancement of the spleen with mild enlargement when compared to prior and splenic laceration and subscapular fluid collection or possibly as well as infection.  Repeat CTA showed markedly abnormal skin with splenic lacerations at the mid pole and subscapular hematoma.  Surgery evaluation appreciated and they initially recommended Supportive care and signed off the case 8/5.  We would reimage if any evidence of progressive anemia, pain.  Hemoglobin/hematocrit trend as below and will avoid anticoagulants but if she does  decompensate may need iron involvement.   Normocytic Anemia: Hgb/Hct Trend:  Recent Labs  Lab 08/25/23 1349 08/26/23 0402 08/27/23 0355 08/28/23 0345 08/29/23 0549 08/30/23 0500 08/31/23 1009  HGB 11.6* 9.8* 10.0* 9.9* 9.9* 10.2* 10.7*  HCT 34.0*  32.9* 32.9* 32.1* 31.5* 33.1* 35.6*  MCV  --  100.9* 97.1 95.5 92.9 95.4 96.5  -Check Anemia Panel int he AM. CTM for S/Sx of Bleeding; No overt bleeding noted. Repeat CBC in the AM  Hypoalbuminemia: Patient's Albumin Lvl went from 3.2 -> 3.0 -> 2.9 -> 2.6. CTM and Trend and repeat CMP in the AM  OSA: Continue with CPAP nightly  Class III (Super Morbid) Obesity: Complicates overall prognosis and care. Estimated body mass index is 53.3 kg/m as calculated from the following:   Height as of this encounter: 5' (1.524 m).   Weight as of this encounter: 123.8 kg. Weight Loss and Dietary Counseling given.  Her Wegovy is on hold.  Will need to reassess as an outpatient the timing of restarting based on recovery from this acute illness.   DVT prophylaxis: SCDs Start: 08/18/23 2056    Code Status: Full Code Family Communication: No family currently at bedside  Disposition Plan:  Level of care: Med-Surg Status is: Inpatient Remains inpatient appropriate because: PT OT recommending SNF as she is improving daily.   Consultants:  Neurology PCCM transfer General Surgery  Procedures:  As delineated as above  Antimicrobials:  Anti-infectives (From admission, onward)    Start     Dose/Rate Route Frequency Ordered Stop   08/20/23 1400  doxycycline  (VIBRAMYCIN ) 100 mg in sodium chloride  0.9 % 250 mL IVPB        100 mg 125 mL/hr over 120 Minutes Intravenous Every 12 hours 08/20/23 1247 08/27/23 1149   08/19/23 1800  cefTRIAXone  (ROCEPHIN ) 2 g in sodium chloride  0.9 % 100 mL IVPB        2 g 200 mL/hr over 30 Minutes Intravenous Every 24 hours 08/18/23 2101 08/27/23 1807   08/18/23 2200  doxycycline  (VIBRA -TABS) tablet 100 mg  Status:  Discontinued        100 mg Oral Every 12 hours 08/18/23 2156 08/20/23 1247   08/18/23 1730  cefTRIAXone  (ROCEPHIN ) 2 g in sodium chloride  0.9 % 100 mL IVPB        2 g 200 mL/hr over 30 Minutes Intravenous Once 08/18/23 1726 08/18/23 1841        Subjective: Seen and examined at bedside and she was doing much better and more interactive today compared to the first time I saw her.  She is awake and alert and answers questions appropriately.  Feels okay and denies any pain.  No nausea or vomiting.  Agreeable to SNF.  No other concerns or complaints at this time.  Objective: Vitals:   08/31/23 0746 08/31/23 0747 08/31/23 1223 08/31/23 1633  BP:  (!) 110/51 (!) 116/53 (!) 123/47  Pulse: 80 74 68 77  Resp: (!) 22 (!) 24 18 18   Temp:  (!) 97 F (36.1 C) 99.1 F (37.3 C) 98.6 F (37 C)  TempSrc:  Axillary    SpO2:  93% 96% 95%  Weight:      Height:        Intake/Output Summary (Last 24 hours) at 08/31/2023 1757 Last data filed at 08/31/2023 1653 Gross per 24 hour  Intake 1711 ml  Output 600 ml  Net 1111 ml   Filed Weights   08/28/23 0500 08/29/23 0449 08/30/23 0320  Weight:  128.1 kg 124.6 kg 123.8 kg   Examination: Physical Exam:  Constitutional: Super morbidly obese chronically ill-appearing African-American female in no acute distress now Respiratory: Diminished to auscultation bilaterally with some coarse breath sounds and some slight crackles or rhonchi. Normal respiratory effort and patient is not tachypenic. No accessory muscle use.  Wearing supplemental oxygen  nasal cannula Cardiovascular: RRR, no murmurs / rubs / gallops. S1 and S2 auscultated.  1+ extremity edema Abdomen: Soft, non-tender, distended secondary to body habitus. Bowel sounds positive.  GU: Deferred. Musculoskeletal: No clubbing / cyanosis of digits/nails. No joint deformity upper and lower extremities.  Skin: No rashes, lesions, ulcers on limited skin evaluation. No induration; Warm and dry.  Neurologic: CN 2-12 grossly intact with no focal deficits she does disconjugate gaze. Romberg sign cerebellar reflexes not assessed.  Psychiatric: Normal judgment and insight. Alert and oriented x 3. Normal mood and appropriate affect.   Data Reviewed: I have  personally reviewed following labs and imaging studies  CBC: Recent Labs  Lab 08/25/23 0315 08/25/23 1349 08/27/23 0355 08/28/23 0345 08/29/23 0549 08/30/23 0500 08/31/23 1009  WBC 10.9*   < > 9.3 8.7 7.2 7.1 7.3  NEUTROABS 9.2*  --   --   --   --   --  5.2  HGB 11.3*   < > 10.0* 9.9* 9.9* 10.2* 10.7*  HCT 39.4   < > 32.9* 32.1* 31.5* 33.1* 35.6*  MCV 101.8*   < > 97.1 95.5 92.9 95.4 96.5  PLT 109*   < > 136* 153 179 221 278   < > = values in this interval not displayed.   Basic Metabolic Panel: Recent Labs  Lab 08/25/23 0909 08/25/23 1349 08/26/23 0402 08/27/23 0355 08/28/23 0345 08/29/23 0549 08/30/23 0500 08/31/23 1009  NA 146*   < > 146* 146* 144 146* 144 141  K 4.6   < > 3.8 3.5 3.4* 3.2* 4.1 4.1  CL 95*  --  95* 94* 94* 98 101 96*  CO2 42*  --  43* 43* 40* 38* 38* 37*  GLUCOSE 105*  --  94 94 104* 106* 98 119*  BUN 8  --  12 8 10 15 15 16   CREATININE 0.49  --  0.61 0.52 0.59 0.60 0.60 0.55  CALCIUM  10.0  --  9.3 9.1 9.2 9.1 9.1 9.1  MG 2.0  --  1.9 1.7 1.7  --   --  2.3  PHOS 3.1  --  1.3* 2.4* 2.7  --   --  3.4   < > = values in this interval not displayed.   GFR: Estimated Creatinine Clearance: 92.9 mL/min (by C-G formula based on SCr of 0.55 mg/dL). Liver Function Tests: Recent Labs  Lab 08/25/23 0909 08/31/23 1009  AST 18 33  ALT 12 25  ALKPHOS 51 39  BILITOT 1.0 0.7  PROT 7.1 7.4  ALBUMIN 2.9* 2.6*   No results for input(s): LIPASE, AMYLASE in the last 168 hours. Recent Labs  Lab 08/25/23 0909  AMMONIA 65*   Coagulation Profile: No results for input(s): INR, PROTIME in the last 168 hours. Cardiac Enzymes: No results for input(s): CKTOTAL, CKMB, CKMBINDEX, TROPONINI in the last 168 hours. BNP (last 3 results) No results for input(s): PROBNP in the last 8760 hours. HbA1C: No results for input(s): HGBA1C in the last 72 hours. CBG: Recent Labs  Lab 08/30/23 1515 08/30/23 1645 08/31/23 0827 08/31/23 1219  08/31/23 1632  GLUCAP 124* 103* 106* 91 106*   Lipid Profile:  Recent Labs    08/29/23 0549  TRIG 121   Thyroid  Function Tests: No results for input(s): TSH, T4TOTAL, FREET4, T3FREE, THYROIDAB in the last 72 hours. Anemia Panel: No results for input(s): VITAMINB12, FOLATE, FERRITIN, TIBC, IRON, RETICCTPCT in the last 72 hours. Sepsis Labs: No results for input(s): PROCALCITON, LATICACIDVEN in the last 168 hours.  Recent Results (from the past 240 hours)  MRSA Next Gen by PCR, Nasal     Status: None   Collection Time: 08/25/23 12:04 PM   Specimen: Nasal Mucosa; Nasal Swab  Result Value Ref Range Status   MRSA by PCR Next Gen NOT DETECTED NOT DETECTED Final    Comment: (NOTE) The GeneXpert MRSA Assay (FDA approved for NASAL specimens only), is one component of a comprehensive MRSA colonization surveillance program. It is not intended to diagnose MRSA infection nor to guide or monitor treatment for MRSA infections. Test performance is not FDA approved in patients less than 52 years old. Performed at [facility], [address], [place], [place] [address]     Radiology Studies: No results found.  Scheduled Meds:  [START ON 09/01/2023] albuterol   2.5 mg Nebulization BID   ARIPiprazole   2 mg Oral Daily   budesonide  (PULMICORT ) nebulizer solution  0.5 mg Nebulization BID   capsaicin    Topical BID   Chlorhexidine  Gluconate Cloth  6 each Topical Daily   feeding supplement (PROSource TF20)  60 mL Per Tube Daily   free water   200 mL Per Tube Q6H   levothyroxine   200 mcg Oral QAC breakfast   [START ON 09/01/2023] losartan   50 mg Oral Daily   montelukast   10 mg Per Tube QHS   mupirocin  cream   Topical BID   mycophenolate   1,000 mg Oral BID   mouth rinse  15 mL Mouth Rinse 4 times per day   pantoprazole  (PROTONIX ) IV  40 mg Intravenous Daily   polyethylene glycol  17 g Oral Daily   pregabalin   200 mg Oral BID   pyridostigmine   30 mg Oral 5 X Daily    sodium chloride  flush  10-40 mL Intracatheter Q12H   sodium chloride  flush  3 mL Intravenous Q6H   thiamine   100 mg Oral Daily   Continuous Infusions:  feeding supplement (OSMOLITE 1.5 CAL) 55 mL/hr at 08/31/23 1614    LOS: 13 days   Alejandro Marker, DO [facility] Available via Epic secure chat 7am-7pm After these hours, please refer to coverage provider listed on [email or web address] 08/31/2023, 5:57 PM

## 2023-08-31 NOTE — Progress Notes (Addendum)
 NEUROLOGY CONSULT FOLLOW UP NOTE   Date of service: August 31, 2023 Patient Name: [name] MRN:  [id number] DOB:  1965-05-14  Interval Hx/subjective  Pt seen bedside this morning with sister bedside as well. She states that she feels like her strength is similar to yesterday, and hasn't been able to get out of bed. She feels closer to her baseline. She does state that she is having some neuropathic pain in her legs bilaterally. She has had myasthenia gravis since 1990.  Vitals   Vitals:   08/31/23 0110 08/31/23 0240 08/31/23 0249 08/31/23 0747  BP: (!) 112/52   (!) 110/51  Pulse: 65 68 68 74  Resp:  20 20 (!) 24  Temp: 97.6 F (36.4 C)   (!) 97 F (36.1 C)  TempSrc:    Axillary  SpO2: 95% 98% 97% 93%  Weight:      Height:         Body mass index is 53.3 kg/m.  Physical Exam   Constitutional: Appears well-developed and well-nourished.  Psych: Affect appropriate to situation.  Respiratory: Effort normal, non-labored breathing   Neurologic Examination   Neuro: *Ment: A&O x4. Follows multi-step commands.  *Speech: no dysarthria or aphasia *CN:    I: Deferred   II,III: PERRL, VFF by confrontation. Ptosis present in right eye, with exotropia    III,IV,VI: EOM with exotropia. Right eye laterally and slightly supraducted relative to the left. No nystagmus   V: Sensation intact from V1 to V3 to fine touch   VII: Eyelid closure was full bilaterally, but weak.  Smile symmetric.   VIII: Hearing intact to voice   IX,X: Voice normal, palate elevates symmetrically    XI: Weak neck flexion 4/5  XII: Tongue protrudes midline, no atrophy or fasciculations  *Motor:   Normal bulk.  No tremor, rigidity or bradykinesia. No pronator drift.  Strength: Dlt Bic Tri WE WrF FgS Gr HF KnF KnE PlF DoF    Left 4 4 4 4 4 4 4 3 4 4 4 4     Right 4 4 4 4 4 4 4 3 4 4 4 4   Sensory: Intact to FT x 4 proximally. Decreased sensation to distal BLE (history of neuropathy) Cerebellar: No ataxia  with FNF bilaterally Gait: deferred     Medications  Current Facility-Administered Medications:    acetaminophen  (TYLENOL ) tablet 650 mg, 650 mg, Oral, Q6H PRN, 650 mg at 08/30/23 2227 **OR** acetaminophen  (TYLENOL ) suppository 650 mg, 650 mg, Rectal, Q6H PRN, Olalere, Adewale A, MD   albuterol  (PROVENTIL ) (2.5 MG/3ML) 0.083% nebulizer solution 2.5 mg, 2.5 mg, Nebulization, Q6H PRN, Tobie, Vishal R, MD   albuterol  (PROVENTIL ) (2.5 MG/3ML) 0.083% nebulizer solution 2.5 mg, 2.5 mg, Nebulization, Q6H, Pawar, Rahul, MD, 2.5 mg at 08/31/23 0733   ARIPiprazole  (ABILIFY ) tablet 2 mg, 2 mg, Oral, Daily, Olalere, Adewale A, MD, 2 mg at 08/31/23 0829   budesonide  (PULMICORT ) nebulizer solution 0.5 mg, 0.5 mg, Nebulization, BID, Desai, Rahul P, PA-C, 0.5 mg at 08/31/23 0732   capsaicin  (ZOSTRIX) 0.025 % cream, , Topical, BID, Nooruddin, Saad, MD   Chlorhexidine  Gluconate Cloth 2 % PADS 6 each, 6 each, Topical, Daily, Sherrill Cable Vansant, DO, 6 each at 08/31/23 9166   feeding supplement (OSMOLITE 1.5 CAL) liquid 660 mL, 660 mL, Per Tube, Continuous, Olalere, Adewale A, MD, Last Rate: 55 mL/hr at 08/30/23 2227, 660 mL at 08/30/23 2227   feeding supplement (PROSource TF20) liquid 60 mL, 60 mL, Per Tube, Daily, Pawar,  Rahul, MD, 60 mL at 08/31/23 0829   free water  200 mL, 200 mL, Per Tube, Q6H, Jenna Maude BRAVO, NP, 200 mL at 08/31/23 0604   hydrALAZINE  (APRESOLINE ) injection 10 mg, 10 mg, Intravenous, Q6H PRN, Rashid, Farhan, MD   levothyroxine  (SYNTHROID ) tablet 200 mcg, 200 mcg, Oral, QAC breakfast, Olalere, Adewale A, MD, 200 mcg at 08/31/23 0831   lip balm (CARMEX) ointment, , Topical, PRN, Byrum, Robert S, MD   montelukast  (SINGULAIR ) tablet 10 mg, 10 mg, Per Tube, QHS, Desai, Rahul P, PA-C, 10 mg at 08/30/23 2148   mupirocin  cream (BACTROBAN ) 2 %, , Topical, BID, Dino Antu, MD, Given at 08/31/23 9166   mycophenolate  (CELLCEPT ) capsule 1,000 mg, 1,000 mg, Oral, BID, Olalere, Adewale A, MD, 1,000  mg at 08/31/23 9167   ondansetron  (ZOFRAN ) tablet 4 mg, 4 mg, Oral, Q6H PRN **OR** ondansetron  (ZOFRAN ) injection 4 mg, 4 mg, Intravenous, Q6H PRN, Olalere, Adewale A, MD   Oral care mouth rinse, 15 mL, Mouth Rinse, 4 times per day, Shelah Lamar RAMAN, MD, 15 mL at 08/31/23 9167   Oral care mouth rinse, 15 mL, Mouth Rinse, PRN, Shelah Lamar RAMAN, MD   pantoprazole  (PROTONIX ) injection 40 mg, 40 mg, Intravenous, Daily, Desai, Rahul P, PA-C, 40 mg at 08/31/23 0831   polyethylene glycol (MIRALAX  / GLYCOLAX ) packet 17 g, 17 g, Oral, Daily, Olalere, Adewale A, MD, 17 g at 08/31/23 0831   pregabalin  (LYRICA ) capsule 200 mg, 200 mg, Oral, BID, Rosan Deward ORN, NP, 200 mg at 08/31/23 0830   pyridostigmine  (MESTINON ) tablet 30 mg, 30 mg, Oral, 5 X Daily, Olalere, Adewale A, MD, 30 mg at 08/31/23 0830   sodium chloride  flush (NS) 0.9 % injection 10-40 mL, 10-40 mL, Intracatheter, Q12H, Ramaswamy, Dorethia, MD, 10 mL at 08/31/23 0832   sodium chloride  flush (NS) 0.9 % injection 10-40 mL, 10-40 mL, Intracatheter, PRN, Geronimo Dorethia, MD   sodium chloride  flush (NS) 0.9 % injection 3 mL, 3 mL, Intravenous, Q6H, Pawar, Rahul, MD, 3 mL at 08/31/23 9167   thiamine  (VITAMIN B1) tablet 100 mg, 100 mg, Oral, Daily, Olalere, Adewale A, MD, 100 mg at 08/31/23 0831  Labs and Diagnostic Imaging   CBC:  Recent Labs  Lab 08/25/23 0315 08/25/23 1349 08/29/23 0549 08/30/23 0500  WBC 10.9*   < > 7.2 7.1  NEUTROABS 9.2*  --   --   --   HGB 11.3*   < > 9.9* 10.2*  HCT 39.4   < > 31.5* 33.1*  MCV 101.8*   < > 92.9 95.4  PLT 109*   < > 179 221   < > = values in this interval not displayed.    Basic Metabolic Panel:  Lab Results  Component Value Date   NA 144 08/30/2023   K 4.1 08/30/2023   CO2 38 (H) 08/30/2023   GLUCOSE 98 08/30/2023   BUN 15 08/30/2023   CREATININE 0.60 08/30/2023   CALCIUM  9.1 08/30/2023   GFRNONAA >60 08/30/2023   GFRAA >60 12/21/2018   HgbA1c:  Lab Results  Component Value Date    HGBA1C 6.0 (H) 08/16/2016   INR  Lab Results  Component Value Date   INR 1.2 08/19/2023    Assessment  [name] is a 58 y.o. female with a PMHx of [place] negative Myathenia Gravis, moderate persistent asthma, OSA on CPAP, chronic hypoxic and hypercarbic resp failure on 2L [place] PTA, admitted for acute on chronic respiratory failure in the setting of sepsis from  bilateral lower lobe pneumonia. Hospital course was complicated by splenic laceration and subcapsular hematoma. Pt became very weak and there was concern for MG crisis for which she was intubated and started on IVIG. Her last dose was on Sunday.   Recommendations   Myasthenia Gravis Crisis, now resolved  Pt has completed a 5 day course of IVIG for a total of 5 days. Overall appears well, and she feels like she is closer to her baseline. Her strength is still not at her baseline, and this may be her new baseline if she has lost muscle bulk due decreased motor activity secondary to weakness during this stay. Discussed with her the options of more treatment at this time with PLEX, however pt has opted for conservative route with focus on strengthening with physical therapy and occupational therapy. Re-emphasized the importance of getting out of bed every day, and sitting in a chair. She is complaining of some bilateral neuropathic pain in her lower extremities. Will trial capsaicin  cream  - Overall clinical course has improved, will need to establish with outpatient neurology here in [facility]  - Continue cellcept  and mestinon  - Offered PLEX treatment however pt would like to trial PT/OT instead for further recovery. Will respect wishes  - Daily NIF and FVC with RT - Continue PT/OT - Will be avaialble in case pt does not improve further or changes her mind on PLEX therapy   ______________________________________________________________________   Bonney Roetta Chars, MD Internal Medicine Resident, PGY-3  Electronically  signed: Dr. Emberlee Sortino

## 2023-08-31 NOTE — Care Management Important Message (Signed)
 Important Message  Patient Details  Name: [name] MRN: [id number] Date of Birth: [birth year]   Important Message Given:  Yes - Medicare IM     Claretta Deed 08/31/2023, 4:20 PM

## 2023-08-31 NOTE — TOC Progression Note (Signed)
 Transition of Care [facility]) - Progression Note    Patient Details  Name: STOREY STANGELAND MRN: [id number] Date of Birth: 01/24/1965  Transition of Care [facility]) CM/SW Contact  Veronika Heard A [place], LCSW Phone Number: 08/31/2023, 1:12 PM  Clinical Narrative:     CSW met with pt and pt's mother and family member at bedside. Provided bed offers with Medicare.gov ratings, explained the SNF process and CSW role in assisting with DC process. She said that she would get back to CSW with decision, requested her sister Dickey be the other point of contact for pt. Pt unable to DC to facility with [facility], will start authorization process closer to pt's medical stability and facility choice.   CSW will continue to follow.   Expected Discharge Plan: Home w Home Health Services Barriers to Discharge: Continued Medical Work up               Expected Discharge Plan and Services In-house Referral: Clinical Social Work                                             Social Drivers of Health (SDOH) Interventions SDOH Screenings   Food Insecurity: No Food Insecurity (08/19/2023)  Housing: Low Risk  (08/19/2023)  Transportation Needs: No Transportation Needs (08/19/2023)  Utilities: Not At Risk (08/19/2023)  Tobacco Use: Medium Risk (08/18/2023)  Health Literacy: High Risk (04/27/2020)   Received from [facility]    Readmission Risk Interventions     No data to display

## 2023-09-01 DIAGNOSIS — M25561 Pain in right knee: Secondary | ICD-10-CM | POA: Diagnosis not present

## 2023-09-01 DIAGNOSIS — G7000 Myasthenia gravis without (acute) exacerbation: Secondary | ICD-10-CM | POA: Diagnosis not present

## 2023-09-01 DIAGNOSIS — J189 Pneumonia, unspecified organism: Secondary | ICD-10-CM | POA: Diagnosis not present

## 2023-09-01 LAB — GLUCOSE, CAPILLARY
Glucose-Capillary: 89 mg/dL (ref 70–99)
Glucose-Capillary: 109 mg/dL — ABNORMAL HIGH (ref 70–99)
Glucose-Capillary: 140 mg/dL — ABNORMAL HIGH (ref 70–99)
Glucose-Capillary: 100 mg/dL — ABNORMAL HIGH (ref 70–99)
Glucose-Capillary: 120 mg/dL — ABNORMAL HIGH (ref 70–99)

## 2023-09-01 MED ORDER — PANTOPRAZOLE SODIUM 40 MG PO TBEC
40.0000 mg | DELAYED_RELEASE_TABLET | Freq: Every day | ORAL | Status: DC
  Administered 2023-09-01 – 2023-09-07 (×8): 40 mg via ORAL
  Filled 2023-09-01 (×7): qty 1

## 2023-09-01 MED ORDER — MONTELUKAST SODIUM 10 MG PO TABS
10.0000 mg | ORAL_TABLET | Freq: Every day | ORAL | Status: DC
  Administered 2023-09-01 – 2023-09-06 (×7): 10 mg via ORAL
  Filled 2023-09-01 (×6): qty 1

## 2023-09-01 NOTE — Progress Notes (Signed)
   09/01/23 2141  BiPAP/CPAP/SIPAP  $ Non-Invasive Ventilator  Non-Invasive Vent Subsequent  BiPAP/CPAP/SIPAP Pt Type Adult  BiPAP/CPAP/SIPAP SERVO  Mask Type Full face mask  Set Rate 12 breaths/min  Respiratory Rate 30 breaths/min  IPAP 16 cmH20  EPAP 6 cmH2O  Pressure Support 10 cmH20  PEEP 6 cmH20  FiO2 (%) 40 %  Minute Ventilation 14.4  Leak 30  Peak Inspiratory Pressure (PIP) 16  Tidal Volume (Vt) 467  Patient Home Machine No  Patient Home Mask No  Patient Home Tubing No  Auto Titrate No  Press High Alarm 30 cmH2O  Device Plugged into RED Power Outlet Yes  Oxygen  Percent 40 %

## 2023-09-01 NOTE — TOC Progression Note (Signed)
 Transition of Care [facility]) - Progression Note    Patient Details  Name: [name] MRN: [id number] Date of Birth: 1966/01/10  Transition of Care [facility]) CM/SW Contact  Marvelous Woolford A [place], LCSW Phone Number: 09/01/2023, 2:12 PM  Clinical Narrative:     CSW spoke with pt's sister Dickey about bed offers. She said that [facility] was on pt's offer list previously, CSW informed her that they are now considering. CSW to reach out to [facility] and update on pt's progress and request reconsideration.   Requested [facility] re-review pt as well.   CSW to follow up with Dickey regarding update on information on SNF facilities.   CSW will continue to follow.   Expected Discharge Plan: Home w Home Health Services Barriers to Discharge: Continued Medical Work up               Expected Discharge Plan and Services In-house Referral: Clinical Social Work                                             Social Drivers of Health (SDOH) Interventions SDOH Screenings   Food Insecurity: No Food Insecurity (08/19/2023)  Housing: Low Risk  (08/19/2023)  Transportation Needs: No Transportation Needs (08/19/2023)  Utilities: Not At Risk (08/19/2023)  Tobacco Use: Medium Risk (08/18/2023)  Health Literacy: High Risk (04/27/2020)   Received from [facility]    Readmission Risk Interventions     No data to display

## 2023-09-01 NOTE — Progress Notes (Addendum)
 Physical Therapy Treatment Patient Details Name: [name] MRN: [id number] DOB: [birth year] Today's Date: 09/01/2023   History of Present Illness [name] is a 58 y.o. female who presented with worsening SOB. Found to have PNA and CTA 7/30 w/ apparent splenic lacerations in the midpole region with surrounding subcapsular hematoma. No free fluid in the abdomen or pelvis. No active extravasation of contrast. Worsening respiratory status on 08/25/23, transferred to ICU and intubated. PMHx: obesity, depression, asthma, OSA, myasthenia gravis, chronic hypoxic hypercarbic respiratory failure on 2 L O2, remote history of PE (not on anticoagulation), HTN, hypothyroidism    PT Comments  Pt in bed at start of session and agreeable to therapy. Pt progressing mobility as she requires less physical assist using the bed functions for bed mobility and less assist to stand to RW. Pt is limited by fatigue for gait but able to walk short room distance with chair follow and CGA. Pt states that she would have 24 hour/day assistance post-discharge. Patient will benefit from intensive inpatient follow-up therapy, >3 hours/day to progress functional mobility and activity tolerance. PT to follow acutely.     If plan is discharge home, recommend the following: Assistance with cooking/housework;Help with stairs or ramp for entrance;Assist for transportation;A lot of help with walking and/or transfers;A little help with bathing/dressing/bathroom   Can travel by private vehicle     No  Equipment Recommendations  Wheelchair cushion (measurements PT);Wheelchair (measurements PT);Hospital bed;Rolling walker (2 wheels)    Recommendations for Other Services       Precautions / Restrictions Precautions Precautions: Fall Recall of Precautions/Restrictions: Intact Restrictions Weight Bearing Restrictions Per Provider Order: No     Mobility  Bed Mobility Overal bed mobility: Independent Bed Mobility:  Supine to Sit     Supine to sit: HOB elevated, Used rails, Min assist     General bed mobility comments: Increased time and assist with LE management schooting to L EOB. Max cueing for sequencing.    Transfers Overall transfer level: Needs assistance Equipment used: Rolling walker (2 wheels) Transfers: Sit to/from Stand, Bed to chair/wheelchair/BSC Sit to Stand: +2 physical assistance, Mod assist, Min assist, From elevated surface   Step pivot transfers: Mod assist       General transfer comment: Performed 2 sit to stands (1 from elevated EOB and 1 from recliner). ModA for elevated EOB. +2 MinA from recliner    Ambulation/Gait Ambulation/Gait assistance: Contact guard assist Gait Distance (Feet): 12 Feet Assistive device: Rolling walker (2 wheels) Gait Pattern/deviations: Step-to pattern, Decreased stride length, Shuffle, Trunk flexed Gait velocity: Decreased     General Gait Details: Dizziness with walking as pt states she was holding her breath. Cues to continue breathing even with strenuous activity   Stairs             Wheelchair Mobility     Tilt Bed    Modified Rankin (Stroke Patients Only)       Balance Overall balance assessment: Needs assistance Sitting-balance support: Feet supported Sitting balance-Leahy Scale: Fair Sitting balance - Comments: Able to sit EOB w/ supervision and no LOB   Standing balance support: Bilateral upper extremity supported, During functional activity, Reliant on assistive device for balance Standing balance-Leahy Scale: Fair Standing balance comment: Uses RW for support and gait                            Communication Communication Communication: No apparent difficulties  Cognition Arousal: Alert  Behavior During Therapy: WFL for tasks assessed/performed   PT - Cognitive impairments: Sequencing, Problem solving                         Following commands: Intact      Cueing Cueing  Techniques: Verbal cues, Gestural cues, Tactile cues  Exercises General Exercises - Lower Extremity Ankle Circles/Pumps: AROM, Both, 10 reps, Seated Long Arc Quad: AROM, Both, Seated, 10 reps Hip ABduction/ADduction: AROM, 10 reps, Both, Seated Hip Flexion/Marching: AROM, 10 reps, Both, Seated    General Comments General comments (skin integrity, edema, etc.): VSS on 3L O2. SpO2 >92% with ambulation      Pertinent Vitals/Pain Pain Assessment Pain Assessment: Faces Faces Pain Scale: No hurt    Home Living                          Prior Function            PT Goals (current goals can now be found in the care plan section) Acute Rehab PT Goals Patient Stated Goal: to improve mobility PT Goal Formulation: With patient Time For Goal Achievement: 09/09/23 Potential to Achieve Goals: Good Progress towards PT goals: Progressing toward goals    Frequency    Min 2X/week      PT Plan      Co-evaluation              AM-PAC PT 6 Clicks Mobility   Outcome Measure  Help needed turning from your back to your side while in a flat bed without using bedrails?: A Lot Help needed moving from lying on your back to sitting on the side of a flat bed without using bedrails?: A Lot Help needed moving to and from a bed to a chair (including a wheelchair)?: A Lot Help needed standing up from a chair using your arms (e.g., wheelchair or bedside chair)?: A Lot Help needed to walk in hospital room?: A Lot Help needed climbing 3-5 steps with a railing? : Total 6 Click Score: 11    End of Session Equipment Utilized During Treatment: Gait belt;Oxygen  Activity Tolerance: Patient limited by fatigue Patient left: with call bell/phone within reach;with chair alarm set;in chair Nurse Communication: Mobility status PT Visit Diagnosis: Other abnormalities of gait and mobility (R26.89);Muscle weakness (generalized) (M62.81)     Time: 9050-8982 PT Time Calculation (min) (ACUTE  ONLY): 28 min  Charges:    $Gait Training: 8-22 mins $Therapeutic Exercise: 8-22 mins PT General Charges $$ ACUTE PT VISIT: 1 Visit                     [name], SPT  Acute Rehab  [phone]    [name] 09/01/2023, 2:39 PM

## 2023-09-01 NOTE — Plan of Care (Signed)

## 2023-09-01 NOTE — Plan of Care (Signed)
  Problem: Education: Goal: Knowledge of General Education information will improve Description: Including pain rating scale, medication(s)/side effects and non-pharmacologic comfort measures 09/01/2023 0303 by Evern Monica HERO, RN Outcome: Progressing 08/31/2023 2020 by Evern Monica HERO, RN Outcome: Progressing   Problem: Health Behavior/Discharge Planning: Goal: Ability to manage health-related needs will improve 09/01/2023 0303 by Evern Monica HERO, RN Outcome: Progressing 08/31/2023 2020 by Evern Monica HERO, RN Outcome: Progressing   Problem: Clinical Measurements: Goal: Ability to maintain clinical measurements within normal limits will improve 09/01/2023 0303 by Evern Monica HERO, RN Outcome: Progressing 08/31/2023 2020 by Evern Monica HERO, RN Outcome: Progressing Goal: Will remain free from infection 09/01/2023 0303 by Evern Monica HERO, RN Outcome: Progressing 08/31/2023 2020 by Evern Monica HERO, RN Outcome: Progressing Goal: Diagnostic test results will improve 09/01/2023 0303 by Evern Monica HERO, RN Outcome: Progressing 08/31/2023 2020 by Evern Monica HERO, RN Outcome: Progressing Goal: Respiratory complications will improve 09/01/2023 0303 by Evern Monica HERO, RN Outcome: Progressing 08/31/2023 2020 by Evern Monica HERO, RN Outcome: Progressing Goal: Cardiovascular complication will be avoided 09/01/2023 0303 by Evern Monica HERO, RN Outcome: Progressing 08/31/2023 2020 by Evern Monica HERO, RN Outcome: Progressing   Problem: Activity: Goal: Risk for activity intolerance will decrease 09/01/2023 0303 by Evern Monica HERO, RN Outcome: Progressing 08/31/2023 2020 by Evern Monica HERO, RN Outcome: Progressing   Problem: Nutrition: Goal: Adequate nutrition will be maintained 09/01/2023 0303 by Evern Monica HERO, RN Outcome: Progressing 08/31/2023 2020 by Evern Monica HERO, RN Outcome: Progressing   Problem: Coping: Goal: Level of anxiety will decrease 09/01/2023  0303 by Evern Monica HERO, RN Outcome: Progressing 08/31/2023 2020 by Evern Monica HERO, RN Outcome: Progressing   Problem: Elimination: Goal: Will not experience complications related to bowel motility 09/01/2023 0303 by Evern Monica HERO, RN Outcome: Progressing 08/31/2023 2020 by Evern Monica HERO, RN Outcome: Progressing Goal: Will not experience complications related to urinary retention 09/01/2023 0303 by Evern Monica HERO, RN Outcome: Progressing 08/31/2023 2020 by Evern Monica HERO, RN Outcome: Progressing   Problem: Pain Managment: Goal: General experience of comfort will improve and/or be controlled 09/01/2023 0303 by Evern Monica HERO, RN Outcome: Progressing 08/31/2023 2020 by Evern Monica HERO, RN Outcome: Progressing   Problem: Safety: Goal: Ability to remain free from injury will improve 09/01/2023 0303 by Evern Monica HERO, RN Outcome: Progressing 08/31/2023 2020 by Evern Monica HERO, RN Outcome: Progressing   Problem: Skin Integrity: Goal: Risk for impaired skin integrity will decrease 09/01/2023 0303 by Evern Monica HERO, RN Outcome: Progressing 08/31/2023 2020 by Evern Monica HERO, RN Outcome: Progressing   Problem: Activity: Goal: Ability to tolerate increased activity will improve 09/01/2023 0303 by Evern Monica HERO, RN Outcome: Progressing 08/31/2023 2020 by Evern Monica HERO, RN Outcome: Progressing   Problem: Respiratory: Goal: Ability to maintain a clear airway and adequate ventilation will improve 09/01/2023 0303 by Evern Monica HERO, RN Outcome: Progressing 08/31/2023 2020 by Evern Monica HERO, RN Outcome: Progressing   Problem: Role Relationship: Goal: Method of communication will improve 09/01/2023 0303 by Evern Monica HERO, RN Outcome: Progressing 08/31/2023 2020 by Evern Monica HERO, RN Outcome: Progressing

## 2023-09-01 NOTE — Progress Notes (Signed)
Patient refused CHG Bath.

## 2023-09-01 NOTE — Progress Notes (Signed)
  Inpatient Rehabilitation Admissions Coordinator   Met with patient, her Mom and 2 sisters at bedside as well as spoke on conference call with her sister, Betty,for rehab assessment. We discussed goals and expectations of a possible CIR admit. They prefer CIR for rehab. Prior to admit patient was able to walk short distances in the home, sponge bathe seated on toilet, but did have increasing pain in her feet. Family can provide expected caregiver support when sister [name] is at work.I will ask Rehab MD to consult tomorrow and follow up with possible Cir admit.  Please call me with any questions.   Heron Leavell, RN, MSN Rehab Admissions Coordinator [phone]

## 2023-09-01 NOTE — PMR Pre-admission (Signed)
 PMR Admission Coordinator Pre-Admission Assessment  Patient: [name] is an 58 y.o., female MRN: [id number] DOB: May 20, 1965 Height: 5' (152.4 cm) Weight: 129 kg              Insurance Information HMO: yes    PPO:      PCP:      IPA:      80/20:      OTHER:  PRIMARY: [employer] HMO      Policy#: [id number]      Subscriber: pt CM Name: Serene Solian      Phone#: [phone]     Fax#: [phone] Pre-Cert#: [id number] approved for 7 days 8/19 until 8/26  f/u with Norvel Maiden phone [phone] ext [phone] fax [phone]    Employer:  Benefits:  Phone #: [phone]     Name: 8/14 Eff. Date: 01/20/23     Deduct: none      Out of Pocket Max: $9350      Life Max: none  CIR: $2185 co pay per admit      SNF: no co pay  days 1 until 20; $214 co pay per day days 21 until 100 Outpatient: 80%     Co-Pay: 20% Home Health: 100%      Co-Pay:  DME: 80%     Co-Pay: 20% Providers: In network  SECONDARY: Medicaid of [place]      Policy#: [id number]      Phone#: [phone] Verified on passport one source on 8/13 MADQY  Financial Counselor:       Phone#:   The "Data Collection Information Summary" for patients in Inpatient Rehabilitation Facilities with attached "Privacy Act Statement-Health Care Records" was provided and verbally reviewed with: Patient and Family  Emergency Contact Information Contact Information     Name Relation Home Work Mobile   Seagraves Sister   [phone]   Wright,Betty Sister   [phone]      Other Contacts   None on File    Current Medical History  Patient Admitting Diagnosis: Myasthenia Gravis  History of Present Illness: 58 yo female with history of myasthenia gravis, moderate persistent asthma, chronic hypoxic and hypercarbic respiratory failure on O2 at 2 liters [place] at baseline, history of PE no longer on anticoagulation, HTN, hypothyroidism, depression, morbid obesity , OSA on CPAP who presented to ED on 08/18/23 for shortness of breath.    Work up with CT chest as well as CT abdomen and pelvis. CT abdomen demonstrated equivocal findings for possible splenic laceration versus heterogeneous enhancement. Surgery consulted and opted for conservative management. She developed change in mental status on 8/6 with increased weakness, increased secretions an inability to swallow. Worsening hypoxic and hypercapnic respiratory failure. PCCM consulted. Had not worn her CPAP overnight before. Placed on BIPAP and to ICU due to concerns for worsening bulbar symptoms and impending respiratory failure. Neurology consulted and opted to begin IVIG x 5 days and intubated on 8/6.  Cortrak placed. Extubated on 8/10.   On Cellcept  and Mestinon . Plan BIPAP as needed and at HS. Neurology offered PLEX on 8/12 but patient requesting PT and OT at this time. Will plan OP follow up in [facility] clinic for she has not been to [facility] clinic since 2023, to see either Dr Tonita Blanch or Dr Venetia Potters. Switched to CPAP rather than Bipap at HS. Cortrak removed.   Patient[facility] has been reviewed by the rehabilitation admission coordinator and physician.  Past Medical History  Past Medical  History:  Diagnosis Date   Asthma    Depression    Heart murmur    Myasthenia gravis (HCC)    Obesity    Pulmonary embolism (HCC)    Sleep apnea    Thyroid  disease    Has the patient had major surgery during 100 days prior to admission? No  Family History  family history is not on file.  Current Medications   Current Facility-Administered Medications:    acetaminophen  (TYLENOL ) tablet 650 mg, 650 mg, Oral, Q6H PRN, 650 mg at 09/03/23 1747 **OR** acetaminophen  (TYLENOL ) suppository 650 mg, 650 mg, Rectal, Q6H PRN, Olalere, Adewale A, MD   albuterol  (PROVENTIL ) (2.5 MG/3ML) 0.083% nebulizer solution 2.5 mg, 2.5 mg, Nebulization, Q6H PRN, Patel, Vishal R, MD, 2.5 mg at 09/03/23 1619   albuterol  (PROVENTIL ) (2.5 MG/3ML) 0.083% nebulizer  solution 2.5 mg, 2.5 mg, Nebulization, BID, Sheikh, Omair Latif, DO, 2.5 mg at 09/07/23 9277   ARIPiprazole  (ABILIFY ) tablet 2 mg, 2 mg, Oral, Daily, Olalere, Adewale A, MD, 2 mg at 09/07/23 0815   budesonide  (PULMICORT ) nebulizer solution 0.5 mg, 0.5 mg, Nebulization, BID, Desai, Rahul P, PA-C, 0.5 mg at 09/07/23 9277   capsaicin  (ZOSTRIX) 0.025 % cream, , Topical, BID, Nooruddin, Saad, MD, Given at 09/07/23 0959   Chlorhexidine  Gluconate Cloth 2 % PADS 6 each, 6 each, Topical, Daily, Sheikh, Omair Latif, DO, 6 each at 09/07/23 1000   diclofenac  Sodium (VOLTAREN ) 1 % topical gel 4 g, 4 g, Topical, QID, Briana Elgin LABOR, MD, 4 g at 09/07/23 9040   hydrALAZINE  (APRESOLINE ) injection 10 mg, 10 mg, Intravenous, Q6H PRN, Rashid, Farhan, MD   levothyroxine  (SYNTHROID ) tablet 200 mcg, 200 mcg, Oral, QAC breakfast, Olalere, Adewale A, MD, 200 mcg at 09/07/23 0618   lip balm (CARMEX) ointment, , Topical, PRN, Byrum, Robert S, MD   montelukast  (SINGULAIR ) tablet 10 mg, 10 mg, Oral, QHS, Paytes, Austin A, RPH, 10 mg at 09/06/23 2105   mupirocin  cream (BACTROBAN ) 2 %, , Topical, BID, Rashid, Farhan, MD, Given at 09/07/23 1000   mycophenolate  (CELLCEPT ) capsule 1,000 mg, 1,000 mg, Oral, BID, Olalere, Adewale A, MD, 1,000 mg at 09/07/23 0816   ondansetron  (ZOFRAN ) tablet 4 mg, 4 mg, Oral, Q6H PRN **OR** ondansetron  (ZOFRAN ) injection 4 mg, 4 mg, Intravenous, Q6H PRN, Olalere, Adewale A, MD   Oral care mouth rinse, 15 mL, Mouth Rinse, 4 times per day, Shelah Lamar RAMAN, MD, 15 mL at 09/07/23 0816   Oral care mouth rinse, 15 mL, Mouth Rinse, PRN, Byrum, Robert S, MD   pantoprazole  (PROTONIX ) EC tablet 40 mg, 40 mg, Oral, Daily, Paytes, Austin A, RPH, 40 mg at 09/07/23 0815   polyethylene glycol (MIRALAX  / GLYCOLAX ) packet 17 g, 17 g, Oral, Daily, Olalere, Adewale A, MD, 17 g at 09/05/23 0955   pregabalin  (LYRICA ) capsule 200 mg, 200 mg, Oral, BID, Hoffman, Paul W, NP, 200 mg at 09/07/23 0815   pyridostigmine   (MESTINON ) tablet 30 mg, 30 mg, Oral, 5 X Daily, Olalere, Adewale A, MD, 30 mg at 09/07/23 0816   sodium chloride  flush (NS) 0.9 % injection 10-40 mL, 10-40 mL, Intracatheter, Q12H, Ramaswamy, Murali, MD, 10 mL at 09/07/23 0817   sodium chloride  flush (NS) 0.9 % injection 10-40 mL, 10-40 mL, Intracatheter, PRN, Geronimo Amel, MD   sodium chloride  flush (NS) 0.9 % injection 3 mL, 3 mL, Intravenous, Q6H, Pawar, Rahul, MD, 3 mL at 09/07/23 0817  Patients Current Diet:  Diet Order  Diet regular Fluid consistency: Thin  Diet effective now                  Precautions / Restrictions Precautions Precautions: Fall Precaution/Restrictions Comments: intubated Restrictions Weight Bearing Restrictions Per Provider Order: No   Has the patient had 2 or more falls or a fall with injury in the past year?No  Prior Activity Level Limited Community (1-2x/wk): Indepenent in home without AD, sisters assist with some adls in past few weeks. Sponge bathes at baseline. Painful feet since august 2024  Prior Functional Level Prior Function Prior Level of Function : Independent/Modified Independent Mobility Comments: indep, no AD, denies falls ADLs Comments: sister assists with IADLs  Self Care: Did the patient need help bathing, dressing, using the toilet or eating?  Independent  Indoor Mobility: Did the patient need assistance with walking from room to room (with or without device)? Independent  Stairs: Did the patient need assistance with internal or external stairs (with or without device)? Independent  Functional Cognition: Did the patient need help planning regular tasks such as shopping or remembering to take medications? Independent  Patient Information Are you of Hispanic, Latino/a,or Spanish origin?: A. No, not of Hispanic, Latino/a, or Spanish origin What is your race?: B. Black or African American Do you need or want an interpreter to communicate with a doctor or health  care staff?: 0. No  Patient's Response To:  Health Literacy and Transportation Is the patient able to respond to health literacy and transportation needs?: Yes Health Literacy - How often do you need to have someone help you when you read instructions, pamphlets, or other written material from your doctor or pharmacy?: Never In the past 12 months, has lack of transportation kept you from medical appointments or from getting medications?: No In the past 12 months, has lack of transportation kept you from meetings, work, or from getting things needed for daily living?: No  Home Assistive Devices / Equipment Home Equipment: Other (comment) (home O2)  Prior Device Use: Indicate devices/aids used by the patient prior to current illness, exacerbation or injury? None of the above  Current Functional Level Cognition  Orientation Level: Oriented X4    Extremity Assessment (includes Sensation/Coordination)  Upper Extremity Assessment: Defer to OT evaluation  Lower Extremity Assessment: RLE deficits/detail, LLE deficits/detail RLE Deficits / Details: grossly 3-/5 LLE Deficits / Details: grossly 3-/5    ADLs  Overall ADL's : Needs assistance/impaired Eating/Feeding: Set up, Sitting Grooming: Wash/dry hands, Wash/dry face, Oral care, Set up, Sitting Upper Body Bathing: Contact guard assist, Sitting Upper Body Bathing Details (indicate cue type and reason): on EOB Lower Body Bathing: Moderate assistance, Sit to/from stand Lower Body Bathing Details (indicate cue type and reason): assistance with peri area back Upper Body Dressing : Minimal assistance, Sitting Upper Body Dressing Details (indicate cue type and reason): gown Lower Body Dressing: Maximal assistance, Sitting/lateral leans Lower Body Dressing Details (indicate cue type and reason): socks Toilet Transfer: Total assistance, +2 for safety/equipment, +2 for physical assistance Toilet Transfer Details (indicate cue type and reason):  simulated. Able to STS with mod A +2, unable to progress to stepping Toileting- Clothing Manipulation and Hygiene: Total assistance, +2 for physical assistance, +2 for safety/equipment, Sit to/from stand Functional mobility during ADLs: Moderate assistance, +2 for physical assistance, +2 for safety/equipment General ADL Comments: focused on sitting EOB and sit to stands    Mobility  Overal bed mobility: Needs Assistance Bed Mobility: Supine to Sit Rolling: Mod assist Supine to  sit: HOB elevated, Used rails, Min assist Sit to supine: Max assist General bed mobility comments: not tested, pt received & left sitting in recliner    Transfers  Overall transfer level: Needs assistance Equipment used: Rolling walker (2 wheels) Transfers: Sit to/from Stand Sit to Stand: Supervision Bed to/from chair/wheelchair/BSC transfer type:: Step pivot Step pivot transfers: Min assist  Lateral/Scoot Transfers: Total assist General transfer comment: sit>stand from recliner with good ability to push to standing, poor eccentric control during stand>sit on 1st attempt but improved on 2nd attempt with cuing    Ambulation / Gait / Stairs / Wheelchair Mobility  Ambulation/Gait Ambulation/Gait assistance: Contact guard assist Gait Distance (Feet):  (30 + 11 ft) Assistive device: Rolling walker (2 wheels) Gait Pattern/deviations: Decreased step length - left, Decreased stride length, Decreased step length - right General Gait Details: R knee instability but no buckling, extra time when turning Gait velocity: variable but overall decreased Gait velocity interpretation: <1.31 ft/sec, indicative of household ambulator Pre-gait activities: Attempted to have pt take lateral steps toward [facility], pt unable to offload each LE to march or take shuffled steps, pt sits impulsively due to fatigue/BLE weakness.    Posture / Balance Dynamic Sitting Balance Sitting balance - Comments: EOB Balance Overall balance assessment:  Needs assistance Sitting-balance support: Feet supported Sitting balance-Leahy Scale: Fair Sitting balance - Comments: EOB Standing balance support: Bilateral upper extremity supported, During functional activity, Reliant on assistive device for balance Standing balance-Leahy Scale: Poor Standing balance comment: reliant on RW for support due to RLE knee pain    Special considerations/ Life events Bariatric Baseline home O2 at 2 liters House and CPAP at HS Needs OP setup for [place] [facility] clinic, has not been to [facility] since 2023    Previous Home Environment  Living Arrangements:  (Lives with sister, [name])  Lives With: Family (Sister, Engineer, drilling) Available Help at Discharge:  (26 yo Mom and mutiple sisters can assist at discharge) Type of Home: House Home Layout: One level Home Access: Stairs to enter Entrance Stairs-Rails: Right, Left (5 to 7 steps) Bathroom Shower/Tub: Engineer, manufacturing systems: Standard Bathroom Accessibility: Yes How Accessible: Accessible via walker Home Care Services: No Additional Comments: sister works 8-5; she has 6 other sisters near by  Discharge Living Setting Plans for Discharge Living Setting: Patient's home, Lives with (comment) (sister, [name]) Type of Home at Discharge: House Discharge Home Layout: One level Discharge Home Access: Stairs to enter Entrance Stairs-Rails: Right, Left Entrance Stairs-Number of Steps: 6 to 7 Discharge Bathroom Shower/Tub: Tub/shower unit Discharge Bathroom Toilet: Standard Discharge Bathroom Accessibility: Yes How Accessible: Accessible via walker Does the patient have any problems obtaining your medications?: No  Social/Family/Support Systems Contact Information: sisters Haematologist Anticipated Caregiver: 6 sisters to asisst when [name] works as well as 11 yo Mother Anticipated Industrial/product designer Information: see contacts Ability/Limitations of Caregiver: [name] works 8-5, but other  sisters can rotate Public house manager Availability: 24/7 Discharge Plan Discussed with Primary Caregiver: Yes Is Caregiver In Agreement with Plan?: Yes Does Caregiver/Family have Issues with Lodging/Transportation while Pt is in Rehab?: No  Goals Patient/Family Goal for Rehab: supervision PT, supervision to min OT, supervision SLP Expected length of stay: elos 7 to 10 days Pt/Family Agrees to Admission and willing to participate: Yes Program Orientation Provided & Reviewed with Pt/Caregiver Including Roles  & Responsibilities: Yes  Decrease burden of Care through IP rehab admission: n/a  Possible need for SNF placement upon discharge:not anticipated  Patient Condition: This  patient's condition remains as documented in the consult dated 09/02/23, in which the Rehabilitation Physician determined and documented that the patient's condition is appropriate for intensive rehabilitative care in an inpatient rehabilitation facility. Will admit to inpatient rehab today.  Preadmission Screen Completed By:  Alison Heron Lot, RN MSN 09/07/2023 10:43 AM ______________________________________________________________________   Discussed status with Dr. Babs on 09/07/23 at 1042 and received approval for admission today.  Admission Coordinator:  Alison Heron Lot RN MSN time [id number] Date 09/07/23

## 2023-09-01 NOTE — Progress Notes (Signed)
 Inpatient Rehab Admissions Coordinator Note:   Per updated therapy recommendations patient was screened for CIR candidacy by Reche FORBES Lowers, PT. At this time, pt appears to be a potential candidate for CIR. I will place an order for rehab consult for full assessment, per our protocol.  Please contact me any with questions.[name] Reche Lowers, PT, DPT [phone] 09/01/23 4:17 PM

## 2023-09-01 NOTE — Progress Notes (Signed)
 PROGRESS NOTE    [name]  FMW:[id number] DOB: 05-12-1965 DOA: 08/18/2023 PCP: Minerva Rosina HERO, MD   Brief Narrative: [name] is a 58 y.o. female with a history of [place] gravis, moderate persistent asthma, chronic respite failure with hypoxia and hypercarbia on 2 L/min of oxygen , pulmonary embolism, hypertension, hypothyroidism, depression, morbid obesity, OSA on CPAP.  Patient presented secondary to shortness of breath and was found to have evidence of an acquired pneumonia and started on antibiotics.  During hospitalization, patient developed worsening mental status and respiratory status with evidence of myasthenia gravis exacerbation.  Neurology was consulted.  Patient was initially managed on BiPAP but eventually required transfer to ICU for intubation and mechanical ventilation.  Patient treated with 5 days of IVIG with improvement in symptoms.   Assessment and Plan:  Myasthenia gravis exacerbation Occurred during hospitalization.  Neurology consulted.  Patient found to have hypercarbia and was placed on BiPAP.  Pulmonary critical care medicine was consulted and patient was transferred to ICU and eventually intubated.  Patient was treated with IVIG for total of 5 days by neurology with improvement of symptoms. -Continue pyridostigmine   [facility] pneumonia Present on admission. Patient started empirically on ceftriaxone  and doxycycline .  Patient completed course.  Acute on chronic respiratory failure with hypoxia and hypercarbia Acute respite failure secondary to pneumonia in addition to myasthenia gravis exacerbation.  Complicated by underlying OSA/OHS and asthma.  Patient was initially managed on BiPAP for hypercarbia, however she was subsequently intubated on 8/6 for mechanical ventilation in the ICU.  Patient was successfully extubated on 8/10. -Wean to 2 L/min of oxygen   Right knee pain New problem. Not related to recent ambulation, per patient. No known  injury. -Check right knee x-ray -Continue Tylenol  as needed  Hypernatremia Mild. Likely related to poor oral intake. Appears to be resolved.  Hypokalemia Mild. Resolved with potassium supplementation.  Hyperkalemia Resolved.  Primary hypertension -Continue losartan   Hypothyroidism -Continue Synthroid   Depression Anxiety -Continue Abilify   Splenic laceration with hematoma Noted on CT imaging.  Unclear cause.  Patient does note history of fall about 2 to 3 months ago.  General surgery was consulted this admission and recommended supportive care.  Normocytic anemia Noted. Hemoglobin stable.  OSA -Continue CPAP  Obesity, class III Estimated body mass index is 53.3 kg/m as calculated from the following:   Height as of this encounter: 5' (1.524 m).   Weight as of this encounter: 123.8 kg.   DVT prophylaxis: SCDs Code Status:   Code Status: Full Code Family Communication: None at bedside Disposition Plan: Discharge to SNF once bed is available. Medically stable for discharge at this time.   Consultants:  General surgery Neurology PCCM  Procedures:  Intubation/extubation Cortrak feeding tube placement  Antimicrobials: Doxycycline  Ceftriaxone     Subjective: Patient reports some right knee pain that started a day ago. No migration. No known injury. Walking on it does not seem to worsen her symptoms and Tylenol  has helped with her pain slightly.  Objective: BP (!) 122/59 (BP Location: Right Arm)   Pulse 75   Temp 98.3 F (36.8 C)   Resp 20   Ht 5' (1.524 m)   Wt 123.8 kg   SpO2 99%   BMI 53.30 kg/m   Examination:  General exam: Appears calm and comfortable Respiratory system: Clear to auscultation. Respiratory effort normal. Cardiovascular system: S1 & S2 heard, RRR. No murmurs, rubs, gallops or clicks. Gastrointestinal system: Abdomen is nondistended, soft and nontender. Normal bowel sounds heard.  Central nervous system: Alert and oriented. No  focal neurological deficits. Musculoskeletal: No calf tenderness. Tenderness over right quadriceps tendon. Small right knee effusion but difficult to tell 2/2 body habitus. Psychiatry: Judgement and insight appear normal. Mood & affect appropriate.    Data Reviewed: I have personally reviewed following labs and imaging studies  CBC Lab Results  Component Value Date   WBC 7.3 08/31/2023   RBC 3.69 (L) 08/31/2023   HGB 10.7 (L) 08/31/2023   HCT 35.6 (L) 08/31/2023   MCV 96.5 08/31/2023   MCH 29.0 08/31/2023   PLT 278 08/31/2023   MCHC 30.1 08/31/2023   RDW 14.9 08/31/2023   LYMPHSABS 1.0 08/31/2023   MONOABS 0.7 08/31/2023   EOSABS 0.3 08/31/2023   BASOSABS 0.1 08/31/2023     Last metabolic panel Lab Results  Component Value Date   NA 141 08/31/2023   K 4.1 08/31/2023   CL 96 (L) 08/31/2023   CO2 37 (H) 08/31/2023   BUN 16 08/31/2023   CREATININE 0.55 08/31/2023   GLUCOSE 119 (H) 08/31/2023   GFRNONAA >60 08/31/2023   GFRAA >60 12/21/2018   CALCIUM  9.1 08/31/2023   PHOS 3.4 08/31/2023   PROT 7.4 08/31/2023   ALBUMIN 2.6 (L) 08/31/2023   BILITOT 0.7 08/31/2023   ALKPHOS 39 08/31/2023   AST 33 08/31/2023   ALT 25 08/31/2023   ANIONGAP 8 08/31/2023    GFR: Estimated Creatinine Clearance: 92.9 mL/min (by C-G formula based on SCr of 0.55 mg/dL).  Recent Results (from the past 240 hours)  MRSA Next Gen by PCR, Nasal     Status: None   Collection Time: 08/25/23 12:04 PM   Specimen: Nasal Mucosa; Nasal Swab  Result Value Ref Range Status   MRSA by PCR Next Gen NOT DETECTED NOT DETECTED Final    Comment: (NOTE) The GeneXpert MRSA Assay (FDA approved for NASAL specimens only), is one component of a comprehensive MRSA colonization surveillance program. It is not intended to diagnose MRSA infection nor to guide or monitor treatment for MRSA infections. Test performance is not FDA approved in patients less than 41 years old. Performed at [facility], [address], [place], [place] [address]       Radiology Studies: No results found.    LOS: 14 days    Elgin Lam, MD [facility] 09/01/2023, 1:35 PM   If 7PM-7AM, please contact night-coverage [email or web address]

## 2023-09-02 ENCOUNTER — Inpatient Hospital Stay (HOSPITAL_COMMUNITY)

## 2023-09-02 DIAGNOSIS — A419 Sepsis, unspecified organism: Secondary | ICD-10-CM | POA: Diagnosis not present

## 2023-09-02 DIAGNOSIS — J189 Pneumonia, unspecified organism: Secondary | ICD-10-CM | POA: Diagnosis not present

## 2023-09-02 DIAGNOSIS — G5793 Unspecified mononeuropathy of bilateral lower limbs: Secondary | ICD-10-CM | POA: Diagnosis not present

## 2023-09-02 DIAGNOSIS — G7000 Myasthenia gravis without (acute) exacerbation: Secondary | ICD-10-CM | POA: Diagnosis not present

## 2023-09-02 LAB — GLUCOSE, CAPILLARY
Glucose-Capillary: 110 mg/dL — ABNORMAL HIGH (ref 70–99)
Glucose-Capillary: 127 mg/dL — ABNORMAL HIGH (ref 70–99)
Glucose-Capillary: 128 mg/dL — ABNORMAL HIGH (ref 70–99)
Glucose-Capillary: 131 mg/dL — ABNORMAL HIGH (ref 70–99)

## 2023-09-02 MED ORDER — DICLOFENAC SODIUM 1 % EX GEL
4.0000 g | Freq: Four times a day (QID) | CUTANEOUS | Status: DC
  Administered 2023-09-02 – 2023-09-07 (×19): 4 g via TOPICAL
  Filled 2023-09-02: qty 100

## 2023-09-02 NOTE — Progress Notes (Signed)
   Inpatient Rehabilitation Admissions Coordinator   I will begin Auth with [facility] Medicare for possible CIR admit. Please refer to Dr Lord consult.  Heron Leavell, RN, MSN Rehab Admissions Coordinator [phone] 09/02/2023 3:11 PM

## 2023-09-02 NOTE — Progress Notes (Signed)
 Nutrition Follow-up  DOCUMENTATION CODES:   Morbid obesity  INTERVENTION:  -Continue regular menu, regular texture, thin liquids diet as ordered -Add Ensure Plus High Protein BID to promote adequate kcal/pro intake -Discussed importance of adequate kcal/pro intake   NUTRITION DIAGNOSIS:  Inadequate oral intake related to acute illness (myasthenia gravis) as evidenced by NPO status.  Resolved  Increased nutrient needs related to acute on chronic illness as evidenced by estimated needs.  New goal  GOAL:   Patient will meet greater than or equal to 90% of their needs  Progressing  MONITOR:   PO intake, Skin, Supplement acceptance, Labs, Weight trends  REASON FOR ASSESSMENT:   Ventilator, Rounds    ASSESSMENT:   Pt admitted on 7/30 for sepsis d/t bilateral lower lobe PNA and splenic infarct. PMH significant for myasthenia gravis, asthma, OSA on nocturnal CPAP, chronic hypoxic and hypercapnic respiratory failure, HTN, hypothyroidism, depression  Spoke to pt and family in room. Pt up in chair eating lunch at time of visit. She denies n/v/c/d or chewing/swallowing difficulties at this time. Last BM 8/14. Pt with good PO intake, 75-100% x 24hrs. Discussed with MD-d/c EN and pull tube today. Pt agreeable to this plan. Per MD pt medically stable for d/c, possibly CIR or SNF. Encouraged pt to continue to consume adequate kcal/pro intake for healing, maintaining function. Pt/family denies additional questions/concerns at this time, will continue to monitor, RDN available prn.   Labs Chloride 96 CO2 BG 110-131 Albumin 2.6 H/H 10.7/35.6  Medications  albuterol   2.5 mg Nebulization BID   ARIPiprazole   2 mg Oral Daily   budesonide  (PULMICORT ) nebulizer solution  0.5 mg Nebulization BID   capsaicin    Topical BID   Chlorhexidine  Gluconate Cloth  6 each Topical Daily   levothyroxine   200 mcg Oral QAC breakfast   losartan   50 mg Oral Daily   montelukast   10 mg Oral QHS    mupirocin  cream   Topical BID   mycophenolate   1,000 mg Oral BID   mouth rinse  15 mL Mouth Rinse 4 times per day   pantoprazole   40 mg Oral Daily   polyethylene glycol  17 g Oral Daily   pregabalin   200 mg Oral BID   pyridostigmine   30 mg Oral 5 X Daily   sodium chloride  flush  10-40 mL Intracatheter Q12H   sodium chloride  flush  3 mL Intravenous Q6H     NUTRITION - FOCUSED PHYSICAL EXAM:  Flowsheet Row Most Recent Value  Orbital Region No depletion  Upper Arm Region Unable to assess  [non-pitting moderate edema]  Thoracic and Lumbar Region No depletion  Buccal Region Unable to assess  [vent]  Temple Region No depletion  Clavicle Bone Region No depletion  Clavicle and Acromion Bone Region No depletion  Scapular Bone Region No depletion  Dorsal Hand Unable to assess  [handmits]  Patellar Region Unable to assess  [mild pitting edema]  Anterior Thigh Region Unable to assess  Posterior Calf Region Unable to assess    Diet Order:   Diet Order             Diet regular Fluid consistency: Thin  Diet effective now                   EDUCATION NEEDS:   Education needs have been addressed  Skin:  Skin Assessment: Reviewed RN Assessment  Last BM:  8/14  Height:   Ht Readings from Last 1 Encounters:  08/25/23 5' (1.524 m)  Weight:   Wt Readings from Last 1 Encounters:  09/02/23 130.5 kg    Ideal Body Weight:  45.5 kg  BMI:  Body mass index is 56.19 kg/m.  Estimated Nutritional Needs:   Kcal:  1450-1825 kcal  Protein:  90-105g  Fluid:  >/=1.5L  Bailen Geffre Daml-Budig, RDN, LDN Registered Dietitian Nutritionist RD Inpatient Contact Info in [place]

## 2023-09-02 NOTE — Plan of Care (Signed)

## 2023-09-02 NOTE — Progress Notes (Signed)
 NEUROLOGY CONSULT FOLLOW UP NOTE   Date of service: September 02, 2023 Patient Name: [name] MRN:  [id number] DOB:  Sep 19, 1965  Interval Hx/subjective  Was able to walk to the door and back with PT yesterday.  Vitals   Vitals:   09/02/23 0011 09/02/23 0335 09/02/23 0339 09/02/23 0810  BP: (!) 145/55 (!) 154/59  (!) 115/49  Pulse: 73 71 73 74  Resp: 20 18 (!) 25 18  Temp: 98.4 F (36.9 C) 98.6 F (37 C)  99.4 F (37.4 C)  TempSrc:      SpO2: 100% 100% 100% 97%  Weight:   130.5 kg   Height:         Body mass index is 56.19 kg/m.  Physical Exam   Constitutional: Morbidly obese. NAD.  Psych: Affect appropriate to situation.  Respiratory: Effort normal, non-labored breathing  Neurologic Examination   Ment: Alert and fully oriented. Speech is normal.   CN:    II: Fixates normally. Ptosis present in right eye, with exotropia    III,IV,VI: Right eye with ptosis (chronic). EOM intact on the left, with right eye EOM weak and with exotropia, which patient states is chronic: Right eye laterally deviated and slightly supraducted relative to the left when at rest and she fixates with left eye during conversation. No nystagmus   VII: Eyelid closure was full bilaterally, but weak.  Smile symmetric.   VIII: Hearing intact to voice   IX,X: Voice normal    XI: Weak neck flexion 4/5  XII: Tongue protrudes midline, no atrophy or fasciculations  *Motor:   Normal bulk.  No tremor, rigidity or bradykinesia. No pronator drift.  Strength: Dlt Bic Tri WE WrF FgS Gr HF KnF KnE PlF DoF    Left 4 4 4 4 4 4 4 3 4 4 4 4     Right 4 4 4 4 4 4 4 3 4 4 4 4   Sensory: Intact to FT x 4 proximally. Decreased sensation to distal BLE (history of neuropathy) Cerebellar: No ataxia with FNF bilaterally Gait: Deferred  Medications  Current Facility-Administered Medications:    acetaminophen  (TYLENOL ) tablet 650 mg, 650 mg, Oral, Q6H PRN, 650 mg at 09/01/23 1127 **OR** acetaminophen  (TYLENOL )  suppository 650 mg, 650 mg, Rectal, Q6H PRN, Olalere, Adewale A, MD   albuterol  (PROVENTIL ) (2.5 MG/3ML) 0.083% nebulizer solution 2.5 mg, 2.5 mg, Nebulization, Q6H PRN, Tobie Jorie SAUNDERS, MD   albuterol  (PROVENTIL ) (2.5 MG/3ML) 0.083% nebulizer solution 2.5 mg, 2.5 mg, Nebulization, BID, Sheikh, Omair Latif, DO, 2.5 mg at 09/02/23 0732   ARIPiprazole  (ABILIFY ) tablet 2 mg, 2 mg, Oral, Daily, Olalere, Adewale A, MD, 2 mg at 09/02/23 0834   budesonide  (PULMICORT ) nebulizer solution 0.5 mg, 0.5 mg, Nebulization, BID, Desai, Rahul P, PA-C, 0.5 mg at 09/02/23 0732   capsaicin  (ZOSTRIX) 0.025 % cream, , Topical, BID, Nooruddin, Saad, MD, Given at 09/02/23 1008   Chlorhexidine  Gluconate Cloth 2 % PADS 6 each, 6 each, Topical, Daily, Sherrill Cable Latif, DO, 6 each at 09/02/23 1009   feeding supplement (OSMOLITE 1.5 CAL) liquid 660 mL, 660 mL, Per Tube, Continuous, Olalere, Adewale A, MD, Last Rate: 55 mL/hr at 08/31/23 1614, Infusion Verify at 08/31/23 1614   feeding supplement (PROSource TF20) liquid 60 mL, 60 mL, Per Tube, Daily, Pawar, Rahul, MD, 60 mL at 09/02/23 1009   free water  200 mL, 200 mL, Per Tube, Q6H, Jenna Coy E, NP, 200 mL at 09/02/23 0418   hydrALAZINE  (APRESOLINE ) injection 10 mg,  10 mg, Intravenous, Q6H PRN, Rashid, Farhan, MD   levothyroxine  (SYNTHROID ) tablet 200 mcg, 200 mcg, Oral, QAC breakfast, Olalere, Adewale A, MD, 200 mcg at 09/02/23 0525   lip balm (CARMEX) ointment, , Topical, PRN, Byrum, Robert S, MD   losartan  (COZAAR ) tablet 50 mg, 50 mg, Oral, Daily, Sheikh, Omair Latif, DO, 50 mg at 09/02/23 9165   montelukast  (SINGULAIR ) tablet 10 mg, 10 mg, Oral, QHS, Paytes, Austin A, RPH, 10 mg at 09/01/23 2146   mupirocin  cream (BACTROBAN ) 2 %, , Topical, BID, Dino Antu, MD, Given at 09/02/23 1009   mycophenolate  (CELLCEPT ) capsule 1,000 mg, 1,000 mg, Oral, BID, Olalere, Adewale A, MD, 1,000 mg at 09/02/23 9165   ondansetron  (ZOFRAN ) tablet 4 mg, 4 mg, Oral, Q6H PRN **OR**  ondansetron  (ZOFRAN ) injection 4 mg, 4 mg, Intravenous, Q6H PRN, Olalere, Adewale A, MD   Oral care mouth rinse, 15 mL, Mouth Rinse, 4 times per day, Shelah Lamar RAMAN, MD, 15 mL at 09/02/23 9164   Oral care mouth rinse, 15 mL, Mouth Rinse, PRN, Byrum, Robert S, MD   pantoprazole  (PROTONIX ) EC tablet 40 mg, 40 mg, Oral, Daily, Paytes, Austin A, RPH, 40 mg at 09/02/23 0834   polyethylene glycol (MIRALAX  / GLYCOLAX ) packet 17 g, 17 g, Oral, Daily, Olalere, Adewale A, MD, 17 g at 09/02/23 9165   pregabalin  (LYRICA ) capsule 200 mg, 200 mg, Oral, BID, Rosan Deward ORN, NP, 200 mg at 09/02/23 9165   pyridostigmine  (MESTINON ) tablet 30 mg, 30 mg, Oral, 5 X Daily, Olalere, Adewale A, MD, 30 mg at 09/02/23 0834   sodium chloride  flush (NS) 0.9 % injection 10-40 mL, 10-40 mL, Intracatheter, Q12H, Geronimo Amel, MD, 10 mL at 09/02/23 1009   sodium chloride  flush (NS) 0.9 % injection 10-40 mL, 10-40 mL, Intracatheter, PRN, Geronimo Amel, MD   sodium chloride  flush (NS) 0.9 % injection 3 mL, 3 mL, Intravenous, Q6H, Pawar, Rahul, MD, 3 mL at 09/02/23 1009  Labs and Diagnostic Imaging   CBC:  Recent Labs  Lab 08/30/23 0500 08/31/23 1009  WBC 7.1 7.3  NEUTROABS  --  5.2  HGB 10.2* 10.7*  HCT 33.1* 35.6*  MCV 95.4 96.5  PLT 221 278    Basic Metabolic Panel:  Lab Results  Component Value Date   NA 141 08/31/2023   K 4.1 08/31/2023   CO2 37 (H) 08/31/2023   GLUCOSE 119 (H) 08/31/2023   BUN 16 08/31/2023   CREATININE 0.55 08/31/2023   CALCIUM  9.1 08/31/2023   GFRNONAA >60 08/31/2023   GFRAA >60 12/21/2018   Lipid Panel: No results found for: LDLCALC HgbA1c:  Lab Results  Component Value Date   HGBA1C 6.0 (H) 08/16/2016   Urine Drug Screen: No results found for: LABOPIA, COCAINSCRNUR, LABBENZ, AMPHETMU, THCU, LABBARB  Alcohol Level No results found for: [facility] INR  Lab Results  Component Value Date   INR 1.2 08/19/2023     Assessment  [name] is a 58  y.o. female with a PMHx of [place] negative Myathenia Gravis, moderate persistent asthma, OSA on CPAP, chronic hypoxic and hypercarbic resp failure on 2L [place] PTA, admitted for acute on chronic respiratory failure in the setting of sepsis from bilateral lower lobe pneumonia. Hospital course was complicated by splenic laceration and subcapsular hematoma. Pt became very weak and there was concern for MG crisis for which she was intubated and started on IVIG. Her last dose of IVIG was on Sunday. She has improved significantly but her strength has plateaued.  No longer intubated. Yesterday, she was able to walk to the door of her room and back.   Recommendations  Myasthenia Gravis Crisis, now resolved  Pt has completed a 5 day course of IVIG for a total of 5 days. Overall appears well, and she feels like she has improved. Her strength is still not at her baseline, and this may be her new baseline if she has lost muscle bulk due decreased motor activity secondary to weakness during this stay. Discussed again with her the options of more treatment at this time with PLEX, however pt has opted for conservative route with focus on strengthening with physical therapy and occupational therapy. She also notes that when she received PLEX for an exacerbation 15 years ago, it had no effect. Re-emphasized the importance of getting out of bed every day, and sitting in a chair.  - Overall clinically has improved, but may need rehab. Also will need to establish care with outpatient neurology here in [facility]  - Continue cellcept  and mestinon  - Offered PLEX treatment however pt would like to trial PT/OT instead for further recovery. Will respect wishes. Discussed this again with her today and she reiterates that she would like to hold off on PLEX.  - Daily NIF and FVC with RT - Continue PT/OT - She has complained of some bilateral neuropathic pain in her lower extremities. Continue trial of capsaicin  cream, which was started  yesterday.   - Neurology will sign off at this time. We will be avaialble if there are any additional questions or concerns.    ______________________________________________________________________   Bonney SHARK, Reigan Tolliver, MD [facility]

## 2023-09-02 NOTE — Progress Notes (Signed)
 PROGRESS NOTE    [name]  FMW:[id number] DOB: Mar 29, 1965 DOA: 08/18/2023 PCP: Minerva Rosina HERO, MD   Brief Narrative: [name] is a 58 y.o. female with a history of [place] gravis, moderate persistent asthma, chronic respite failure with hypoxia and hypercarbia on 2 L/min of oxygen , pulmonary embolism, hypertension, hypothyroidism, depression, morbid obesity, OSA on CPAP.  Patient presented secondary to shortness of breath and was found to have evidence of an acquired pneumonia and started on antibiotics.  During hospitalization, patient developed worsening mental status and respiratory status with evidence of myasthenia gravis exacerbation.  Neurology was consulted.  Patient was initially managed on BiPAP but eventually required transfer to ICU for intubation and mechanical ventilation.  Patient treated with 5 days of IVIG with improvement in symptoms.   Assessment and Plan:  Myasthenia gravis exacerbation Occurred during hospitalization.  Neurology consulted.  Patient found to have hypercarbia and was placed on BiPAP.  Pulmonary critical care medicine was consulted and patient was transferred to ICU and eventually intubated.  Patient was treated with IVIG for total of 5 days by neurology with improvement of symptoms. -Continue pyridostigmine   Community acquired pneumonia Present on admission. Patient started empirically on ceftriaxone  and doxycycline .  Patient completed course.  Acute on chronic respiratory failure with hypoxia and hypercarbia Acute respite failure secondary to pneumonia in addition to myasthenia gravis exacerbation.  Complicated by underlying OSA/OHS and asthma.  Patient was initially managed on BiPAP for hypercarbia, however she was subsequently intubated on 8/6 for mechanical ventilation in the ICU.  Patient was successfully extubated on 8/10. -Wean to 2 L/min of oxygen   Right knee pain New problem. Not related to recent ambulation, per patient. No  known injury. -Check right knee x-ray (pending) and likely consult orthopedic surgery for aspiration -Continue Tylenol  as needed -Apply ice pack -Apply voltaren  gel  Hypernatremia Mild. Likely related to poor oral intake. Appears to be resolved.  Hypokalemia Mild. Resolved with potassium supplementation.  Hyperkalemia Resolved.  Primary hypertension -Continue losartan   Hypothyroidism -Continue Synthroid   Depression Anxiety -Continue Abilify   Splenic laceration with hematoma Noted on CT imaging.  Unclear cause.  Patient does note history of fall about 2 to 3 months ago.  General surgery was consulted this admission and recommended supportive care.  Normocytic anemia Noted. Hemoglobin stable.  OSA -Continue CPAP  Obesity, class III Estimated body mass index is 56.19 kg/m as calculated from the following:   Height as of this encounter: 5' (1.524 m).   Weight as of this encounter: 130.5 kg.   DVT prophylaxis: SCDs Code Status:   Code Status: Full Code Family Communication: Aunt and grand neice at bedside Disposition Plan: Discharge to inpatient rehabilitation once bed is available and pending management of right knee pain   Consultants:  General surgery Neurology PCCM  Procedures:  Intubation/extubation Cortrak feeding tube placement  Antimicrobials: Doxycycline  Ceftriaxone     Subjective: Continued right knee pain  Objective: BP (!) 115/49 (BP Location: Right Arm)   Pulse 74   Temp 99.4 F (37.4 C)   Resp 18   Ht 5' (1.524 m)   Wt 130.5 kg   SpO2 97%   BMI 56.19 kg/m   Examination:  General exam: Appears calm and comfortable Respiratory system: Clear to auscultation. Respiratory effort normal. Cardiovascular system: S1 & S2 heard, RRR. No murmurs, rubs, gallops or clicks. Gastrointestinal system: Abdomen is nondistended, soft and nontender. Normal bowel sounds heard. Central nervous system: Alert and oriented. No focal neurological  deficits.  Musculoskeletal: No calf tenderness. Tenderness over right quadriceps tendon. Moderate right knee effusion. Pain with flexion of right knee past about 150 degrees. Psychiatry: Judgement and insight appear normal. Mood & affect appropriate.    Data Reviewed: I have personally reviewed following labs and imaging studies  CBC Lab Results  Component Value Date   WBC 7.3 08/31/2023   RBC 3.69 (L) 08/31/2023   HGB 10.7 (L) 08/31/2023   HCT 35.6 (L) 08/31/2023   MCV 96.5 08/31/2023   MCH 29.0 08/31/2023   PLT 278 08/31/2023   MCHC 30.1 08/31/2023   RDW 14.9 08/31/2023   LYMPHSABS 1.0 08/31/2023   MONOABS 0.7 08/31/2023   EOSABS 0.3 08/31/2023   BASOSABS 0.1 08/31/2023     Last metabolic panel Lab Results  Component Value Date   NA 141 08/31/2023   K 4.1 08/31/2023   CL 96 (L) 08/31/2023   CO2 37 (H) 08/31/2023   BUN 16 08/31/2023   CREATININE 0.55 08/31/2023   GLUCOSE 119 (H) 08/31/2023   GFRNONAA >60 08/31/2023   GFRAA >60 12/21/2018   CALCIUM  9.1 08/31/2023   PHOS 3.4 08/31/2023   PROT 7.4 08/31/2023   ALBUMIN 2.6 (L) 08/31/2023   BILITOT 0.7 08/31/2023   ALKPHOS 39 08/31/2023   AST 33 08/31/2023   ALT 25 08/31/2023   ANIONGAP 8 08/31/2023    GFR: Estimated Creatinine Clearance: 96.2 mL/min (by C-G formula based on SCr of 0.55 mg/dL).  Recent Results (from the past 240 hours)  MRSA Next Gen by PCR, Nasal     Status: None   Collection Time: 08/25/23 12:04 PM   Specimen: Nasal Mucosa; Nasal Swab  Result Value Ref Range Status   MRSA by PCR Next Gen NOT DETECTED NOT DETECTED Final    Comment: (NOTE) The GeneXpert MRSA Assay (FDA approved for NASAL specimens only), is one component of a comprehensive MRSA colonization surveillance program. It is not intended to diagnose MRSA infection nor to guide or monitor treatment for MRSA infections. Test performance is not FDA approved in patients less than 66 years old. Performed at [facility], [address] [place], [place] [address]       Radiology Studies: No results found.    LOS: 15 days    Elgin Lam, MD [facility] 09/02/2023, 11:57 AM   If 7PM-7AM, please contact night-coverage [email or web address]

## 2023-09-02 NOTE — Consult Note (Signed)
 Physical Medicine and Rehabilitation Consult Reason for Consult: Evaluate appropriateness for Inpatient Rehab Referring Physician: Dr. Elgin Copa    HPI: [name] is a 58 y.o. female with PMHx of  has a past medical history of Asthma, Depression, Heart murmur, Myasthenia gravis (HCC), Obesity, Pulmonary embolism (HCC), Sleep apnea, and Thyroid  disease. . They were admitted to [facility] on 08/18/2023 for shortness of breath.  On intake, she endorsed 2 days of cough with productive brown sputum, compliance with home oxygen  and CPAP, and bilateral upper abdominal pain.  ED evaluation was significant for leukocytosis 16.2, troponin 130, lactic acid 2.6.  CTA chest negative for PE, however complicated by adjacent atelectasis.  CT abdomen and pelvis showed splenic enhancement with some enlargement, possible subcapsular fluid collections/splenic laceration, and mild patchy opacities in the bilateral lower lobes concerning for pneumonia.  She was started on IV fluids and ceftriaxone , admitted to the hospitalist service.  Hospitalization was complicated by worsening mental status and respiratory failure with evidence of myasthenia gravis exacerbation.  Neurology was consulted and patient was eventually transferred to the ICU for intubation and mechanical ventilation, treated with 5 days of IVIG and pyridostigmine , and extubated on 8-10.  She was offered Plex therapy by neurology but refused.  She is currently requiring nighttime BiPAP and on 2 L of oxygen .  Hospitalization was further complicated by community-acquired pneumonia completing antibiotics ceftriaxone /doxycycline , right knee pain, bilateral neuropathic pain, hyponatremia, hypokalemia, hypertension, depression/anxiety, splenic laceration with hematoma with surgery recommending supportive care, normocytic anemia, and obesity.[name]  PM&R was consulted to evaluate appropriateness for IPR admission.   Per documentation, the patient  lives in a single-story home with steps to enter, lives with her family including a sister who works during 8-5 and 6 other sisters nearby.  At baseline, she is modified independent of ADLs but requires assistance from her sisters for IADLs.  She is independent of mobility without an assistive device at baseline.  Currently, she is min assist for supine to sit, mod assist +2 for transfers, and can ambulate 12 feet with a rolling walker at a contact-guard assist level.  She is limited by dizziness with walking and shortness of breath with strenuous activities.   Review of Systems  Constitutional:  Positive for malaise/fatigue. Negative for chills and fever.  Eyes:  Positive for double vision.  Respiratory:  Positive for cough and shortness of breath.   Cardiovascular:  Negative for chest pain and palpitations.  Gastrointestinal:  Negative for nausea and vomiting.  Musculoskeletal:  Negative for falls.  Neurological:  Positive for dizziness and weakness.   Past Medical History:  Diagnosis Date   Asthma    Depression    Heart murmur    Myasthenia gravis (HCC)    Obesity    Pulmonary embolism (HCC)    Sleep apnea    Thyroid  disease    Past Surgical History:  Procedure Laterality Date   BREAST SURGERY  06/2019   breast reduction   THYROID  SURGERY     TONSILLECTOMY     TUBAL LIGATION     History reviewed. No pertinent family history. Social History:  reports that she has quit smoking. She has never used smokeless tobacco. She reports current alcohol use. She reports that she does not use drugs. Allergies:  Allergies  Allergen Reactions   Magnesium  Sulfate Other (See Comments)    Myasthenia gravis. Consider withholding replacement unless severely low.    Medications Prior to Admission  Medication Sig  Dispense Refill   acetaminophen  (TYLENOL ) 500 MG tablet Take 500 mg by mouth every 6 (six) hours as needed for mild pain.     albuterol  (PROVENTIL ) (2.5 MG/3ML) 0.083% nebulizer  solution Take 2.5 mg by nebulization every 6 (six) hours as needed for wheezing or shortness of breath.     albuterol  (VENTOLIN  HFA) 108 (90 Base) MCG/ACT inhaler Inhale 2 puffs into the lungs every 4 (four) hours as needed for wheezing or shortness of breath.      ARIPiprazole  (ABILIFY ) 2 MG tablet Take 2 mg by mouth daily.     cyanocobalamin (VITAMIN B12) 1000 MCG tablet Take 1 tablet by mouth daily.     famotidine  (PEPCID ) 20 MG tablet Take 1 tablet (20 mg total) by mouth 2 (two) times daily. 30 tablet 0   ferrous sulfate 324 MG TBEC Take 324 mg by mouth daily with breakfast.     levothyroxine  (SYNTHROID ) 200 MCG tablet Take 200 mcg by mouth daily before breakfast.     losartan  (COZAAR ) 50 MG tablet Take 50 mg by mouth daily.     mirtazapine  (REMERON ) 15 MG tablet Take 15 mg by mouth at bedtime.     montelukast  (SINGULAIR ) 10 MG tablet Take 10 mg by mouth at bedtime.     mycophenolate  (CELLCEPT ) 500 MG tablet Take 1,000 mg by mouth 2 (two) times daily.  0   pregabalin  (LYRICA ) 200 MG capsule Take 200 mg by mouth 2 (two) times daily.     pyridostigmine  (MESTINON ) 60 MG tablet Take 30 mg by mouth 5 (five) times daily.   0   Pyridoxine HCl (VITAMIN B6 PO) Take 1,000 mcg by mouth daily.      Home: Home Living Family/patient expects to be discharged to:: Private residence Living Arrangements:  (Lives with sister, [name]) Available Help at Discharge:  (73 yo Mom and mutiple sisters can assist at discharge) Type of Home: House Home Access: Stairs to enter Entrance Stairs-Rails: Right, Left (5 to 7 steps) Home Layout: One level Bathroom Shower/Tub: Armed forces operational officer Accessibility: Yes Home Equipment: Other (comment) (home O2) Additional Comments: sister works 8-5; she has 6 other sisters near by  Lives With: Family (Sister, Engineer, drilling)  Functional History: Prior Function Prior Level of Function : Independent/Modified Independent Mobility Comments:  indep, no AD, denies falls ADLs Comments: sister assists with IADLs Functional Status:  Mobility: Bed Mobility Overal bed mobility: Independent Bed Mobility: Supine to Sit Rolling: Mod assist Supine to sit: HOB elevated, Used rails, Min assist Sit to supine: Max assist General bed mobility comments: Increased time and assist with LE management schooting to L EOB. Max cueing for sequencing. Transfers Overall transfer level: Needs assistance Equipment used: Rolling walker (2 wheels) Transfers: Sit to/from Stand, Bed to chair/wheelchair/BSC Sit to Stand: +2 physical assistance, Mod assist, Min assist, From elevated surface Bed to/from chair/wheelchair/BSC transfer type:: Step pivot Step pivot transfers: Mod assist  Lateral/Scoot Transfers: Total assist General transfer comment: Performed 2 sit to stands (1 from elevated EOB and 1 from recliner). ModA for elevated EOB. +2 MinA from recliner Ambulation/Gait Ambulation/Gait assistance: Contact guard assist Gait Distance (Feet): 12 Feet Assistive device: Rolling walker (2 wheels) Gait Pattern/deviations: Step-to pattern, Decreased stride length, Shuffle, Trunk flexed General Gait Details: Dizziness with walking as pt states she was holding her breath. Cues to continue breathing even with strenuous activity Gait velocity: Decreased Gait velocity interpretation: <1.31 ft/sec, indicative of household ambulator Pre-gait activities: Attempted to have pt take lateral steps  toward [facility], pt unable to offload each LE to march or take shuffled steps, pt sits impulsively due to fatigue/BLE weakness.    ADL: ADL Overall ADL's : Needs assistance/impaired Eating/Feeding: Set up, Bed level Grooming: Wash/dry hands, Wash/dry face, Set up, Sitting Upper Body Bathing: Contact guard assist, Sitting Lower Body Bathing: Maximal assistance Upper Body Dressing : Minimal assistance Lower Body Dressing: Total assistance Lower Body Dressing Details (indicate  cue type and reason): socks Toilet Transfer: Total assistance, +2 for safety/equipment, +2 for physical assistance Toilet Transfer Details (indicate cue type and reason): simulated. Able to STS with mod A +2, unable to progress to stepping Toileting- Clothing Manipulation and Hygiene: Total assistance, +2 for physical assistance, +2 for safety/equipment, Sit to/from stand Functional mobility during ADLs: Moderate assistance, +2 for physical assistance, +2 for safety/equipment General ADL Comments: focused on sitting EOB and sit to stands  Cognition: Cognition Orientation Level: Oriented X4 Cognition Arousal: Alert Behavior During Therapy: WFL for tasks assessed/performed  Blood pressure (!) 128/54, pulse 77, temperature 98.7 F (37.1 C), resp. rate 17, height 5' (1.524 m), weight 130.5 kg, SpO2 92%. Physical Exam  PE: Patient exam limited due to x-ray studies being performed. Constitution: Appropriate appearance for age. No apparent distress  +Obese Eyes: R ptosis, exotropia; can track in all quadrants Resp: No respiratory distress. No accessory muscle usage. on RA Cardio: Well perfused appearance. No peripheral edema. Abdomen: Nondistended.  Psych: Appropriate mood and affect. Neuro: AAOx4. No apparent cognitive deficits   Neurologic Exam:   Motor exam: Moving all 4 limbs antigravity  Coordination: Fine motor coordination was normal.       Results for orders placed or performed during the hospital encounter of 08/18/23 (from the past 24 hours)  Glucose, capillary     Status: Abnormal   Collection Time: 09/01/23 12:54 PM  Result Value Ref Range   Glucose-Capillary 100 (H) 70 - 99 mg/dL  Glucose, capillary     Status: Abnormal   Collection Time: 09/01/23  8:30 PM  Result Value Ref Range   Glucose-Capillary 120 (H) 70 - 99 mg/dL  Glucose, capillary     Status: Abnormal   Collection Time: 09/02/23 12:12 AM  Result Value Ref Range   Glucose-Capillary 128 (H) 70 - 99 mg/dL   Glucose, capillary     Status: Abnormal   Collection Time: 09/02/23  3:34 AM  Result Value Ref Range   Glucose-Capillary 131 (H) 70 - 99 mg/dL  Glucose, capillary     Status: Abnormal   Collection Time: 09/02/23  8:11 AM  Result Value Ref Range   Glucose-Capillary 127 (H) 70 - 99 mg/dL  Glucose, capillary     Status: Abnormal   Collection Time: 09/02/23 12:17 PM  Result Value Ref Range   Glucose-Capillary 110 (H) 70 - 99 mg/dL   No results found.  Assessment/Plan: Diagnosis: Debility secondary to exacerbation of myasthenia gravis Does the need for close, 24 hr/day medical supervision in concert with the patient's rehab needs make it unreasonable for this patient to be served in a less intensive setting? Yes Co-Morbidities requiring supervision/potential complications: Myasthenia gravis exacerbation,community-acquired pneumonia/acute hypoxic respiratory failure, right knee pain, bilateral neuropathic pain, hyponatremia, hypokalemia, hypertension, depression/anxiety, splenic laceration with hematoma with surgery recommending supportive care, normocytic anemia, and obesity Due to safety, skin/wound care, disease management, medication administration, and patient education, does the patient require 24 hr/day rehab nursing? Yes Does the patient require coordinated care of a physician, rehab nurse, therapy disciplines of PT, OT to  address physical and functional deficits in the context of the above medical diagnosis(es)? Yes Addressing deficits in the following areas: balance, endurance, locomotion, strength, transferring, bathing, dressing, feeding, grooming, toileting, and psychosocial support Can the patient actively participate in an intensive therapy program of at least 3 hrs of therapy per day at least 5 days per week? Yes The potential for patient to make measurable gains while on inpatient rehab is excellent Anticipated functional outcomes upon discharge from inpatient rehab are  supervision  with PT, supervision with OT Estimated rehab length of stay to reach the above functional goals is: 7-10 days Anticipated discharge destination: Home Overall Rehab/Functional Prognosis: good  POST ACUTE RECOMMENDATIONS: This patient's condition is appropriate for continued rehabilitative care in the following setting: CIR Patient has agreed to participate in recommended program. Yes Note that insurance prior authorization may be required for reimbursement for recommended care.    I have personally performed a face to face diagnostic evaluation of this patient. Additionally, I have examined the patient's medical record including any pertinent labs and radiographic images. If the physician assistant has documented in this note, I have reviewed and edited or otherwise concur with the physician assistant's documentation.  Thanks,  Joesph JAYSON Likes, DO 09/02/2023

## 2023-09-03 DIAGNOSIS — G7000 Myasthenia gravis without (acute) exacerbation: Secondary | ICD-10-CM | POA: Diagnosis not present

## 2023-09-03 DIAGNOSIS — J189 Pneumonia, unspecified organism: Secondary | ICD-10-CM | POA: Diagnosis not present

## 2023-09-03 DIAGNOSIS — A419 Sepsis, unspecified organism: Secondary | ICD-10-CM | POA: Diagnosis not present

## 2023-09-03 LAB — GLUCOSE, CAPILLARY: Glucose-Capillary: 106 mg/dL — ABNORMAL HIGH (ref 70–99)

## 2023-09-03 NOTE — Progress Notes (Signed)
 Physical Therapy Treatment Patient Details Name: RITHA SAMPEDRO MRN: [id number] DOB: March 24, 1965 Today's Date: 09/03/2023   History of Present Illness [name] is a 58 y.o. female who presented with worsening SOB. Found to have PNA and CTA 7/30 w/ apparent splenic lacerations in the midpole region with surrounding subcapsular hematoma. No free fluid in the abdomen or pelvis. No active extravasation of contrast. Worsening respiratory status on 08/25/23, transferred to ICU and intubated. PMHx: obesity, depression, asthma, OSA, myasthenia gravis, chronic hypoxic hypercarbic respiratory failure on 2 L O2, remote history of PE (not on anticoagulation), HTN, hypothyroidism    PT Comments  Pt up in chair on arrival and agreeable to session with slow but steady progress towards acute goals as pt continues to fatigue quickly with decreased activity tolerance. Pt able to come to stand from recliner chair with CGA for safety with good hand placement and perform short in room gait with chair follow for safety. Distance limited to pt stated tolerance due to fatigue, despite encouragement to continue. Pt able to perform seated LE therex for increased LE strength and stamina with pt verbalizing understanding of education to complete throughout day between therapies. Continued education on IS use and frequency with pt able to pull up to x5 and verbalizing understanding of education for completion x10/hr. Pt up in chair at end of session with all needs met. Pt continues to benefit from skilled PT services to progress toward functional mobility goals.      If plan is discharge home, recommend the following: Assistance with cooking/housework;Help with stairs or ramp for entrance;Assist for transportation;A lot of help with walking and/or transfers;A little help with bathing/dressing/bathroom   Can travel by private vehicle     No  Equipment Recommendations  Wheelchair cushion (measurements  PT);Wheelchair (measurements PT);Hospital bed;Rolling walker (2 wheels)    Recommendations for Other Services       Precautions / Restrictions Precautions Precautions: Fall Recall of Precautions/Restrictions: Intact Restrictions Weight Bearing Restrictions Per Provider Order: No     Mobility  Bed Mobility Overal bed mobility: Needs Assistance             General bed mobility comments: pt up in chair on arrival and at end of session    Transfers Overall transfer level: Needs assistance Equipment used: Rolling walker (2 wheels) Transfers: Sit to/from Stand, Bed to chair/wheelchair/BSC Sit to Stand: Contact guard assist           General transfer comment: good recall for hand placement with pt pushing up from armrests with bil UE, no pysical assist needed to rise    Ambulation/Gait Ambulation/Gait assistance: Contact guard assist, +2 safety/equipment (chair follow) Gait Distance (Feet): 15 Feet Assistive device: Rolling walker (2 wheels) Gait Pattern/deviations: Step-to pattern, Decreased stride length, Shuffle, Trunk flexed Gait velocity: decr     General Gait Details: slow cuatious gait with RW for support, step to pattern due to RLE pain, trunk flexed throughout and heavy reliance on RW support, no LOB   Stairs             Wheelchair Mobility     Tilt Bed    Modified Rankin (Stroke Patients Only)       Balance Overall balance assessment: Needs assistance Sitting-balance support: Feet supported Sitting balance-Leahy Scale: Fair Sitting balance - Comments: EOB   Standing balance support: Bilateral upper extremity supported, During functional activity, Reliant on assistive device for balance Standing balance-Leahy Scale: Poor Standing balance comment: reliant on RW for  support due to RLE knee pain                            Communication Communication Communication: No apparent difficulties  Cognition Arousal: Alert Behavior  During Therapy: [facility] for tasks assessed/performed                             Following commands: Intact Following commands impaired: Follows one step commands with increased time    Cueing Cueing Techniques: Verbal cues, Gestural cues, Tactile cues  Exercises General Exercises - Lower Extremity Long Arc Quad: AROM, Right, Left, 10 reps, Seated Hip Flexion/Marching: AROM, Right, Left, 20 reps, Seated Other Exercises Other Exercises: educated on IS use with pt able to pull up to x5, encouraged x10/hr    General Comments General comments (skin integrity, edema, etc.): VSS on 2L O2      Pertinent Vitals/Pain Pain Assessment Pain Assessment: Faces Faces Pain Scale: Hurts little more Pain Location: right knee Pain Descriptors / Indicators: Grimacing, Guarding, Aching Pain Intervention(s): Monitored during session, Limited activity within patient's tolerance    Home Living                          Prior Function            PT Goals (current goals can now be found in the care plan section) Acute Rehab PT Goals PT Goal Formulation: With patient Time For Goal Achievement: 09/09/23 Progress towards PT goals: Progressing toward goals    Frequency    Min 2X/week      PT Plan      Co-evaluation              AM-PAC PT 6 Clicks Mobility   Outcome Measure  Help needed turning from your back to your side while in a flat bed without using bedrails?: A Lot Help needed moving from lying on your back to sitting on the side of a flat bed without using bedrails?: A Lot Help needed moving to and from a bed to a chair (including a wheelchair)?: A Little Help needed standing up from a chair using your arms (e.g., wheelchair or bedside chair)?: A Little Help needed to walk in hospital room?: A Lot (<40') Help needed climbing 3-5 steps with a railing? : Total 6 Click Score: 13    End of Session Equipment Utilized During Treatment: Oxygen  Activity  Tolerance: Patient tolerated treatment well;Patient limited by fatigue Patient left: in chair;with call bell/phone within reach;with nursing/sitter in room Nurse Communication: Mobility status PT Visit Diagnosis: Other abnormalities of gait and mobility (R26.89);Muscle weakness (generalized) (M62.81)     Time: 9065-9046 PT Time Calculation (min) (ACUTE ONLY): 19 min  Charges:    $Therapeutic Exercise: 8-22 mins PT General Charges $$ ACUTE PT VISIT: 1 Visit                     Tamzin Bertling R. PTA Acute Rehabilitation Services Office: [phone]   Therisa CHRISTELLA Boor 09/03/2023, 10:00 AM

## 2023-09-03 NOTE — Plan of Care (Signed)

## 2023-09-03 NOTE — Progress Notes (Signed)
 PROGRESS NOTE    [name]  FMW:[id number] DOB: February 05, 1965 DOA: 08/18/2023 PCP: Minerva Rosina HERO, MD   Brief Narrative: [name] is a 58 y.o. female with a history of Messina gravis, moderate persistent asthma, chronic respite failure with hypoxia and hypercarbia on 2 L/min of oxygen , pulmonary embolism, hypertension, hypothyroidism, depression, morbid obesity, OSA on CPAP.  Patient presented secondary to shortness of breath and was found to have evidence of an acquired pneumonia and started on antibiotics.  During hospitalization, patient developed worsening mental status and respiratory status with evidence of myasthenia gravis exacerbation.  Neurology was consulted.  Patient was initially managed on BiPAP but eventually required transfer to ICU for intubation and mechanical ventilation.  Patient treated with 5 days of IVIG with improvement in symptoms.   Assessment and Plan:  Myasthenia gravis exacerbation Occurred during hospitalization.  Neurology consulted.  Patient found to have hypercarbia and was placed on BiPAP.  Pulmonary critical care medicine was consulted and patient was transferred to ICU and eventually intubated.  Patient was treated with IVIG for total of 5 days by neurology with improvement of symptoms. -Continue pyridostigmine   Community acquired pneumonia Present on admission. Patient started empirically on ceftriaxone  and doxycycline .  Patient completed course.  Acute on chronic respiratory failure with hypoxia and hypercarbia Acute respite failure secondary to pneumonia in addition to myasthenia gravis exacerbation.  Complicated by underlying OSA/OHS and asthma.  Patient was initially managed on BiPAP for hypercarbia, however she was subsequently intubated on 8/6 for mechanical ventilation in the ICU.  Patient was successfully extubated on 8/10. -Wean to 2 L/min of oxygen   Right knee arthritis New problem. Not related to recent ambulation, per  patient. No known injury. X-ray significant for arthritis and small effusion. Pain has improved with ice pack and Voltaren  gel -Continue Tylenol  as needed -Continue ice pack as needed -Continue voltaren  gel  Hypernatremia Mild. Likely related to poor oral intake. Appears to be resolved.  Hypokalemia Mild. Resolved with potassium supplementation.  Hyperkalemia Resolved.  Primary hypertension -Continue losartan   Hypothyroidism -Continue Synthroid   Depression Anxiety -Continue Abilify   Splenic laceration with hematoma Noted on CT imaging.  Unclear cause.  Patient does note history of fall about 2 to 3 months ago.  General surgery was consulted this admission and recommended supportive care.  Normocytic anemia Noted. Hemoglobin stable.  OSA -Continue CPAP  Obesity, class III Estimated body mass index is 53.56 kg/m as calculated from the following:   Height as of this encounter: 5' (1.524 m).   Weight as of this encounter: 124.4 kg.   DVT prophylaxis: SCDs Code Status:   Code Status: Full Code Family Communication: Sister at bedside Disposition Plan: Discharge to inpatient rehabilitation once bed is available   Consultants:  General surgery Neurology PCCM  Procedures:  Intubation/extubation Cortrak feeding tube placement  Antimicrobials: Doxycycline  Ceftriaxone     Subjective: Knee pain improved today. No other concerns.  Objective: BP (!) 111/53 (BP Location: Right Arm)   Pulse 66   Temp 97.8 F (36.6 C) (Oral)   Resp 16   Ht 5' (1.524 m)   Wt 124.4 kg   SpO2 95%   BMI 53.56 kg/m   Examination:  General exam: Appears calm and comfortable Respiratory system: Clear to auscultation. Respiratory effort normal. Cardiovascular system: S1 & S2 heard, RRR. No murmurs. Gastrointestinal system: Abdomen is nondistended, soft and nontender. Normal bowel sounds heard. Central nervous system: Alert and oriented. No focal neurological  deficits. Psychiatry: Judgement and insight  appear normal. Mood & affect appropriate.    Data Reviewed: I have personally reviewed following labs and imaging studies  CBC Lab Results  Component Value Date   WBC 7.3 08/31/2023   RBC 3.69 (L) 08/31/2023   HGB 10.7 (L) 08/31/2023   HCT 35.6 (L) 08/31/2023   MCV 96.5 08/31/2023   MCH 29.0 08/31/2023   PLT 278 08/31/2023   MCHC 30.1 08/31/2023   RDW 14.9 08/31/2023   LYMPHSABS 1.0 08/31/2023   MONOABS 0.7 08/31/2023   EOSABS 0.3 08/31/2023   BASOSABS 0.1 08/31/2023     Last metabolic panel Lab Results  Component Value Date   NA 141 08/31/2023   K 4.1 08/31/2023   CL 96 (L) 08/31/2023   CO2 37 (H) 08/31/2023   BUN 16 08/31/2023   CREATININE 0.55 08/31/2023   GLUCOSE 119 (H) 08/31/2023   GFRNONAA >60 08/31/2023   GFRAA >60 12/21/2018   CALCIUM  9.1 08/31/2023   PHOS 3.4 08/31/2023   PROT 7.4 08/31/2023   ALBUMIN 2.6 (L) 08/31/2023   BILITOT 0.7 08/31/2023   ALKPHOS 39 08/31/2023   AST 33 08/31/2023   ALT 25 08/31/2023   ANIONGAP 8 08/31/2023    GFR: Estimated Creatinine Clearance: 93.3 mL/min (by C-G formula based on SCr of 0.55 mg/dL).  Recent Results (from the past 240 hours)  MRSA Next Gen by PCR, Nasal     Status: None   Collection Time: 08/25/23 12:04 PM   Specimen: Nasal Mucosa; Nasal Swab  Result Value Ref Range Status   MRSA by PCR Next Gen NOT DETECTED NOT DETECTED Final    Comment: (NOTE) The GeneXpert MRSA Assay (FDA approved for NASAL specimens only), is one component of a comprehensive MRSA colonization surveillance program. It is not intended to diagnose MRSA infection nor to guide or monitor treatment for MRSA infections. Test performance is not FDA approved in patients less than 30 years old. Performed at [facility], [address] [place], [place] [address]       Radiology Studies: DG Knee Right Port Result Date: 09/02/2023 CLINICAL DATA:  Right knee pain. EXAM: PORTABLE RIGHT  KNEE - 1-2 VIEW COMPARISON:  12/26/2017 FINDINGS: AP and lateral views of the knee. The osteochondral lesion in the medial femoral condyle on prior exam is not seen, possibly due to positioning. No evidence of acute fracture. Normal alignment. Moderate patellofemoral osteoarthritis. Minimal joint effusion. Mild soft tissue edema. IMPRESSION: Negative. Electronically Signed   By: Andrea Gasman M.D.   On: 09/02/2023 16:00      LOS: 16 days    Elgin Lam, MD [facility] 09/03/2023, 8:15 AM   If 7PM-7AM, please contact night-coverage [email or web address]

## 2023-09-03 NOTE — Progress Notes (Signed)
   Inpatient Rehabilitation Admissions Coordinator   We have received insurance approval for CIR. I met with patient and family at bedside and they are aware. I wait bed availability to admit. Acute team and TOC made aware.  Heron Leavell, RN, MSN Rehab Admissions Coordinator [phone] 09/03/2023 1:25 PM

## 2023-09-03 NOTE — Plan of Care (Signed)
   Problem: Education: Goal: Knowledge of General Education information will improve Description: Including pain rating scale, medication(s)/side effects and non-pharmacologic comfort measures Outcome: Progressing   Problem: Clinical Measurements: Goal: Respiratory complications will improve Outcome: Progressing   Problem: Coping: Goal: Level of anxiety will decrease Outcome: Progressing   Problem: Pain Managment: Goal: General experience of comfort will improve and/or be controlled Outcome: Progressing   Problem: Safety: Goal: Ability to remain free from injury will improve Outcome: Progressing

## 2023-09-03 NOTE — Progress Notes (Signed)
 Occupational Therapy Treatment Patient Details Name: [name] MRN: [id number] DOB: 09/10/1965 Today's Date: 09/03/2023   History of present illness [name] is a 58 y.o. female who presented with worsening SOB. Found to have PNA and CTA 7/30 w/ apparent splenic lacerations in the midpole region with surrounding subcapsular hematoma. No free fluid in the abdomen or pelvis. No active extravasation of contrast. Worsening respiratory status on 08/25/23, transferred to ICU and intubated. PMHx: obesity, depression, asthma, OSA, myasthenia gravis, chronic hypoxic hypercarbic respiratory failure on 2 L O2, remote history of PE (not on anticoagulation), HTN, hypothyroidism   OT comments  Patient received in supine and eager to participate. Patient with complaints of RLE knee pain today with min assist to get to EOB and mod assist to transfer to recliner. Patient requires increased time for bed mobility and transfers. Patient will benefit from intensive inpatient follow-up therapy, >3 hours/day.  Acute OT to continue to follow to address established goals to facilitate DC to next venue of care.        If plan is discharge home, recommend the following:  Assistance with cooking/housework;Direct supervision/assist for medications management;Direct supervision/assist for financial management;Assist for transportation;Help with stairs or ramp for entrance;A lot of help with walking and/or transfers;A lot of help with bathing/dressing/bathroom   Equipment Recommendations  Other (comment) (defer)    Recommendations for Other Services      Precautions / Restrictions Precautions Precautions: Fall Recall of Precautions/Restrictions: Intact Restrictions Weight Bearing Restrictions Per Provider Order: No       Mobility Bed Mobility Overal bed mobility: Needs Assistance Bed Mobility: Supine to Sit     Supine to sit: HOB elevated, Used rails, Min assist     General bed mobility  comments: increased time and assistance with RLE due to pain    Transfers Overall transfer level: Needs assistance Equipment used: Rolling walker (2 wheels) Transfers: Sit to/from Stand, Bed to chair/wheelchair/BSC Sit to Stand: Mod assist, From elevated surface     Step pivot transfers: Mod assist     General transfer comment: cues for hand placement and raised bed with mod assist to stand and step pivot     Balance Overall balance assessment: Needs assistance Sitting-balance support: Feet supported Sitting balance-Leahy Scale: Fair Sitting balance - Comments: EOB   Standing balance support: Bilateral upper extremity supported, During functional activity, Reliant on assistive device for balance Standing balance-Leahy Scale: Poor Standing balance comment: reliant on RW for support due to RLE knee pain                           ADL either performed or assessed with clinical judgement   ADL Overall ADL's : Needs assistance/impaired Eating/Feeding: Set up;Sitting   Grooming: Wash/dry hands;Wash/dry face;Oral care;Set up;Sitting           Upper Body Dressing : Minimal assistance;Sitting   Lower Body Dressing: Total assistance Lower Body Dressing Details (indicate cue type and reason): socks                    Extremity/Trunk Assessment              Vision       Perception     Praxis     Communication Communication Communication: No apparent difficulties   Cognition Arousal: Alert Behavior During Therapy: WFL for tasks assessed/performed Cognition: No apparent impairments  OT - Cognition Comments: alert and oriented x4                 Following commands: Intact Following commands impaired: Follows one step commands with increased time      Cueing   Cueing Techniques: Verbal cues, Gestural cues, Tactile cues  Exercises      Shoulder Instructions       General Comments VSS on 2L O2 SpO2 96%     Pertinent Vitals/ Pain       Pain Assessment Pain Assessment: 0-10 Pain Score: 7  Pain Location: right knee Pain Descriptors / Indicators: Grimacing, Guarding, Aching Pain Intervention(s): Limited activity within patient's tolerance, Monitored during session, Patient requesting pain meds-RN notified, Repositioned  Home Living                                          Prior Functioning/Environment              Frequency  Min 2X/week        Progress Toward Goals  OT Goals(current goals can now be found in the care plan section)  Progress towards OT goals: Progressing toward goals  Acute Rehab OT Goals Patient Stated Goal: to go to rehab OT Goal Formulation: With patient Time For Goal Achievement: 09/16/23 Potential to Achieve Goals: Fair ADL Goals Pt Will Perform Grooming: with min assist;sitting Pt Will Perform Upper Body Dressing: with min assist;sitting Pt Will Perform Lower Body Dressing: with supervision;sit to/from stand Pt Will Transfer to Toilet: with supervision;ambulating Additional ADL Goal #1: Pt will complete will bed mobility with moderate assistance as a precursor to ADLs. Additional ADL Goal #2: Pt will demonstrate fair sitting balance x 5 minutes in preparation for ADLs. Additional ADL Goal #3: Pt will stand with +2 mod assist as a precursor to toilet transfers.  Plan      Co-evaluation                 AM-PAC OT 6 Clicks Daily Activity     Outcome Measure   Help from another person eating meals?: A Little Help from another person taking care of personal grooming?: A Little Help from another person toileting, which includes using toliet, bedpan, or urinal?: Total Help from another person bathing (including washing, rinsing, drying)?: A Lot Help from another person to put on and taking off regular upper body clothing?: A Lot Help from another person to put on and taking off regular lower body clothing?: Total 6 Click  Score: 12    End of Session Equipment Utilized During Treatment: Gait belt;Rolling walker (2 wheels);Oxygen   OT Visit Diagnosis: Muscle weakness (generalized) (M62.81)   Activity Tolerance Patient tolerated treatment well   Patient Left in chair;with call bell/phone within reach;with chair alarm set;with family/visitor present   Nurse Communication Mobility status        Time: 9278-9252 OT Time Calculation (min): 26 min  Charges: OT General Charges $OT Visit: 1 Visit OT Treatments $Self Care/Home Management : 23-37 mins  Dick Laine, OTA Acute Rehabilitation Services  Office [phone]   [name] Laine 09/03/2023, 9:14 AM

## 2023-09-04 ENCOUNTER — Inpatient Hospital Stay (HOSPITAL_COMMUNITY)

## 2023-09-04 DIAGNOSIS — J189 Pneumonia, unspecified organism: Secondary | ICD-10-CM | POA: Diagnosis not present

## 2023-09-04 DIAGNOSIS — A419 Sepsis, unspecified organism: Secondary | ICD-10-CM | POA: Diagnosis not present

## 2023-09-04 DIAGNOSIS — G7000 Myasthenia gravis without (acute) exacerbation: Secondary | ICD-10-CM | POA: Diagnosis not present

## 2023-09-04 DIAGNOSIS — M79661 Pain in right lower leg: Secondary | ICD-10-CM

## 2023-09-04 NOTE — Progress Notes (Signed)
 Mobility Specialist: Progress Note   09/04/23 1000  Mobility  Activity Ambulated with assistance  Level of Assistance Contact guard assist, steadying assist  Assistive Device Front wheel walker  Distance Ambulated (ft) 25 ft  Activity Response Tolerated well  Mobility Referral Yes  Mobility visit 1 Mobility  Mobility Specialist Start Time (ACUTE ONLY) 0950  Mobility Specialist Stop Time (ACUTE ONLY) 1011  Mobility Specialist Time Calculation (min) (ACUTE ONLY) 21 min    Pt received in bed, agreeable to mobility session. ModA for bed mobility to assist with scooting EOB and trunk elevation. ModA for STS. CGA for ambulation. C/o feeling a little unsteady mid-way through ambulation. SpO2 90% on 2LO2. Ambulated to the door and then to the corner to the room and sat in the chair. Left in chair with all needs met, call bell in reach.   Ileana Lute Mobility Specialist Please contact via SecureChat or Rehab office at [phone]

## 2023-09-04 NOTE — Progress Notes (Signed)
 PROGRESS NOTE    [name]  FMW:[id number] DOB: [birth year] DOA: 08/18/2023 PCP: Minerva Rosina HERO, MD   Brief Narrative: [name] is a 58 y.o. female with a history of Messina gravis, moderate persistent asthma, chronic respite failure with hypoxia and hypercarbia on 2 L/min of oxygen , pulmonary embolism, hypertension, hypothyroidism, depression, morbid obesity, OSA on CPAP.  Patient presented secondary to shortness of breath and was found to have evidence of an acquired pneumonia and started on antibiotics.  During hospitalization, patient developed worsening mental status and respiratory status with evidence of myasthenia gravis exacerbation.  Neurology was consulted.  Patient was initially managed on BiPAP but eventually required transfer to ICU for intubation and mechanical ventilation.  Patient treated with 5 days of IVIG with improvement in symptoms.   Assessment and Plan:  Myasthenia gravis exacerbation Occurred during hospitalization.  Neurology consulted.  Patient found to have hypercarbia and was placed on BiPAP.  Pulmonary critical care medicine was consulted and patient was transferred to ICU and eventually intubated.  Patient was treated with IVIG for total of 5 days by neurology with improvement of symptoms. -Continue pyridostigmine   Community acquired pneumonia Present on admission. Patient started empirically on ceftriaxone  and doxycycline .  Patient completed course.  Acute on chronic respiratory failure with hypoxia and hypercarbia Acute respite failure secondary to pneumonia in addition to myasthenia gravis exacerbation.  Complicated by underlying OSA/OHS and asthma.  Patient was initially managed on BiPAP for hypercarbia, however she was subsequently intubated on 8/6 for mechanical ventilation in the ICU.  Patient was successfully extubated on 8/10. -Wean to 2 L/min of oxygen   Right knee arthritis New problem. Not related to recent ambulation, per  patient. No known injury. X-ray significant for arthritis and small effusion. Pain has improved with ice pack and Voltaren  gel -Continue Tylenol  as needed -Continue ice pack as needed -Continue voltaren  gel  Peripheral neuropathy Vitamin B12 elevated. Patient started on Lyrica . -Continue Lyrica  -Check LE venous duplex scan  Hypernatremia Mild. Likely related to poor oral intake. Appears to be resolved.  Hypokalemia Mild. Resolved with potassium supplementation.  Hyperkalemia Resolved.  Primary hypertension -Continue losartan   Hypothyroidism -Continue Synthroid   Depression Anxiety -Continue Abilify   Splenic laceration with hematoma Noted on CT imaging.  Unclear cause.  Patient does note history of fall about 2 to 3 months ago.  General surgery was consulted this admission and recommended supportive care.  Normocytic anemia Noted. Hemoglobin stable.  OSA -Continue CPAP  Obesity, class III Estimated body mass index is 53.86 kg/m as calculated from the following:   Height as of this encounter: 5' (1.524 m).   Weight as of this encounter: 125.1 kg.   DVT prophylaxis: SCDs Code Status:   Code Status: Full Code Family Communication: None at bedside; patient decline for me to contact family. Disposition Plan: Discharge to inpatient rehabilitation once bed is available   Consultants:  General surgery Neurology PCCM  Procedures:  Intubation/extubation Cortrak feeding tube placement  Antimicrobials: Doxycycline  Ceftriaxone     Subjective: Knee pain improved today. No other concerns.  Objective: BP (!) 151/59 (BP Location: Right Arm)   Pulse 71   Temp 98.5 F (36.9 C) (Oral)   Resp 20   Ht 5' (1.524 m)   Wt 125.1 kg   SpO2 99%   BMI 53.86 kg/m   Examination:  General exam: Appears calm and comfortable Respiratory system: Clear to auscultation. Respiratory effort normal. Cardiovascular system: S1 & S2 heard, RRR. No murmurs. Gastrointestinal  system:  Abdomen is nondistended, soft and nontender. Normal bowel sounds heard. Central nervous system: Alert and oriented. No focal neurological deficits. Psychiatry: Judgement and insight appear normal. Mood & affect appropriate.    Data Reviewed: I have personally reviewed following labs and imaging studies  CBC Lab Results  Component Value Date   WBC 7.3 08/31/2023   RBC 3.69 (L) 08/31/2023   HGB 10.7 (L) 08/31/2023   HCT 35.6 (L) 08/31/2023   MCV 96.5 08/31/2023   MCH 29.0 08/31/2023   PLT 278 08/31/2023   MCHC 30.1 08/31/2023   RDW 14.9 08/31/2023   LYMPHSABS 1.0 08/31/2023   MONOABS 0.7 08/31/2023   EOSABS 0.3 08/31/2023   BASOSABS 0.1 08/31/2023     Last metabolic panel Lab Results  Component Value Date   NA 141 08/31/2023   K 4.1 08/31/2023   CL 96 (L) 08/31/2023   CO2 37 (H) 08/31/2023   BUN 16 08/31/2023   CREATININE 0.55 08/31/2023   GLUCOSE 119 (H) 08/31/2023   GFRNONAA >60 08/31/2023   GFRAA >60 12/21/2018   CALCIUM  9.1 08/31/2023   PHOS 3.4 08/31/2023   PROT 7.4 08/31/2023   ALBUMIN 2.6 (L) 08/31/2023   BILITOT 0.7 08/31/2023   ALKPHOS 39 08/31/2023   AST 33 08/31/2023   ALT 25 08/31/2023   ANIONGAP 8 08/31/2023    GFR: Estimated Creatinine Clearance: 93.5 mL/min (by C-G formula based on SCr of 0.55 mg/dL).  Recent Results (from the past 240 hours)  MRSA Next Gen by PCR, Nasal     Status: None   Collection Time: 08/25/23 12:04 PM   Specimen: Nasal Mucosa; Nasal Swab  Result Value Ref Range Status   MRSA by PCR Next Gen NOT DETECTED NOT DETECTED Final    Comment: (NOTE) The GeneXpert MRSA Assay (FDA approved for NASAL specimens only), is one component of a comprehensive MRSA colonization surveillance program. It is not intended to diagnose MRSA infection nor to guide or monitor treatment for MRSA infections. Test performance is not FDA approved in patients less than 36 years old. Performed at [facility], [address],  [place], [place] [address]       Radiology Studies: DG Knee Right Port Result Date: 09/02/2023 CLINICAL DATA:  Right knee pain. EXAM: PORTABLE RIGHT KNEE - 1-2 VIEW COMPARISON:  12/26/2017 FINDINGS: AP and lateral views of the knee. The osteochondral lesion in the medial femoral condyle on prior exam is not seen, possibly due to positioning. No evidence of acute fracture. Normal alignment. Moderate patellofemoral osteoarthritis. Minimal joint effusion. Mild soft tissue edema. IMPRESSION: Negative. Electronically Signed   By: Andrea Gasman M.D.   On: 09/02/2023 16:00      LOS: 17 days    Elgin Lam, MD [facility] 09/04/2023, 9:49 AM   If 7PM-7AM, please contact night-coverage [email or web address]

## 2023-09-04 NOTE — Progress Notes (Signed)
 BLE venous duplex has been completed.   Results can be found under chart review under CV PROC. 09/04/2023 6:16 PM Regine Christian RVT, RDMS

## 2023-09-04 NOTE — Plan of Care (Signed)

## 2023-09-05 DIAGNOSIS — J189 Pneumonia, unspecified organism: Secondary | ICD-10-CM | POA: Diagnosis not present

## 2023-09-05 DIAGNOSIS — G7000 Myasthenia gravis without (acute) exacerbation: Secondary | ICD-10-CM | POA: Diagnosis not present

## 2023-09-05 DIAGNOSIS — A419 Sepsis, unspecified organism: Secondary | ICD-10-CM | POA: Diagnosis not present

## 2023-09-05 NOTE — Plan of Care (Signed)

## 2023-09-05 NOTE — Progress Notes (Signed)
 PROGRESS NOTE    [name]  FMW:[id number] DOB: [birth year] DOA: 08/18/2023 PCP: Minerva Rosina HERO, MD   Brief Narrative: [name] is a 58 y.o. female with a history of Messina gravis, moderate persistent asthma, chronic respite failure with hypoxia and hypercarbia on 2 L/min of oxygen , pulmonary embolism, hypertension, hypothyroidism, depression, morbid obesity, OSA on CPAP.  Patient presented secondary to shortness of breath and was found to have evidence of an acquired pneumonia and started on antibiotics.  During hospitalization, patient developed worsening mental status and respiratory status with evidence of myasthenia gravis exacerbation.  Neurology was consulted.  Patient was initially managed on BiPAP but eventually required transfer to ICU for intubation and mechanical ventilation.  Patient treated with 5 days of IVIG with improvement in symptoms.   Assessment and Plan:  Myasthenia gravis exacerbation Occurred during hospitalization.  Neurology consulted.  Patient found to have hypercarbia and was placed on BiPAP.  Pulmonary critical care medicine was consulted and patient was transferred to ICU and eventually intubated.  Patient was treated with IVIG for total of 5 days by neurology with improvement of symptoms. -Continue pyridostigmine  -Remove PICC prior to discharge  Community acquired pneumonia Present on admission. Patient started empirically on ceftriaxone  and doxycycline .  Patient completed course.  Acute on chronic respiratory failure with hypoxia and hypercarbia Acute respite failure secondary to pneumonia in addition to myasthenia gravis exacerbation.  Complicated by underlying OSA/OHS and asthma.  Patient was initially managed on BiPAP for hypercarbia, however she was subsequently intubated on 8/6 for mechanical ventilation in the ICU.  Patient was successfully extubated on 8/10. -Wean to 2 L/min of oxygen   Right knee arthritis New problem. Not related  to recent ambulation, per patient. No known injury. X-ray significant for arthritis and small effusion. Pain has improved with ice pack and Voltaren  gel -Continue Tylenol  as needed -Continue ice pack as needed -Continue voltaren  gel  Peripheral neuropathy Vitamin B12 elevated. Patient started on Lyrica . LE venous duplex scan negative for DVT. -Continue Lyrica  and capsaicin  cream  Hypernatremia Mild. Likely related to poor oral intake. Appears to be resolved.  Hypokalemia Mild. Resolved with potassium supplementation.  Hyperkalemia Resolved.  Primary hypertension -Continue losartan   Hypothyroidism -Continue Synthroid   Depression Anxiety -Continue Abilify   Splenic laceration with hematoma Noted on CT imaging.  Unclear cause.  Patient does note history of fall about 2 to 3 months ago.  General surgery was consulted this admission and recommended supportive care.  Normocytic anemia Noted. Hemoglobin stable.  OSA -Continue CPAP  Obesity, class III Estimated body mass index is 44.82 kg/m as calculated from the following:   Height as of this encounter: 5' (1.524 m).   Weight as of this encounter: 104.1 kg.   DVT prophylaxis: SCDs Code Status:   Code Status: Full Code Family Communication: Sister at bedside. Disposition Plan: Discharge to inpatient rehabilitation once bed is available   Consultants:  General surgery Neurology PCCM  Procedures:  Intubation/extubation Cortrak feeding tube placement  Antimicrobials: Doxycycline  Ceftriaxone     Subjective: No issues overnight.  Objective: BP (!) 112/49 (BP Location: Right Arm)   Pulse 72   Temp 98.4 F (36.9 C) (Oral)   Resp 18   Ht 5' (1.524 m)   Wt 104.1 kg   SpO2 98%   BMI 44.82 kg/m   Examination:  General exam: Appears calm and comfortable Respiratory system: Respiratory effort normal. Central nervous system: Alert and oriented. Psychiatry: Judgement and insight appear normal. Mood & affect  appropriate.    Data Reviewed: I have personally reviewed following labs and imaging studies  CBC Lab Results  Component Value Date   WBC 7.3 08/31/2023   RBC 3.69 (L) 08/31/2023   HGB 10.7 (L) 08/31/2023   HCT 35.6 (L) 08/31/2023   MCV 96.5 08/31/2023   MCH 29.0 08/31/2023   PLT 278 08/31/2023   MCHC 30.1 08/31/2023   RDW 14.9 08/31/2023   LYMPHSABS 1.0 08/31/2023   MONOABS 0.7 08/31/2023   EOSABS 0.3 08/31/2023   BASOSABS 0.1 08/31/2023     Last metabolic panel Lab Results  Component Value Date   NA 141 08/31/2023   K 4.1 08/31/2023   CL 96 (L) 08/31/2023   CO2 37 (H) 08/31/2023   BUN 16 08/31/2023   CREATININE 0.55 08/31/2023   GLUCOSE 119 (H) 08/31/2023   GFRNONAA >60 08/31/2023   GFRAA >60 12/21/2018   CALCIUM  9.1 08/31/2023   PHOS 3.4 08/31/2023   PROT 7.4 08/31/2023   ALBUMIN 2.6 (L) 08/31/2023   BILITOT 0.7 08/31/2023   ALKPHOS 39 08/31/2023   AST 33 08/31/2023   ALT 25 08/31/2023   ANIONGAP 8 08/31/2023    GFR: Estimated Creatinine Clearance: 83.4 mL/min (by C-G formula based on SCr of 0.55 mg/dL).  No results found for this or any previous visit (from the past 240 hours).     Radiology Studies: VAS US  LOWER EXTREMITY VENOUS (DVT) Result Date: 09/04/2023  Lower Venous DVT Study Patient Name:  [name]  Date of Exam:   09/04/2023 Medical Rec #: [id number]           Accession #:    [id number] Date of Birth: 1965/04/27           Patient Gender: F Patient Age:   59 years Exam Location:  [facility] Procedure:      VAS US  LOWER EXTREMITY VENOUS (DVT) Referring Phys: Nusrat Encarnacion --------------------------------------------------------------------------------  Indications: Pain.  Risk Factors: Hx of PE, prolonged hospitalization/decreased mobility. Limitations: Body habitus and poor ultrasound/tissue interface. Comparison Study: Previous exam on 08/17/2016 was negative for DVT. Performing Technologist: Ezzie Potters RVT, RDMS  Examination  Guidelines: A complete evaluation includes B-mode imaging, spectral Doppler, color Doppler, and power Doppler as needed of all accessible portions of each vessel. Bilateral testing is considered an integral part of a complete examination. Limited examinations for reoccurring indications may be performed as noted. The reflux portion of the exam is performed with the patient in reverse Trendelenburg.  +---------+---------------+---------+-----------+----------+--------------+ RIGHT    CompressibilityPhasicitySpontaneityPropertiesThrombus Aging +---------+---------------+---------+-----------+----------+--------------+ CFV      Full           Yes      Yes                                 +---------+---------------+---------+-----------+----------+--------------+ SFJ      Full                                                        +---------+---------------+---------+-----------+----------+--------------+ FV Prox  Full           Yes      Yes                                 +---------+---------------+---------+-----------+----------+--------------+  FV Mid   Full           Yes      Yes                                 +---------+---------------+---------+-----------+----------+--------------+ FV DistalFull           Yes      Yes                                 +---------+---------------+---------+-----------+----------+--------------+ PFV      Full                                                        +---------+---------------+---------+-----------+----------+--------------+ POP      Full           Yes      Yes                                 +---------+---------------+---------+-----------+----------+--------------+ PTV      Full                                                        +---------+---------------+---------+-----------+----------+--------------+ PERO     Full                                                         +---------+---------------+---------+-----------+----------+--------------+   +---------+---------------+---------+-----------+----------+--------------+ LEFT     CompressibilityPhasicitySpontaneityPropertiesThrombus Aging +---------+---------------+---------+-----------+----------+--------------+ CFV      Full           Yes      Yes                                 +---------+---------------+---------+-----------+----------+--------------+ SFJ      Full                                                        +---------+---------------+---------+-----------+----------+--------------+ FV Prox  Full           Yes      Yes                                 +---------+---------------+---------+-----------+----------+--------------+ FV Mid   Full           Yes      Yes                                 +---------+---------------+---------+-----------+----------+--------------+ FV DistalFull  Yes      Yes                                 +---------+---------------+---------+-----------+----------+--------------+ PFV      Full                                                        +---------+---------------+---------+-----------+----------+--------------+ POP      Full           Yes      Yes                                 +---------+---------------+---------+-----------+----------+--------------+ PTV      Full                                                        +---------+---------------+---------+-----------+----------+--------------+ PERO     Full                                                        +---------+---------------+---------+-----------+----------+--------------+     Summary: BILATERAL: - No evidence of deep vein thrombosis seen in the lower extremities, bilaterally. -No evidence of popliteal cyst, bilaterally.   *See table(s) above for measurements and observations.    Preliminary       LOS: 18 days    Elgin Lam, MD [facility] 09/05/2023, 9:03 AM   If 7PM-7AM, please contact night-coverage [email or web address]

## 2023-09-05 NOTE — Progress Notes (Signed)
 Mobility Specialist: Progress Note   09/05/23 1500  Mobility  Activity Ambulated with assistance  Level of Assistance Contact guard assist, steadying assist  Assistive Device Front wheel walker  Distance Ambulated (ft) 60 ft  Activity Response Tolerated well  Mobility Referral Yes  Mobility visit 1 Mobility  Mobility Specialist Start Time (ACUTE ONLY) 0849  Mobility Specialist Stop Time (ACUTE ONLY) 0909  Mobility Specialist Time Calculation (min) (ACUTE ONLY) 20 min    Pt received in bed, agreeable to mobility session. ModA for bed mobility to assist BLE and scoot EOB. Heavy modA for STS. CGA for ambulation with chair follow. Ambulated down the hallway and rolled back to the room in the recliner chair. SpO2 92-94% on 2LO2. Declined staying in the chair at this time. Returned pt to bed. Left in bed with all needs met, call bell in reach.   Ileana Lute Mobility Specialist Please contact via SecureChat or Rehab office at [phone]

## 2023-09-06 DIAGNOSIS — A419 Sepsis, unspecified organism: Secondary | ICD-10-CM | POA: Diagnosis not present

## 2023-09-06 DIAGNOSIS — J189 Pneumonia, unspecified organism: Secondary | ICD-10-CM | POA: Diagnosis not present

## 2023-09-06 DIAGNOSIS — R0602 Shortness of breath: Secondary | ICD-10-CM | POA: Diagnosis not present

## 2023-09-06 DIAGNOSIS — G7000 Myasthenia gravis without (acute) exacerbation: Secondary | ICD-10-CM | POA: Diagnosis not present

## 2023-09-06 NOTE — Progress Notes (Signed)
 PROGRESS NOTE    [name]  FMW:[id number] DOB: Jun 22, 1965 DOA: 08/18/2023 PCP: Minerva Rosina HERO, MD   Brief Narrative: [name] is a 58 y.o. female with a history of Messina gravis, moderate persistent asthma, chronic respite failure with hypoxia and hypercarbia on 2 L/min of oxygen , pulmonary embolism, hypertension, hypothyroidism, depression, morbid obesity, OSA on CPAP.  Patient presented secondary to shortness of breath and was found to have evidence of an acquired pneumonia and started on antibiotics.  During hospitalization, patient developed worsening mental status and respiratory status with evidence of myasthenia gravis exacerbation.  Neurology was consulted.  Patient was initially managed on BiPAP but eventually required transfer to ICU for intubation and mechanical ventilation.  Patient treated with 5 days of IVIG with improvement in symptoms.   Assessment and Plan:  Myasthenia gravis exacerbation Occurred during hospitalization.  Neurology consulted.  Patient found to have hypercarbia and was placed on BiPAP.  Pulmonary critical care medicine was consulted and patient was transferred to ICU and eventually intubated.  Patient was treated with IVIG for total of 5 days by neurology with improvement of symptoms. -Continue pyridostigmine  -Remove PICC prior to discharge  Community acquired pneumonia Present on admission. Patient started empirically on ceftriaxone  and doxycycline .  Patient completed course.  Acute on chronic respiratory failure with hypoxia and hypercarbia Acute respite failure secondary to pneumonia in addition to myasthenia gravis exacerbation.  Complicated by underlying OSA/OHS and asthma.  Patient was initially managed on BiPAP for hypercarbia, however she was subsequently intubated on 8/6 for mechanical ventilation in the ICU.  Patient was successfully extubated on 8/10. -Wean to 2 L/min of oxygen   Right knee arthritis New problem. Not related  to recent ambulation, per patient. No known injury. X-ray significant for arthritis and small effusion. Pain has improved with ice pack and Voltaren  gel -Continue Tylenol  as needed -Continue ice pack as needed -Continue voltaren  gel  Peripheral neuropathy Vitamin B12 elevated. Patient started on Lyrica . LE venous duplex scan negative for DVT. -Continue Lyrica  and capsaicin  cream  Hypernatremia Mild. Likely related to poor oral intake. Appears to be resolved.  Hypokalemia Mild. Resolved with potassium supplementation.  Hyperkalemia Resolved.  Primary hypertension -Continue losartan   Hypothyroidism -Continue Synthroid   Depression Anxiety -Continue Abilify   Splenic laceration with hematoma Noted on CT imaging.  Unclear cause.  Patient does note history of fall about 2 to 3 months ago.  General surgery was consulted this admission and recommended supportive care.  Normocytic anemia Noted. Hemoglobin stable.  OSA -Continue CPAP  Obesity, class III Estimated body mass index is 55.54 kg/m as calculated from the following:   Height as of this encounter: 5' (1.524 m).   Weight as of this encounter: 129 kg.   DVT prophylaxis: SCDs Code Status:   Code Status: Full Code Family Communication: None at bedside. Disposition Plan: Discharge to inpatient rehabilitation once bed is available   Consultants:  General surgery Neurology PCCM  Procedures:  Intubation/extubation Cortrak feeding tube placement  Antimicrobials: Doxycycline  Ceftriaxone     Subjective: No concerns this morning.  Objective: BP (!) 126/42 (BP Location: Right Wrist)   Pulse 76   Temp 97.6 F (36.4 C) (Oral)   Resp 19   Ht 5' (1.524 m)   Wt 129 kg   SpO2 96%   BMI 55.54 kg/m   Examination:  General exam: Appears calm and comfortable Respiratory system: Respiratory effort normal. Central nervous system: Alert and oriented. Psychiatry: Judgement and insight appear normal. Mood & affect  appropriate.    Data Reviewed: I have personally reviewed following labs and imaging studies  CBC Lab Results  Component Value Date   WBC 7.3 08/31/2023   RBC 3.69 (L) 08/31/2023   HGB 10.7 (L) 08/31/2023   HCT 35.6 (L) 08/31/2023   MCV 96.5 08/31/2023   MCH 29.0 08/31/2023   PLT 278 08/31/2023   MCHC 30.1 08/31/2023   RDW 14.9 08/31/2023   LYMPHSABS 1.0 08/31/2023   MONOABS 0.7 08/31/2023   EOSABS 0.3 08/31/2023   BASOSABS 0.1 08/31/2023     Last metabolic panel Lab Results  Component Value Date   NA 141 08/31/2023   K 4.1 08/31/2023   CL 96 (L) 08/31/2023   CO2 37 (H) 08/31/2023   BUN 16 08/31/2023   CREATININE 0.55 08/31/2023   GLUCOSE 119 (H) 08/31/2023   GFRNONAA >60 08/31/2023   GFRAA >60 12/21/2018   CALCIUM  9.1 08/31/2023   PHOS 3.4 08/31/2023   PROT 7.4 08/31/2023   ALBUMIN 2.6 (L) 08/31/2023   BILITOT 0.7 08/31/2023   ALKPHOS 39 08/31/2023   AST 33 08/31/2023   ALT 25 08/31/2023   ANIONGAP 8 08/31/2023    GFR: Estimated Creatinine Clearance: 95.5 mL/min (by C-G formula based on SCr of 0.55 mg/dL).  No results found for this or any previous visit (from the past 240 hours).     Radiology Studies: No results found.     LOS: 19 days    Elgin Lam, MD [facility] 09/06/2023, 2:50 PM   If 7PM-7AM, please contact night-coverage [email or web address]

## 2023-09-06 NOTE — Progress Notes (Signed)
 Inpatient Rehab Admissions Coordinator:   We continue to follow patient for potential CIR admission. Insurance shara was approved 8/15. No beds are available today, but we hope to admit in 1-2 days.   Rehab Admissons Coordinator Brevan Luberto, [place], [place] [phone]

## 2023-09-06 NOTE — Progress Notes (Signed)
 Occupational Therapy Treatment Patient Details Name: [name] MRN: [id number] DOB: [birth year] Today's Date: 09/06/2023   History of present illness [name] is a 58 y.o. female who presented with worsening SOB. Found to have PNA and CTA 7/30 w/ apparent splenic lacerations in the midpole region with surrounding subcapsular hematoma. No free fluid in the abdomen or pelvis. No active extravasation of contrast. Worsening respiratory status on 08/25/23, transferred to ICU and intubated. PMHx: obesity, depression, asthma, OSA, myasthenia gravis, chronic hypoxic hypercarbic respiratory failure on 2 L O2, remote history of PE (not on anticoagulation), HTN, hypothyroidism   OT comments  Patient received in supine and waking up this am. Patient appeared disoriented initially asking how she got here and believed it was Friday. Assistance provided to provide orientation and patient became more alert. Patient requiring min assist to get to EOB due to pain at right knee. Patient performed UB bathing and donned gown while seated on EOB before transfer to recliner. Patient demonstrating gains for sit to stand and transfers with min assist. Patient performed grooming seated in recliner and was provided setup for breakfast. Patient will benefit from intensive inpatient follow-up therapy, >3 hours/day.  Acute OT to continue to follow to address established goals to facilitate DC to next venue of care.         If plan is discharge home, recommend the following:  Assistance with cooking/housework;Direct supervision/assist for medications management;Direct supervision/assist for financial management;Assist for transportation;Help with stairs or ramp for entrance;A lot of help with walking and/or transfers;A lot of help with bathing/dressing/bathroom   Equipment Recommendations  Other (comment) (defer)    Recommendations for Other Services      Precautions / Restrictions Precautions Precautions:  Fall Recall of Precautions/Restrictions: Intact Restrictions Weight Bearing Restrictions Per Provider Order: No       Mobility Bed Mobility Overal bed mobility: Needs Assistance Bed Mobility: Supine to Sit     Supine to sit: HOB elevated, Used rails, Min assist     General bed mobility comments: increased time due to RLE knee pain and assistance with trunk    Transfers Overall transfer level: Needs assistance Equipment used: Rolling walker (2 wheels) Transfers: Sit to/from Stand, Bed to chair/wheelchair/BSC Sit to Stand: Min assist, From elevated surface     Step pivot transfers: Min assist     General transfer comment: cues for hand placement. On first attempt patient unable to come to complete stand. Bed was elevated and patient able to stand with min assist     Balance Overall balance assessment: Needs assistance Sitting-balance support: Feet supported Sitting balance-Leahy Scale: Fair Sitting balance - Comments: EOB   Standing balance support: Bilateral upper extremity supported, During functional activity, Reliant on assistive device for balance Standing balance-Leahy Scale: Poor Standing balance comment: reliant on RW for support due to RLE knee pain                           ADL either performed or assessed with clinical judgement   ADL Overall ADL's : Needs assistance/impaired Eating/Feeding: Set up;Sitting   Grooming: Wash/dry hands;Wash/dry face;Oral care;Set up;Sitting   Upper Body Bathing: Contact guard assist;Sitting Upper Body Bathing Details (indicate cue type and reason): on EOB Lower Body Bathing: Moderate assistance;Sit to/from stand Lower Body Bathing Details (indicate cue type and reason): assistance with peri area back Upper Body Dressing : Minimal assistance;Sitting Upper Body Dressing Details (indicate cue type and reason): gown Lower Body  Dressing: Maximal assistance;Sitting/lateral leans Lower Body Dressing Details (indicate  cue type and reason): socks                    Extremity/Trunk Assessment              Vision       Perception     Praxis     Communication Communication Communication: No apparent difficulties Factors Affecting Communication: Difficulty expressing self (soft speech)   Cognition Arousal: Alert Behavior During Therapy: WFL for tasks assessed/performed Cognition: No apparent impairments             OT - Cognition Comments: disoriented when first awaken but more cleared with increased time. Believed it to be Friday                 Following commands: Intact Following commands impaired: Follows one step commands with increased time      Cueing   Cueing Techniques: Verbal cues, Gestural cues, Tactile cues  Exercises      Shoulder Instructions       General Comments VSS on 2L O2 96% SpO2    Pertinent Vitals/ Pain       Pain Assessment Pain Assessment: Faces Faces Pain Scale: Hurts little more Pain Location: right knee Pain Descriptors / Indicators: Grimacing, Guarding, Aching Pain Intervention(s): Monitored during session, Repositioned  Home Living                                          Prior Functioning/Environment              Frequency  Min 2X/week        Progress Toward Goals  OT Goals(current goals can now be found in the care plan section)  Progress towards OT goals: Progressing toward goals  Acute Rehab OT Goals Patient Stated Goal: to go to rehab OT Goal Formulation: With patient Time For Goal Achievement: 09/16/23 Potential to Achieve Goals: Fair ADL Goals Pt Will Perform Grooming: with min assist;sitting Pt Will Perform Upper Body Dressing: with min assist;sitting Pt Will Perform Lower Body Dressing: with supervision;sit to/from stand Pt Will Transfer to Toilet: with supervision;ambulating Additional ADL Goal #1: Pt will complete will bed mobility with moderate assistance as a precursor to  ADLs. Additional ADL Goal #2: Pt will demonstrate fair sitting balance x 5 minutes in preparation for ADLs. Additional ADL Goal #3: Pt will stand with +2 mod assist as a precursor to toilet transfers.  Plan      Co-evaluation                 AM-PAC OT 6 Clicks Daily Activity     Outcome Measure   Help from another person eating meals?: A Little Help from another person taking care of personal grooming?: A Little Help from another person toileting, which includes using toliet, bedpan, or urinal?: A Lot Help from another person bathing (including washing, rinsing, drying)?: A Lot Help from another person to put on and taking off regular upper body clothing?: A Lot Help from another person to put on and taking off regular lower body clothing?: A Lot 6 Click Score: 14    End of Session Equipment Utilized During Treatment: Gait belt;Rolling walker (2 wheels);Oxygen   OT Visit Diagnosis: Muscle weakness (generalized) (M62.81)   Activity Tolerance Patient tolerated treatment well   Patient Left in chair;with call  bell/phone within reach;with chair alarm set   Nurse Communication Mobility status        Time: [phone] OT Time Calculation (min): 26 min  Charges: OT General Charges $OT Visit: 1 Visit OT Treatments $Self Care/Home Management : 23-37 mins  Dick Laine, OTA Acute Rehabilitation Services  Office [phone]   Jeb LITTIE Laine 09/06/2023, 1:55 PM

## 2023-09-06 NOTE — Plan of Care (Signed)

## 2023-09-06 NOTE — Progress Notes (Signed)
 Physical Therapy Treatment Patient Details Name: [name] MRN: [id number] DOB: March 02, 1965 Today's Date: 09/06/2023   History of Present Illness [name] is a 58 y.o. female who presented with worsening SOB. Found to have PNA and CTA 7/30 w/ apparent splenic lacerations in the midpole region with surrounding subcapsular hematoma. No free fluid in the abdomen or pelvis. No active extravasation of contrast. Worsening respiratory status on 08/25/23, transferred to ICU and intubated. PMHx: obesity, depression, asthma, OSA, myasthenia gravis, chronic hypoxic hypercarbic respiratory failure on 2 L O2, remote history of PE (not on anticoagulation), HTN, hypothyroidism    PT Comments  Pt seen for PT tx with pt agreeable, pleasant throughout session. Pt is able to complete transfers with supervision, cuing & good return demo for increased eccentric control during stand>sit. Pt ambulates 30 ft + 11 ft with RW & CGA, limited by R knee pain & SOB. Pt would benefit from ongoing skilled PT treatment to progress mobility & improve balance to reduce fall risk.    If plan is discharge home, recommend the following: Assistance with cooking/housework;Help with stairs or ramp for entrance;Assist for transportation;A lot of help with walking and/or transfers;A little help with bathing/dressing/bathroom   Can travel by private vehicle     Yes  Equipment Recommendations  Wheelchair cushion (measurements PT);Wheelchair (measurements PT);Hospital bed;Rolling walker (2 wheels);BSC/3in1    Recommendations for Other Services Rehab consult     Precautions / Restrictions Precautions Precautions: Fall Restrictions Weight Bearing Restrictions Per Provider Order: No     Mobility  Bed Mobility               General bed mobility comments: not tested, pt received & left sitting in recliner    Transfers Overall transfer level: Needs assistance Equipment used: Rolling walker (2  wheels) Transfers: Sit to/from Stand Sit to Stand: Supervision           General transfer comment: sit>stand from recliner with good ability to push to standing, poor eccentric control during stand>sit on 1st attempt but improved on 2nd attempt with cuing    Ambulation/Gait Ambulation/Gait assistance: Contact guard assist Gait Distance (Feet):  (30 + 11 ft) Assistive device: Rolling walker (2 wheels) Gait Pattern/deviations: Decreased step length - left, Decreased stride length, Decreased step length - right Gait velocity: variable but overall decreased     General Gait Details: R knee instability but no buckling, extra time when turning   Stairs             Wheelchair Mobility     Tilt Bed    Modified Rankin (Stroke Patients Only)       Balance Overall balance assessment: Needs assistance Sitting-balance support: Feet supported Sitting balance-Leahy Scale: Fair     Standing balance support: Bilateral upper extremity supported, During functional activity, Reliant on assistive device for balance Standing balance-Leahy Scale: Poor                              Communication Communication Communication: No apparent difficulties Factors Affecting Communication: Difficulty expressing self (soft speech)  Cognition Arousal: Alert Behavior During Therapy: WFL for tasks assessed/performed                           PT - Cognition Comments: decreased response at times Following commands: Intact Following commands impaired: Follows one step commands with increased time    Cueing Cueing Techniques:  Verbal cues, Gestural cues, Tactile cues  Exercises      General Comments General comments (skin integrity, edema, etc.): Pt on 2L/min via nasal cannula, SpO2 as low as 84% after gait but increases to 90% with seated rest break, PT providing cuing re: pursed lip breathing throughout session      Pertinent Vitals/Pain Pain Assessment Pain  Assessment: 0-10 Pain Score: 7  Pain Location: right knee Pain Descriptors / Indicators: Discomfort Pain Intervention(s): Monitored during session, Limited activity within patient's tolerance    Home Living                          Prior Function            PT Goals (current goals can now be found in the care plan section) Acute Rehab PT Goals Patient Stated Goal: to improve mobility PT Goal Formulation: With patient Time For Goal Achievement: 09/09/23 Potential to Achieve Goals: Good Progress towards PT goals: Progressing toward goals    Frequency    Min 2X/week      PT Plan      Co-evaluation              AM-PAC PT 6 Clicks Mobility   Outcome Measure  Help needed turning from your back to your side while in a flat bed without using bedrails?: A Lot Help needed moving from lying on your back to sitting on the side of a flat bed without using bedrails?: A Lot Help needed moving to and from a bed to a chair (including a wheelchair)?: A Little Help needed standing up from a chair using your arms (e.g., wheelchair or bedside chair)?: A Little Help needed to walk in hospital room?: A Lot Help needed climbing 3-5 steps with a railing? : Total 6 Click Score: 13    End of Session Equipment Utilized During Treatment: Oxygen  Activity Tolerance: Patient tolerated treatment well;Patient limited by fatigue;Patient limited by pain Patient left: in chair;with chair alarm set;with call bell/phone within reach   PT Visit Diagnosis: Other abnormalities of gait and mobility (R26.89);Muscle weakness (generalized) (M62.81);Difficulty in walking, not elsewhere classified (R26.2);Pain Pain - Right/Left: Right Pain - part of body: Knee     Time: 8670-8653 PT Time Calculation (min) (ACUTE ONLY): 17 min  Charges:    $Therapeutic Activity: 8-22 mins PT General Charges $$ ACUTE PT VISIT: 1 Visit                     Richerd Pinal, PT, DPT 09/06/23, 2:08  PM   Richerd CHRISTELLA Pinal 09/06/2023, 2:07 PM

## 2023-09-06 NOTE — H&P (Signed)
 Physical Medicine and Rehabilitation Admission H&P    Chief Complaint  Patient presents with   [facility], Bilateral Leg weakness  : HPI: [name] is a 58 year old right-handed female with history significant for myasthenia gravis, moderate persistent asthma, chronic hypoxic and hypercarbic respiratory failure on 2 L oxygen  nasal cannula,, OSA on CPAP, history of pulmonary emboli no longer on anticoagulation, hypertension, hypothyroidism, depression, 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  Per chart review patient lives with sister.  1 level home.  Modified independent for mobility and sister assist with ADLs.  Patient does report a fall 2-3 months ago.  Sister works during the day 8-5 and numerous other sisters close by with good support.  Presented 08/18/2023 with increasing shortness of breath/worse with exertion.  She reported 2 days of new cough productive of dark brown sputum.  Admission chemistries unremarkable except glucose 109, AST 59, WBC 16,200, troponin 130-123, BNP 105, blood culture no growth to date, urinalysis negative nitrite.  Chest x-ray cardiac enlargement with developing pulmonary vascular congestion suggestion of mild interstitial edema.  CT angio of the chest no definite evidence of PE.  CT abdomen pelvis indeterminant heterogeneous enhancement of the spleen with mild enlargement when compared to prior.  Splenic laceration and subcapsular fluid collections were suggested.  CTA angiogram of the abdomen pelvis showed markedly abnormal appearance of the spleen with apparent splenic laceration in the midpole region.  Surrounding subcapsular hematoma.  No free fluid in the abdomen or pelvis.  No active extravasation of contrast.  Bilateral lower lobe airspace opacities, left greater than right concerning for pneumonia.  She was started on ceftriaxone /doxycycline  as well as IV fluids.  Hospital course complicated by worsening mental status and respiratory failure with evidence of  myasthenia gravis exacerbation.  Neurology consulted and patient was eventually transferred to the ICU for intubation and airway protection and mechanical ventilation, treated with 5 days of IVIG and pyridostigmine , and extubated 8-10.  She was offered Plex therapy by neurology but refused.  Hospital course complicated by community-acquired pneumonia completing antibiotic course as well as bouts of hypernatremia felt to be secondary to poor oral intake improved 141.  Follow-up general surgery in regards to splenic laceration/hematoma recommending supportive care and hemoglobin remained stable at 10.7.  Patient with persistent right knee pain x-rays unremarkable conservative care provided.  Therapy evaluations completed due to patient decreased functional mobility was admitted for a comprehensive rehab program.  Review of Systems  HENT:  Negative for hearing loss.   Eyes:  Positive for double vision. Negative for blurred vision.  Respiratory:  Positive for cough, sputum production and shortness of breath. Negative for wheezing.   Cardiovascular:  Positive for leg swelling. Negative for chest pain and palpitations.  Gastrointestinal:  Positive for constipation. Negative for heartburn, nausea and vomiting.  Genitourinary:  Negative for dysuria, flank pain and hematuria.  Musculoskeletal:  Positive for joint pain, myalgias and neck pain.       Recent fall 2-3 months ago  Skin:  Negative for rash.  Neurological:  Positive for dizziness.  Psychiatric/Behavioral:  Positive for depression. The patient has insomnia.   All other systems reviewed and are negative.  Past Medical History:  Diagnosis Date   Asthma    Depression    Heart murmur    Myasthenia gravis (HCC)    Obesity    Pulmonary embolism (HCC)    Sleep apnea    Thyroid  disease    Past Surgical History:  Procedure Laterality Date  BREAST SURGERY  06/2019   breast reduction   THYROID  SURGERY     TONSILLECTOMY     TUBAL LIGATION      History reviewed. No pertinent family history. Social History:  reports that she has quit smoking. She has never used smokeless tobacco. She reports current alcohol use. She reports that she does not use drugs. Allergies:  Allergies  Allergen Reactions   Magnesium  Sulfate Other (See Comments)    Myasthenia gravis. Consider withholding replacement unless severely low.    Medications Prior to Admission  Medication Sig Dispense Refill   acetaminophen  (TYLENOL ) 500 MG tablet Take 500 mg by mouth every 6 (six) hours as needed for mild pain.     albuterol  (PROVENTIL ) (2.5 MG/3ML) 0.083% nebulizer solution Take 2.5 mg by nebulization every 6 (six) hours as needed for wheezing or shortness of breath.     albuterol  (VENTOLIN  HFA) 108 (90 Base) MCG/ACT inhaler Inhale 2 puffs into the lungs every 4 (four) hours as needed for wheezing or shortness of breath.      ARIPiprazole  (ABILIFY ) 2 MG tablet Take 2 mg by mouth daily.     cyanocobalamin (VITAMIN B12) 1000 MCG tablet Take 1 tablet by mouth daily.     famotidine  (PEPCID ) 20 MG tablet Take 1 tablet (20 mg total) by mouth 2 (two) times daily. 30 tablet 0   ferrous sulfate 324 MG TBEC Take 324 mg by mouth daily with breakfast.     levothyroxine  (SYNTHROID ) 200 MCG tablet Take 200 mcg by mouth daily before breakfast.     losartan  (COZAAR ) 50 MG tablet Take 50 mg by mouth daily.     mirtazapine  (REMERON ) 15 MG tablet Take 15 mg by mouth at bedtime.     montelukast  (SINGULAIR ) 10 MG tablet Take 10 mg by mouth at bedtime.     mycophenolate  (CELLCEPT ) 500 MG tablet Take 1,000 mg by mouth 2 (two) times daily.  0   pregabalin  (LYRICA ) 200 MG capsule Take 200 mg by mouth 2 (two) times daily.     pyridostigmine  (MESTINON ) 60 MG tablet Take 30 mg by mouth 5 (five) times daily.   0   Pyridoxine HCl (VITAMIN B6 PO) Take 1,000 mcg by mouth daily.        Home: Home Living Family/patient expects to be discharged to:: Private residence Living Arrangements:   (Lives with sister, [name]) Available Help at Discharge:  (57 yo Mom and mutiple sisters can assist at discharge) Type of Home: House Home Access: Stairs to enter Entrance Stairs-Rails: Right, Left (5 to 7 steps) Home Layout: One level Bathroom Shower/Tub: [employer] operational [employer] Accessibility: Yes Home Equipment: Other (comment) (home O2) Additional Comments: sister works 8-5; she has 6 other sisters near by  Lives With: Family (Sister, Engineer, drilling)   Functional History: Prior Function Prior Level of Function : Independent/Modified Independent Mobility Comments: indep, no AD, denies falls ADLs Comments: sister assists with IADLs  Functional Status:  Mobility: Bed Mobility Overal bed mobility: Needs Assistance Bed Mobility: Supine to Sit Rolling: Mod assist Supine to sit: HOB elevated, Used rails, Min assist Sit to supine: Max assist General bed mobility comments: pt up in chair on arrival and at end of session Transfers Overall transfer level: Needs assistance Equipment used: Rolling walker (2 wheels) Transfers: Sit to/from Stand, Bed to chair/wheelchair/BSC Sit to Stand: Contact guard assist Bed to/from chair/wheelchair/BSC transfer type:: Step pivot Step pivot transfers: Mod assist  Lateral/Scoot Transfers: Total assist General transfer comment: good recall  for hand placement with pt pushing up from armrests with bil UE, no pysical assist needed to rise Ambulation/Gait Ambulation/Gait assistance: Contact guard assist, +2 safety/equipment (chair follow) Gait Distance (Feet): 15 Feet Assistive device: Rolling walker (2 wheels) Gait Pattern/deviations: Step-to pattern, Decreased stride length, Shuffle, Trunk flexed General Gait Details: slow cuatious gait with RW for support, step to pattern due to RLE pain, trunk flexed throughout and heavy reliance on RW support, no LOB Gait velocity: decr Gait velocity interpretation: <1.31 ft/sec, indicative  of household ambulator Pre-gait activities: Attempted to have pt take lateral steps toward [facility], pt unable to offload each LE to march or take shuffled steps, pt sits impulsively due to fatigue/BLE weakness.    ADL: ADL Overall ADL's : Needs assistance/impaired Eating/Feeding: Set up, Sitting Grooming: Wash/dry hands, Wash/dry face, Oral care, Set up, Sitting Upper Body Bathing: Contact guard assist, Sitting Lower Body Bathing: Maximal assistance Upper Body Dressing : Minimal assistance, Sitting Lower Body Dressing: Total assistance Lower Body Dressing Details (indicate cue type and reason): socks Toilet Transfer: Total assistance, +2 for safety/equipment, +2 for physical assistance Toilet Transfer Details (indicate cue type and reason): simulated. Able to STS with mod A +2, unable to progress to stepping Toileting- Clothing Manipulation and Hygiene: Total assistance, +2 for physical assistance, +2 for safety/equipment, Sit to/from stand Functional mobility during ADLs: Moderate assistance, +2 for physical assistance, +2 for safety/equipment General ADL Comments: focused on sitting EOB and sit to stands  Cognition: Cognition Orientation Level: Oriented X4 Cognition Arousal: Alert Behavior During Therapy: WFL for tasks assessed/performed  Physical Exam: Blood pressure (!) 129/48, pulse 69, temperature 98.2 F (36.8 C), temperature source Oral, resp. rate 16, height 5' (1.524 m), weight 129 kg, SpO2 96%. Physical Exam Neurological:     Comments: Patient was alert sitting up in bed.  Makes eye contact with examiner.  She does answer basic questions as name age and place.  She does follow simple commands     No results found for this or any previous visit (from the past 48 hours). VAS US  LOWER EXTREMITY VENOUS (DVT) Result Date: 09/05/2023  Lower Venous DVT Study Patient Name:  ELVERTA DIMICELI Feijoo  Date of Exam:   09/04/2023 Medical Rec #: [id number]           Accession #:    [id number]  Date of Birth: 1965-12-14           Patient Gender: F Patient Age:   35 years Exam Location:  [facility] Procedure:      VAS US  LOWER EXTREMITY VENOUS (DVT) Referring Phys: RALPH NETTEY --------------------------------------------------------------------------------  Indications: Pain.  Risk Factors: Hx of PE, prolonged hospitalization/decreased mobility. Limitations: Body habitus and poor ultrasound/tissue interface. Comparison Study: Previous exam on 08/17/2016 was negative for DVT. Performing Technologist: Ezzie Potters RVT, RDMS  Examination Guidelines: A complete evaluation includes B-mode imaging, spectral Doppler, color Doppler, and power Doppler as needed of all accessible portions of each vessel. Bilateral testing is considered an integral part of a complete examination. Limited examinations for reoccurring indications may be performed as noted. The reflux portion of the exam is performed with the patient in reverse Trendelenburg.  +---------+---------------+---------+-----------+----------+--------------+ RIGHT    CompressibilityPhasicitySpontaneityPropertiesThrombus Aging +---------+---------------+---------+-----------+----------+--------------+ CFV      Full           Yes      Yes                                 +---------+---------------+---------+-----------+----------+--------------+  SFJ      Full                                                        +---------+---------------+---------+-----------+----------+--------------+ FV Prox  Full           Yes      Yes                                 +---------+---------------+---------+-----------+----------+--------------+ FV Mid   Full           Yes      Yes                                 +---------+---------------+---------+-----------+----------+--------------+ FV DistalFull           Yes      Yes                                 +---------+---------------+---------+-----------+----------+--------------+  PFV      Full                                                        +---------+---------------+---------+-----------+----------+--------------+ POP      Full           Yes      Yes                                 +---------+---------------+---------+-----------+----------+--------------+ PTV      Full                                                        +---------+---------------+---------+-----------+----------+--------------+ PERO     Full                                                        +---------+---------------+---------+-----------+----------+--------------+   +---------+---------------+---------+-----------+----------+--------------+ LEFT     CompressibilityPhasicitySpontaneityPropertiesThrombus Aging +---------+---------------+---------+-----------+----------+--------------+ CFV      Full           Yes      Yes                                 +---------+---------------+---------+-----------+----------+--------------+ SFJ      Full                                                        +---------+---------------+---------+-----------+----------+--------------+  FV Prox  Full           Yes      Yes                                 +---------+---------------+---------+-----------+----------+--------------+ FV Mid   Full           Yes      Yes                                 +---------+---------------+---------+-----------+----------+--------------+ FV DistalFull           Yes      Yes                                 +---------+---------------+---------+-----------+----------+--------------+ PFV      Full                                                        +---------+---------------+---------+-----------+----------+--------------+ POP      Full           Yes      Yes                                 +---------+---------------+---------+-----------+----------+--------------+ PTV      Full                                                         +---------+---------------+---------+-----------+----------+--------------+ PERO     Full                                                        +---------+---------------+---------+-----------+----------+--------------+     Summary: BILATERAL: - No evidence of deep vein thrombosis seen in the lower extremities, bilaterally. -No evidence of popliteal cyst, bilaterally.   *See table(s) above for measurements and observations. Electronically signed by Penne Colorado MD on 09/05/2023 at 11:04:26 AM.    Final       Blood pressure (!) 129/48, pulse 69, temperature 98.2 F (36.8 C), temperature source Oral, resp. rate 16, height 5' (1.524 m), weight 129 kg, SpO2 96%.  Medical Problem List and Plan: 1. Functional deficits secondary to debility secondary to exacerbation of myasthenia gravis.  Completed 5-day course of IVIG.  Continue Mestinon  30 mg 5 times daily as well as CellCept   -patient may *** shower  -ELOS/Goals: *** 2.  Antithrombotics: -DVT/anticoagulation:  Mechanical: Antiembolism stockings, thigh (TED hose) Bilateral lower extremities.  Venous Doppler studies negative  -antiplatelet therapy: N/A 3. Pain Management: Lyrica  200 mg twice daily, Voltaren  gel 4 times daily 4. Mood/Behavior/Sleep: Provide emotional support  -antipsychotic agents: Abilify  2 mg daily 5. Neuropsych/cognition: This patient is capable of making decisions on her own behalf. 6. Skin/Wound Care: Routine skin checks 7.  Fluids/Electrolytes/Nutrition: Routine in and outs with follow-up chemistries 8.  Acute on chronic respiratory failure with hypoxia and hypercarbia.  Extubated 8/10.  Continue BiPAP.  Continue nebulizers as directed.  Patient on 2 L O2 nasal cannula prior to admission 9.  Community-acquired pneumonia.  Antibiotic course completed.  Check oxygen  saturations every shift 10.  Peripheral neuropathy.  Continue Lyrica  200 mg twice daily as well as capsaicin  11.  Class IV obesity.   BMI 55.54.  Dietary follow-up 12.  Hypertension.  Cozaar  50 mg daily.  Monitor with increased mobility 13.  Hypothyroidism.  Synthroid  14.  Splenic laceration/hematoma.  Recommendations of supportive care per general surgery 15.  Normocytic anemia.  Follow-up CBC Toribio JINNY Pitch, PA-C 09/06/2023

## 2023-09-07 ENCOUNTER — Encounter (HOSPITAL_COMMUNITY): Payer: Self-pay | Admitting: Physical Medicine and Rehabilitation

## 2023-09-07 ENCOUNTER — Other Ambulatory Visit: Payer: Self-pay

## 2023-09-07 ENCOUNTER — Inpatient Hospital Stay (HOSPITAL_COMMUNITY)
Admission: AD | Admit: 2023-09-07 | Discharge: 2023-09-22 | DRG: 945 | Disposition: A | Discharge status: Home Health | Source: Intra-hospital | Attending: Physical Medicine and Rehabilitation | Admitting: Physical Medicine and Rehabilitation

## 2023-09-07 DIAGNOSIS — N950 Postmenopausal bleeding: Secondary | ICD-10-CM | POA: Diagnosis not present

## 2023-09-07 DIAGNOSIS — R9389 Abnormal findings on diagnostic imaging of other specified body structures: Secondary | ICD-10-CM | POA: Diagnosis present

## 2023-09-07 DIAGNOSIS — H5010 Unspecified exotropia: Secondary | ICD-10-CM | POA: Diagnosis present

## 2023-09-07 DIAGNOSIS — Z8701 Personal history of pneumonia (recurrent): Secondary | ICD-10-CM

## 2023-09-07 DIAGNOSIS — G4733 Obstructive sleep apnea (adult) (pediatric): Secondary | ICD-10-CM | POA: Diagnosis present

## 2023-09-07 DIAGNOSIS — E039 Hypothyroidism, unspecified: Secondary | ICD-10-CM | POA: Diagnosis present

## 2023-09-07 DIAGNOSIS — R531 Weakness: Secondary | ICD-10-CM | POA: Diagnosis present

## 2023-09-07 DIAGNOSIS — E66813 Obesity, class 3: Secondary | ICD-10-CM | POA: Diagnosis present

## 2023-09-07 DIAGNOSIS — F32A Depression, unspecified: Secondary | ICD-10-CM | POA: Diagnosis present

## 2023-09-07 DIAGNOSIS — R5381 Other malaise: Principal | ICD-10-CM | POA: Diagnosis present

## 2023-09-07 DIAGNOSIS — Z6841 Body Mass Index (BMI) 40.0 and over, adult: Secondary | ICD-10-CM | POA: Diagnosis not present

## 2023-09-07 DIAGNOSIS — J811 Chronic pulmonary edema: Secondary | ICD-10-CM | POA: Diagnosis present

## 2023-09-07 DIAGNOSIS — Z888 Allergy status to other drugs, medicaments and biological substances status: Secondary | ICD-10-CM

## 2023-09-07 DIAGNOSIS — J9602 Acute respiratory failure with hypercapnia: Secondary | ICD-10-CM

## 2023-09-07 DIAGNOSIS — A419 Sepsis, unspecified organism: Secondary | ICD-10-CM | POA: Diagnosis not present

## 2023-09-07 DIAGNOSIS — R710 Precipitous drop in hematocrit: Secondary | ICD-10-CM | POA: Diagnosis not present

## 2023-09-07 DIAGNOSIS — Z87891 Personal history of nicotine dependence: Secondary | ICD-10-CM

## 2023-09-07 DIAGNOSIS — G621 Alcoholic polyneuropathy: Secondary | ICD-10-CM | POA: Diagnosis present

## 2023-09-07 DIAGNOSIS — J90 Pleural effusion, not elsewhere classified: Secondary | ICD-10-CM | POA: Diagnosis present

## 2023-09-07 DIAGNOSIS — R09A2 Foreign body sensation, throat: Secondary | ICD-10-CM | POA: Diagnosis present

## 2023-09-07 DIAGNOSIS — Z72820 Sleep deprivation: Secondary | ICD-10-CM | POA: Diagnosis not present

## 2023-09-07 DIAGNOSIS — J4540 Moderate persistent asthma, uncomplicated: Secondary | ICD-10-CM | POA: Diagnosis present

## 2023-09-07 DIAGNOSIS — H6121 Impacted cerumen, right ear: Secondary | ICD-10-CM | POA: Diagnosis not present

## 2023-09-07 DIAGNOSIS — Z79899 Other long term (current) drug therapy: Secondary | ICD-10-CM

## 2023-09-07 DIAGNOSIS — G7000 Myasthenia gravis without (acute) exacerbation: Principal | ICD-10-CM | POA: Diagnosis present

## 2023-09-07 DIAGNOSIS — M1711 Unilateral primary osteoarthritis, right knee: Secondary | ICD-10-CM | POA: Diagnosis present

## 2023-09-07 DIAGNOSIS — J9611 Chronic respiratory failure with hypoxia: Secondary | ICD-10-CM | POA: Diagnosis present

## 2023-09-07 DIAGNOSIS — Z9981 Dependence on supplemental oxygen: Secondary | ICD-10-CM | POA: Diagnosis not present

## 2023-09-07 DIAGNOSIS — J189 Pneumonia, unspecified organism: Secondary | ICD-10-CM

## 2023-09-07 DIAGNOSIS — D72829 Elevated white blood cell count, unspecified: Secondary | ICD-10-CM

## 2023-09-07 DIAGNOSIS — G609 Hereditary and idiopathic neuropathy, unspecified: Secondary | ICD-10-CM | POA: Diagnosis not present

## 2023-09-07 DIAGNOSIS — Z86711 Personal history of pulmonary embolism: Secondary | ICD-10-CM

## 2023-09-07 DIAGNOSIS — E669 Obesity, unspecified: Secondary | ICD-10-CM | POA: Diagnosis present

## 2023-09-07 DIAGNOSIS — D259 Leiomyoma of uterus, unspecified: Secondary | ICD-10-CM | POA: Diagnosis present

## 2023-09-07 DIAGNOSIS — I10 Essential (primary) hypertension: Secondary | ICD-10-CM | POA: Diagnosis not present

## 2023-09-07 DIAGNOSIS — I119 Hypertensive heart disease without heart failure: Secondary | ICD-10-CM | POA: Diagnosis present

## 2023-09-07 DIAGNOSIS — J9612 Chronic respiratory failure with hypercapnia: Secondary | ICD-10-CM | POA: Diagnosis present

## 2023-09-07 DIAGNOSIS — J9620 Acute and chronic respiratory failure, unspecified whether with hypoxia or hypercapnia: Secondary | ICD-10-CM | POA: Diagnosis not present

## 2023-09-07 DIAGNOSIS — J9601 Acute respiratory failure with hypoxia: Secondary | ICD-10-CM

## 2023-09-07 DIAGNOSIS — S36039D Unspecified laceration of spleen, subsequent encounter: Secondary | ICD-10-CM

## 2023-09-07 DIAGNOSIS — H02401 Unspecified ptosis of right eyelid: Secondary | ICD-10-CM | POA: Diagnosis present

## 2023-09-07 DIAGNOSIS — Z79624 Long term (current) use of inhibitors of nucleotide synthesis: Secondary | ICD-10-CM

## 2023-09-07 DIAGNOSIS — Z79890 Hormone replacement therapy (postmenopausal): Secondary | ICD-10-CM

## 2023-09-07 MED ORDER — POLYETHYLENE GLYCOL 3350 17 G PO PACK
17.0000 g | PACK | Freq: Every day | ORAL | Status: DC
  Administered 2023-09-08 – 2023-09-16 (×8): 17 g via ORAL
  Filled 2023-09-07 (×9): qty 1

## 2023-09-07 MED ORDER — CARMEX CLASSIC LIP BALM EX OINT
TOPICAL_OINTMENT | CUTANEOUS | Status: DC | PRN
Start: 2023-09-07 — End: 2023-09-22

## 2023-09-07 MED ORDER — CAPSAICIN 0.025 % EX CREA
TOPICAL_CREAM | Freq: Two times a day (BID) | CUTANEOUS | Status: DC
  Filled 2023-09-07: qty 60

## 2023-09-07 MED ORDER — PREGABALIN 50 MG PO CAPS
200.0000 mg | ORAL_CAPSULE | Freq: Two times a day (BID) | ORAL | Status: DC
  Administered 2023-09-07 – 2023-09-22 (×30): 200 mg via ORAL
  Filled 2023-09-07 (×29): qty 4

## 2023-09-07 MED ORDER — MONTELUKAST SODIUM 10 MG PO TABS
10.0000 mg | ORAL_TABLET | Freq: Every day | ORAL | Status: DC
  Administered 2023-09-07 – 2023-09-21 (×15): 10 mg via ORAL
  Filled 2023-09-07 (×15): qty 1

## 2023-09-07 MED ORDER — ALBUTEROL SULFATE (2.5 MG/3ML) 0.083% IN NEBU
2.5000 mg | INHALATION_SOLUTION | Freq: Two times a day (BID) | RESPIRATORY_TRACT | Status: DC
Start: 2023-09-07 — End: 2023-09-13
  Administered 2023-09-07 – 2023-09-13 (×10): 2.5 mg via RESPIRATORY_TRACT
  Filled 2023-09-07 (×13): qty 3

## 2023-09-07 MED ORDER — BUDESONIDE 0.5 MG/2ML IN SUSP
0.5000 mg | Freq: Two times a day (BID) | RESPIRATORY_TRACT | Status: DC
Start: 2023-09-07 — End: 2023-09-22
  Administered 2023-09-07 – 2023-09-22 (×27): 0.5 mg via RESPIRATORY_TRACT
  Filled 2023-09-07 (×31): qty 2

## 2023-09-07 MED ORDER — LEVOTHYROXINE SODIUM 100 MCG PO TABS
200.0000 ug | ORAL_TABLET | Freq: Every day | ORAL | Status: DC
  Administered 2023-09-08 – 2023-09-22 (×15): 200 ug via ORAL
  Filled 2023-09-07 (×15): qty 2

## 2023-09-07 MED ORDER — POLYETHYLENE GLYCOL 3350 17 G PO PACK: 17.0000 g | PACK | Freq: Every day | ORAL | Status: DC

## 2023-09-07 MED ORDER — DICLOFENAC SODIUM 1 % EX GEL
4.0000 g | Freq: Four times a day (QID) | CUTANEOUS | Status: DC
Start: 2023-09-07 — End: 2023-09-22
  Administered 2023-09-07 – 2023-09-22 (×48): 4 g via TOPICAL
  Filled 2023-09-07: qty 100

## 2023-09-07 MED ORDER — PYRIDOSTIGMINE BROMIDE 60 MG PO TABS
30.0000 mg | ORAL_TABLET | Freq: Every day | ORAL | Status: DC
  Administered 2023-09-07 – 2023-09-22 (×74): 30 mg via ORAL
  Filled 2023-09-07 (×76): qty 0.5

## 2023-09-07 MED ORDER — MYCOPHENOLATE MOFETIL 250 MG PO CAPS
1000.0000 mg | ORAL_CAPSULE | Freq: Two times a day (BID) | ORAL | Status: DC
Start: 2023-09-07 — End: 2023-09-22
  Administered 2023-09-07 – 2023-09-22 (×30): 1000 mg via ORAL
  Filled 2023-09-07 (×31): qty 4

## 2023-09-07 MED ORDER — MUPIROCIN CALCIUM 2 % EX CREA
TOPICAL_CREAM | Freq: Two times a day (BID) | CUTANEOUS | Status: DC
  Filled 2023-09-07: qty 15

## 2023-09-07 MED ORDER — ARIPIPRAZOLE 2 MG PO TABS
2.0000 mg | ORAL_TABLET | Freq: Every day | ORAL | Status: DC
Start: 2023-09-08 — End: 2023-09-22
  Administered 2023-09-08 – 2023-09-22 (×15): 2 mg via ORAL
  Filled 2023-09-07 (×15): qty 1

## 2023-09-07 MED ORDER — ALBUTEROL SULFATE (2.5 MG/3ML) 0.083% IN NEBU
2.5000 mg | INHALATION_SOLUTION | Freq: Four times a day (QID) | RESPIRATORY_TRACT | Status: DC | PRN
Start: 2023-09-07 — End: 2023-09-22
  Filled 2023-09-07: qty 3

## 2023-09-07 MED ORDER — PANTOPRAZOLE SODIUM 40 MG PO TBEC
40.0000 mg | DELAYED_RELEASE_TABLET | Freq: Every day | ORAL | Status: DC
  Administered 2023-09-08 – 2023-09-22 (×15): 40 mg via ORAL
  Filled 2023-09-07 (×15): qty 1

## 2023-09-07 MED ORDER — DICLOFENAC SODIUM 1 % EX GEL: 4.0000 g | Freq: Four times a day (QID) | CUTANEOUS | Status: DC

## 2023-09-07 MED ORDER — ACETAMINOPHEN 325 MG PO TABS: 650.0000 mg | ORAL_TABLET | Freq: Four times a day (QID) | ORAL | Status: DC | PRN

## 2023-09-07 MED ORDER — ONDANSETRON HCL 4 MG PO TABS
4.0000 mg | ORAL_TABLET | Freq: Four times a day (QID) | ORAL | Status: DC | PRN
  Administered 2023-09-16 – 2023-09-17 (×2): 4 mg via ORAL
  Filled 2023-09-07 (×2): qty 1

## 2023-09-07 MED ORDER — ONDANSETRON HCL 4 MG/2ML IJ SOLN: 4.0000 mg | Freq: Four times a day (QID) | INTRAMUSCULAR | Status: DC | PRN

## 2023-09-07 MED ORDER — ORAL CARE MOUTH RINSE
15.0000 mL | OROMUCOSAL | Status: DC
  Administered 2023-09-07 – 2023-09-22 (×50): 15 mL via OROMUCOSAL

## 2023-09-07 MED ORDER — CAPSAICIN 0.025 % EX CREA: TOPICAL_CREAM | Freq: Two times a day (BID) | CUTANEOUS | Status: DC

## 2023-09-07 MED ORDER — ORAL CARE MOUTH RINSE: 15.0000 mL | OROMUCOSAL | Status: DC | PRN

## 2023-09-07 MED ORDER — ACETAMINOPHEN 650 MG RE SUPP: 650.0000 mg | Freq: Four times a day (QID) | RECTAL | Status: DC | PRN

## 2023-09-07 NOTE — Discharge Summary (Signed)
 Physician Discharge Summary   Patient: [name] MRN: [id number] DOB: Apr 30, 1965  Admit date:     08/18/2023  Discharge date: 09/07/23  Discharge Physician: Elgin Lam, MD   PCP: Minerva Rosina HERO, MD   Recommendations at discharge:  PCP visit for hospital follow-up Hold losartan  for hypotension; resume as tolerated BMP and CBC  Discharge Diagnoses: Principal Problem:   Sepsis due to pneumonia [facility]) Active Problems:   Acute on chronic respiratory failure with hypoxia and hypercapnia (HCC)   Elevated troponin   Depression   Myasthenia gravis (HCC)   Sleep apnea   Morbid obesity (HCC)   Hypothyroidism   Thrombocytopenia (HCC)   Moderate persistent asthma   Splenic laceration, initial encounter   Acute respiratory failure with hypoxia and hypercapnia (HCC)  Resolved Problems:   * No resolved hospital problems. *  Hospital Course: [name] is a 58 y.o. female with a history of [place] gravis, moderate persistent asthma, chronic respite failure with hypoxia and hypercarbia on 2 L/min of oxygen , pulmonary embolism, hypertension, hypothyroidism, depression, morbid obesity, OSA on CPAP.  Patient presented secondary to shortness of breath and was found to have evidence of an acquired pneumonia and started on antibiotics.  During hospitalization, patient developed worsening mental status and respiratory status with evidence of myasthenia gravis exacerbation.  Neurology was consulted.  Patient was initially managed on BiPAP but eventually required transfer to ICU for intubation and mechanical ventilation.  Patient treated with 5 days of IVIG with improvement in symptoms.  Assessment and Plan:  Myasthenia gravis exacerbation Occurred during hospitalization.  Neurology consulted.  Patient found to have hypercarbia and was placed on BiPAP.  Pulmonary critical care medicine was consulted and patient was transferred to ICU and eventually intubated.  Patient was treated with  IVIG for total of 5 days by neurology with improvement of symptoms. Continue pyridostigmine .   Community acquired pneumonia Present on admission. Patient started empirically on ceftriaxone  and doxycycline .  Patient completed course.   Acute on chronic respiratory failure with hypoxia and hypercarbia Acute respite failure secondary to pneumonia in addition to myasthenia gravis exacerbation.  Complicated by underlying OSA/OHS and asthma.  Patient was initially managed on BiPAP for hypercarbia, however she was subsequently intubated on 8/6 for mechanical ventilation in the ICU.  Patient was successfully extubated on 8/10. Wean to 2 L/min of oxygen  at rehab.   Right knee arthritis New problem. Not related to recent ambulation, per patient. No known injury. X-ray significant for arthritis and small effusion. Pain has improved with ice pack and Voltaren  gel. Continue.   Peripheral neuropathy Vitamin B12 elevated. Patient started on Lyrica . LE venous duplex scan negative for DVT. Continue Lyrica  and capsaicin  cream.   Hypernatremia Mild. Likely related to poor oral intake. Appears to be resolved.   Hypokalemia Mild. Resolved with potassium supplementation.   Hyperkalemia Resolved.   Primary hypertension Hold losartan  secondary to hypotension.   Hypothyroidism Continue Synthroid    Depression Anxiety Continue Abilify    Splenic laceration with hematoma Noted on CT imaging.  Unclear cause.  Patient does note history of fall about 2 to 3 months ago.  General surgery was consulted this admission and recommended supportive care.   Normocytic anemia Noted. Hemoglobin stable.   OSA Continue CPAP at bedtime   Obesity, class III Estimated body mass index is 55.54 kg/m as calculated from the following:   Height as of this encounter: 5' (1.524 m).   Weight as of this encounter: 129 kg.   Consultants:  General surgery Neurology PCCM   Procedures:  Intubation/extubation Cortrak  feeding tube placement  Disposition: Rehabilitation facility Diet recommendation: Cardiac diet   DISCHARGE MEDICATION: Allergies as of 09/07/2023       Reactions   Magnesium  Sulfate Other (See Comments)   Myasthenia gravis. Consider withholding replacement unless severely low.         Medication List     PAUSE taking these medications    losartan  50 MG tablet Wait to take this until your doctor or other care provider tells you to start again. Commonly known as: COZAAR  Take 50 mg by mouth daily.       TAKE these medications    acetaminophen  500 MG tablet Commonly known as: TYLENOL  Take 500 mg by mouth every 6 (six) hours as needed for mild pain.   ARIPiprazole  2 MG tablet Commonly known as: ABILIFY  Take 2 mg by mouth daily.   capsaicin  0.025 % cream Commonly known as: ZOSTRIX Apply topically 2 (two) times daily.   cyanocobalamin 1000 MCG tablet Commonly known as: VITAMIN B12 Take 1 tablet by mouth daily.   diclofenac  Sodium 1 % Gel Commonly known as: VOLTAREN  Apply 4 g topically 4 (four) times daily.   famotidine  20 MG tablet Commonly known as: PEPCID  Take 1 tablet (20 mg total) by mouth 2 (two) times daily.   ferrous sulfate 324 MG Tbec Take 324 mg by mouth daily with breakfast.   levothyroxine  200 MCG tablet Commonly known as: SYNTHROID  Take 200 mcg by mouth daily before breakfast.   mirtazapine  15 MG tablet Commonly known as: REMERON  Take 15 mg by mouth at bedtime.   montelukast  10 MG tablet Commonly known as: SINGULAIR  Take 10 mg by mouth at bedtime.   mycophenolate  500 MG tablet Commonly known as: CELLCEPT  Take 1,000 mg by mouth 2 (two) times daily.   polyethylene glycol 17 g packet Commonly known as: MIRALAX  / GLYCOLAX  Take 17 g by mouth daily. Start taking on: September 08, 2023   pregabalin  200 MG capsule Commonly known as: LYRICA  Take 200 mg by mouth 2 (two) times daily.   pyridostigmine  60 MG tablet Commonly known as:  MESTINON  Take 30 mg by mouth 5 (five) times daily.   albuterol  (2.5 MG/3ML) 0.083% nebulizer solution Commonly known as: PROVENTIL  Take 2.5 mg by nebulization every 6 (six) hours as needed for wheezing or shortness of breath.   Ventolin  HFA 108 (90 Base) MCG/ACT inhaler Generic drug: albuterol  Inhale 2 puffs into the lungs every 4 (four) hours as needed for wheezing or shortness of breath.   VITAMIN B6 PO Take 1,000 mcg by mouth daily.        Follow-up Information     Minerva Rosina HERO, MD. Schedule an appointment as soon as possible for a visit.   Specialty: Family Medicine Why: For hospital follow-up Contact information: [id number] [facility] [address] [phone]                Discharge Exam: BP (!) 106/41 (BP Location: Right Arm)   Pulse 79   Temp 98.3 F (36.8 C)   Resp 18   Ht 5' (1.524 m)   Wt 129 kg   SpO2 97%   BMI 55.54 kg/m   General exam: Appears calm and comfortable Respiratory system: Respiratory effort normal. Central nervous system: Alert and oriented. Psychiatry: Judgement and insight appear normal. Mood & affect appropriate.   Condition at discharge: stable  The results of significant diagnostics  from this hospitalization (including imaging, microbiology, ancillary and laboratory) are listed below for reference.   Imaging Studies: VAS US  LOWER EXTREMITY VENOUS (DVT) Result Date: 09/05/2023  Lower Venous DVT Study Patient Name:  EILEY MCGINNITY Clucas  Date of Exam:   09/04/2023 Medical Rec #: [id number]           Accession #:    [id number] Date of Birth: 1965/03/27           Patient Gender: F Patient Age:   45 years Exam Location:  [facility] Procedure:      VAS US  LOWER EXTREMITY VENOUS (DVT) Referring Phys: Zackrey Dyar --------------------------------------------------------------------------------  Indications: Pain.  Risk Factors: Hx of PE, prolonged hospitalization/decreased mobility.  Limitations: Body habitus and poor ultrasound/tissue interface. Comparison Study: Previous exam on 08/17/2016 was negative for DVT. Performing Technologist: Ezzie Potters RVT, RDMS  Examination Guidelines: A complete evaluation includes B-mode imaging, spectral Doppler, color Doppler, and power Doppler as needed of all accessible portions of each vessel. Bilateral testing is considered an integral part of a complete examination. Limited examinations for reoccurring indications may be performed as noted. The reflux portion of the exam is performed with the patient in reverse Trendelenburg.  +---------+---------------+---------+-----------+----------+--------------+ RIGHT    CompressibilityPhasicitySpontaneityPropertiesThrombus Aging +---------+---------------+---------+-----------+----------+--------------+ CFV      Full           Yes      Yes                                 +---------+---------------+---------+-----------+----------+--------------+ SFJ      Full                                                        +---------+---------------+---------+-----------+----------+--------------+ FV Prox  Full           Yes      Yes                                 +---------+---------------+---------+-----------+----------+--------------+ FV Mid   Full           Yes      Yes                                 +---------+---------------+---------+-----------+----------+--------------+ FV DistalFull           Yes      Yes                                 +---------+---------------+---------+-----------+----------+--------------+ PFV      Full                                                        +---------+---------------+---------+-----------+----------+--------------+ POP      Full           Yes      Yes                                 +---------+---------------+---------+-----------+----------+--------------+  PTV      Full                                                         +---------+---------------+---------+-----------+----------+--------------+ PERO     Full                                                        +---------+---------------+---------+-----------+----------+--------------+   +---------+---------------+---------+-----------+----------+--------------+ LEFT     CompressibilityPhasicitySpontaneityPropertiesThrombus Aging +---------+---------------+---------+-----------+----------+--------------+ CFV      Full           Yes      Yes                                 +---------+---------------+---------+-----------+----------+--------------+ SFJ      Full                                                        +---------+---------------+---------+-----------+----------+--------------+ FV Prox  Full           Yes      Yes                                 +---------+---------------+---------+-----------+----------+--------------+ FV Mid   Full           Yes      Yes                                 +---------+---------------+---------+-----------+----------+--------------+ FV DistalFull           Yes      Yes                                 +---------+---------------+---------+-----------+----------+--------------+ PFV      Full                                                        +---------+---------------+---------+-----------+----------+--------------+ POP      Full           Yes      Yes                                 +---------+---------------+---------+-----------+----------+--------------+ PTV      Full                                                        +---------+---------------+---------+-----------+----------+--------------+  PERO     Full                                                        +---------+---------------+---------+-----------+----------+--------------+     Summary: BILATERAL: - No evidence of deep vein thrombosis seen in the lower extremities, bilaterally. -No  evidence of popliteal cyst, bilaterally.   *See table(s) above for measurements and observations. Electronically signed by Penne Colorado MD on 09/05/2023 at 11:04:26 AM.    Final    DG Knee Right Port Result Date: 09/02/2023 CLINICAL DATA:  Right knee pain. EXAM: PORTABLE RIGHT KNEE - 1-2 VIEW COMPARISON:  12/26/2017 FINDINGS: AP and lateral views of the knee. The osteochondral lesion in the medial femoral condyle on prior exam is not seen, possibly due to positioning. No evidence of acute fracture. Normal alignment. Moderate patellofemoral osteoarthritis. Minimal joint effusion. Mild soft tissue edema. IMPRESSION: Negative. Electronically Signed   By: Andrea Gasman M.D.   On: 09/02/2023 16:00   DG CHEST PORT 1 VIEW Result Date: 08/27/2023 EXAM: 1 VIEW XRAY OF THE CHEST 08/27/2023 09:03:00 AM COMPARISON: None available. CLINICAL HISTORY: Lung collapse. Patient has history of asthma. FINDINGS: LUNGS AND PLEURA: Low lung volumes with mild infiltrates or edema, left worse than right. However, left lung aeration is significantly improved since the previous exam. Suspect small to moderate left pleural effusion. HEART AND MEDIASTINUM: No acute abnormality of the cardiac and mediastinal silhouettes. Sternotomy wires. BONES AND SOFT TISSUES: No acute osseous abnormality. Stable endotracheal tube and gastric tube. Left arm PICC line with tip in the proximal right atrium. IMPRESSION: 1. Low lung volumes with mild infiltrates or edema, left worse than right, and suspect small to moderate left pleural effusion. 2. Left lung aeration is significantly improved since the previous exam. Electronically signed by: Katheleen Faes MD 08/27/2023 09:16 AM EDT RP Workstation: [id number]   Portable Chest x-ray Result Date: 08/25/2023 CLINICAL DATA:  Respiratory failure. EXAM: PORTABLE CHEST 1 VIEW COMPARISON:  Same day. FINDINGS: Endotracheal and nasogastric tubes are in grossly good position. Sternotomy wires are noted. There is  now noted complete opacification of the left hemithorax most likely due to pneumonia, edema, atelectasis and/or effusion. No significant abnormality seen in the right lung. Bony thorax is unremarkable. IMPRESSION: Support apparatus as noted above. Complete opacification of left hemithorax is now noted most likely due to pneumonia, edema, atelectasis and/or pleural effusion. Electronically Signed   By: Lynwood Landy Raddle M.D.   On: 08/25/2023 13:34   DG Abd Portable 1V Result Date: 08/25/2023 CLINICAL DATA:  Nasogastric tube placement. EXAM: PORTABLE ABDOMEN - 1 VIEW COMPARISON:  April 20, 2023. FINDINGS: Distal tip of nasogastric tube is either in the distal stomach or proximal duodenum. IMPRESSION: Nasogastric tube tip seen in either distal stomach or proximal duodenum. Electronically Signed   By: Lynwood Landy Raddle M.D.   On: 08/25/2023 13:31   DG CHEST PORT 1 VIEW Result Date: 08/25/2023 CLINICAL DATA:  Shortness of breath. EXAM: PORTABLE CHEST 1 VIEW COMPARISON:  August 18, 2023. FINDINGS: Stable cardiomegaly. Mild central pulmonary vascular congestion is noted. Sternotomy wires are noted. Left basilar atelectasis or infiltrate is noted with small pleural effusion. Bony thorax is unremarkable. IMPRESSION: Stable cardiomegaly with mild central pulmonary vascular congestion. Left basilar atelectasis or infiltrate is noted with small pleural effusion. Electronically Signed  By: Lynwood Landy Raddle M.D.   On: 08/25/2023 13:31   US  EKG SITE RITE Result Date: 08/25/2023 If Site Rite image not attached, placement could not be confirmed due to current cardiac rhythm.  VAS US  ABI WITH/WO TBI Result Date: 08/24/2023  LOWER EXTREMITY DOPPLER STUDY Patient Name:  ALETHEA TERHAAR  Date of Exam:   08/23/2023 Medical Rec #: [id number]           Accession #:    [id number] Date of Birth: 1965/03/27           Patient Gender: F Patient Age:   60 years Exam Location:  [facility] Procedure:      VAS US  ABI WITH/WO TBI Referring  Phys: DELILIAH RASHID --------------------------------------------------------------------------------  Indications: Bilateral leg and foot pain High Risk Factors: Hypertension. Other Factors: Splenic laceration, obstructive sleep apnea, Myasthenia Gravis,                Morbid obesity (BMI 70).  Comparison Study: No prior study on file Performing Technologist: Rachel Pellet RVS  Examination Guidelines: A complete evaluation includes at minimum, Doppler waveform signals and systolic blood pressure reading at the level of bilateral brachial, anterior tibial, and posterior tibial arteries, when vessel segments are accessible. Bilateral testing is considered an integral part of a complete examination. Photoelectric Plethysmograph (PPG) waveforms and toe systolic pressure readings are included as required and additional duplex testing as needed. Limited examinations for reoccurring indications may be performed as noted.  ABI Findings: +---------+------------------+-----+-----------+--------+ Right    Rt Pressure (mmHg)IndexWaveform   Comment  +---------+------------------+-----+-----------+--------+ PTA      150               1.06 multiphasic         +---------+------------------+-----+-----------+--------+ DP       179               1.26 multiphasic         +---------+------------------+-----+-----------+--------+ Great Toe132               0.93 Normal              +---------+------------------+-----+-----------+--------+ +---------+------------------+-----+-----------+-------+ Left     Lt Pressure (mmHg)IndexWaveform   Comment +---------+------------------+-----+-----------+-------+ Brachial 142                    triphasic          +---------+------------------+-----+-----------+-------+ PTA      161               1.13 multiphasic        +---------+------------------+-----+-----------+-------+ DP       166               1.17 multiphasic         +---------+------------------+-----+-----------+-------+ Great Toe85                0.60 Abnormal           +---------+------------------+-----+-----------+-------+ +-------+-----------+-----------+------------+------------+ ABI/TBIToday's ABIToday's TBIPrevious ABIPrevious TBI +-------+-----------+-----------+------------+------------+ Right  1.26       0.93                                +-------+-----------+-----------+------------+------------+ Left   1.17       0.60                                +-------+-----------+-----------+------------+------------+  Summary: Right: Resting right ankle-brachial index is within normal range. The right toe-brachial index is normal. Left: Resting left ankle-brachial index is within normal range. The left toe-brachial index is abnormal. *See table(s) above for measurements and observations.  Electronically signed by Norman Serve on 08/24/2023 at 9:20:52 AM.    Final    DG Abd 1 View Result Date: 08/20/2023 CLINICAL DATA:  [id number] Vomiting [id number] EXAM: ABDOMEN - 1 VIEW COMPARISON:  None Available. FINDINGS: Nonobstructive bowel gas pattern. No pneumoperitoneum. Hepatomegaly. No radiopaque calculi visualized. No acute fracture or destructive lesion. The lung bases are clear. IMPRESSION: 1. Nonobstructive bowel gas pattern. 2. Hepatomegaly. Electronically Signed   By: Rogelia Myers M.D.   On: 08/20/2023 11:30   CT Angio Abd/Pel w/ and/or w/o Result Date: 08/18/2023 CLINICAL DATA:  Abnormal spleen EXAM: CTA ABDOMEN AND PELVIS WITHOUT AND WITH CONTRAST TECHNIQUE: Multidetector CT imaging of the abdomen and pelvis was performed using the standard protocol during bolus administration of intravenous contrast. Multiplanar reconstructed images and MIPs were obtained and reviewed to evaluate the vascular anatomy. RADIATION DOSE REDUCTION: This exam was performed according to the departmental dose-optimization program which includes automated exposure  control, adjustment of the mA and/or kV according to patient size and/or use of iterative reconstruction technique. CONTRAST:  OMNIPAQUE  IOHEXOL  350 MG/ML SOLN COMPARISON:  CT earlier today. FINDINGS: VASCULAR Aorta: Normal caliber aorta without aneurysm, dissection, vasculitis or significant stenosis. Celiac: Patent without evidence of aneurysm, dissection, vasculitis or significant stenosis. SMA: Patent without evidence of aneurysm, dissection, vasculitis or significant stenosis. Renals: Both renal arteries are patent without evidence of aneurysm, dissection, vasculitis, fibromuscular dysplasia or significant stenosis. IMA: Patent without evidence of aneurysm, dissection, vasculitis or significant stenosis. Inflow: Patent without evidence of aneurysm, dissection, vasculitis or significant stenosis. Proximal Outflow: Bilateral common femoral and visualized portions of the superficial and profunda femoral arteries are patent without evidence of aneurysm, dissection, vasculitis or significant stenosis. Veins: No obvious venous abnormality within the limitations of this arterial phase study. Review of the MIP images confirms the above findings. NON-VASCULAR Lower chest: Bilateral lower lobe airspace opacities, left greater than right. No effusions. Hepatobiliary: Liver is enlarged. No focal liver lesion. Gallbladder unremarkable. No biliary ductal dilatation. Pancreas: No focal abnormality or ductal dilatation. Spleen: Markedly abnormal spleen. There appears to be splenic lacerations in the midpole region with surrounding subcapsular/perisplenic hematoma. No active extravasation. Adrenals/Urinary Tract: Adrenal glands normal. Multiple small bilateral renal cysts appear benign. No follow-up imaging recommended. No hydronephrosis. Urinary bladder unremarkable. Stomach/Bowel: Sigmoid diverticulosis. No active diverticulitis. Stomach and small bowel decompressed, unremarkable. Lymphatic: No adenopathy Reproductive:  Multiple uterine fibroids.  No adnexal mass. Other: No free fluid or free air. Musculoskeletal: No acute bony abnormality. IMPRESSION: Markedly abnormal appearance of the spleen with apparent splenic lacerations in the midpole region. Surrounding subcapsular hematoma. No free fluid in the abdomen or pelvis. No active extravasation of contrast. Hepatomegaly. Bilateral lower lobe airspace opacities, left greater than right concerning for pneumonia. Fibroid uterus. Electronically Signed   By: Franky Crease M.D.   On: 08/18/2023 22:23   CT Angio Chest PE W/Cm &/Or Wo Cm Result Date: 08/18/2023 CLINICAL DATA:  Shortness of breath. EXAM: CT ANGIOGRAPHY CHEST WITH CONTRAST TECHNIQUE: Multidetector CT imaging of the chest was performed using the standard protocol during bolus administration of intravenous contrast. Multiplanar CT image reconstructions and MIPs were obtained to evaluate the vascular anatomy. RADIATION DOSE REDUCTION: This exam was performed according to the departmental dose-optimization program which includes automated exposure  control, adjustment of the mA and/or kV according to patient size and/or use of iterative reconstruction technique. CONTRAST:  75mL OMNIPAQUE  IOHEXOL  350 MG/ML SOLN COMPARISON:  December 21, 2018. FINDINGS: Cardiovascular: No evidence of large central pulmonary embolus seen in the main pulmonary artery or proximal portions of the left and right pulmonary arteries. However, lower lobe branches bilaterally are not well visualized due to surrounding atelectasis and limited opacification, and therefore smaller and more peripheral pulmonary emboli cannot be excluded on the basis of this exam. Mild cardiomegaly is noted. No pericardial effusion. Mediastinum/Nodes: No enlarged mediastinal, hilar, or axillary lymph nodes. Thyroid  gland, trachea, and esophagus demonstrate no significant findings. Lungs/Pleura: No pneumothorax or pleural effusion is noted. Bilateral posterior basilar  atelectasis is noted. Upper Abdomen: No acute abnormality. Musculoskeletal: No chest wall abnormality. No acute or significant osseous findings. Review of the MIP images confirms the above findings. IMPRESSION: No definite evidence of large central pulmonary embolus seen in main pulmonary artery or proximal portions of left and right pulmonary arteries. However, due to adjacent atelectasis and limited contrast opacification, lower lobe branches of both pulmonary arteries are not well visualized and therefore smaller and more peripheral pulmonary emboli cannot be excluded on the basis of this exam. Electronically Signed   By: Lynwood Landy Raddle M.D.   On: 08/18/2023 19:31   CT ABDOMEN PELVIS W CONTRAST Result Date: 08/18/2023 CLINICAL DATA:  Sepsis. EXAM: CT ABDOMEN AND PELVIS WITH CONTRAST TECHNIQUE: Multidetector CT imaging of the abdomen and pelvis was performed using the standard protocol following bolus administration of intravenous contrast. RADIATION DOSE REDUCTION: This exam was performed according to the departmental dose-optimization program which includes automated exposure control, adjustment of the mA and/or kV according to patient size and/or use of iterative reconstruction technique. CONTRAST:  75mL OMNIPAQUE  IOHEXOL  350 MG/ML SOLN COMPARISON:  CT abdomen and pelvis 03/09/2018 FINDINGS: Lower chest: There are mild patchy airspace opacities in both lower lobes, left greater than right. Hepatobiliary: The liver is mildly enlarged. No focal liver lesions are seen. Gallbladder and bile ducts are within normal limits. Pancreas: Unremarkable. No pancreatic ductal dilatation or surrounding inflammatory changes. Spleen: The spleen appears heterogeneously enhancing which may be related to phase of contrast. Splenic laceration and subcapsular fluid collections are a possibility as well. Overall measurements of the spleen are slightly larger when compared to 2020. There is no surrounding inflammatory stranding.  Adrenals/Urinary Tract: Bilateral renal cysts are present measuring up to 3.4 cm on the right. There is no hydronephrosis. The adrenal glands and bladder are within normal limits. Stomach/Bowel: Stomach is within normal limits. Appendix appears normal. No evidence of bowel wall thickening, distention, or inflammatory changes. There is sigmoid colon diverticulosis. Vascular/Lymphatic: Aortic atherosclerosis. No enlarged abdominal or pelvic lymph nodes. Reproductive: Uterus is enlarged with multiple fibroids. Largest fibroid is on the right measuring 6.8 cm. Adnexa are within normal limits. Other: There is mild diffuse body wall edema. There is no focal abdominal wall hernia. There is trace fluid in the left upper quadrant. Musculoskeletal: No acute or significant osseous findings. IMPRESSION: 1. Indeterminate heterogeneous enhancement of the spleen with mild enlargement when compared to prior. Splenic laceration and subcapsular fluid collections are a possibility as well as infection. 2. Trace fluid in the left upper quadrant. 3. Mild patchy airspace opacities in both lower lobes, left greater than right, worrisome for pneumonia. 4. Mild hepatomegaly. 5. Uterine fibroids. 6. Aortic atherosclerosis. Aortic Atherosclerosis (ICD10-I70.0). Electronically Signed   By: Greig Pique M.D.   On:  08/18/2023 19:31   DG Chest Port 1 View Result Date: 08/18/2023 CLINICAL DATA:  Question of sepsis to evaluate for abnormality. Shortness of breath and bilateral leg weakness. EXAM: PORTABLE CHEST 1 VIEW COMPARISON:  12/21/2018 FINDINGS: Postoperative changes in the mediastinum. Shallow inspiration. Cardiac enlargement with mild central vascular congestion. Interstitial changes in the lungs likely represent mild interstitial edema. Linear scarring or atelectasis in the lung bases. No pleural effusion pneumothorax. Mediastinal contours appear intact. IMPRESSION: Cardiac enlargement with developing pulmonary vascular congestion and  suggestion of mild interstitial edema. Electronically Signed   By: Elsie Gravely M.D.   On: 08/18/2023 17:58    Microbiology: Results for orders placed or performed during the hospital encounter of 08/18/23  Blood Culture (routine x 2)     Status: None   Collection Time: 08/18/23  5:48 PM   Specimen: BLOOD  Result Value Ref Range Status   Specimen Description BLOOD SITE NOT SPECIFIED  Final   Special Requests   Final    BOTTLES DRAWN AEROBIC AND ANAEROBIC Blood Culture adequate volume   Culture   Final    NO GROWTH 5 DAYS Performed at [facility], [address], [place], [place] [address]    Report Status 08/23/2023 FINAL  Final  Resp panel by RT-PCR (RSV, Flu A&B, Covid) Anterior Nasal Swab     Status: None   Collection Time: 08/18/23  6:06 PM   Specimen: Anterior Nasal Swab  Result Value Ref Range Status   SARS Coronavirus 2 by RT PCR NEGATIVE NEGATIVE Final   Influenza A by PCR NEGATIVE NEGATIVE Final   Influenza B by PCR NEGATIVE NEGATIVE Final    Comment: (NOTE) The Xpert Xpress SARS-CoV-2/FLU/RSV plus assay is intended as an aid in the diagnosis of influenza from Nasopharyngeal swab specimens and should not be used as a sole basis for treatment. Nasal washings and aspirates are unacceptable for Xpert Xpress SARS-CoV-2/FLU/RSV testing.  Fact Sheet for Patients: BloggerCourse.com  Fact Sheet for Healthcare Providers: SeriousBroker.it  This test is not yet approved or cleared by the [place]  FDA and has been authorized for detection and/or diagnosis of SARS-CoV-2 by FDA under an Emergency Use Authorization (EUA). This EUA will remain in effect (meaning this test can be used) for the duration of the COVID-19 declaration under Section 564(b)(1) of the Act, 21 U.S.C. section 360bbb-3(b)(1), unless the authorization is terminated or revoked.     Resp Syncytial Virus by PCR NEGATIVE NEGATIVE Final     Comment: (NOTE) Fact Sheet for Patients: BloggerCourse.com  Fact Sheet for Healthcare Providers: SeriousBroker.it  This test is not yet approved or cleared by the [place]  FDA and has been authorized for detection and/or diagnosis of SARS-CoV-2 by FDA under an Emergency Use Authorization (EUA). This EUA will remain in effect (meaning this test can be used) for the duration of the COVID-19 declaration under Section 564(b)(1) of the Act, 21 U.S.C. section 360bbb-3(b)(1), unless the authorization is terminated or revoked.  Performed at [facility], [address] [place], [place] [address]   Blood Culture (routine x 2)     Status: None   Collection Time: 08/18/23  6:15 PM   Specimen: BLOOD LEFT FOREARM  Result Value Ref Range Status   Specimen Description BLOOD LEFT FOREARM  Final   Special Requests   Final    BOTTLES DRAWN AEROBIC AND ANAEROBIC Blood Culture adequate volume   Culture   Final    NO GROWTH 5 DAYS Performed at  [facility], [address], [place], [place] [address]    Report Status 08/23/2023 FINAL  Final  MRSA Next Gen by PCR, Nasal     Status: None   Collection Time: 08/19/23  1:58 PM   Specimen: Nasal Mucosa; Nasal Swab  Result Value Ref Range Status   MRSA by PCR Next Gen NOT DETECTED NOT DETECTED Final    Comment: (NOTE) The GeneXpert MRSA Assay (FDA approved for NASAL specimens only), is one component of a comprehensive MRSA colonization surveillance program. It is not intended to diagnose MRSA infection nor to guide or monitor treatment for MRSA infections. Test performance is not FDA approved in patients less than 47 years old. Performed at [facility], [address] [place], [place] [address]   MRSA Next Gen by PCR, Nasal     Status: None   Collection Time: 08/25/23 12:04 PM   Specimen: Nasal Mucosa; Nasal Swab  Result Value Ref Range Status   MRSA by PCR Next Gen NOT  DETECTED NOT DETECTED Final    Comment: (NOTE) The GeneXpert MRSA Assay (FDA approved for NASAL specimens only), is one component of a comprehensive MRSA colonization surveillance program. It is not intended to diagnose MRSA infection nor to guide or monitor treatment for MRSA infections. Test performance is not FDA approved in patients less than 48 years old. Performed at [facility], [address] [place], [place] [address]     Discharge time spent: 35 minutes.  Signed: Elgin Lam, MD [facility] 09/07/2023

## 2023-09-07 NOTE — Discharge Instructions (Addendum)
 Inpatient Rehab Discharge Instructions  [name] Discharge date and time: No discharge date for patient encounter.   Activities/Precautions/ Functional Status: Activity: activity as tolerated Diet: regular diet Wound Care: Routine skin checks she Functional status:  ___ No restrictions     ___ Walk up steps independently ___ 24/7 supervision/assistance   ___ Walk up steps with assistance ___ Intermittent supervision/assistance  ___ Bathe/dress independently ___ Walk with walker     _x__ Bathe/dress with assistance ___ Walk Independently    ___ Shower independently ___ Walk with assistance    ___ Shower with assistance ___ No alcohol     ___ Return to work/school ________  Special Instructions: No driving smoking or alcohol  Follow-up outpatient OB/GYN for endometrial thickening/uterine fibroids  COMMUNITY REFERRALS UPON DISCHARGE:    Home Health:   PT   OT                Agency: [facility] Phone: [phone]   Medical Equipment/Items Ordered:BARIATRIC [name], BARIATRIC BEDSIDE COMMODE AND BARIATRIC TUB BENCH                                                 Agency/Supplier:[facility]   [phone]     My questions have been answered and I understand these instructions. I will adhere to these goals and the provided educational materials after my discharge from the hospital.  Patient/Caregiver Signature _______________________________ Date __________  Clinician Signature _______________________________________ Date __________  Please bring this form and your medication list with you to all your follow-up doctor's appointments.

## 2023-09-07 NOTE — H&P (Signed)
 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  Per chart review patient lives with sister.  1 level home.  Modified independent for mobility and sister assist with ADLs.  Patient does report a fall 2-3 months ago.  Sister works during the day 8-5 and numerous other sisters close by with good support.  Presented 08/18/2023 with increasing shortness of breath/worse with exertion.  She reported 2 days of new cough productive of dark brown sputum.  Admission chemistries unremarkable except glucose 109, AST 59, WBC 16,200, troponin 130-123, BNP 105, blood culture no growth to date, urinalysis negative nitrite.  Chest x-ray cardiac enlargement with developing pulmonary vascular congestion suggestion of mild interstitial edema.  CT angio of the chest no definite evidence of PE.  CT abdomen pelvis indeterminant heterogeneous enhancement of the spleen with mild enlargement when compared to prior.  Splenic laceration and subcapsular fluid collections were suggested.  CTA angiogram of the abdomen pelvis showed markedly abnormal appearance of the spleen with apparent splenic laceration in the midpole region.  Surrounding subcapsular hematoma.  No free fluid in the abdomen or pelvis.  No active extravasation of contrast.  Bilateral lower lobe airspace opacities, left greater than right concerning for pneumonia.  She was started on ceftriaxone /doxycycline  as well as IV fluids.  Hospital course complicated by worsening mental status and respiratory failure with evidence of  myasthenia gravis exacerbation.  Neurology consulted and patient was eventually transferred to the ICU for intubation and airway protection and mechanical ventilation, treated with 5 days of IVIG and pyridostigmine , and extubated 8-10.  She was offered Plex therapy by neurology but refused.  Hospital course complicated by community-acquired pneumonia completing antibiotic course as well as bouts of hypernatremia felt to be secondary to poor oral intake improved 141.  Follow-up general surgery in regards to splenic laceration/hematoma recommending supportive care and hemoglobin remained stable at 10.7.  Patient with persistent right knee pain x-rays unremarkable conservative care provided.  Therapy evaluations completed due to patient decreased functional mobility was admitted for a comprehensive rehab program.   Review of Systems  HENT:  Negative for hearing loss.   Eyes:  Positive for double vision. Negative for blurred vision.  Respiratory:  Positive for cough, sputum production and shortness of breath. Negative for wheezing.   Cardiovascular:  Positive for leg swelling. Negative for chest pain and palpitations.  Gastrointestinal:  Positive for constipation. Negative for heartburn, nausea and vomiting.  Genitourinary:  Negative for dysuria, flank pain and hematuria.  Musculoskeletal:  Positive for joint pain (right knee), myalgias and neck pain.       Recent fall 2-3 months ago  Skin:  Negative for rash.  Neurological:  Positive for dizziness and sensory change (both feet).  Psychiatric/Behavioral:  Positive for depression. The patient has insomnia.   All other systems reviewed and are negative.      Past Medical History:  Diagnosis Date   Asthma     Depression     Heart murmur     Myasthenia gravis (HCC)     Obesity     Pulmonary embolism (HCC)  Sleep apnea     Thyroid  disease               Past Surgical History:  Procedure Laterality Date   BREAST SURGERY   06/2019    breast  reduction   THYROID  SURGERY       TONSILLECTOMY       TUBAL LIGATION            History reviewed. No pertinent family history.     Social History:  reports that she has quit smoking. She has never used smokeless tobacco. She reports current alcohol use. She reports that she does not use drugs. Allergies:  Allergies       Allergies  Allergen Reactions   Magnesium  Sulfate Other (See Comments)      Myasthenia gravis. Consider withholding replacement unless severely low.             Medications Prior to Admission  Medication Sig Dispense Refill   acetaminophen  (TYLENOL ) 500 MG tablet Take 500 mg by mouth every 6 (six) hours as needed for mild pain.       albuterol  (PROVENTIL ) (2.5 MG/3ML) 0.083% nebulizer solution Take 2.5 mg by nebulization every 6 (six) hours as needed for wheezing or shortness of breath.       albuterol  (VENTOLIN  HFA) 108 (90 Base) MCG/ACT inhaler Inhale 2 puffs into the lungs every 4 (four) hours as needed for wheezing or shortness of breath.        ARIPiprazole  (ABILIFY ) 2 MG tablet Take 2 mg by mouth daily.       cyanocobalamin (VITAMIN B12) 1000 MCG tablet Take 1 tablet by mouth daily.       famotidine  (PEPCID ) 20 MG tablet Take 1 tablet (20 mg total) by mouth 2 (two) times daily. 30 tablet 0   ferrous sulfate 324 MG TBEC Take 324 mg by mouth daily with breakfast.       levothyroxine  (SYNTHROID ) 200 MCG tablet Take 200 mcg by mouth daily before breakfast.       losartan  (COZAAR ) 50 MG tablet Take 50 mg by mouth daily.       mirtazapine  (REMERON ) 15 MG tablet Take 15 mg by mouth at bedtime.       montelukast  (SINGULAIR ) 10 MG tablet Take 10 mg by mouth at bedtime.       mycophenolate  (CELLCEPT ) 500 MG tablet Take 1,000 mg by mouth 2 (two) times daily.   0   pregabalin  (LYRICA ) 200 MG capsule Take 200 mg by mouth 2 (two) times daily.       pyridostigmine  (MESTINON ) 60 MG tablet Take 30 mg by mouth 5 (five) times daily.    0   Pyridoxine HCl (VITAMIN B6 PO) Take  1,000 mcg by mouth daily.                  Home: Home Living Family/patient expects to be discharged to:: Private residence Living Arrangements:  (Lives with sister, [name]) Available Help at Discharge:  (63 yo Mom and mutiple sisters can assist at discharge) Type of Home: House Home Access: Stairs to enter Entrance Stairs-Rails: Right, Left (5 to 7 steps) Home Layout: One level Bathroom Shower/Tub: Engineer, manufacturing systems: Standard Bathroom Accessibility: Yes Home Equipment: Other (comment) (home O2) Additional Comments: sister works 8-5; she has 6 other sisters near by  Lives With: Family (Sister, [place])   Functional History: Prior Function Prior Level of Function : Independent/Modified Independent Mobility Comments: indep, no AD, denies falls ADLs Comments:  sister assists with IADLs   Functional Status:  Mobility: Bed Mobility Overal bed mobility: Needs Assistance Bed Mobility: Supine to Sit Rolling: Mod assist Supine to sit: HOB elevated, Used rails, Min assist Sit to supine: Max assist General bed mobility comments: not tested, pt received & left sitting in recliner Transfers Overall transfer level: Needs assistance Equipment used: Rolling walker (2 wheels) Transfers: Sit to/from Stand Sit to Stand: Supervision Bed to/from chair/wheelchair/BSC transfer type:: Step pivot Step pivot transfers: Min assist  Lateral/Scoot Transfers: Total assist General transfer comment: sit>stand from recliner with good ability to push to standing, poor eccentric control during stand>sit on 1st attempt but improved on 2nd attempt with cuing Ambulation/Gait Ambulation/Gait assistance: Contact guard assist Gait Distance (Feet):  (30 + 11 ft) Assistive device: Rolling walker (2 wheels) Gait Pattern/deviations: Decreased step length - left, Decreased stride length, Decreased step length - right General Gait Details: R knee instability but no buckling, extra time when  turning Gait velocity: variable but overall decreased Gait velocity interpretation: <1.31 ft/sec, indicative of household ambulator Pre-gait activities: Attempted to have pt take lateral steps toward [facility], pt unable to offload each LE to march or take shuffled steps, pt sits impulsively due to fatigue/BLE weakness.   ADL: ADL Overall ADL's : Needs assistance/impaired Eating/Feeding: Set up, Sitting Grooming: Wash/dry hands, Wash/dry face, Oral care, Set up, Sitting Upper Body Bathing: Contact guard assist, Sitting Upper Body Bathing Details (indicate cue type and reason): on EOB Lower Body Bathing: Moderate assistance, Sit to/from stand Lower Body Bathing Details (indicate cue type and reason): assistance with peri area back Upper Body Dressing : Minimal assistance, Sitting Upper Body Dressing Details (indicate cue type and reason): gown Lower Body Dressing: Maximal assistance, Sitting/lateral leans Lower Body Dressing Details (indicate cue type and reason): socks Toilet Transfer: Total assistance, +2 for safety/equipment, +2 for physical assistance Toilet Transfer Details (indicate cue type and reason): simulated. Able to STS with mod A +2, unable to progress to stepping Toileting- Clothing Manipulation and Hygiene: Total assistance, +2 for physical assistance, +2 for safety/equipment, Sit to/from stand Functional mobility during ADLs: Moderate assistance, +2 for physical assistance, +2 for safety/equipment General ADL Comments: focused on sitting EOB and sit to stands   Cognition: Cognition Orientation Level: Oriented X4 Cognition Arousal: Alert Behavior During Therapy: WFL for tasks assessed/performed   Physical Exam: Blood pressure (!) 108/48, pulse 70, temperature 97.7 F (36.5 C), resp. rate 18, height 5' (1.524 m), weight 129 kg, SpO2 100%. Physical Exam Constitutional:      General: She is not in acute distress.    Appearance: She is obese.  HENT:     Head: Normocephalic  and atraumatic.     Right Ear: External ear normal.     Left Ear: External ear normal.     Nose: Nose normal.     Mouth/Throat:     Mouth: Mucous membranes are moist.     Pharynx: No oropharyngeal exudate or posterior oropharyngeal erythema (edentulous).  Eyes:     General: No scleral icterus.    Conjunctiva/sclera: Conjunctivae normal.  Cardiovascular:     Rate and Rhythm: Normal rate and regular rhythm.     Heart sounds: No murmur heard.    No gallop.  Pulmonary:     Effort: Pulmonary effort is normal.     Breath sounds: Normal breath sounds.     Comments: Short, shallow breaths Abdominal:     General: Bowel sounds are normal. There is no distension.  Palpations: There is no mass.  Musculoskeletal:     Cervical back: Normal range of motion and neck supple.  Neurological:     Mental Status: She is alert.     Comments: Patient was alert sitting up in bed.  Makes eye contact with examiner.  She does answer basic questions as name age and place.  She does follow simple commands       Lab Results Last 48 Hours  No results found for this or any previous visit (from the past 48 hours).   Imaging Results (Last 48 hours)  No results found.           Blood pressure (!) 108/48, pulse 70, temperature 97.7 F (36.5 C), resp. rate 18, height 5' (1.524 m), weight 129 kg, SpO2 100%.   Medical Problem List and Plan: 1. Functional deficits secondary to debility secondary to exacerbation of myasthenia gravis.  Completed 5-day course of IVIG.  Continue Mestinon  30 mg 5 times daily as well as CellCept              -patient may shower             -ELOS/Goals: 7-10 days, supervision PT, sup-min OT, supervision SLP 2.  Antithrombotics: -DVT/anticoagulation:  Mechanical: Antiembolism stockings, thigh (TED hose) Bilateral lower extremities.  Venous Doppler studies negative             -antiplatelet therapy: N/A 3. 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 continue Voltaren  gel 4 times daily -prn tylenol   -might benefit from brace with activity 4. Mood/Behavior/Sleep: Provide emotional support             -antipsychotic agents: Abilify  2 mg daily 5. Neuropsych/cognition: This patient is capable of making decisions on her own behalf. 6. Skin/Wound Care: Routine skin checks 7. Fluids/Electrolytes/Nutrition: Routine in and outs with follow-up chemistries 8.  Acute on chronic respiratory failure with hypoxia and hypercarbia.  Extubated 8/10.  Continue BiPAP.  Continue nebulizers as directed.   -Patient on 2 L O2 nasal cannula prior to admission -keep HOB at 30 degrees 9.  Community-acquired pneumonia.  Antibiotic course completed.  Check oxygen  saturations every shift 11.  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  Dietary follow-up 12.  Hypertension.  Cozaar  50 mg daily.  Monitor with increased mobility 13.  Hypothyroidism.  Synthroid  14.  Splenic laceration/hematoma.  Recommendations of supportive care per general surgery 15.  Normocytic anemia.  Follow-up CBC   Toribio JINNY Pitch, PA-C 09/07/2023  I have personally performed a face to face diagnostic evaluation of this patient and formulated the key components of the plan.  Additionally, I have personally reviewed laboratory data, imaging studies, as well as relevant notes and concur with the physician assistant's documentation above.  The patient's status has not changed from the original H&P.  Any changes in documentation from the acute care chart have been noted above.  Arthea IVAR Gunther, MD, [facility]

## 2023-09-07 NOTE — Discharge Summary (Signed)
 Physician Discharge Summary  Patient ID: [name] MRN: [id number] DOB/AGE: January 21, 1965 58 y.o.  Admit date: 09/07/2023 Discharge date: 09/22/2023  Discharge Diagnoses:  Principal Problem:   Myasthenia gravis (HCC) Active Problems:   Leukocytosis Acute on chronic respiratory failure with hypoxia and hypercarbia Community-acquired pneumonia Class IV obesity Hypertension Hypothyroidism Splenic laceration hematoma Normocytic anemia Right knee osteoarthritis Vaginal bleeding  Discharged Condition: Stable  Significant Diagnostic Studies: US  PELVIS LIMITED (TRANSABDOMINAL ONLY) Result Date: 09/19/2023 CLINICAL DATA:  Postmenopausal bleeding. EXAM: TRANSABDOMINAL ULTRASOUND OF PELVIS TECHNIQUE: Transabdominal ultrasound examination of the pelvis was performed including evaluation of the uterus, ovaries, adnexal regions, and pelvic cul-de-sac. COMPARISON:  None Available. FINDINGS: Uterus Measurements: 13.7 x 5.8 x 9.8 cm = volume: 110 mL. Multiple hypoechoic lesions measuring up to 8.9 x 6.4 x 5.2 cm. Endometrium Thickness: 11 mm, thickened in the setting of postmenopausal bleeding. No focal abnormality visualized. Right ovary Not visualized. Left ovary Not visualized. Other findings:  No abnormal free fluid. IMPRESSION: 1. Endometrial thickening. In the setting of post-menopausal bleeding, endometrial sampling is indicated to exclude carcinoma. If results are benign, sonohysterogram should be considered for focal lesion work-up. (Ref: Radiological Reasoning: Algorithmic Workup of Abnormal Vaginal Bleeding with Endovaginal Sonography and Sonohysterography. AJR 2008; 808:D31-26). 2. Uterine fibroids. Electronically Signed   By: Newell Eke M.D.   On: 09/19/2023 11:19   DG Chest Port 1 View Result Date: 09/19/2023 CLINICAL DATA:  Acute respiratory distress EXAM: PORTABLE CHEST 1 VIEW COMPARISON:  Chest x-ray 09/15/2023 FINDINGS: The heart is enlarged. Median sternotomy wires and  mediastinal clips are again seen. There is a small left pleural effusion and minimal left basilar opacities, unchanged. There is no pneumothorax or acute fracture. IMPRESSION: Stable small left pleural effusion and minimal left basilar opacities. Electronically Signed   By: Greig Pique M.D.   On: 09/19/2023 01:18   DG Chest 2 View Result Date: 09/15/2023 CLINICAL DATA:  Leukocytosis. EXAM: CHEST - 2 VIEW COMPARISON:  Chest radiograph dated 09/11/2023. FINDINGS: There is cardiomegaly with vascular congestion and edema. Small left pleural effusion and left lung base atelectasis or infiltrate. No pneumothorax. Median sternotomy wires. No acute osseous pathology. IMPRESSION: 1. Cardiomegaly with vascular congestion and edema. 2. Small left pleural effusion and left lung base atelectasis or infiltrate. Electronically Signed   By: Vanetta Chou M.D.   On: 09/15/2023 19:49   DG CHEST PORT 1 VIEW Result Date: 09/11/2023 CLINICAL DATA:  [id number]. Shortness of breath with rhonchi at the lung bases. EXAM: PORTABLE CHEST 1 VIEW COMPARISON:  Portable chest 08/27/2023. FINDINGS: Interval NGT/ETT removal. Stable cardiomegaly, scattered upper mediastinal surgical clips and intact sternotomy sutures. There is vascular prominence of both hila with vascular congestion and flow cephalization. There is no overt pulmonary edema. Again noted is a moderate left pleural effusion with overlying atelectasis or consolidation. Trace right pleural fluid. Remaining lungs generally clear. Stable mediastinum with aortic atherosclerosis. No new osseous finding. IMPRESSION: 1. Cardiomegaly with vascular congestion and flow cephalization. No overt pulmonary edema. 2. Moderate left pleural effusion with overlying atelectasis or consolidation, seen previously. 3. Trace right pleural fluid. 4. Aortic atherosclerosis. Electronically Signed   By: Francis Quam M.D.   On: 09/11/2023 20:04   VAS US  LOWER EXTREMITY VENOUS (DVT) Result Date:  09/05/2023  Lower Venous DVT Study Patient Name:  [name]  Date of Exam:   09/04/2023 Medical Rec #: [id number]           Accession #:    [id number] Date  of Birth: [birth year]           Patient Gender: F Patient Age:   18 years Exam Location:  [facility] Procedure:      VAS US  LOWER EXTREMITY VENOUS (DVT) Referring Phys: RALPH NETTEY --------------------------------------------------------------------------------  Indications: Pain.  Risk Factors: Hx of PE, prolonged hospitalization/decreased mobility. Limitations: Body habitus and poor ultrasound/tissue interface. Comparison Study: Previous exam on 08/17/2016 was negative for DVT. Performing Technologist: Ezzie Potters RVT, RDMS  Examination Guidelines: A complete evaluation includes B-mode imaging, spectral Doppler, color Doppler, and power Doppler as needed of all accessible portions of each vessel. Bilateral testing is considered an integral part of a complete examination. Limited examinations for reoccurring indications may be performed as noted. The reflux portion of the exam is performed with the patient in reverse Trendelenburg.  +---------+---------------+---------+-----------+----------+--------------+ RIGHT    CompressibilityPhasicitySpontaneityPropertiesThrombus Aging +---------+---------------+---------+-----------+----------+--------------+ CFV      Full           Yes      Yes                                 +---------+---------------+---------+-----------+----------+--------------+ SFJ      Full                                                        +---------+---------------+---------+-----------+----------+--------------+ FV Prox  Full           Yes      Yes                                 +---------+---------------+---------+-----------+----------+--------------+ FV Mid   Full           Yes      Yes                                  +---------+---------------+---------+-----------+----------+--------------+ FV DistalFull           Yes      Yes                                 +---------+---------------+---------+-----------+----------+--------------+ PFV      Full                                                        +---------+---------------+---------+-----------+----------+--------------+ POP      Full           Yes      Yes                                 +---------+---------------+---------+-----------+----------+--------------+ PTV      Full                                                        +---------+---------------+---------+-----------+----------+--------------+  PERO     Full                                                        +---------+---------------+---------+-----------+----------+--------------+   +---------+---------------+---------+-----------+----------+--------------+ LEFT     CompressibilityPhasicitySpontaneityPropertiesThrombus Aging +---------+---------------+---------+-----------+----------+--------------+ CFV      Full           Yes      Yes                                 +---------+---------------+---------+-----------+----------+--------------+ SFJ      Full                                                        +---------+---------------+---------+-----------+----------+--------------+ FV Prox  Full           Yes      Yes                                 +---------+---------------+---------+-----------+----------+--------------+ FV Mid   Full           Yes      Yes                                 +---------+---------------+---------+-----------+----------+--------------+ FV DistalFull           Yes      Yes                                 +---------+---------------+---------+-----------+----------+--------------+ PFV      Full                                                         +---------+---------------+---------+-----------+----------+--------------+ POP      Full           Yes      Yes                                 +---------+---------------+---------+-----------+----------+--------------+ PTV      Full                                                        +---------+---------------+---------+-----------+----------+--------------+ PERO     Full                                                        +---------+---------------+---------+-----------+----------+--------------+  Summary: BILATERAL: - No evidence of deep vein thrombosis seen in the lower extremities, bilaterally. -No evidence of popliteal cyst, bilaterally.   *See table(s) above for measurements and observations. Electronically signed by Penne Colorado MD on 09/05/2023 at 11:04:26 AM.    Final    DG Knee Right Port Result Date: 09/02/2023 CLINICAL DATA:  Right knee pain. EXAM: PORTABLE RIGHT KNEE - 1-2 VIEW COMPARISON:  12/26/2017 FINDINGS: AP and lateral views of the knee. The osteochondral lesion in the medial femoral condyle on prior exam is not seen, possibly due to positioning. No evidence of acute fracture. Normal alignment. Moderate patellofemoral osteoarthritis. Minimal joint effusion. Mild soft tissue edema. IMPRESSION: Negative. Electronically Signed   By: Andrea Gasman M.D.   On: 09/02/2023 16:00   DG CHEST PORT 1 VIEW Result Date: 08/27/2023 EXAM: 1 VIEW XRAY OF THE CHEST 08/27/2023 09:03:00 AM COMPARISON: None available. CLINICAL HISTORY: Lung collapse. Patient has history of asthma. FINDINGS: LUNGS AND PLEURA: Low lung volumes with mild infiltrates or edema, left worse than right. However, left lung aeration is significantly improved since the previous exam. Suspect small to moderate left pleural effusion. HEART AND MEDIASTINUM: No acute abnormality of the cardiac and mediastinal silhouettes. Sternotomy wires. BONES AND SOFT TISSUES: No acute osseous abnormality. Stable  endotracheal tube and gastric tube. Left arm PICC line with tip in the proximal right atrium. IMPRESSION: 1. Low lung volumes with mild infiltrates or edema, left worse than right, and suspect small to moderate left pleural effusion. 2. Left lung aeration is significantly improved since the previous exam. Electronically signed by: Katheleen Faes MD 08/27/2023 09:16 AM EDT RP Workstation: [id number]   Portable Chest x-ray Result Date: 08/25/2023 CLINICAL DATA:  Respiratory failure. EXAM: PORTABLE CHEST 1 VIEW COMPARISON:  Same day. FINDINGS: Endotracheal and nasogastric tubes are in grossly good position. Sternotomy wires are noted. There is now noted complete opacification of the left hemithorax most likely due to pneumonia, edema, atelectasis and/or effusion. No significant abnormality seen in the right lung. Bony thorax is unremarkable. IMPRESSION: Support apparatus as noted above. Complete opacification of left hemithorax is now noted most likely due to pneumonia, edema, atelectasis and/or pleural effusion. Electronically Signed   By: Lynwood Landy Raddle M.D.   On: 08/25/2023 13:34   DG Abd Portable 1V Result Date: 08/25/2023 CLINICAL DATA:  Nasogastric tube placement. EXAM: PORTABLE ABDOMEN - 1 VIEW COMPARISON:  April 20, 2023. FINDINGS: Distal tip of nasogastric tube is either in the distal stomach or proximal duodenum. IMPRESSION: Nasogastric tube tip seen in either distal stomach or proximal duodenum. Electronically Signed   By: Lynwood Landy Raddle M.D.   On: 08/25/2023 13:31   DG CHEST PORT 1 VIEW Result Date: 08/25/2023 CLINICAL DATA:  Shortness of breath. EXAM: PORTABLE CHEST 1 VIEW COMPARISON:  August 18, 2023. FINDINGS: Stable cardiomegaly. Mild central pulmonary vascular congestion is noted. Sternotomy wires are noted. Left basilar atelectasis or infiltrate is noted with small pleural effusion. Bony thorax is unremarkable. IMPRESSION: Stable cardiomegaly with mild central pulmonary vascular congestion. Left  basilar atelectasis or infiltrate is noted with small pleural effusion. Electronically Signed   By: Lynwood Landy Raddle M.D.   On: 08/25/2023 13:31   US  EKG SITE RITE Result Date: 08/25/2023 If Site Rite image not attached, placement could not be confirmed due to current cardiac rhythm.  VAS US  ABI WITH/WO TBI Result Date: 08/24/2023  LOWER EXTREMITY DOPPLER STUDY Patient Name:  BIBI ECONOMOS  Date of Exam:   08/23/2023 Medical  Rec #: [id number]           Accession #:    [id number] Date of Birth: 06/26/1965           Patient Gender: F Patient Age:   37 years Exam Location:  [facility] Procedure:      VAS US  ABI WITH/WO TBI Referring Phys: DELILIAH RASHID --------------------------------------------------------------------------------  Indications: Bilateral leg and foot pain High Risk Factors: Hypertension. Other Factors: Splenic laceration, obstructive sleep apnea, Myasthenia Gravis,                Morbid obesity (BMI 70).  Comparison Study: No prior study on file Performing Technologist: Rachel Pellet RVS  Examination Guidelines: A complete evaluation includes at minimum, Doppler waveform signals and systolic blood pressure reading at the level of bilateral brachial, anterior tibial, and posterior tibial arteries, when vessel segments are accessible. Bilateral testing is considered an integral part of a complete examination. Photoelectric Plethysmograph (PPG) waveforms and toe systolic pressure readings are included as required and additional duplex testing as needed. Limited examinations for reoccurring indications may be performed as noted.  ABI Findings: +---------+------------------+-----+-----------+--------+ Right    Rt Pressure (mmHg)IndexWaveform   Comment  +---------+------------------+-----+-----------+--------+ PTA      150               1.06 multiphasic         +---------+------------------+-----+-----------+--------+ DP       179               1.26 multiphasic          +---------+------------------+-----+-----------+--------+ Great Toe132               0.93 Normal              +---------+------------------+-----+-----------+--------+ +---------+------------------+-----+-----------+-------+ Left     Lt Pressure (mmHg)IndexWaveform   Comment +---------+------------------+-----+-----------+-------+ Brachial 142                    triphasic          +---------+------------------+-----+-----------+-------+ PTA      161               1.13 multiphasic        +---------+------------------+-----+-----------+-------+ DP       166               1.17 multiphasic        +---------+------------------+-----+-----------+-------+ Great Toe85                0.60 Abnormal           +---------+------------------+-----+-----------+-------+ +-------+-----------+-----------+------------+------------+ ABI/TBIToday's ABIToday's TBIPrevious ABIPrevious TBI +-------+-----------+-----------+------------+------------+ Right  1.26       0.93                                +-------+-----------+-----------+------------+------------+ Left   1.17       0.60                                +-------+-----------+-----------+------------+------------+   Summary: Right: Resting right ankle-brachial index is within normal range. The right toe-brachial index is normal. Left: Resting left ankle-brachial index is within normal range. The left toe-brachial index is abnormal. *See table(s) above for measurements and observations.  Electronically signed by Norman Serve on 08/24/2023 at 9:20:52 AM.    Final     Labs:  Basic Metabolic Panel: Recent Labs  Lab 09/16/23 0519  NA 141  K 3.9  CL 96*  CO2 39*  GLUCOSE 93  BUN 8  CREATININE 0.64  CALCIUM  9.0    CBC: Recent Labs  Lab 09/16/23 0519 09/19/23 1033 09/21/23 0517  WBC 8.8 9.6 6.8  NEUTROABS  --   --  4.5  HGB 10.1* 9.4* 9.5*  HCT 33.5* 32.3* 31.6*  MCV 97.4 99.4 96.9  PLT 168 150 146*     CBG: No results for input(s): GLUCAP in the last 168 hours.   Brief HPI:   [name] is a 58 y.o. right-handed female with history significant for myasthenia gravis moderate persistent asthma chronic hypoxic and hypercarbic respiratory failure on 2 L oxygen  nasal cannula, OSA with CPAP history of pulmonary emboli no longer on anticoagulation hypertension hypothyroidism depression 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  Per chart review lives with sister.  1 level home.  Modified independent with mobility and sister assist with ADLs.  Patient does report a fall 2 to 3 months ago.  Sister works the day shift 8- 5 and numerous other sisters close by with good support.  Presented 08/18/2023 with increasing shortness of breath worse with exertion.  She reported 2 days of new cough productive of dark brown sputum.  Admission chemistries unremarkable except glucose 109 AST 59 WBC 16,200 troponin 130-123, BNP 805 blood culture no growth to date urinalysis negative.  Chest x-ray cardiac enlargement with developing pulmonary vascular congestion suggestion of mild interstitial edema.  CT angiogram of the chest no definite evidence of pulmonary vascular congestion or pulmonary emboli.  CT abdomen pelvis indeterminant heterogeneous enhancement of the spleen with mild enlargement when compared to prior.  Splenic laceration and subcapsular fluid collection were suggested.  CTA angiogram of the abdomen pelvis showed markedly abnormal appearance of the spleen with apparent splenic laceration in the midpole region.  Surrounding subcapsular hematoma.  No free fluid in the abdomen or pelvis.  No active extravasation of contrast.  Bilateral lower lobe airspace opacities, left greater than right concerning for pneumonia.  She was started on ceftriaxone /doxycycline  as well as IV fluids.  Hospital course complicated by worsening mental status and respiratory failure with evidence of myasthenia gravis exacerbation.  Neurology  consulted and eventually transferred to the ICU for intubation airway protection and mechanical ventilation, treated with 5 days of IVIG and pyridostigime and extubated 8/10.  She was offered Plex therapy by neurology but refused.  Hospital course complicated by community-acquired pneumonia completing antibiotic course as well as bouts of hypernatremia felt to be secondary to poor oral intake improved to 141.  Follow-up general surgery regards to splenic laceration/hematoma recommending supportive care hemoglobin remained stable.  Patient with persistent right knee pain x-rays unremarkable conservative care provided.  Therapy evaluations completed due to patient decreased functional mobility was admitted for a comprehensive rehab program.   Hospital Course: GWYNNE KEMNITZ was admitted to rehab 09/07/2023 for inpatient therapies to consist of PT, ST and OT at least three hours five days a week. Past admission physiatrist, therapy team and rehab RN have worked together to provide customized collaborative inpatient rehab.  Pertaining to patient's debility secondary to exacerbation of myasthenia gravis.  Completed 5-day course of IVIG.  Remained on Mestinon  and CellCept  with follow-up outpatient [facility].  SCDs for DVT prophylaxis venous Doppler studies negative.  Hospital course osteoarthritis right knee that was mild Voltaren  gel as advised with the use of Lyrica  as well as capsaicin  cream and did undergo  joint injection 09/14/2023.  Acute on chronic respiratory failure hypoxic/hypercarbia extubated 8/10 remained on BiPAP nebulizers as directed.  Patient on 2 L oxygen  nasal cannula prior to admission.  She completed course of antibiotic therapy for community-acquired pneumonia oxygen  saturations maintained.  Class IV obesity BMI 55.5 for dietary follow-up.  Blood pressure remains soft with Cozaar  held she was on low-dose Lasix  and will need outpatient follow-up.  Synthroid  ongoing for hypothyroidism.   Splenic laceration/hematoma follow-up general surgery supportive care hemoglobin hematocrit remained stable.  Mood stabilization with Abilify .  Emotional support provided.  Patient did have some vaginal bleeding consulted GYN Dr.Eure with pelvic ultrasound showing endometrial thickening with uterine fibroids and follow-up outpatient OB/GYN.   Blood pressures were monitored on TID basis and controlled and monitored     Rehab course: During patient's stay in rehab weekly team conferences were held to monitor patient's progress, set goals and discuss barriers to discharge. At admission, patient required contact-guard rolling walker 30 feet total assist lateral scoot transfers  He/She  has had improvement in activity tolerance, balance, postural control as well as ability to compensate for deficits. He/She has had improvement in functional use RUE/LUE  and RLE/LLE as well as improvement in awareness.  Working with energy conservation techniques.  Patient completed bed mobility slowly with minimal assist for trunk to upright and scooting towards edge of bed.  Completes sit to stand and stand step transfers with rolling walker supervision.  Ambulates 115 feet self-selected distance rolling walker close supervision.  Monitoring of oxygen  saturations with increased mobility.  During ADLs complete stand pivot to the wheelchair rolling walker contact-guard, requiring x 2 attempts to stand with cueing for forward trunk lean/weight shift.  Completed bilateral upper extremity ergometer to challenge bilateral upper extremity strength and endurance needed to complete ADLs.  Supervision upper body bathing, minimal assist lower body bathing, supervision upper body dressing, moderate assist lower body dressing.  Full family teaching completed plan discharge to home       Disposition:  Discharge disposition: 06-Home-Health Care Svc        Diet: Regular  Special Instructions: No driving smoking or  alcohol  Continue supportive oxygen  therapy nasal cannula as prior to admission  Follow-up outpatient OB/GYN for endometrial thickening/uterine fibroids  Medications at discharge 1.  Tylenol  as needed 2.  Abilify  2 mg p.o. daily 3.  Seshan cream twice daily to affected area 4.  Voltaren  gel 4 g 4 times daily 5.  Synthroid  200 mcg p.o. daily 6.  Singulair  10 mg p.o. nightly 7.  CellCept  1000 mg p.o. twice daily 8.  Pepcid  20 mg twice daily 9.  MiraLAX  daily as needed hold for loose stools 10.  Lyrica  200 mg p.o. twice daily 11.  Mestinon  and 30 mg p.o. 5 times daily 12.  Zostrix cream 0.025% twice daily 13.  Lasix  20 mg daily 14.  Albuterol  inhaler 2 puffs every 4 hours as needed 15.  FiberCon 625 mg daily 16.  Vitamin B6 100 mg daily 17.  Ferrous sulfate  324 mg daily 18.  Vitamin B6  daily 19.  Remeron  15 mg nightly  30-35 minutes were spent completing discharge summary and discharge planning  Discharge Instructions     Ambulatory referral to Physical Medicine Rehab   Complete by: As directed    Moderate complexity follow-up 1 to 2 weeks myasthenia gravis        Follow-up Information     Lovorn, Duwaine, MD Follow up.   Specialty: Physical Medicine and  Rehabilitation Why: Office to call for appointment Contact information: [address] [place] [address] [phone]         Mehrabyan, Anahit Cheluskin, MD Follow up.   Specialties: Neurology, Radiology Why: Call for appointment Contact information: [address] [place] [address] [phone]         Minerva Rosina HERO, MD Follow up.   Specialty: Family Medicine Why: Call for appointment Contact information: [address] [facility] [address] [phone]         [facility]. Schedule an appointment as soon as possible for a visit today.   Specialty: Obstetrics and  Gynecology Why: 2 weeks post discharge for endometrial sampling Contact information: [address] [place]   [address] [phone]                Signed: Toribio PARAS Jackson Coffield 09/22/2023, 4:52 AM

## 2023-09-07 NOTE — Progress Notes (Signed)
   Inpatient Rehabilitation Admissions Coordinator   I have CIR bed to admit her to today. I met with patient at bedside and called her sister, Dickey. Acute team and TOC made aware. I will make the arrangements.  Heron Leavell, RN, MSN Rehab Admissions Coordinator [phone] 09/07/2023 10:38 AM

## 2023-09-07 NOTE — Progress Notes (Addendum)
 Babs Arthea DASEN, MD  Physician Physical Medicine and Rehabilitation   PMR Pre-admission    Signed   Date of Service: 09/03/2023  4:28 PM  Related encounter: ED to Hosp-Admission (Discharged) from 08/18/2023 in [place] [facility] Unit   Signed     Expand All Collapse All  Show:Clear all [x] Written[x] Templated[] Copied  Added by: [x] Alison Heron MATSU, RN  [] Hover for details PMR Admission Coordinator Pre-Admission Assessment   Patient: [name] is an 58 y.o., female MRN: [id number] DOB: 07-12-1965 Height: 5' (152.4 cm) Weight: 129 kg                                                                                                                                                  Insurance Information HMO: yes    PPO:      PCP:      IPA:      80/20:      OTHER:  PRIMARY: [employer] HMO      Policy#: [id number]      Subscriber: pt CM Name: Serene Solian      Phone#: [phone] ext [phone]    Fax#: [phone] Pre-Cert#: [id number] approved for 7 days 8/19 until 8/26  f/u with Norvel Maiden phone [phone] ext [phone] fax [phone]    Employer:  Benefits:  Phone #: [phone]     Name: 8/14 Eff. Date: 01/20/23     Deduct: none      Out of Pocket Max: $9350      Life Max: none  CIR: $2185 co pay per admit      SNF: no co pay  days 1 until 20; $214 co pay per day days 21 until 100 Outpatient: 80%     Co-Pay: 20% Home Health: 100%      Co-Pay:  DME: 80%     Co-Pay: 20% Providers: In network  SECONDARY: Medicaid of [place]      Policy#: [id number]      Phone#: [phone] Verified on passport one source on 8/13 MADQY   Financial Counselor:       Phone#:    The "Data Collection Information Summary" for patients in Inpatient Rehabilitation Facilities with attached "Privacy Act Statement-Health Care Records" was provided and verbally reviewed with: Patient and Family   Emergency Contact Information Contact Information       Name Relation Home Work  Mobile    Kildeer Sister     [phone]    Wright,Betty Sister     [phone]         Other Contacts   None on File      Current Medical History  Patient Admitting Diagnosis: Myasthenia Gravis   History of Present Illness: 58 yo female with history of myasthenia gravis, moderate persistent asthma, chronic hypoxic and hypercarbic respiratory failure on O2 at 2 liters [place] at  baseline, history of PE no longer on anticoagulation, HTN, hypothyroidism, depression, morbid obesity , OSA on CPAP who presented to ED on 08/18/23 for shortness of breath.    Work up with CT chest as well as CT abdomen and pelvis. CT abdomen demonstrated equivocal findings for possible splenic laceration versus heterogeneous enhancement. Surgery consulted and opted for conservative management. She developed change in mental status on 8/6 with increased weakness, increased secretions an inability to swallow. Worsening hypoxic and hypercapnic respiratory failure. PCCM consulted. Had not worn her CPAP overnight before. Placed on BIPAP and to ICU due to concerns for worsening bulbar symptoms and impending respiratory failure. Neurology consulted and opted to begin IVIG x 5 days and intubated on 8/6.  Cortrak placed. Extubated on 8/10.    On Cellcept  and Mestinon . Plan BIPAP as needed and at HS. Neurology offered PLEX on 8/12 but patient requesting PT and OT at this time. Will plan OP follow up in [facility] clinic for she has not been to [facility] clinic since 2023, to see either Dr Tonita Blanch or Dr Venetia Potters. Switched to CPAP rather than Bipap at HS. Cortrak removed.    Patient's medical record from [facility] has been reviewed by the rehabilitation admission coordinator and physician.   Past Medical History      Past Medical History:  Diagnosis Date   Asthma     Depression     Heart murmur     Myasthenia gravis (HCC)     Obesity     Pulmonary embolism (HCC)     Sleep apnea     Thyroid  disease           Has the patient had major surgery during 100 days prior to admission? No   Family History  family history is not on file.   Current Medications   Current Medications    Current Facility-Administered Medications:    acetaminophen  (TYLENOL ) tablet 650 mg, 650 mg, Oral, Q6H PRN, 650 mg at 09/03/23 1747 **OR** acetaminophen  (TYLENOL ) suppository 650 mg, 650 mg, Rectal, Q6H PRN, Olalere, Adewale A, MD   albuterol  (PROVENTIL ) (2.5 MG/3ML) 0.083% nebulizer solution 2.5 mg, 2.5 mg, Nebulization, Q6H PRN, Patel, Vishal R, MD, 2.5 mg at 09/03/23 1619   albuterol  (PROVENTIL ) (2.5 MG/3ML) 0.083% nebulizer solution 2.5 mg, 2.5 mg, Nebulization, BID, Sheikh, Omair Latif, DO, 2.5 mg at 09/07/23 9277   ARIPiprazole  (ABILIFY ) tablet 2 mg, 2 mg, Oral, Daily, Olalere, Adewale A, MD, 2 mg at 09/07/23 0815   budesonide  (PULMICORT ) nebulizer solution 0.5 mg, 0.5 mg, Nebulization, BID, Desai, Rahul P, PA-C, 0.5 mg at 09/07/23 9277   capsaicin  (ZOSTRIX) 0.025 % cream, , Topical, BID, Nooruddin, Saad, MD, Given at 09/07/23 0959   Chlorhexidine  Gluconate Cloth 2 % PADS 6 each, 6 each, Topical, Daily, Sheikh, Omair Latif, DO, 6 each at 09/07/23 1000   diclofenac  Sodium (VOLTAREN ) 1 % topical gel 4 g, 4 g, Topical, QID, Briana Elgin LABOR, MD, 4 g at 09/07/23 9040   hydrALAZINE  (APRESOLINE ) injection 10 mg, 10 mg, Intravenous, Q6H PRN, Rashid, Farhan, MD   levothyroxine  (SYNTHROID ) tablet 200 mcg, 200 mcg, Oral, QAC breakfast, Olalere, Adewale A, MD, 200 mcg at 09/07/23 0618   lip balm (CARMEX) ointment, , Topical, PRN, Byrum, Robert S, MD   montelukast  (SINGULAIR ) tablet 10 mg, 10 mg, Oral, QHS, Paytes, Austin A, RPH, 10 mg at 09/06/23 2105   mupirocin  cream (BACTROBAN ) 2 %, , Topical, BID, Rashid, Farhan, MD, Given at 09/07/23 1000  mycophenolate  (CELLCEPT ) capsule 1,000 mg, 1,000 mg, Oral, BID, Olalere, Adewale A, MD, 1,000 mg at 09/07/23 0816   ondansetron  (ZOFRAN ) tablet 4 mg, 4 mg, Oral, Q6H PRN **OR**  ondansetron  (ZOFRAN ) injection 4 mg, 4 mg, Intravenous, Q6H PRN, Olalere, Adewale A, MD   Oral care mouth rinse, 15 mL, Mouth Rinse, 4 times per day, Shelah Lamar RAMAN, MD, 15 mL at 09/07/23 0816   Oral care mouth rinse, 15 mL, Mouth Rinse, PRN, Byrum, Robert S, MD   pantoprazole  (PROTONIX ) EC tablet 40 mg, 40 mg, Oral, Daily, Paytes, Austin A, RPH, 40 mg at 09/07/23 0815   polyethylene glycol (MIRALAX  / GLYCOLAX ) packet 17 g, 17 g, Oral, Daily, Olalere, Adewale A, MD, 17 g at 09/05/23 0955   pregabalin  (LYRICA ) capsule 200 mg, 200 mg, Oral, BID, Hoffman, Paul W, NP, 200 mg at 09/07/23 0815   pyridostigmine  (MESTINON ) tablet 30 mg, 30 mg, Oral, 5 X Daily, Olalere, Adewale A, MD, 30 mg at 09/07/23 0816   sodium chloride  flush (NS) 0.9 % injection 10-40 mL, 10-40 mL, Intracatheter, Q12H, Ramaswamy, Murali, MD, 10 mL at 09/07/23 0817   sodium chloride  flush (NS) 0.9 % injection 10-40 mL, 10-40 mL, Intracatheter, PRN, Geronimo Amel, MD   sodium chloride  flush (NS) 0.9 % injection 3 mL, 3 mL, Intravenous, Q6H, Pawar, Rahul, MD, 3 mL at 09/07/23 0817     Patients Current Diet:  Diet Order                  Diet regular Fluid consistency: Thin  Diet effective now                       Precautions / Restrictions Precautions Precautions: Fall Precaution/Restrictions Comments: intubated Restrictions Weight Bearing Restrictions Per Provider Order: No    Has the patient had 2 or more falls or a fall with injury in the past year?No   Prior Activity Level Limited Community (1-2x/wk): Indepenent in home without AD, sisters assist with some adls in past few weeks. Sponge bathes at baseline. Painful feet since august 2024   Prior Functional Level Prior Function Prior Level of Function : Independent/Modified Independent Mobility Comments: indep, no AD, denies falls ADLs Comments: sister assists with IADLs   Self Care: Did the patient need help bathing, dressing, using the toilet or eating?   Independent   Indoor Mobility: Did the patient need assistance with walking from room to room (with or without device)? Independent   Stairs: Did the patient need assistance with internal or external stairs (with or without device)? Independent   Functional Cognition: Did the patient need help planning regular tasks such as shopping or remembering to take medications? Independent   Patient Information Are you of Hispanic, Latino/a,or Spanish origin?: A. No, not of Hispanic, Latino/a, or Spanish origin What is your race?: B. Black or African American Do you need or want an interpreter to communicate with a doctor or health care staff?: 0. No   Patient's Response To:  Health Literacy and Transportation Is the patient able to respond to health literacy and transportation needs?: Yes Health Literacy - How often do you need to have someone help you when you read instructions, pamphlets, or other written material from your doctor or pharmacy?: Never In the past 12 months, has lack of transportation kept you from medical appointments or from getting medications?: No In the past 12 months, has lack of transportation kept you from meetings, work, or from getting things  needed for daily living?: No   Home Assistive Devices / Equipment Home Equipment: Other (comment) (home O2)   Prior Device Use: Indicate devices/aids used by the patient prior to current illness, exacerbation or injury? None of the above   Current Functional Level Cognition   Orientation Level: Oriented X4    Extremity Assessment (includes Sensation/Coordination)   Upper Extremity Assessment: Defer to OT evaluation  Lower Extremity Assessment: RLE deficits/detail, LLE deficits/detail RLE Deficits / Details: grossly 3-/5 LLE Deficits / Details: grossly 3-/5     ADLs   Overall ADL's : Needs assistance/impaired Eating/Feeding: Set up, Sitting Grooming: Wash/dry hands, Wash/dry face, Oral care, Set up, Sitting Upper Body  Bathing: Contact guard assist, Sitting Upper Body Bathing Details (indicate cue type and reason): on EOB Lower Body Bathing: Moderate assistance, Sit to/from stand Lower Body Bathing Details (indicate cue type and reason): assistance with peri area back Upper Body Dressing : Minimal assistance, Sitting Upper Body Dressing Details (indicate cue type and reason): gown Lower Body Dressing: Maximal assistance, Sitting/lateral leans Lower Body Dressing Details (indicate cue type and reason): socks Toilet Transfer: Total assistance, +2 for safety/equipment, +2 for physical assistance Toilet Transfer Details (indicate cue type and reason): simulated. Able to STS with mod A +2, unable to progress to stepping Toileting- Clothing Manipulation and Hygiene: Total assistance, +2 for physical assistance, +2 for safety/equipment, Sit to/from stand Functional mobility during ADLs: Moderate assistance, +2 for physical assistance, +2 for safety/equipment General ADL Comments: focused on sitting EOB and sit to stands     Mobility   Overal bed mobility: Needs Assistance Bed Mobility: Supine to Sit Rolling: Mod assist Supine to sit: HOB elevated, Used rails, Min assist Sit to supine: Max assist General bed mobility comments: not tested, pt received & left sitting in recliner     Transfers   Overall transfer level: Needs assistance Equipment used: Rolling walker (2 wheels) Transfers: Sit to/from Stand Sit to Stand: Supervision Bed to/from chair/wheelchair/BSC transfer type:: Step pivot Step pivot transfers: Min assist  Lateral/Scoot Transfers: Total assist General transfer comment: sit>stand from recliner with good ability to push to standing, poor eccentric control during stand>sit on 1st attempt but improved on 2nd attempt with cuing     Ambulation / Gait / Stairs / Wheelchair Mobility   Ambulation/Gait Ambulation/Gait assistance: Contact guard assist Gait Distance (Feet):  (30 + 11 ft) Assistive  device: Rolling walker (2 wheels) Gait Pattern/deviations: Decreased step length - left, Decreased stride length, Decreased step length - right General Gait Details: R knee instability but no buckling, extra time when turning Gait velocity: variable but overall decreased Gait velocity interpretation: <1.31 ft/sec, indicative of household ambulator Pre-gait activities: Attempted to have pt take lateral steps toward [facility], pt unable to offload each LE to march or take shuffled steps, pt sits impulsively due to fatigue/BLE weakness.     Posture / Balance Dynamic Sitting Balance Sitting balance - Comments: EOB Balance Overall balance assessment: Needs assistance Sitting-balance support: Feet supported Sitting balance-Leahy Scale: Fair Sitting balance - Comments: EOB Standing balance support: Bilateral upper extremity supported, During functional activity, Reliant on assistive device for balance Standing balance-Leahy Scale: Poor Standing balance comment: reliant on RW for support due to RLE knee pain     Special considerations/ Life events Bariatric Baseline home O2 at 2 liters [place] and CPAP at HS Needs OP setup for [place] [facility], has not been to [facility] since 2023      Previous Home Environment  Living Arrangements:  (Lives with sister, [name])  Lives With: Family (Sister, Engineer, drilling) Available Help at Discharge:  (31 yo Mom and mutiple sisters can assist at discharge) Type of Home: House Home Layout: One level Home Access: Stairs to enter Entrance Stairs-Rails: Right, Left (5 to 7 steps) Bathroom Shower/Tub: Engineer, manufacturing systems: Standard Bathroom Accessibility: Yes How Accessible: Accessible via walker Home Care Services: No Additional Comments: sister works 8-5; she has 6 other sisters near by   Discharge Living Setting Plans for Discharge Living Setting: Patient's home, Lives with (comment) (sister, [name]) Type of Home at Discharge: House Discharge Home  Layout: One level Discharge Home Access: Stairs to enter Entrance Stairs-Rails: Right, Left Entrance Stairs-Number of Steps: 6 to 7 Discharge Bathroom Shower/Tub: Tub/shower unit Discharge Bathroom Toilet: Standard Discharge Bathroom Accessibility: Yes How Accessible: Accessible via walker Does the patient have any problems obtaining your medications?: No   Social/Family/Support Systems Contact Information: sisters Haematologist Anticipated Caregiver: 6 sisters to asisst when [name] works as well as 80 yo Mother Anticipated Industrial/product designer Information: see contacts Ability/Limitations of Caregiver: [name] works 8-5, but other sisters can rotate Public house manager Availability: 24/7 Discharge Plan Discussed with Primary Caregiver: Yes Is Caregiver In Agreement with Plan?: Yes Does Caregiver/Family have Issues with Lodging/Transportation while Pt is in Rehab?: No   Goals Patient/Family Goal for Rehab: supervision PT, supervision to min OT, supervision SLP Expected length of stay: elos 7 to 10 days Pt/Family Agrees to Admission and willing to participate: Yes Program Orientation Provided & Reviewed with Pt/Caregiver Including Roles  & Responsibilities: Yes   Decrease burden of Care through IP rehab admission: n/a   Possible need for SNF placement upon discharge:not anticipated   Patient Condition: This patient's condition remains as documented in the consult dated 09/02/23, in which the Rehabilitation Physician determined and documented that the patient's condition is appropriate for intensive rehabilitative care in an inpatient rehabilitation facility. Will admit to inpatient rehab today.   Preadmission Screen Completed By:  Alison Heron Lot, RN MSN 09/07/2023 10:43 AM ______________________________________________________________________   Discussed status with Dr. Babs on 09/07/23 at 1042 and received approval for admission today.   Admission Coordinator:   Alison Heron Lot RN MSN time [id number] Date 09/07/23            Revision History

## 2023-09-07 NOTE — Progress Notes (Signed)
 LA TL PICC removed per protocol per MD order. Manual pressure applied for 3 mins. Vaseline gauze, gauze, and Tegaderm applied over insertion site. No bleeding or swelling noted. Instructed patient to remain in bed for thirty mins. Educated patient about S/S of infection and when to call MD; no heavy lifting or pressure on left side for 24 hours; keep dressing dry and intact for 24 hours. Pt verbalized comprehension.

## 2023-09-07 NOTE — Progress Notes (Addendum)
 PROGRESS NOTE    [name]  FMW:[id number] DOB: 09/22/1965 DOA: 08/18/2023 PCP: Minerva Rosina HERO, MD   Brief Narrative: [name] is a 58 y.o. female with a history of [place] gravis, moderate persistent asthma, chronic respite failure with hypoxia and hypercarbia on 2 L/min of oxygen , pulmonary embolism, hypertension, hypothyroidism, depression, morbid obesity, OSA on CPAP.  Patient presented secondary to shortness of breath and was found to have evidence of an acquired pneumonia and started on antibiotics.  During hospitalization, patient developed worsening mental status and respiratory status with evidence of myasthenia gravis exacerbation.  Neurology was consulted.  Patient was initially managed on BiPAP but eventually required transfer to ICU for intubation and mechanical ventilation.  Patient treated with 5 days of IVIG with improvement in symptoms.   Assessment and Plan:  Myasthenia gravis exacerbation Occurred during hospitalization.  Neurology consulted.  Patient found to have hypercarbia and was placed on BiPAP.  Pulmonary critical care medicine was consulted and patient was transferred to ICU and eventually intubated.  Patient was treated with IVIG for total of 5 days by neurology with improvement of symptoms. -Continue pyridostigmine  -Remove PICC prior to discharge  Community acquired pneumonia Present on admission. Patient started empirically on ceftriaxone  and doxycycline .  Patient completed course.  Acute on chronic respiratory failure with hypoxia and hypercarbia Acute respite failure secondary to pneumonia in addition to myasthenia gravis exacerbation.  Complicated by underlying OSA/OHS and asthma.  Patient was initially managed on BiPAP for hypercarbia, however she was subsequently intubated on 8/6 for mechanical ventilation in the ICU.  Patient was successfully extubated on 8/10. Using 3 L/min of oxygen . -Wean to 2 L/min of oxygen   Right knee  arthritis New problem. Not related to recent ambulation, per patient. No known injury. X-ray significant for arthritis and small effusion. Pain has improved with ice pack and Voltaren  gel -Continue Tylenol  as needed -Continue ice pack as needed -Continue voltaren  gel  Peripheral neuropathy Vitamin B12 elevated. Patient started on Lyrica . LE venous duplex scan negative for DVT. -Continue Lyrica  and capsaicin  cream  Hypernatremia Mild. Likely related to poor oral intake. Appears to be resolved.  Hypokalemia Mild. Resolved with potassium supplementation.  Hyperkalemia Resolved.  Primary hypertension Some hypotension noted, however this was overnight. Patient is on losartan . -Hold losartan ; may consider restarting at a lower dose  Hypothyroidism -Continue Synthroid   Depression Anxiety -Continue Abilify   Splenic laceration with hematoma Noted on CT imaging.  Unclear cause.  Patient does note history of fall about 2 to 3 months ago.  General surgery was consulted this admission and recommended supportive care.  Normocytic anemia Noted. Hemoglobin stable.  OSA -Continue CPAP  Obesity, class III Estimated body mass index is 55.54 kg/m as calculated from the following:   Height as of this encounter: 5' (1.524 m).   Weight as of this encounter: 129 kg.   DVT prophylaxis: SCDs Code Status:   Code Status: Full Code Family Communication: None at bedside. Disposition Plan: Discharge to inpatient rehabilitation once bed is available in addition to stable blood pressure and stable labs today.   Consultants:  General surgery Neurology PCCM  Procedures:  Intubation/extubation Cortrak feeding tube placement  Antimicrobials: Doxycycline  Ceftriaxone     Subjective: No issues noted from documentation overnight.  Objective: BP (!) 108/48   Pulse 70   Temp 97.7 F (36.5 C)   Resp 18   Ht 5' (1.524 m)   Wt 129 kg   SpO2 100%   BMI 55.54 kg/m  Examination:  General exam: Appears calm and comfortable Respiratory system: Respiratory effort normal.    Data Reviewed: I have personally reviewed following labs and imaging studies  CBC Lab Results  Component Value Date   WBC 7.3 08/31/2023   RBC 3.69 (L) 08/31/2023   HGB 10.7 (L) 08/31/2023   HCT 35.6 (L) 08/31/2023   MCV 96.5 08/31/2023   MCH 29.0 08/31/2023   PLT 278 08/31/2023   MCHC 30.1 08/31/2023   RDW 14.9 08/31/2023   LYMPHSABS 1.0 08/31/2023   MONOABS 0.7 08/31/2023   EOSABS 0.3 08/31/2023   BASOSABS 0.1 08/31/2023     Last metabolic panel Lab Results  Component Value Date   NA 141 08/31/2023   K 4.1 08/31/2023   CL 96 (L) 08/31/2023   CO2 37 (H) 08/31/2023   BUN 16 08/31/2023   CREATININE 0.55 08/31/2023   GLUCOSE 119 (H) 08/31/2023   GFRNONAA >60 08/31/2023   GFRAA >60 12/21/2018   CALCIUM  9.1 08/31/2023   PHOS 3.4 08/31/2023   PROT 7.4 08/31/2023   ALBUMIN 2.6 (L) 08/31/2023   BILITOT 0.7 08/31/2023   ALKPHOS 39 08/31/2023   AST 33 08/31/2023   ALT 25 08/31/2023   ANIONGAP 8 08/31/2023    GFR: Estimated Creatinine Clearance: 95.5 mL/min (by C-G formula based on SCr of 0.55 mg/dL).  No results found for this or any previous visit (from the past 240 hours).     Radiology Studies: No results found.     LOS: 20 days    Elgin Lam, MD [facility] 09/07/2023, 8:21 AM   If 7PM-7AM, please contact night-coverage [email or web address]

## 2023-09-07 NOTE — Progress Notes (Signed)
 Barbara Joesph BROCKS, DO  Physician Physical Medicine and Rehabilitation   Consult Note    Signed   Date of Service: 09/02/2023 12:45 PM  Related encounter: ED to Hosp-Admission (Discharged) from 08/18/2023 in [place] [facility] Medical Unit   Signed     Expand All Collapse All  Show:Clear all [x] Written[x] Templated[] Copied  Added by: [x] Barbara Joesph BROCKS, DO  [] Hover for details          Physical Medicine and Rehabilitation Consult Reason for Consult: Evaluate appropriateness for Inpatient Rehab Referring Physician: Dr. Elgin Copa       HPI: [name] is a 57 y.o. female with PMHx of  has a past medical history of Asthma, Depression, Heart murmur, Myasthenia gravis (HCC), Obesity, Pulmonary embolism (HCC), Sleep apnea, and Thyroid  disease. . They were admitted to [facility] on 08/18/2023 for shortness of breath.  On intake, she endorsed 2 days of cough with productive brown sputum, compliance with home oxygen  and CPAP, and bilateral upper abdominal pain.  ED evaluation was significant for leukocytosis 16.2, troponin 130, lactic acid 2.6.  CTA chest negative for PE, however complicated by adjacent atelectasis.  CT abdomen and pelvis showed splenic enhancement with some enlargement, possible subcapsular fluid collections/splenic laceration, and mild patchy opacities in the bilateral lower lobes concerning for pneumonia.  She was started on IV fluids and ceftriaxone , admitted to the hospitalist service.  Hospitalization was complicated by worsening mental status and respiratory failure with evidence of myasthenia gravis exacerbation.  Neurology was consulted and patient was eventually transferred to the ICU for intubation and mechanical ventilation, treated with 5 days of IVIG and pyridostigmine , and extubated on 8-10.  She was offered Plex therapy by neurology but refused.  She is currently requiring nighttime BiPAP and on 2 L of oxygen .  Hospitalization was further  complicated by community-acquired pneumonia completing antibiotics ceftriaxone /doxycycline , right knee pain, bilateral neuropathic pain, hyponatremia, hypokalemia, hypertension, depression/anxiety, splenic laceration with hematoma with surgery recommending supportive care, normocytic anemia, and obesity.[name]  PM&R was consulted to evaluate appropriateness for IPR admission.    Per documentation, the patient lives in a single-story home with steps to enter, lives with her family including a sister who works during 8-5 and 6 other sisters nearby.  At baseline, she is modified independent of ADLs but requires assistance from her sisters for IADLs.  She is independent of mobility without an assistive device at baseline.  Currently, she is min assist for supine to sit, mod assist +2 for transfers, and can ambulate 12 feet with a rolling walker at a contact-guard assist level.  She is limited by dizziness with walking and shortness of breath with strenuous activities.     Review of Systems  Constitutional:  Positive for malaise/fatigue. Negative for chills and fever.  Eyes:  Positive for double vision.  Respiratory:  Positive for cough and shortness of breath.   Cardiovascular:  Negative for chest pain and palpitations.  Gastrointestinal:  Negative for nausea and vomiting.  Musculoskeletal:  Negative for falls.  Neurological:  Positive for dizziness and weakness.       Past Medical History:  Diagnosis Date   Asthma     Depression     Heart murmur     Myasthenia gravis (HCC)     Obesity     Pulmonary embolism (HCC)     Sleep apnea     Thyroid  disease               Past  Surgical History:  Procedure Laterality Date   BREAST SURGERY   06/2019    breast reduction   THYROID  SURGERY       TONSILLECTOMY       TUBAL LIGATION            History reviewed. No pertinent family history.     Social History:  reports that she has quit smoking. She has never used smokeless tobacco. She reports  current alcohol use. She reports that she does not use drugs. Allergies:  Allergies       Allergies  Allergen Reactions   Magnesium  Sulfate Other (See Comments)      Myasthenia gravis. Consider withholding replacement unless severely low.             Medications Prior to Admission  Medication Sig Dispense Refill   acetaminophen  (TYLENOL ) 500 MG tablet Take 500 mg by mouth every 6 (six) hours as needed for mild pain.       albuterol  (PROVENTIL ) (2.5 MG/3ML) 0.083% nebulizer solution Take 2.5 mg by nebulization every 6 (six) hours as needed for wheezing or shortness of breath.       albuterol  (VENTOLIN  HFA) 108 (90 Base) MCG/ACT inhaler Inhale 2 puffs into the lungs every 4 (four) hours as needed for wheezing or shortness of breath.        ARIPiprazole  (ABILIFY ) 2 MG tablet Take 2 mg by mouth daily.       cyanocobalamin (VITAMIN B12) 1000 MCG tablet Take 1 tablet by mouth daily.       famotidine  (PEPCID ) 20 MG tablet Take 1 tablet (20 mg total) by mouth 2 (two) times daily. 30 tablet 0   ferrous sulfate 324 MG TBEC Take 324 mg by mouth daily with breakfast.       levothyroxine  (SYNTHROID ) 200 MCG tablet Take 200 mcg by mouth daily before breakfast.       losartan  (COZAAR ) 50 MG tablet Take 50 mg by mouth daily.       mirtazapine  (REMERON ) 15 MG tablet Take 15 mg by mouth at bedtime.       montelukast  (SINGULAIR ) 10 MG tablet Take 10 mg by mouth at bedtime.       mycophenolate  (CELLCEPT ) 500 MG tablet Take 1,000 mg by mouth 2 (two) times daily.   0   pregabalin  (LYRICA ) 200 MG capsule Take 200 mg by mouth 2 (two) times daily.       pyridostigmine  (MESTINON ) 60 MG tablet Take 30 mg by mouth 5 (five) times daily.    0   Pyridoxine HCl (VITAMIN B6 PO) Take 1,000 mcg by mouth daily.              Home: Home Living Family/patient expects to be discharged to:: Private residence Living Arrangements:  (Lives with sister, [name]) Available Help at Discharge:  (58 yo [name] and mutiple sisters  can assist at discharge) Type of Home: House Home Access: Stairs to enter Entrance Stairs-Rails: Right, Left (5 to 7 steps) Home Layout: One level Bathroom Shower/Tub: Armed forces operational officer Accessibility: Yes Home Equipment: Other (comment) (home O2) Additional Comments: sister works 8-5; she has 6 other sisters near by  Lives With: Family (Sister, Engineer, drilling)  Functional History: Prior Function Prior Level of Function : Independent/Modified Independent Mobility Comments: indep, no AD, denies falls ADLs Comments: sister assists with IADLs Functional Status:  Mobility: Bed Mobility Overal bed mobility: Independent Bed Mobility: Supine to Sit Rolling: Mod assist Supine to sit: HOB elevated, Used  rails, Min assist Sit to supine: Max assist General bed mobility comments: Increased time and assist with LE management schooting to L EOB. Max cueing for sequencing. Transfers Overall transfer level: Needs assistance Equipment used: Rolling walker (2 wheels) Transfers: Sit to/from Stand, Bed to chair/wheelchair/BSC Sit to Stand: +2 physical assistance, Mod assist, Min assist, From elevated surface Bed to/from chair/wheelchair/BSC transfer type:: Step pivot Step pivot transfers: Mod assist  Lateral/Scoot Transfers: Total assist General transfer comment: Performed 2 sit to stands (1 from elevated EOB and 1 from recliner). ModA for elevated EOB. +2 MinA from recliner Ambulation/Gait Ambulation/Gait assistance: Contact guard assist Gait Distance (Feet): 12 Feet Assistive device: Rolling walker (2 wheels) Gait Pattern/deviations: Step-to pattern, Decreased stride length, Shuffle, Trunk flexed General Gait Details: Dizziness with walking as pt states she was holding her breath. Cues to continue breathing even with strenuous activity Gait velocity: Decreased Gait velocity interpretation: <1.31 ft/sec, indicative of household ambulator Pre-gait activities:  Attempted to have pt take lateral steps toward [facility], pt unable to offload each LE to march or take shuffled steps, pt sits impulsively due to fatigue/BLE weakness.   ADL: ADL Overall ADL's : Needs assistance/impaired Eating/Feeding: Set up, Bed level Grooming: Wash/dry hands, Wash/dry face, Set up, Sitting Upper Body Bathing: Contact guard assist, Sitting Lower Body Bathing: Maximal assistance Upper Body Dressing : Minimal assistance Lower Body Dressing: Total assistance Lower Body Dressing Details (indicate cue type and reason): socks Toilet Transfer: Total assistance, +2 for safety/equipment, +2 for physical assistance Toilet Transfer Details (indicate cue type and reason): simulated. Able to STS with mod A +2, unable to progress to stepping Toileting- Clothing Manipulation and Hygiene: Total assistance, +2 for physical assistance, +2 for safety/equipment, Sit to/from stand Functional mobility during ADLs: Moderate assistance, +2 for physical assistance, +2 for safety/equipment General ADL Comments: focused on sitting EOB and sit to stands   Cognition: Cognition Orientation Level: Oriented X4 Cognition Arousal: Alert Behavior During Therapy: WFL for tasks assessed/performed   Blood pressure (!) 128/54, pulse 77, temperature 98.7 F (37.1 C), resp. rate 17, height 5' (1.524 m), weight 130.5 kg, SpO2 92%. Physical Exam   PE: Patient exam limited due to x-ray studies being performed. Constitution: Appropriate appearance for age. No apparent distress  +Obese Eyes: R ptosis, exotropia; can track in all quadrants Resp: No respiratory distress. No accessory muscle usage. on RA Cardio: Well perfused appearance. No peripheral edema. Abdomen: Nondistended.  Psych: Appropriate mood and affect. Neuro: AAOx4. No apparent cognitive deficits    Neurologic Exam:   Motor exam: Moving all 4 limbs antigravity  Coordination: Fine motor coordination was normal.         Lab Results Last 24  Hours       Results for orders placed or performed during the hospital encounter of 08/18/23 (from the past 24 hours)  Glucose, capillary     Status: Abnormal    Collection Time: 09/01/23 12:54 PM  Result Value Ref Range    Glucose-Capillary 100 (H) 70 - 99 mg/dL  Glucose, capillary     Status: Abnormal    Collection Time: 09/01/23  8:30 PM  Result Value Ref Range    Glucose-Capillary 120 (H) 70 - 99 mg/dL  Glucose, capillary     Status: Abnormal    Collection Time: 09/02/23 12:12 AM  Result Value Ref Range    Glucose-Capillary 128 (H) 70 - 99 mg/dL  Glucose, capillary     Status: Abnormal    Collection Time: 09/02/23  3:34 AM  Result Value Ref Range    Glucose-Capillary 131 (H) 70 - 99 mg/dL  Glucose, capillary     Status: Abnormal    Collection Time: 09/02/23  8:11 AM  Result Value Ref Range    Glucose-Capillary 127 (H) 70 - 99 mg/dL  Glucose, capillary     Status: Abnormal    Collection Time: 09/02/23 12:17 PM  Result Value Ref Range    Glucose-Capillary 110 (H) 70 - 99 mg/dL      Imaging Results (Last 48 hours)  No results found.     Assessment/Plan: Diagnosis: Debility secondary to exacerbation of myasthenia gravis Does the need for close, 24 hr/day medical supervision in concert with the patient's rehab needs make it unreasonable for this patient to be served in a less intensive setting? Yes Co-Morbidities requiring supervision/potential complications: Myasthenia gravis exacerbation,community-acquired pneumonia/acute hypoxic respiratory failure, right knee pain, bilateral neuropathic pain, hyponatremia, hypokalemia, hypertension, depression/anxiety, splenic laceration with hematoma with surgery recommending supportive care, normocytic anemia, and obesity Due to safety, skin/wound care, disease management, medication administration, and patient education, does the patient require 24 hr/day rehab nursing? Yes Does the patient require coordinated care of a physician, rehab  nurse, therapy disciplines of PT, OT to address physical and functional deficits in the context of the above medical diagnosis(es)? Yes Addressing deficits in the following areas: balance, endurance, locomotion, strength, transferring, bathing, dressing, feeding, grooming, toileting, and psychosocial support Can the patient actively participate in an intensive therapy program of at least 3 hrs of therapy per day at least 5 days per week? Yes The potential for patient to make measurable gains while on inpatient rehab is excellent Anticipated functional outcomes upon discharge from inpatient rehab are supervision  with PT, supervision with OT Estimated rehab length of stay to reach the above functional goals is: 7-10 days Anticipated discharge destination: Home Overall Rehab/Functional Prognosis: good   POST ACUTE RECOMMENDATIONS: This patient's condition is appropriate for continued rehabilitative care in the following setting: CIR Patient has agreed to participate in recommended program. Yes Note that insurance prior authorization may be required for reimbursement for recommended care.       I have personally performed a face to face diagnostic evaluation of this patient. Additionally, I have examined the patient's medical record including any pertinent labs and radiographic images. If the physician assistant has documented in this note, I have reviewed and edited or otherwise concur with the physician assistant's documentation.   Thanks,   Joesph JAYSON Likes, DO 09/02/2023          Routing History

## 2023-09-07 NOTE — Plan of Care (Signed)
  Problem: Consults Goal: RH GENERAL PATIENT EDUCATION Description: See Patient Education module for education specifics. Outcome: Progressing   Problem: RH BOWEL ELIMINATION Goal: RH STG MANAGE BOWEL WITH ASSISTANCE Description: STG Manage Bowel with mod I Assistance. Outcome: Progressing Goal: RH STG MANAGE BOWEL W/MEDICATION W/ASSISTANCE Description: STG Manage Bowel with Medication with mod I  Assistance. Outcome: Progressing   Problem: RH SAFETY Goal: RH STG ADHERE TO SAFETY PRECAUTIONS W/ASSISTANCE/DEVICE Description: STG Adhere to Safety Precautions With cues Assistance/Device. Outcome: Progressing   Problem: RH PAIN MANAGEMENT Goal: RH STG PAIN MANAGED AT OR BELOW PT'S PAIN GOAL Description: Pain < 4 with prns Outcome: Progressing   Problem: RH KNOWLEDGE DEFICIT GENERAL Goal: RH STG INCREASE KNOWLEDGE OF SELF CARE AFTER HOSPITALIZATION Description: Patient and sister will be able to manage care at discharge using educational resources for medications and dietary modification independently Outcome: Progressing

## 2023-09-07 NOTE — Progress Notes (Signed)
 Inpatient Rehabilitation Admission Medication Review by a Pharmacist  A complete drug regimen review was completed for this patient to identify any potential clinically significant medication issues.  High Risk Drug Classes Is patient taking? Indication by Medication  Antipsychotic Yes Aripiprazole  - mood, agitation  Anticoagulant No   Antibiotic No   Opioid No   Antiplatelet No   Hypoglycemics/insulin  No   Vasoactive Medication No   Chemotherapy No   Other Yes Ondansetron  prn N/V Albuterol  prn SOB Budesonide , montelukast - COPD, SOB Levothyroxine  - low thyroid  Pregabalin  - pain Pantoprazole  - Reflux   Mycophenolate , pyridostigmine -Myasthenia gravis     Type of Medication Issue Identified Description of Issue Recommendation(s)  Drug Interaction(s) (clinically significant)     Duplicate Therapy     Allergy     No Medication Administration End Date     Incorrect Dose     Additional Drug Therapy Needed     Significant med changes from prior encounter (inform family/care partners about these prior to discharge). Mirtazapine  15 mg po HS On PTA, resume if and when appropriate  Other       Clinically significant medication issues were identified that warrant physician communication and completion of prescribed/recommended actions by midnight of the next day:  No  Name of provider notified for urgent issues identified:   Provider Method of Notification:     Pharmacist comments: None  Time spent performing this drug regimen review (minutes):  20 minutes  Thank you. Olam Monte, PharmD

## 2023-09-07 NOTE — Discharge Instructions (Addendum)
 [name],  You were in the hospital with pneumonia but developed a myasthenia gravis exacerbation requiring ICU admission and intubation. Thankfully you have improved significantly. You have been recommended for discharge to rehab. Please take your medications as prescribed.

## 2023-09-07 NOTE — Progress Notes (Signed)
 Mobility Specialist: Progress Note   09/07/23 1200  Mobility  Activity Ambulated with assistance  Level of Assistance Contact guard assist, steadying assist  Assistive Device Front wheel walker  Distance Ambulated (ft) 40 ft  Activity Response Tolerated well  Mobility Referral Yes  Mobility visit 1 Mobility  Mobility Specialist Start Time (ACUTE ONLY) 0855  Mobility Specialist Stop Time (ACUTE ONLY) 0910  Mobility Specialist Time Calculation (min) (ACUTE ONLY) 15 min    Pt received in bed, agreeable to mobility session. ModA for bed mobility to assist with BLEs and trunk elevation. Heavy modA for STS. CGA for ambulation. C/o R knee pain, no occurrence of buckling. Also c/o feeling SOB and had a slight wheeze while trying to sit up EOB. SpO2 95% on 2LO2 at EOB. Ambulated to the door and around the bed to the chair. SpO2 87% on 2LO2 immediately after sitting in the chair; pt recovered to SpO2 90% with seated break and PLB. Left in chair with all needs met, call bell in reach.   Ileana Lute Mobility Specialist Please contact via SecureChat or Rehab office at [phone]

## 2023-09-08 DIAGNOSIS — G7000 Myasthenia gravis without (acute) exacerbation: Secondary | ICD-10-CM | POA: Diagnosis not present

## 2023-09-08 LAB — COMPREHENSIVE METABOLIC PANEL WITH GFR
ALT: 16 U/L (ref 0–44)
AST: 18 U/L (ref 15–41)
Albumin: 2.9 g/dL — ABNORMAL LOW (ref 3.5–5.0)
Alkaline Phosphatase: 44 U/L (ref 38–126)
Anion gap: 7 (ref 5–15)
BUN: 11 mg/dL (ref 6–20)
CO2: 40 mmol/L — ABNORMAL HIGH (ref 22–32)
Calcium: 9.4 mg/dL (ref 8.9–10.3)
Chloride: 96 mmol/L — ABNORMAL LOW (ref 98–111)
Creatinine, Ser: 0.58 mg/dL (ref 0.44–1.00)
GFR, Estimated: >60 mL/min (ref >60)
Glucose, Bld: 98 mg/dL (ref 70–99)
Potassium: 4.5 mmol/L (ref 3.5–5.1)
Sodium: 143 mmol/L (ref 135–145)
Total Bilirubin: 0.7 mg/dL (ref 0.0–1.2)
Total Protein: 7.4 g/dL (ref 6.5–8.1)

## 2023-09-08 LAB — CBC WITH DIFFERENTIAL/PLATELET
Abs Immature Granulocytes: 0.01 K/uL (ref 0.00–0.07)
Basophils Absolute: 0 K/uL (ref 0.0–0.1)
Basophils Relative: 1 %
Eosinophils Absolute: 0.2 K/uL (ref 0.0–0.5)
Eosinophils Relative: 2 %
HCT: 35.7 % — ABNORMAL LOW (ref 36.0–46.0)
Hemoglobin: 10.7 g/dL — ABNORMAL LOW (ref 12.0–15.0)
Immature Granulocytes: 0 %
Lymphocytes Relative: 16 %
Lymphs Abs: 1 K/uL (ref 0.7–4.0)
MCH: 28.9 pg (ref 26.0–34.0)
MCHC: 30 g/dL (ref 30.0–36.0)
MCV: 96.5 fL (ref 80.0–100.0)
Monocytes Absolute: 0.7 K/uL (ref 0.1–1.0)
Monocytes Relative: 10 %
Neutro Abs: 4.7 K/uL (ref 1.7–7.7)
Neutrophils Relative %: 71 %
Platelets: 330 K/uL (ref 150–400)
RBC: 3.7 MIL/uL — ABNORMAL LOW (ref 3.87–5.11)
RDW: 14.4 % (ref 11.5–15.5)
WBC: 6.6 K/uL (ref 4.0–10.5)
nRBC: 0 % (ref 0.0–0.2)

## 2023-09-08 NOTE — Progress Notes (Signed)
 Inpatient Rehabilitation Care Coordinator Assessment and Plan Patient Details  Name: [name] MRN: [id number] Date of Birth: December 19, 1965  Today's Date: 09/08/2023  Hospital Problems: Principal Problem:   Myasthenia gravis [facility])  Past Medical History:  Past Medical History:  Diagnosis Date   Asthma    Depression    Heart murmur    Myasthenia gravis (HCC)    Obesity    Pulmonary embolism (HCC)    Sleep apnea    Thyroid  disease    Past Surgical History:  Past Surgical History:  Procedure Laterality Date   BREAST SURGERY  06/2019   breast reduction   THYROID  SURGERY     TONSILLECTOMY     TUBAL LIGATION     Social History:  reports that she has quit smoking. She has never used smokeless tobacco. She reports current alcohol use. She reports that she does not use drugs.  Family / Support Systems Marital Status: Single Patient Roles: Other (Comment) (siblings) Other Supports: [name]-sister [phone]  Dickey sister [phone]  Mom who is 90 yo and has four other sisters Anticipated Caregiver: Pt has six sister's who can assist. Pt reports [name] is retired and is there Ability/Limitations of Caregiver: Pt will have 24/7 assist between sister's someone will be with her Caregiver Availability: 24/7 Family Dynamics: Close knit family who pull together to make sure pt has her needs met. She is very close with her sister's and her mom. She has no concerns  Social History Preferred language: English Religion: Non-Denominational Cultural Background: NA Education: HS Health Literacy - How often do you need to have someone help you when you read instructions, pamphlets, or other written material from your doctor or pharmacy?: Never Writes: Yes Employment Status: Disabled Marine scientist Issues: No issues Guardian/Conservator: None-according to MD pt is capable of making her own decisions while here   Abuse/Neglect Abuse/Neglect Assessment Can Be Completed:  Yes Physical Abuse: Denies Verbal Abuse: Denies Sexual Abuse: Denies Exploitation of patient/patient's resources: Denies Self-Neglect: Denies  Patient response to: Social Isolation - How often do you feel lonely or isolated from those around you?: Rarely  Emotional Status Pt's affect, behavior and adjustment status: Pt is motivated to do well and get moving she was doing well prior to this admission. She sponge bathe prior to admission and was mobile in the household Recent Psychosocial Issues: other health issues Psychiatric History: hx-depression takes medications and feels they help. Would benefit ftom seeing neuro-psych while here Substance Abuse History: NA  Patient / Family Perceptions, Expectations & Goals Pt/Family understanding of illness & functional limitations: Pt is able to explain her helth issues and reason for hospitalization. She is doing better now and glad to be here on the rehab unit. She hopes her knee will get better and stop buckling Premorbid pt/family roles/activities: sibling, daughter, aunt, etc Anticipated changes in roles/activities/participation: resume Pt/family expectations/goals: Pt states:  I hope to do well and get mobile again.  Community Resources [employer]: None Premorbid Home Care/DME Agencies: None Transportation available at discharge: family Is the patient able to respond to transportation needs?: Yes In the past 12 months, has lack of transportation kept you from medical appointments or from getting medications?: No In the past 12 months, has lack of transportation kept you from meetings, work, or from getting things needed for daily living?: No Resource referrals recommended: Neuropsychology  Discharge Planning Living Arrangements: Other relatives Support Systems: Parent, Other relatives, Friends/neighbors Type of Residence: Private residence Insurance Resources: Media planner (specify) Multimedia programmer  Medicare) Financial  Resources: SSI, Family Support Financial Screen Referred: No Living Expenses: Lives with family Money Management: Family Does the patient have any problems obtaining your medications?: No Home Management: family Patient/Family Preliminary Plans: Return home with sister and have her other sister's assist if [name] is not able too. Pt has plenty of suport and hopes to be able to be mod/i when she goes home. Aware being evaluated today and goals being set for stay here. Care Coordinator Barriers to Discharge: Insurance for SNF coverage Care Coordinator Anticipated Follow Up Needs: HH/OP  Clinical Impression Pleasant female who is motivated to dow well and recover from her health issues. She has good supports via her sister's Aware being evaluated in therapies and goals being set and will meet on Tuesday to set a target discharge date.  Raymonde Asberry MATSU 09/08/2023, 11:38 AM

## 2023-09-08 NOTE — Evaluation (Signed)
 Occupational Therapy Assessment and Plan  Patient Details  Name: [name] MRN: [id number] Date of Birth: 02-22-1965  OT Diagnosis: muscle weakness (generalized) Rehab Potential: Rehab Potential (ACUTE ONLY): Good ELOS: 7-10 days   Today's Date: 09/08/2023 OT Individual Time: 8984-8872 OT Individual Time Calculation (min): 72 min     Hospital Problem: Principal Problem:   Myasthenia gravis (HCC)   Past Medical History:  Past Medical History:  Diagnosis Date   Asthma    Depression    Heart murmur    Myasthenia gravis (HCC)    Obesity    Pulmonary embolism (HCC)    Sleep apnea    Thyroid  disease    Past Surgical History:  Past Surgical History:  Procedure Laterality Date   BREAST SURGERY  06/2019   breast reduction   THYROID  SURGERY     TONSILLECTOMY     TUBAL LIGATION      Assessment & Plan Clinical Impression: [name] is a 58 year old right-handed female with history significant for myasthenia gravis, moderate persistent asthma, chronic hypoxic and hypercarbic respiratory failure on 2 L oxygen  nasal cannula,, OSA on CPAP, history of pulmonary emboli no longer on anticoagulation, hypertension, hypothyroidism, depression, 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 Per chart review patient lives with sister. 1 level home. Modified independent for mobility and sister assist with ADLs. Patient does report a fall 2-3 months ago. Sister works during the day 8-5 and numerous other sisters close by with good support. Presented 08/18/2023 with increasing shortness of breath/worse with exertion. She reported 2 days of new cough productive of dark brown sputum. Admission chemistries unremarkable except glucose 109, AST 59, WBC 16,200, troponin 130-123, BNP 105, blood culture no growth to date, urinalysis negative nitrite. Chest x-ray cardiac enlargement with developing pulmonary vascular congestion suggestion of mild interstitial edema. CT angio of the chest no definite evidence  of PE. CT abdomen pelvis indeterminant heterogeneous enhancement of the spleen with mild enlargement when compared to prior. Splenic laceration and subcapsular fluid collections were suggested. CTA angiogram of the abdomen pelvis showed markedly abnormal appearance of the spleen with apparent splenic laceration in the midpole region. Surrounding subcapsular hematoma. No free fluid in the abdomen or pelvis. No active extravasation of contrast. Bilateral lower lobe airspace opacities, left greater than right concerning for pneumonia. She was started on ceftriaxone /doxycycline  as well as IV fluids. Hospital course complicated by worsening mental status and respiratory failure with evidence of myasthenia gravis exacerbation. Neurology consulted and patient was eventually transferred to the ICU for intubation and airway protection and mechanical ventilation, treated with 5 days of IVIG and pyridostigmine , and extubated 8-10. She was offered Plex therapy by neurology but refused. Hospital course complicated by community-acquired pneumonia completing antibiotic course as well as bouts of hypernatremia felt to be secondary to poor oral intake improved 141. Follow-up general surgery in regards to splenic laceration/hematoma recommending supportive care and hemoglobin remained stable at 10.7. Patient with persistent right knee pain x-rays unremarkable conservative care provided. Therapy evaluations completed due to patient decreased functional mobility was admitted for a comprehensive rehab program. .  Patient transferred to CIR on 09/07/2023 .    Patient currently requires mod with basic self-care skills secondary to muscle weakness, decreased cardiorespiratoy endurance, and decreased standing balance, decreased postural control, and decreased balance strategies.  Prior to hospitalization, patient could complete ADLs with modified independent .  Patient will benefit from skilled intervention to decrease level of assist  with basic self-care skills and increase independence with  basic self-care skills prior to discharge home with care partner.  Anticipate patient will require intermittent supervision and follow up home health.  OT - End of Session Activity Tolerance: Tolerates 30+ min activity with multiple rests Endurance Deficit: Yes OT Assessment Rehab Potential (ACUTE ONLY): Good OT Barriers to Discharge: None OT Patient demonstrates impairments in the following area(s): Balance OT Basic ADL's Functional Problem(s): Grooming;Bathing;Dressing;Toileting OT Advanced ADL's Functional Problem(s): None OT Transfers Functional Problem(s): Toilet;Tub/Shower OT Additional Impairment(s): None OT Plan OT Intensity: Minimum of 1-2 x/day, 45 to 90 minutes OT Frequency: 5 out of 7 days OT Duration/Estimated Length of Stay: 7-10 days OT Treatment/Interventions: Balance/vestibular training;Discharge planning;Functional electrical stimulation;Pain management;Self Care/advanced ADL retraining;Therapeutic Activities;UE/LE Coordination activities;Cognitive remediation/compensation;Disease mangement/prevention;Functional mobility training;Patient/family education;Skin care/wound managment;Therapeutic Exercise;Community reintegration;DME/adaptive equipment instruction;Neuromuscular re-education;Psychosocial support;UE/LE Strength taining/ROM OT Self Feeding Anticipated Outcome(s): mod I OT Basic Self-Care Anticipated Outcome(s): mod I OT Toileting Anticipated Outcome(s): mod I OT Bathroom Transfers Anticipated Outcome(s): mod I OT Recommendation Recommendations for Other Services: Therapeutic Recreation consult Therapeutic Recreation Interventions: Pet therapy Patient destination: Home Follow Up Recommendations: Home health OT Equipment Recommended: Tub/shower bench;To be determined;3 in 1 bedside comode   OT Evaluation Precautions/Restrictions  Precautions Precautions: Fall Recall of Precautions/Restrictions:  Intact Restrictions Weight Bearing Restrictions Per Provider Order: No General Chart Reviewed: Yes Response to Previous Treatment: Not applicable Family/Caregiver Present: Yes Pain Pain Assessment Pain Scale: 0-10 Pain Score: 7  Pain Location: Knee Pain Orientation: Right Pain Descriptors / Indicators: Aching Home Living/Prior Functioning Home Living Living Arrangements: Other relatives Type of Home: House Home Access: Stairs to enter Secretary/administrator of Steps: 5 Home Layout: One level Bathroom Shower/Tub: Engineer, manufacturing systems: Handicapped height Bathroom Accessibility: Yes Additional Comments: sister able to provide 24/7  Lives With: Family (lives with sister Ernie) IADL History Homemaking Responsibilities: Yes Meal Prep Responsibility: No Laundry Responsibility: No Cleaning Responsibility: No Bill Paying/Finance Responsibility: No Shopping Responsibility: No Child Care Responsibility: No Current License: No Occupation: Retired Leisure and Hobbies: TV Prior Function Level of Independence: Independent with basic ADLs  Able to Take Stairs?: Reciprically (sister would assist +1) Driving: No Vocation: Retired Leisure: Hobbies-no Vision Baseline Vision/History: 1 Wears glasses Wears Glasses: Reading only Ability to See in Adequate Light: 0 Adequate Patient Visual Report: No change from baseline Vision Assessment?: Wears glasses for reading Perception  Perception: Within Functional Limits Praxis Praxis: WFL Cognition Cognition Overall Cognitive Status: Impaired/Different from baseline Arousal/Alertness: Awake/alert Orientation Level: Person;Place;Situation Person: Oriented Place: Oriented Situation: Oriented Memory: Impaired Memory Impairment: Decreased short term memory Decreased Short Term Memory: Functional basic;Verbal complex Awareness: Appears intact Problem Solving: Appears intact Safety/Judgment: Appears intact Brief Interview for  Mental Status (BIMS) Repetition of Three Words (First Attempt): 3 Temporal Orientation: Year: Correct Temporal Orientation: Month: Accurate within 5 days Temporal Orientation: Day: Correct Recall: Sock: Yes, after cueing (something to wear) Recall: Blue: Yes, no cue required Recall: Bed: Yes, no cue required BIMS Summary Score: 14 Sensation Sensation Light Touch: Impaired Detail Peripheral sensation comments: 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, hypersensitive Light Touch Impaired Details: Impaired RLE;Impaired LLE Hot/Cold: Appears Intact Proprioception: Appears Intact Stereognosis: Not tested Coordination Gross Motor Movements are Fluid and Coordinated: No Fine Motor Movements are Fluid and Coordinated: No Coordination and Movement Description: d/t debility and general weakness Motor  Motor Motor: Abnormal postural alignment and control  Trunk/Postural Assessment  Cervical Assessment Cervical Assessment: Exceptions to [facility] (forward head) Thoracic Assessment Thoracic Assessment: Exceptions to [facility] (rounded shoulders) Lumbar Assessment Lumbar Assessment: Exceptions to [facility] (posterior pelvic  tilt) Postural Control Postural Control: Deficits on evaluation Righting Reactions: delayed Protective Responses: inadequate Postural Limitations: inadequate  Balance Balance Balance Assessed: Yes Static Sitting Balance Static Sitting - Balance Support: Feet supported;Bilateral upper extremity supported Static Sitting - Level of Assistance: 6: Modified independent (Device/Increase time) Dynamic Sitting Balance Dynamic Sitting - Balance Support: During functional activity Dynamic Sitting - Level of Assistance: 5: Stand by assistance Dynamic Sitting - Balance Activities: Reaching for objects Static Standing Balance Static Standing - Balance Support: During functional activity Static Standing - Level of Assistance: 4: Min assist Dynamic Standing Balance Dynamic Standing - Level of  Assistance: 4: Min assist Dynamic Standing - Balance Activities: Reaching for objects Extremity/Trunk Assessment RUE Assessment RUE Assessment: Within Functional Limits Active Range of Motion (AROM) Comments: WFL General Strength Comments: 4-/5 general weakness LUE Assessment LUE Assessment: Within Functional Limits Active Range of Motion (AROM) Comments: WFL General Strength Comments: 4-/5 general weakness  Care Tool Care Tool Self Care Eating   Eating Assist Level: Set up assist    Oral Care    Oral Care Assist Level: Set up assist    Bathing         Assist Level: Minimal Assistance - Patient > 75%    Upper Body Dressing(including orthotics)   What is the patient wearing?: Dress   Assist Level: Supervision/Verbal cueing    Lower Body Dressing (excluding footwear)   What is the patient wearing?: Incontinence brief Assist for lower body dressing: Maximal Assistance - Patient 25 - 49%    Putting on/Taking off footwear     Assist for footwear: Dependent - Patient 0%       Care Tool Toileting Toileting activity   Assist for toileting: Moderate Assistance - Patient 50 - 74%     Care Tool Bed Mobility Roll left and right activity   Roll left and right assist level: Maximal Assistance - Patient 25 - 49%    Sit to lying activity   Sit to lying assist level: Maximal Assistance - Patient 25 - 49%    Lying to sitting on side of bed activity   Lying to sitting on side of bed assist level: the ability to move from lying on the back to sitting on the side of the bed with no back support.: Maximal Assistance - Patient 25 - 49%     Care Tool Transfers Sit to stand transfer   Sit to stand assist level: Maximal Assistance - Patient 25 - 49%    Chair/bed transfer   Chair/bed transfer assist level: Minimal Assistance - Patient > 75%     Toilet transfer   Assist Level: Moderate Assistance - Patient 50 - 74%     Care Tool Cognition  Expression of Ideas and Wants  Expression of Ideas and Wants: 4. Without difficulty (complex and basic) - expresses complex messages without difficulty and with speech that is clear and easy to understand  Understanding Verbal and Non-Verbal Content Understanding Verbal and Non-Verbal Content: 4. Understands (complex and basic) - clear comprehension without cues or repetitions   Memory/Recall Ability Memory/Recall Ability : Current season;Staff names and faces;That he or she is in a hospital/hospital unit   Refer to Care Plan for Long Term Goals  SHORT TERM GOAL WEEK 1 OT Short Term Goal 1 (Week 1): STG=LTG d/t ELOS  Recommendations for other services: Therapeutic Recreation  Pet therapy   Skilled Therapeutic Intervention ADL ADL Grooming: Setup Where Assessed-Grooming: Sitting at sink Upper Body Bathing: Supervision/safety Where Assessed-Upper Body  Bathing: Public affairs consultant Bathing: Minimal assistance Where Assessed-Lower Body Bathing: Shower Upper Body Dressing: Supervision/safety Where Assessed-Upper Body Dressing: Wheelchair Lower Body Dressing: Moderate assistance Where Assessed-Lower Body Dressing: Wheelchair Toileting: Maximal assistance;Moderate assistance Where Assessed-Toileting: Teacher, adult education: Moderate assistance (from standard toilet) Toilet Transfer Method: Proofreader: Engineer, technical sales: Not assessed Tub/Shower Equipment: Walk in Electrical engineer Transfer: Insurance underwriter Method: Designer, industrial/product: Transfer tub bench;Grab bars Mobility  Bed Mobility Bed Mobility: Not assessed (already OOB) Transfers Sit to Stand: Minimal Assistance - Patient > 75%;Moderate Assistance - Patient 50-74% Stand to Sit: Minimal Assistance - Patient > 75%;Moderate Assistance - Patient 50-74%  1:1 evaluation and treatment session initiated this date. OT roles, goals and purpose discussed with pt as well as therapy  schedule. ADL completed this date with levels of assist listed above. Pt on toilet upon arrival with NT with O2 at 2L. Pt requiring rest breaks throughout ADL tasks d/t decreased endurance. Pt would benefit from skilled OT in IPR setting in order to maximize independence with ADLs upon D/C.    Discharge Criteria: Patient will be discharged from OT if patient refuses treatment 3 consecutive times without medical reason, if treatment goals not met, if there is a change in medical status, if patient makes no progress towards goals or if patient is discharged from hospital.  The above assessment, treatment plan, treatment alternatives and goals were discussed and mutually agreed upon: by patient and by family  Camie Hoe, OTD, OTR/L 09/08/2023, 12:37 PM

## 2023-09-08 NOTE — Progress Notes (Signed)
 PROGRESS NOTE   Subjective/Complaints: Pt reports O2 is new since in hospital.denies SOB- said her breathing sounds are normal lately.    Not hungry- never eats before 8-9 am at home and won't here since not hungry.  LBM early this AM and last night. Feels like she needs to go again.   Per nurse was incontinent last night, but not this AM.     ROS: Per HPI Pt denies SOB, abd pain, CP, N/V/C/D, and vision changes  Objective:   No results found. Recent Labs    09/08/23 0600  WBC 6.6  HGB 10.7*  HCT 35.7*  PLT 330   Recent Labs    09/08/23 0600  NA 143  K 4.5  CL 96*  CO2 40*  GLUCOSE 98  BUN 11  CREATININE 0.58  CALCIUM  9.4    Intake/Output Summary (Last 24 hours) at 09/08/2023 1949 Last data filed at 09/08/2023 1800 Gross per 24 hour  Intake 598 ml  Output --  Net 598 ml        Physical Exam: Vital Signs Blood pressure (!) 118/42, pulse 76, temperature 98.6 F (37 C), temperature source Oral, resp. rate 16, height 5' 3 (1.6 m), weight 127.4 kg, SpO2 99%.   General: awake, alert, appropriate, sitting up in bed;  NAD HENT: conjugate gaze; oropharynx moist CV: regular rate and rhythm; no JVD Pulmonary: wheezing audibly and worse with auscultation - Has 2 L O2 by [place] GI: soft, NT, ND, (+)BS- protuberant-  Psychiatric: appropriate, extremely flat- staring at me throughout discussion Neurological: alert- follows simple commands for me this AM Neurological:     Mental Status: She is alert.     Comments: Patient was alert sitting up in bed.  Makes eye contact with examiner.  She does answer basic questions as name age and place.  She does follow simple commands        Assessment/Plan: 1. Functional deficits which require 3+ hours per day of interdisciplinary therapy in a comprehensive inpatient rehab setting. Physiatrist is providing close team supervision and 24 hour management of active medical  problems listed below. Physiatrist and rehab team continue to assess barriers to discharge/monitor patient progress toward functional and medical goals  Care Tool:  Bathing              Bathing assist Assist Level: Minimal Assistance - Patient > 75%     Upper Body Dressing/Undressing Upper body dressing   What is the patient wearing?: Dress    Upper body assist Assist Level: Supervision/Verbal cueing    Lower Body Dressing/Undressing Lower body dressing      What is the patient wearing?: Incontinence brief     Lower body assist Assist for lower body dressing: Maximal Assistance - Patient 25 - 49%     Toileting Toileting    Toileting assist Assist for toileting: Moderate Assistance - Patient 50 - 74%     Transfers Chair/bed transfer  Transfers assist  Chair/bed transfer activity did not occur: Safety/medical concerns  Chair/bed transfer assist level: Minimal Assistance - Patient > 75%     Locomotion Ambulation   Ambulation assist      Assist level: Contact  Guard/Touching assist Assistive device: Walker-rolling Max distance: 79'   Walk 10 feet activity   Assist     Assist level: Contact Guard/Touching assist Assistive device: Walker-rolling   Walk 50 feet activity   Assist Walk 50 feet with 2 turns activity did not occur: Safety/medical concerns (too weak, unsafe)         Walk 150 feet activity   Assist Walk 150 feet activity did not occur: Safety/medical concerns (too weak, unsafe)         Walk 10 feet on uneven surface  activity   Assist Walk 10 feet on uneven surfaces activity did not occur: Safety/medical concerns (too weak, unsafe)         Wheelchair     Assist Is the patient using a wheelchair?: Yes Type of Wheelchair: Manual    Wheelchair assist level: Total Assistance - Patient < 25% Max wheelchair distance: >215'    Wheelchair 50 feet with 2 turns activity    Assist        Assist Level: Total  Assistance - Patient < 25%   Wheelchair 150 feet activity     Assist      Assist Level: Total Assistance - Patient < 25%   Blood pressure (!) 118/42, pulse 76, temperature 98.6 F (37 C), temperature source Oral, resp. rate 16, height 5' 3 (1.6 m), weight 127.4 kg, SpO2 99%.  Medical Problem List and Plan: 1. Functional deficits secondary to debility secondary to exacerbation of myasthenia gravis.  Completed 5-day course of IVIG.  Continue Mestinon  30 mg 5 times daily as well as CellCept              -patient may shower             -ELOS/Goals: 7-10 days, supervision PT, sup-min OT, supervision SLP  First day of evaluations- PT< OT and SLP- Con't CIR   2.  Antithrombotics: -DVT/anticoagulation:  Mechanical: Antiembolism stockings, thigh (TED hose) Bilateral lower extremities.  Venous Doppler studies negative             -antiplatelet therapy: N/A 3. 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 continue Voltaren  gel 4 times daily -prn tylenol   -might benefit from brace with activity 8/20- pt asking to increase due to neuropathy, but explained we just increased Lyrica  4. Mood/Behavior/Sleep: Provide emotional support             -antipsychotic agents: Abilify  2 mg daily 5. Neuropsych/cognition: This patient is capable of making decisions on her own behalf. 6. Skin/Wound Care: Routine skin checks 7. Fluids/Electrolytes/Nutrition: Routine in and outs with follow-up chemistries 8.  Acute on chronic respiratory failure with hypoxia and hypercarbia.  Extubated 8/10.  Continue BiPAP.  Continue nebulizers as directed.   -Patient on 2 L O2 nasal cannula prior to admission -keep HOB at 30 degrees 8/20- Pt's CO2 has been high - last checked 8 days prior on BMP- her CO2 is 40 today- 2 points above last check- will recheck in AM- probably need to reduce O2 since CO2  trapping and might need Pulmonary consult/ABG if no better in AM- will write to keep O2 sats at ~ 93-96% and see if can reduce O2 usage. Reached out to nursing.  9.  Community-acquired pneumonia.  Antibiotic course completed.  Check oxygen  saturations every shift 11.  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  Dietary follow-up 12.  Hypertension.  Cozaar  50 mg daily.  Monitor with increased mobility 13.  Hypothyroidism.  Synthroid  14.  Splenic laceration/hematoma.  Recommendations of supportive care per general surgery 15.  Normocytic anemia.  Follow-up CBC   8/20- Hb 10.7- stable- will con't to monitor   I spent a total of 50   minutes on total care today- >50% coordination of care- due to  Review of chart,  labs, vitals- and multiple d/w nursing about Co2 levels   LOS: 1 days A FACE TO FACE EVALUATION WAS PERFORMED  Jorden Minchey 09/08/2023, 7:49 PM

## 2023-09-08 NOTE — Progress Notes (Signed)
 Inpatient Rehabilitation  Patient information reviewed and entered into eRehab system by [facility]. Karen Kays., CCC/SLP, PPS Coordinator.  Information including medical coding, functional ability and quality indicators will be reviewed and updated through discharge.

## 2023-09-08 NOTE — Progress Notes (Signed)
 Inpatient Rehabilitation Center Individual Statement of Services  Patient Name:  [name]  Date:  09/08/2023  Welcome to the Inpatient Rehabilitation Center.  Our goal is to provide you with an individualized program based on your diagnosis and situation, designed to meet your specific needs.  With this comprehensive rehabilitation program, you will be expected to participate in at least 3 hours of rehabilitation therapies Monday-Friday, with modified therapy programming on the weekends.  Your rehabilitation program will include the following services:  Physical Therapy (PT), Occupational Therapy (OT), 24 hour per day rehabilitation nursing, Therapeutic Recreaction (TR), Neuropsychology, Care Coordinator, Rehabilitation Medicine, Nutrition Services, and Pharmacy Services  Weekly team conferences will be held on Tuesday to discuss your progress.  Your Inpatient Rehabilitation Care Coordinator will talk with you frequently to get your input and to update you on team discussions.  Team conferences with you and your family in attendance may also be held.  Expected length of stay: 7-10 days  Overall anticipated outcome: Independent with device  Depending on your progress and recovery, your program may change. Your Inpatient Rehabilitation Care Coordinator will coordinate services and will keep you informed of any changes. Your Inpatient Rehabilitation Care Coordinator's name and contact numbers are listed  below.  The following services may also be recommended but are not provided by the Inpatient Rehabilitation Center:   Home Health Rehabiltiation Services Outpatient Rehabilitation Services    Arrangements will be made to provide these services after discharge if needed.  Arrangements include referral to agencies that provide these services.  Your insurance has been verified to be:  [employer] Your primary doctor is:  Rosina Mcgill  Pertinent information will be shared with your  doctor and your insurance company.  Inpatient Rehabilitation Care Coordinator:  Rhoda Clement, KEN [phone] or ELIGAH BASQUES  Information discussed with and copy given to patient by: Clement Asberry MATSU, 09/08/2023, 11:40 AM

## 2023-09-08 NOTE — Progress Notes (Addendum)
 Physical Therapy Assessment and Plan  Patient Details  Name: ABBAGAIL Blankenship MRN: [id number] Date of Birth: 08-07-1965  PT Diagnosis: Abnormal posture, Abnormality of gait, Difficulty walking, Impaired sensation, Muscle weakness, Osteoarthritis, Pain in joint, and Pain in right knee Rehab Potential: Good ELOS: 7-10 days   Today's Date: 09/08/2023 PT Individual Time: 830-945 PT Individual Time Calculation (min): 75 min   PT Individual Time: 1325-1450 PT Individual Time Calculation (min): 65 min    Hospital Problem: Principal Problem:   Myasthenia gravis (HCC)   Past Medical History:  Past Medical History:  Diagnosis Date   Asthma    Depression    Heart murmur    Myasthenia gravis (HCC)    Obesity    Pulmonary embolism (HCC)    Sleep apnea    Thyroid  disease    Past Surgical History:  Past Surgical History:  Procedure Laterality Date   BREAST SURGERY  06/2019   breast reduction   THYROID  SURGERY     TONSILLECTOMY     TUBAL LIGATION      Assessment & Plan Clinical Impression: Patient is a 58 y.o. female with a history of Myasthenia Gravis, (1990), moderate persistent asthma, chronic respite failure with hypoxia and hypercarbia on 2 L/min of oxygen , pulmonary embolism, hypertension, hypothyroidism, depression, morbid obesity, OSA on CPAP. Pt fatigues easily with laboured breathing, productive coughing at times. Pt is motivated and pleasant.  Patient transferred to CIR on 09/07/2023 .   Patient currently requires max with mobility secondary to muscle weakness, decreased cardiorespiratoy endurance and decreased oxygen  support, unbalanced muscle activation, and decreased standing balance, decreased postural control, and decreased balance strategies.  Prior to hospitalization, patient was independent  with mobility and lived with Family in a House home.  Home access is 5Stairs to enter.  Patient will benefit from skilled PT intervention to maximize safe functional mobility,  minimize fall risk, and decrease caregiver burden for planned discharge home with 24 hour supervision.  Anticipate patient will benefit from follow up HH at discharge.  PT - End of Session Activity Tolerance: Decreased this session;Tolerates 10 - 20 min activity with multiple rests Endurance Deficit: Yes Endurance Deficit Description: increased laboured breathing, productive coughing PT Assessment Rehab Potential (ACUTE/IP ONLY): Good PT Patient demonstrates impairments in the following area(s): Balance;Endurance;Motor;Pain;Safety;Sensory PT Transfers Functional Problem(s): Bed Mobility;Bed to Chair;Car;Furniture PT Locomotion Functional Problem(s): Ambulation;Wheelchair Mobility;Stairs PT Plan: HH PT  PT Intensity: Minimum of 1-2 x/day ,45 to 90 minutes PT Frequency: 5 out of 7 days PT Duration Estimated Length of Stay: 7-10 days PT Treatment/Interventions: Ambulation/gait training;Community reintegration;DME/adaptive equipment instruction;Neuromuscular re-education;Stair training;Psychosocial support;UE/LE Strength taining/ROM;Wheelchair propulsion/positioning;UE/LE Coordination activities;Therapeutic Activities;Pain management;Discharge planning;Balance/vestibular training;Cognitive remediation/compensation;Disease management/prevention;Functional mobility training;Patient/family education;Therapeutic Exercise PT Transfers Anticipated Outcome(s): mod indep PT Locomotion Anticipated Outcome(s): mod indep with LRAD PT Recommendation Recommendations for Other Services: Therapeutic Recreation consult Therapeutic Recreation Interventions: Stress management;Outing/community reintergration (pt is afraid of dogs) Patient destination: Home Equipment Recommended: To be determined   PT Evaluation Precautions/Restrictions Precautions Precautions: Fall Recall of Precautions/Restrictions: Intact Precaution/Restrictions Comments: oxygen  2L Restrictions Weight Bearing Restrictions Per Provider  Order: No  Pain 7/10 right knee, constant throbbing   Pain Interference Pain Interference Pain Effect on Sleep: 1. Rarely or not at all Pain Interference with Therapy Activities: 1. Rarely or not at all Pain Interference with Day-to-Day Activities: 1. Rarely or not at all Home Living/Prior Functioning Home Living Living Arrangements: Other relatives Type of Home: House Home Access: Stairs to enter Entrance Stairs-Number of Steps: 5 Entrance Stairs-Rails: Right;Left  Home Layout: One level  Lives With: Family Prior Function Level of Independence: Independent with basic ADLs  Able to Take Stairs?: Reciprically Driving: No Vocation: Retired Leisure: Hobbies-no Vision/Perception  Vision - History Ability to See in Adequate Light: 0 Adequate Vision - Assessment Additional Comments: ptsosis, dysconjugate left eye Perception Perception: Within Functional Limits Praxis Praxis: WFL  Cognition Overall Cognitive Status: Impaired/Different from baseline Arousal/Alertness: Awake/alert Orientation Level: Oriented X4 Memory: Impaired Memory Impairment: Decreased short term memory Decreased Short Term Memory: Functional basic;Verbal complex Awareness: Appears intact Problem Solving: Appears intact Safety/Judgment: Appears intact Sensation Sensation Light Touch: Impaired Detail Peripheral sensation comments: 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, hypersensitive Light Touch Impaired Details: Impaired RLE;Impaired LLE Hot/Cold: Appears Intact Proprioception: Appears Intact Stereognosis: Not tested Coordination Gross Motor Movements are Fluid and Coordinated: No Fine Motor Movements are Fluid and Coordinated: No Coordination and Movement Description: d/t debility and general weakness Motor  Motor Motor: Abnormal postural alignment and control   Trunk/Postural Assessment  Cervical Assessment Cervical Assessment: Exceptions to [facility]) Thoracic Assessment Thoracic Assessment:  Exceptions to [facility] (kyphotic) Lumbar Assessment Lumbar Assessment: Exceptions to WFL (decreased lordosis) Postural Control Postural Control: Deficits on evaluation Righting Reactions: delayed Protective Responses: inadequate Postural Limitations: inadequate  Balance Balance Balance Assessed: Yes Static Sitting Balance Static Sitting - Balance Support: Feet supported;Bilateral upper extremity supported Static Sitting - Level of Assistance: 6: Modified independent (Device/Increase time) Dynamic Sitting Balance Dynamic Sitting - Balance Support: During functional activity Dynamic Sitting - Level of Assistance: 5: Stand by assistance Dynamic Sitting - Balance Activities: Forward lean/weight shifting Static Standing Balance Static Standing - Balance Support: During functional activity Static Standing - Level of Assistance: 4: Min assist Dynamic Standing Balance Dynamic Standing - Balance Support: Bilateral upper extremity supported;During functional activity Dynamic Standing - Level of Assistance: 4: Min assist Dynamic Standing - Balance Activities: Forward lean/weight shifting;Reaching for objects Extremity Assessment      RLE Assessment RLE Assessment: Within Functional Limits General Strength Comments: grossly 4+/5, exception DF 4/5 LLE Assessment LLE Assessment: Within Functional Limits General Strength Comments: grossly 4+/5  Care Tool Care Tool Bed Mobility Roll left and right activity   Roll left and right assist level: Maximal Assistance - Patient 25 - 49%    Sit to lying activity   Sit to lying assist level: Maximal Assistance - Patient 25 - 49%    Lying to sitting on side of bed activity   Lying to sitting on side of bed assist level: the ability to move from lying on the back to sitting on the side of the bed with no back support.: Maximal Assistance - Patient 25 - 49%     Care Tool Transfers Sit to stand transfer   Sit to stand assist level: Maximal Assistance -  Patient 25 - 49%    Chair/bed transfer   Chair/bed transfer assist level: Minimal Assistance - Patient > 75%    Car transfer Car transfer activity did not occur: Safety/medical concerns (too weak, unsafe)        Care Tool Locomotion Ambulation   Assist level: Contact Guard/Touching assist Assistive device: Walker-rolling Max distance: 43'  Walk 10 feet activity   Assist level: Contact Guard/Touching assist Assistive device: Walker-rolling   Walk 50 feet with 2 turns activity Walk 50 feet with 2 turns activity did not occur: Safety/medical concerns (too weak, unsafe)      Walk 150 feet activity Walk 150 feet activity did not occur: Safety/medical concerns (too weak, unsafe)  Walk 10 feet on uneven surfaces activity Walk 10 feet on uneven surfaces activity did not occur: Safety/medical concerns (too weak, unsafe)      Stairs Stair activity did not occur: Safety/medical concerns (too weak, unsafe)        Walk up/down 1 step activity Walk up/down 1 step or curb (drop down) activity did not occur: Safety/medical concerns (too weak, unsafe)      Walk up/down 4 steps activity Walk up/down 4 steps activity did not occur: Safety/medical concerns (too weak, unsafe)      Walk up/down 12 steps activity Walk up/down 12 steps activity did not occur: Safety/medical concerns (too weak, unsafe)      Pick up small objects from floor Pick up small object from the floor (from standing position) activity did not occur: Safety/medical concerns (too weak, unsafe)      Wheelchair Is the patient using a wheelchair?: Yes Type of Wheelchair: Manual   Wheelchair assist level: Total Assistance - Patient < 25% Max wheelchair distance: >215'  Wheel 50 feet with 2 turns activity   Assist Level: Total Assistance - Patient < 25%  Wheel 150 feet activity   Assist Level: Total Assistance - Patient < 25%    Refer to Care Plan for Long Term Goals  SHORT TERM GOAL WEEK 1 PT Short Term Goal 1  (Week 1): = LTG  Recommendations for other services: Therapeutic Recreation  Stress management and Outing/community reintegration  Skilled Therapeutic Intervention Mobility Bed Mobility Bed Mobility: Rolling Right;Rolling Left;Sit to Supine;Supine to Sit Rolling Right: Maximal Assistance - Patient 25-49% Rolling Left: Maximal Assistance - Patient 25-49% Supine to Sit: Maximal Assistance - Patient - Patient 25-49% Sit to Supine: Maximal Assistance - Patient 25-49% Transfers Transfers: Sit to Stand;Stand to Sit;Stand Pivot Transfers Sit to Stand: Minimal Assistance - Patient > 75%;Moderate Assistance - Patient 50-74% Stand to Sit: Minimal Assistance - Patient > 75%;Moderate Assistance - Patient 50-74% Stand Pivot Transfers: Contact Guard/Touching assist Transfer (Assistive device): Rolling walker Locomotion  Gait Ambulation: Yes Gait Assistance: Contact Guard/Touching assist Gait Distance (Feet): 43 Feet Assistive device: Rolling walker Gait Gait: Yes Gait Pattern: Decreased stride length;Shuffle;Wide base of support;Trunk flexed Gait velocity: variable but overall decreased Stairs / Additional Locomotion Stairs: No (too weak, unsafe, fearful) Wheelchair Mobility Wheelchair Mobility: No   Evaluation completed (see details above and below) with education on PT POC and goals and individual treatment initiated with focus on endurance, gait with RW, safe transfers, stair negotiation. Pt ambulated with RW x43' for functional strengthening/endurance training, PT pulling w/c and oxygen  tank. Pt demonstrates shuffled, short stride gait pattern with flexed posture, pt able to transfer bed post tx with max assist for trunk/LE assist.  Noted SOB, pt's SPO2 monitored during activity intermittently and stayed 96%. Pt fatigued post tx, requires instruction for diaphragmatic breathing techniques.  Session 2: Pt up in w/c when PT arrived, pt agreeable for session. Pt requesting to use bathroom, pt  able to transfer on/off toilet with CGA for doffing/donning briefs while standing with RW.  Pt wheeled to ortho gym to conserve energy. Pt performed UBE x5' with intermittent rest breaks as needed.  Pt navigated ramp with RW, CGA without rest, however, pt vocalizing exhaustion. Pt wheeled back to room and willing to stay up in w/c, all items within reach. Pt states she was impressed with herself today.   Discharge Criteria: Patient will be discharged from PT if patient refuses treatment 3 consecutive times without medical reason, if treatment goals not  met, if there is a change in medical status, if patient makes no progress towards goals or if patient is discharged from hospital.  The above assessment, treatment plan, treatment alternatives and goals were discussed and mutually agreed upon: by patient  [name] Fast 09/08/2023, 3:20 PM

## 2023-09-08 NOTE — Plan of Care (Signed)
  Problem: RH Balance Goal: LTG Patient will maintain dynamic standing with ADLs (OT) Description: LTG:  Patient will maintain dynamic standing balance with assist during activities of daily living (OT)  Flowsheets (Taken 09/08/2023 1235) LTG: Pt will maintain dynamic standing balance during ADLs with: Independent with assistive device   Problem: RH Grooming Goal: LTG Patient will perform grooming w/assist,cues/equip (OT) Description: LTG: Patient will perform grooming with assist, with/without cues using equipment (OT) Flowsheets (Taken 09/08/2023 1235) LTG: Pt will perform grooming with assistance level of: Independent with assistive device    Problem: RH Bathing Goal: LTG Patient will bathe all body parts with assist levels (OT) Description: LTG: Patient will bathe all body parts with assist levels (OT) Flowsheets (Taken 09/08/2023 1235) LTG: Pt will perform bathing with assistance level/cueing: Independent with assistive device    Problem: RH Dressing Goal: LTG Patient will perform upper body dressing (OT) Description: LTG Patient will perform upper body dressing with assist, with/without cues (OT). Flowsheets (Taken 09/08/2023 1235) LTG: Pt will perform upper body dressing with assistance level of: Independent with assistive device Goal: LTG Patient will perform lower body dressing w/assist (OT) Description: LTG: Patient will perform lower body dressing with assist, with/without cues in positioning using equipment (OT) Flowsheets (Taken 09/08/2023 1235) LTG: Pt will perform lower body dressing with assistance level of: Independent with assistive device   Problem: RH Toileting Goal: LTG Patient will perform toileting task (3/3 steps) with assistance level (OT) Description: LTG: Patient will perform toileting task (3/3 steps) with assistance level (OT)  Flowsheets (Taken 09/08/2023 1235) LTG: Pt will perform toileting task (3/3 steps) with assistance level: Independent with assistive  device   Problem: RH Toilet Transfers Goal: LTG Patient will perform toilet transfers w/assist (OT) Description: LTG: Patient will perform toilet transfers with assist, with/without cues using equipment (OT) Flowsheets (Taken 09/08/2023 1235) LTG: Pt will perform toilet transfers with assistance level of: Independent with assistive device   Problem: RH Tub/Shower Transfers Goal: LTG Patient will perform tub/shower transfers w/assist (OT) Description: LTG: Patient will perform tub/shower transfers with assist, with/without cues using equipment (OT) Flowsheets (Taken 09/08/2023 1235) LTG: Pt will perform tub/shower stall transfers with assistance level of: Independent with assistive device

## 2023-09-09 DIAGNOSIS — G7000 Myasthenia gravis without (acute) exacerbation: Secondary | ICD-10-CM | POA: Diagnosis not present

## 2023-09-09 LAB — BASIC METABOLIC PANEL WITH GFR
Anion gap: 12 (ref 5–15)
BUN: 9 mg/dL (ref 6–20)
CO2: 39 mmol/L — ABNORMAL HIGH (ref 22–32)
Calcium: 9.5 mg/dL (ref 8.9–10.3)
Chloride: 92 mmol/L — ABNORMAL LOW (ref 98–111)
Creatinine, Ser: 0.61 mg/dL (ref 0.44–1.00)
GFR, Estimated: >60 mL/min (ref >60)
Glucose, Bld: 113 mg/dL — ABNORMAL HIGH (ref 70–99)
Potassium: 4.1 mmol/L (ref 3.5–5.1)
Sodium: 143 mmol/L (ref 135–145)

## 2023-09-09 MED ORDER — ENOXAPARIN SODIUM 40 MG/0.4ML IJ SOSY: 40.0000 mg | PREFILLED_SYRINGE | INTRAMUSCULAR | Status: DC

## 2023-09-09 MED ORDER — ENOXAPARIN SODIUM 60 MG/0.6ML IJ SOSY
60.0000 mg | PREFILLED_SYRINGE | INTRAMUSCULAR | Status: DC
  Administered 2023-09-09 – 2023-09-22 (×14): 60 mg via SUBCUTANEOUS
  Filled 2023-09-09 (×14): qty 0.6

## 2023-09-09 MED ORDER — CARBAMIDE PEROXIDE 6.5 % OT SOLN
5.0000 [drp] | Freq: Two times a day (BID) | OTIC | Status: AC
  Administered 2023-09-09 – 2023-09-12 (×8): 5 [drp] via OTIC
  Filled 2023-09-09: qty 15

## 2023-09-09 MED ORDER — DM-GUAIFENESIN ER 30-600 MG PO TB12
1.0000 | ORAL_TABLET | Freq: Two times a day (BID) | ORAL | Status: DC
  Administered 2023-09-09 – 2023-09-22 (×26): 1 via ORAL
  Filled 2023-09-09 (×27): qty 1

## 2023-09-09 NOTE — Progress Notes (Signed)
 Physical Therapy Session Note  Patient Details  Name: [name] MRN: [id number] Date of Birth: April 22, 1965  Today's Date: 09/09/2023 PT Individual Time: 1300-1350 PT Individual Time Calculation (min): 50 min   Short Term Goals: Week 1:  PT Short Term Goal 1 (Week 1): = LTG  Skilled Therapeutic Interventions/Progress Updates:    Pt up in w/c when PT arrived, pt agreeable for session.  SPO2 prior to session 99% on 2L/min via [place]. Pt reports right knee pain 5/10.  Wheeled to gym to conserve gym.  Pt performed stair negotiation x2 rail with x2 helper for assist for safety/balance, push up with RLE.  PT instructed pt on proper sequencing ascending/descending. Pt took total of 12 steps, required extended rest post activity.  SPO2 98% on 2L/min via [place].   Pt willing to ambulate back to room.  Pt amb with RW x2 helper for w/c and oxygen  assist, amb x128' with x1 rest break. Noted laboured breathing. Pt unable to continue due to fatigue. Pt wheeled to room, pt up in w/c with all items with reach.     Therapy Documentation Precautions:  Precautions Precautions: Fall Recall of Precautions/Restrictions: Intact Precaution/Restrictions Comments: oxygen  2L Restrictions Weight Bearing Restrictions Per Provider Order: No General: PT Amount of Missed Time (min): 10 Minutes PT Missed Treatment Reason: Patient fatigue  Pain: Pain Assessment Pain Scale: 5 Pain Score: 0-No pain    Therapy/Group: Individual Therapy  [name] Fast 09/09/2023, 2:06 PM

## 2023-09-09 NOTE — IPOC Note (Addendum)
 Overall Plan of Care [facility]) Patient Details Name: [name] MRN: [id number] DOB: 02/18/1965  Admitting Diagnosis: Myasthenia gravis [facility])  Hospital Problems: Principal Problem:   Myasthenia gravis (HCC)     Functional Problem List: Nursing Safety, Bowel, Endurance, Medication Management, Pain, Nutrition  PT Balance, Endurance, Motor, Pain, Safety, Sensory  OT Balance  SLP    TR         Basic ADL's: OT Grooming, Bathing, Dressing, Toileting     Advanced  ADL's: OT None     Transfers: PT Bed Mobility, Bed to Chair, Car, Occupational psychologist, Research scientist (life sciences): PT Ambulation, Psychologist, prison and probation services, Stairs     Additional Impairments: OT None  SLP        TR      Anticipated Outcomes Item Anticipated Outcome  Self Feeding mod I  Swallowing      Basic self-care  mod I  Toileting  mod I   Bathroom Transfers mod I  Bowel/Bladder  manage bowel w mod I assist  Transfers  mod indep  Locomotion  mod indep with LRAD  Communication     Cognition     Pain  Pain <  with prns  Safety/Judgment  manage safety w cues   Therapy Plan: PT Intensity: Minimum of 1-2 x/day ,45 to 90 minutes PT Frequency: 5 out of 7 days PT Duration Estimated Length of Stay: 7-10 days OT Intensity: Minimum of 1-2 x/day, 45 to 90 minutes OT Frequency: 5 out of 7 days OT Duration/Estimated Length of Stay: 7-10 days     Team Interventions: Nursing Interventions Patient/Family Education, Medication Management, Bowel Management, Disease Management/Prevention, Pain Management, Discharge Planning  PT interventions Ambulation/gait training, Community reintegration, DME/adaptive equipment instruction, Neuromuscular re-education, Museum/gallery curator, Psychosocial support, UE/LE Strength taining/ROM, Wheelchair propulsion/positioning, UE/LE Coordination activities, Therapeutic Activities, Pain management, Discharge planning, Warden/ranger, Cognitive  remediation/compensation, Disease management/prevention, Functional mobility training, Patient/family education, Therapeutic Exercise  OT Interventions Balance/vestibular training, Discharge planning, Functional electrical stimulation, Pain management, Self Care/advanced ADL retraining, Therapeutic Activities, UE/LE Coordination activities, Cognitive remediation/compensation, Disease mangement/prevention, Functional mobility training, Patient/family education, Skin care/wound managment, Therapeutic Exercise, Community reintegration, DME/adaptive equipment instruction, Neuromuscular re-education, Psychosocial support, UE/LE Strength taining/ROM  SLP Interventions    TR Interventions    SW/CM Interventions Discharge Planning, Psychosocial Support, Patient/Family Education   Barriers to Discharge MD  Medical stability, Home enviroment access/loayout, Lack of/limited family support, and Weight  Nursing Decreased caregiver support, Home environment access/layout 1 level 5-7 ste bil rails with sister; Indepenent in home without AD, sisters assist with some adls in past few weeks. Sponge bathes at baseline. Painful feet since august 2024  PT  none    OT None    SLP      SW Insurance for SNF coverage     Team Discharge Planning: Destination: PT-Home ,OT- Home , SLP-  Projected Follow-up: PT-Home health PT, OT-  Home health OT, SLP-  Projected Equipment Needs: PT-To be determined, OT- Tub/shower bench, To be determined, 3 in 1 bedside comode, SLP-  Equipment Details: PT- , OT-  Patient/family involved in discharge planning: PT- Patient,  OT-Family member/caregiver, Patient, SLP-   MD ELOS: 7-10 days Medical Rehab Prognosis:  Good Assessment: The patient has been admitted for CIR therapies with the diagnosis of myasthenia gravis exacerbation with Pneumonia. The team will be addressing functional mobility, strength, stamina, balance, safety, adaptive techniques and equipment, self-care, bowel and  bladder mgt, patient and caregiver education, . Goals have been  set at mod I. Anticipated discharge destination is home.        See Team Conference Notes for weekly updates to the plan of care

## 2023-09-09 NOTE — Progress Notes (Signed)
 Occupational Therapy Session Note  Patient Details  Name: [name] MRN: [id number] Date of Birth: [birth year]  Today's Date: 09/09/2023 OT Individual Time: 1000-1100 OT Individual Time Calculation (min): 60 min    Short Term Goals: Week 1:  OT Short Term Goal 1 (Week 1): STG=LTG d/t ELOS  Skilled Therapeutic Interventions/Progress Updates:      Therapy Documentation Precautions:  Precautions Precautions: Fall Recall of Precautions/Restrictions: Intact Precaution/Restrictions Comments: oxygen  2L Restrictions Weight Bearing Restrictions Per Provider Order: No General: Pt seated in W/C upon OT arrival, agreeable to OT. Nsg present giving pt ear drops.  Pain: 5/10 pain reported in Rt knee, activity, intermittent rest breaks, distractions provided for pain management, pt reports tolerable to proceed.   Exercises: Pt completed 10 minutes of arm bike, 5 min forward, 5 min backward in order to increase BUE/BLEstrength and endurance in preparation for increased independence in ADLs such as functional mobility. Rest break after 5 min, on level 1 resistance in order to target endurance.  Pt completed the following exercise circuit in order to improve functional activity, strength and endurance to prepare for ADLs such as bathing. Pt completed the following exercises in seated position with no noted LOB/SOB and 3x10 repetitions on each exercise: -bicep curls with 4# dowel rod -triceps extensions with yellow theraband -shoulder abduction with yellow theraband -forward punches with yellow theraband -shoulder flexion with 1# dowel rod   Other Treatments: OT and pt discussing layout of house from bedroom to bathroom. Pt reporting only about 10 ft.    Pt seated in W/C at end of session with W/C alarm donned, call light within reach and 4Ps assessed.    Therapy/Group: Individual Therapy  Camie Hoe, OTD, OTR/L 09/09/2023, 12:58 PM

## 2023-09-09 NOTE — Plan of Care (Signed)
  Problem: Consults Goal: RH GENERAL PATIENT EDUCATION Description: See Patient Education module for education specifics. Outcome: Progressing   Problem: RH BOWEL ELIMINATION Goal: RH STG MANAGE BOWEL WITH ASSISTANCE Description: STG Manage Bowel with mod I Assistance. Outcome: Progressing Goal: RH STG MANAGE BOWEL W/MEDICATION W/ASSISTANCE Description: STG Manage Bowel with Medication with mod I  Assistance. Outcome: Progressing   Problem: RH SAFETY Goal: RH STG ADHERE TO SAFETY PRECAUTIONS W/ASSISTANCE/DEVICE Description: STG Adhere to Safety Precautions With cues Assistance/Device. Outcome: Progressing   Problem: RH PAIN MANAGEMENT Goal: RH STG PAIN MANAGED AT OR BELOW PT'S PAIN GOAL Description: Pain < 4 with prns Outcome: Progressing   Problem: RH KNOWLEDGE DEFICIT GENERAL Goal: RH STG INCREASE KNOWLEDGE OF SELF CARE AFTER HOSPITALIZATION Description: Patient and sister will be able to manage care at discharge using educational resources for medications and dietary modification independently Outcome: Progressing   Problem: RH PAIN MANAGEMENT Goal: RH STG PAIN MANAGED AT OR BELOW PT'S PAIN GOAL Outcome: Progressing

## 2023-09-09 NOTE — Progress Notes (Signed)
 PROGRESS NOTE   Subjective/Complaints:  Pt reports breathing better- is on Albuterol  nebs BID- so hasn't really asked for prn.  We decreased her O2 to 1L and sats stayed ~ 92-94% overnight.   No O2 to bathroom per nursing.  Slept well- no pain except R knee.  LBM yesterday.  Using flutter valve 10x/per hour when awake.  Also c/o hearing heart beat in R ear- sounds plugged up from earwax?  Has chronic hypoxic hypercarbia- and is on 2L Of O2 at home.    ROS: Per HPI   Pt denies any SOB, abd pain, CP, N/V/C/D, and vision changes  Objective:   No results found. Recent Labs    09/08/23 0600  WBC 6.6  HGB 10.7*  HCT 35.7*  PLT 330   Recent Labs    09/08/23 0600 09/09/23 0612  NA 143 143  K 4.5 4.1  CL 96* 92*  CO2 40* 39*  GLUCOSE 98 113*  BUN 11 9  CREATININE 0.58 0.61  CALCIUM  9.4 9.5    Intake/Output Summary (Last 24 hours) at 09/09/2023 0832 Last data filed at 09/09/2023 0700 Gross per 24 hour  Intake 716 ml  Output --  Net 716 ml        Physical Exam: Vital Signs Blood pressure (!) 134/50, pulse 83, temperature 98.8 F (37.1 C), temperature source Oral, resp. rate 20, height 5' 3 (1.6 m), weight 127.4 kg, SpO2 93%.   General: awake, alert, appropriate, sitting up in bed; eating; NAD HENT: conjugate gaze; oropharynx a little dry- O2 in 1 nare, not the other CV: regular rate and rhythm; no JVD Pulmonary: Less audible wheezing, but has coarse thick sounding cough- decreased wheezing, but still there on Auscultation  GI: soft, NT, ND, (+)BS- rotund Psychiatric: appropriate- interactive Neurological: Ox3- answering complex questions Neurological:     Mental Status: She is alert.     Comments: Patient was alert sitting up in bed.  Makes eye contact with examiner.  She does answer basic questions as name age and place.  She does follow simple commands        Assessment/Plan: 1. Functional  deficits which require 3+ hours per day of interdisciplinary therapy in a comprehensive inpatient rehab setting. Physiatrist is providing close team supervision and 24 hour management of active medical problems listed below. Physiatrist and rehab team continue to assess barriers to discharge/monitor patient progress toward functional and medical goals  Care Tool:  Bathing              Bathing assist Assist Level: Minimal Assistance - Patient > 75%     Upper Body Dressing/Undressing Upper body dressing   What is the patient wearing?: Dress    Upper body assist Assist Level: Supervision/Verbal cueing    Lower Body Dressing/Undressing Lower body dressing      What is the patient wearing?: Incontinence brief     Lower body assist Assist for lower body dressing: Maximal Assistance - Patient 25 - 49%     Toileting Toileting    Toileting assist Assist for toileting: Moderate Assistance - Patient 50 - 74%     Transfers Chair/bed transfer  Transfers assist  Chair/bed  transfer activity did not occur: Safety/medical concerns  Chair/bed transfer assist level: Minimal Assistance - Patient > 75%     Locomotion Ambulation   Ambulation assist      Assist level: Contact Guard/Touching assist Assistive device: Walker-rolling Max distance: 44'   Walk 10 feet activity   Assist     Assist level: Contact Guard/Touching assist Assistive device: Walker-rolling   Walk 50 feet activity   Assist Walk 50 feet with 2 turns activity did not occur: Safety/medical concerns (too weak, unsafe)         Walk 150 feet activity   Assist Walk 150 feet activity did not occur: Safety/medical concerns (too weak, unsafe)         Walk 10 feet on uneven surface  activity   Assist Walk 10 feet on uneven surfaces activity did not occur: Safety/medical concerns (too weak, unsafe)         Wheelchair     Assist Is the patient using a wheelchair?: Yes Type of  Wheelchair: Manual    Wheelchair assist level: Total Assistance - Patient < 25% Max wheelchair distance: >215'    Wheelchair 50 feet with 2 turns activity    Assist        Assist Level: Total Assistance - Patient < 25%   Wheelchair 150 feet activity     Assist      Assist Level: Total Assistance - Patient < 25%   Blood pressure (!) 134/50, pulse 83, temperature 98.8 F (37.1 C), temperature source Oral, resp. rate 20, height 5' 3 (1.6 m), weight 127.4 kg, SpO2 93%.  Medical Problem List and Plan: 1. Functional deficits secondary to debility secondary to exacerbation of myasthenia gravis.  Completed 5-day course of IVIG.  Continue Mestinon  30 mg 5 times daily as well as CellCept              -patient may shower             -ELOS/Goals: 7-10 days, supervision PT, sup-min OT, supervision SLP  Cont' CIR PT, OT and SLP  IPOC done 2.  Antithrombotics: -DVT/anticoagulation:  Mechanical: Antiembolism stockings, thigh (TED hose) Bilateral lower extremities.  Venous Doppler studies negative 8/21- will start Lovenox  since at high risk for DVT/PE- and on no meds             -antiplatelet therapy: N/A 3. 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 continue Voltaren  gel 4 times daily -prn tylenol   -might benefit from brace with activity 8/20- pt asking to increase due to neuropathy, but explained we just increased Lyrica  4. Mood/Behavior/Sleep: Provide emotional support             -antipsychotic agents: Abilify  2 mg daily 5. Neuropsych/cognition: This patient is capable of making decisions on her own behalf. 6. Skin/Wound Care: Routine skin checks 7. Fluids/Electrolytes/Nutrition: Routine in and outs with follow-up chemistries 8.  Acute on chronic respiratory failure with hypoxia and hypercarbia.  Extubated 8/10.  Continue BiPAP.  Continue nebulizers as  directed.   -Patient on 2 L O2 nasal cannula prior to admission -keep HOB at 30 degrees 8/20- Pt's CO2 has been high - last checked 8 days prior on BMP- her CO2 is 40 today- 2 points above last check- will recheck in AM- probably need to reduce O2 since CO2 trapping and might need Pulmonary consult/ABG if no better in AM- will write  to keep O2 sats at ~ 93-96% and see if can reduce O2 usage. Reached out to nursing.  8/21- d/w PA at length about this- has this chronically- will monitor clinically and make sure no decline in cognition/alertness- if so, will call Pulmonary 9.  Community-acquired pneumonia.  Antibiotic course completed.  Check oxygen  saturations every shift 11.  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  Dietary follow-up 12.  Hypertension.  Cozaar  50 mg daily.  Monitor with increased mobility 13.  Hypothyroidism.  Synthroid  14.  Splenic laceration/hematoma.  Recommendations of supportive care per general surgery 15.  Normocytic anemia.  Follow-up CBC   8/20- Hb 10.7- stable- will con't to monitor 16. Ear wax? Blocking R ear/  8/21- will order Debrox 5 drips BID for R ear x 4 days- see if Sx's improve   I spent a total of 45   minutes on total care today- >50% coordination of care- due to  D/w pt about her Albuterol - reviewed labs again- and review of vitals, B/B- also d/w nursing    LOS: 2 days A FACE TO FACE EVALUATION WAS PERFORMED  [name] 09/09/2023, 8:32 AM

## 2023-09-09 NOTE — Progress Notes (Signed)
 Physical Therapy Session Note  Patient Details  Name: [name] MRN: [id number] Date of Birth: 1965-03-22  Today's Date: 09/09/2023 PT Individual Time: 800-900, 1130-1200 PT Individual Time Calculation (min): 60 min, 30 min   Short Term Goals: Week 1:  PT Short Term Goal 1 (Week 1): = LTG  Skilled Therapeutic Interventions/Progress Updates:   First AM Session:  Pt received supine in bed, agreeable to therapy. Pt reported 7/10 R knee pain, to be addressed with physical movement. Per chart, pt baseline 2L O2 via [place], in room pt on 1.5L. SpO2 checked 87%. O2 adjusted to 2L, SpO2 93+% within .   Pt ambulated ~64ft to restroom w/ portable O2 tank and [place], required increased time, RW and minA for safety/balance. Pt required maxA to doff/don brief and pants. Pt able to perform pericare independently, required minA stand to sit to commode, minA sit to stand from commode w/ RW. SpO2 as low as 82% after toileting and stand step transfer to [facility]. Pt educated on pursed lip breathing (PLB) and cued to perform it. SpO2 increased to 93+% w/in 2 minutes.   Transfers: (w/ 2L O2 via [place]) Sit to stand: Pt performed several sit to stand/stand-sit transfers w/ CGA to minA and RW for stability and safety. Stand step: pt performed stand step transfer to/from WC<>mat table w/ RW, CGA, and increased time and verbal cues.  Notes: After each transfer, pt SpO2 dropped as low as 87%, recovered to 93+% w/in 2 minutes each time.   Interventions: (w/ 2L O2 via [place])  Gait- Pt ambulated 16', 16' (8' forward, 8' backwards) w/ RW to improve functional activity tolerance and cardiovascular endurance. Pt required CGA for forward gait, minA for backwards gait. Pt demonstrated narrow BOS, decreased step length B (significantly worse in backwards gait), requiring verbal cues to temporarily lift back legs of RW in backwards gait to keep RW moving/close. Pt w/ audible breath sounds after each bout of gait. After first bout-  SpO2 87% w/ quick recovery to 93% after cueing PLB. Second bout, pt encouraged to focus on breathing, SpO2 remained at 93%!  Pt reported 5/10 R knee pain at end of session, stating it doesn't hurt when I walk. Pt wheeled back to room, requested to remain upright in [facility] w/ call bell and all other needs in place.   Second AM Session:  Pt received upright in WC, agreeable to therapy. Pt stated R knee pain remains 5/10, will address with physical movement. Pt on 2LO2 via [place], portable O2 tank brought along for therapy session.  Pt wheeled to ortho gym for time. Pt performed stand step transfer to NuStep, requiring RW and minA to get hips back into seat. Pt desatted to 85% after transfer, cued PLB and pt recovered 93+ w/in 1 minute.  Pt performed 9 min on NuStep at lvl 1 to improve cardiovascular endurance and promote/maintain BLE/BUE ROM, cued to focus on PLB throughout w/ goal of >93% SpO2 every 3 minutes. Pt successfully maintained SpO2 >93% throughout NuStep exercise. Pt performed stand step transfer from NuStep to [facility] w/ increased time, using RW and requiring CGA for safety. Pt requires min-modA to move hips further back into WC, more effective when WC legs are on and pt can use BLE to push.  Pt wheeled back to room and remained upright in WC for lunch. Pt given call bell, suction, and all other needs within reach.   Therapy Documentation Precautions:  Precautions Precautions: Fall Recall of Precautions/Restrictions: Intact Precaution/Restrictions Comments:  oxygen  2L Restrictions Weight Bearing Restrictions Per Provider Order: No    Therapy/Group: Individual Therapy  Oneil Grumbles 09/09/2023, 12:20 PM

## 2023-09-10 NOTE — Progress Notes (Signed)
 Met with patient to review current situation, team conference and plan of care. Reviewed medications, safety. Continue to follow along to provide educational needs to facilitate preparation for discharge.

## 2023-09-10 NOTE — Progress Notes (Signed)
 PROGRESS NOTE   Subjective/Complaints:  Pt reports wears her CPAP at night- already took off for the morning.  Wearing 1-2 L to keep sats >92% and below 96%- on 2L at home.  Pt reports it feels like she needs to get something up-Mucinex  hasn't helped yet, taking Alb nebs BID- not really the prn Alb nebs-  We discussed asking for prn nebs today to try and break things up in lungs so can cough out.  Said hasn't had pneumonia before, and didn't have this feeling until had pneumonia.  ROS: Per HPI   Pt denies SOB, abd pain, CP, N/V/C/D, and vision changes   Objective:   No results found. Recent Labs    09/08/23 0600  WBC 6.6  HGB 10.7*  HCT 35.7*  PLT 330   Recent Labs    09/08/23 0600 09/09/23 0612  NA 143 143  K 4.5 4.1  CL 96* 92*  CO2 40* 39*  GLUCOSE 98 113*  BUN 11 9  CREATININE 0.58 0.61  CALCIUM  9.4 9.5    Intake/Output Summary (Last 24 hours) at 09/10/2023 0734 Last data filed at 09/10/2023 0700 Gross per 24 hour  Intake 236 ml  Output --  Net 236 ml        Physical Exam: Vital Signs Blood pressure (!) 118/53, pulse 92, temperature 98.6 F (37 C), resp. rate 18, height 5' 3 (1.6 m), weight 127.4 kg, SpO2 (!) 50%.    General: awake, alert, appropriate, sitting up in bed; wearing O2; NAD HENT: conjugate gaze; oropharynx  a little dry- O2 in place by [place] CV: regular rate and rhythm- rate in 90's; no JVD Pulmonary: more wheezing and drawing air more loudly- not stridor, but loud- adequate air movement GI: soft, NT, ND, (+)BS- protuberant Psychiatric: appropriate- quiet,  but interactive Neurological: Ox3  Neurological:     Mental Status: She is alert.     Comments: Patient was alert sitting up in bed.  Makes eye contact with examiner.  She does answer basic questions as name age and place.  She does follow simple commands        Assessment/Plan: 1. Functional deficits which require 3+  hours per day of interdisciplinary therapy in a comprehensive inpatient rehab setting. Physiatrist is providing close team supervision and 24 hour management of active medical problems listed below. Physiatrist and rehab team continue to assess barriers to discharge/monitor patient progress toward functional and medical goals  Care Tool:  Bathing              Bathing assist Assist Level: Minimal Assistance - Patient > 75%     Upper Body Dressing/Undressing Upper body dressing   What is the patient wearing?: Dress    Upper body assist Assist Level: Supervision/Verbal cueing    Lower Body Dressing/Undressing Lower body dressing      What is the patient wearing?: Incontinence brief     Lower body assist Assist for lower body dressing: Maximal Assistance - Patient 25 - 49%     Toileting Toileting    Toileting assist Assist for toileting: Moderate Assistance - Patient 50 - 74%     Transfers Chair/bed transfer  Transfers  assist  Chair/bed transfer activity did not occur: Safety/medical concerns  Chair/bed transfer assist level: Contact Guard/Touching assist     Locomotion Ambulation   Ambulation assist      Assist level: 2 helpers Assistive device: Walker-rolling Max distance: 43'   Walk 10 feet activity   Assist     Assist level: Supervision/Verbal cueing Assistive device: Walker-rolling   Walk 50 feet activity   Assist Walk 50 feet with 2 turns activity did not occur: Safety/medical concerns (too weak, unsafe)         Walk 150 feet activity   Assist Walk 150 feet activity did not occur: Safety/medical concerns (too weak, unsafe)         Walk 10 feet on uneven surface  activity   Assist Walk 10 feet on uneven surfaces activity did not occur: Safety/medical concerns (too weak, unsafe)         Wheelchair     Assist Is the patient using a wheelchair?: Yes Type of Wheelchair: Manual    Wheelchair assist level: Total  Assistance - Patient < 25% Max wheelchair distance: >215'    Wheelchair 50 feet with 2 turns activity    Assist        Assist Level: Total Assistance - Patient < 25%   Wheelchair 150 feet activity     Assist      Assist Level: Total Assistance - Patient < 25%   Blood pressure (!) 118/53, pulse 92, temperature 98.6 F (37 C), resp. rate 18, height 5' 3 (1.6 m), weight 127.4 kg, SpO2 (!) 50%.  Medical Problem List and Plan: 1. Functional deficits secondary to debility secondary to exacerbation of myasthenia gravis.  Completed 5-day course of IVIG.  Continue Mestinon  30 mg 5 times daily as well as CellCept              -patient may shower             -ELOS/Goals: 7-10 days, supervision PT, sup-min OT, supervision SLP  Con't CIR PT, OT And SLP  Pt feels like something is stuck- asked pt to use nebs prn more often today 2.  Antithrombotics: -DVT/anticoagulation:  Mechanical: Antiembolism stockings, thigh (TED hose) Bilateral lower extremities.  Venous Doppler studies negative 8/21- will start Lovenox  since at high risk for DVT/PE- and on no meds 8/22- moved dose to 60 mg per pharmacy             -antiplatelet therapy: N/A 3. 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 continue Voltaren  gel 4 times daily -prn tylenol   -might benefit from brace with activity 8/20- pt asking to increase due to neuropathy, but explained we just increased Lyrica  4. Mood/Behavior/Sleep: Provide emotional support             -antipsychotic agents: Abilify  2 mg daily 5. Neuropsych/cognition: This patient is capable of making decisions on her own behalf. 6. Skin/Wound Care: Routine skin checks 7. Fluids/Electrolytes/Nutrition: Routine in and outs with follow-up chemistries 8.  Acute on chronic respiratory failure with hypoxia and hypercarbia.  Extubated 8/10.  Continue BiPAP.   Continue nebulizers as directed.   -Patient on 2 L O2 nasal cannula prior to admission -keep HOB at 30 degrees 8/20- Pt's CO2 has been high - last checked 8 days prior on BMP- her CO2 is 40 today- 2 points above last check- will recheck in AM- probably need to  reduce O2 since CO2 trapping and might need Pulmonary consult/ABG if no better in AM- will write to keep O2 sats at ~ 93-96% and see if can reduce O2 usage. Reached out to nursing.  8/21- d/w PA at length about this- has this chronically- will monitor clinically and make sure no decline in cognition/alertness- if so, will call Pulmonary 8/22- Sounds like louder drawing air- not exactly stridor, but she describes something stuck- asked her to take prns nebs more frequently today- already added Mucinex /Dextromethorphan  9.  Community-acquired pneumonia.  Antibiotic course completed.  Check oxygen  saturations every shift 11.  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  Dietary follow-up 12.  Hypertension.  Cozaar  50 mg daily.  Monitor with increased mobility  8/22- BP doing ok so far- will monitor  13.  Hypothyroidism.  Synthroid  14.  Splenic laceration/hematoma.  Recommendations of supportive care per general surgery 15.  Normocytic anemia.  Follow-up CBC   8/20- Hb 10.7- stable- will con't to monitor 16. Ear wax? Blocking R ear/  8/21- will order Debrox 5 drips BID for R ear x 4 days- see if Sx's improve   I spent a total of  52  minutes on total care today- >50% coordination of care- due to  D/w PA about her breathing and to keep an eye on her- - made meds changes, d/w pt and nursing about her breathing as well as increasing prn neb use today    LOS: 3 days A FACE TO FACE EVALUATION WAS PERFORMED  Taiana Temkin 09/10/2023, 7:34 AM

## 2023-09-10 NOTE — Progress Notes (Signed)
   09/10/23 2254  BiPAP/CPAP/SIPAP  $ Non-Invasive Ventilator  Non-Invasive Vent Subsequent  BiPAP/CPAP/SIPAP Pt Type Adult  BiPAP/CPAP/SIPAP DREAMSTATIOND  Mask Type Full face mask  Mask Size Medium  IPAP 12 cmH20  EPAP 6 cmH2O  Flow Rate 5 lpm  Patient Home Machine No  Patient Home Mask No  Patient Home Tubing No  Auto Titrate No  CPAP/SIPAP surface wiped down Yes  Device Plugged into RED Power Outlet Yes  BiPAP/CPAP /SiPAP Vitals  Pulse Rate 90  Resp 18  SpO2 91 %  Bilateral Breath Sounds Clear;Diminished  MEWS Score/Color  MEWS Score 0  MEWS Score Color [place]

## 2023-09-10 NOTE — Progress Notes (Signed)
 Occupational Therapy Session Note  Patient Details  Name: QUINNLYN HEARNS MRN: [id number] Date of Birth: [birth year]  Today's Date: 09/10/2023 OT Individual Time: 1350-1432 OT Individual Time Calculation (min): 42 min    Short Term Goals: Week 1:  OT Short Term Goal 1 (Week 1): STG=LTG d/t ELOS  Skilled Therapeutic Interventions/Progress Updates:  Skilled OT session completed to address ADL retraining, and functional endurance. Pt received sitting in wc with receiving 2L O2, agreeable to participate in therapy. Pt reports no pain.   Pt completed STS at [facility] with CGA to perform functional reach retrieving 10/10 items at mirror crossing midline to increase endurance and activity tolerance. Intermittent rest breaks required during activity d/t pt becoming SOB, O2 sats monitored throughout session, fluctuating between 91-97.  Pt displays carryover performing pursed lip breathing, education provided on energy conservation techniques in the home to increase activity tolerance. Pt verbalized understanding.   LB dressing simulation performed with yellow theraband standing throughout task with CGA. VC to remove theraband from waist to knee level in sitting to conserve energy. Required extra time to pick up theraband off the floor. Pt completed 2x15 seated rows with yellow theraband requiring 1 rest break.    Pt returned to room ambulated 65ft wc>bed with RW requiring CGA to manage O2 lines, hand off to NT for toileting d/t session ending.   Therapy Documentation Precautions:  Precautions Precautions: Fall Recall of Precautions/Restrictions: Intact Precaution/Restrictions Comments: oxygen  2L Restrictions Weight Bearing Restrictions Per Provider Order: No Therapy/Group: Individual Therapy  Sallie Maker [place], MS, OTR/L 09/10/2023, 11:22 AM

## 2023-09-10 NOTE — Progress Notes (Signed)
 Physical Therapy Session Note  Patient Details  Name: [name] MRN: [id number] Date of Birth: Jun 19, 1965  Today's Date: 09/10/2023 PT Individual Time: 0800-0900 PT Individual Time Calculation (min): 60 min   Short Term Goals: Week 1:  PT Short Term Goal 1 (Week 1): = LTG Week 2:     Skilled Therapeutic Interventions/Progress Updates:  Today's treatment focused on improvement of  BLE strengthening, ambulation, dressing, and step navigation. This was accomplished using the exercises and transfers listed below. Pt showed  great tolerance to administered treatment with no adverse effects by the end of session. Pt only required intermittent breaks to avoid overexertion. Skilled intervention was utilized via activity modification for pt tolerance with task completion, functional progression/regression promoting best outcomes inline with current rehab goals, as well as moderate verbal/tactile cuing. Continue with therapeutic focus on step/stair navigation, prolonged ambulation and BLE strengthening.  Alarm was set, call bell and all necessities were in reach.  Exercises: STS w/FWW; cga- min a. From low surface 6 step up w/FWW Marching w/ ue a. PRN Ambulation 150' w/ FWW; CGA  Transfers: STS: CGA, min a. From low surface. Toilet transfer: min a  Therapy Documentation Precautions:  Precautions Precautions: Fall Recall of Precautions/Restrictions: Intact Precaution/Restrictions Comments: oxygen  2L Restrictions Weight Bearing Restrictions Per Provider Order: No   Pain: 5/10 R knee pain     Therapy/Group: Individual Therapy  Mabel Kiang, PT, DPT 09/10/2023, 12:30 PM

## 2023-09-10 NOTE — Progress Notes (Signed)
 Physical Therapy Session Note  Patient Details  Name: [name] MRN: [id number] Date of Birth: 09-15-1965  Today's Date: 09/10/2023 PT Individual Time: 0930-1033 PT Individual Time Calculation (min): 63 min   Short Term Goals: Week 1:  PT Short Term Goal 1 (Week 1): = LTG  Skilled Therapeutic Interventions/Progress Updates:    Pt up in w/c when PT arrived, pt agreeable for session.  Pt amb 120' with RW with w/c and oxygen  following; before needing to rest in w/c. Pt wheeled pt to ortho gym. Pt performed UBE x12' with x3 rest breaks level 1.  Standing with RW ex's: hip x 3 way x10 each, marching x10; seated LAQ 2x10, marching 2x10, step outs 2x10. Pt rest as needed due to fatigue. Pt c/o fatigue, wheeled pt back to room and pt requesting to use restroom. Nsg entered and called for tech to assist.  Pt amb from w/c to commode with CGA/supervision, pt able to stand with RW for PT to doff undergarments, transfer onto commode with supervision.  Pt passed off to Nsg.   Therapy Documentation Precautions:  Precautions Precautions: Fall Recall of Precautions/Restrictions: Intact Precaution/Restrictions Comments: oxygen  2L Restrictions Weight Bearing Restrictions Per Provider Order: No  Pain: 5/10 right knee pain    Therapy/Group: Individual Therapy  [name] Fast 09/10/2023, 12:50 PM

## 2023-09-11 ENCOUNTER — Inpatient Hospital Stay (HOSPITAL_COMMUNITY)

## 2023-09-11 MED ORDER — MENTHOL 3 MG MT LOZG: 1.0000 | LOZENGE | OROMUCOSAL | Status: DC | PRN

## 2023-09-11 NOTE — Progress Notes (Signed)
 Occupational Therapy Session Note  Patient Details  Name: [name] MRN: [id number] Date of Birth: Oct 09, 1965  Today's Date: 09/11/2023 OT Individual Time: [phone] OT Individual Time Calculation (min): 73 min    Short Term Goals: Week 1:  OT Short Term Goal 1 (Week 1): STG=LTG d/t ELOS  Skilled Therapeutic Interventions/Progress Updates: Patient received resting in bed. Agreeable to OT treatment. Patient assisted with bathing and dressing tasks as listed below. Good activity tolerane and safety awareness during standing and functional mobility with the rolling walker. Continued treatment session with endurance and strengthening exercises using yellow theraBand. 2 x 10 in varied planes with rest breaks between each set.  Continue with skilled OT POC to reach LTG's at a spv level with basic self care.      Therapy Documentation Precautions:  Precautions Precautions: Fall Recall of Precautions/Restrictions: Intact Precaution/Restrictions Comments: oxygen  2L Restrictions Weight Bearing Restrictions Per Provider Order: No General:   Vital Signs: O2 @ 92% following ambulating to and from bathroom. Patient on wall O2 via [place] during ADL's   Pain:No c/o/ pain reports arthritis in knee can cause swelling and pain   ADL: ADL Grooming: Setup Where Assessed-Grooming: Sitting at sink Upper Body Bathing: Supervision/safety Where Assessed-Upper Body Bathing: sponge bathing at sink Lower Body Bathing: Minimal assistance Where Assessed-Lower Body Bathing: sponge bath at sink Upper Body Dressing: Supervision/safety Where Assessed-Upper Body Dressing: Wheelchair Lower Body Dressing: Moderate assistance Where Assessed-Lower Body Dressing: Wheelchair Toileting: Min assistance Where Assessed-Toileting: Teacher, adult education: CGA Statistician Method: Proofreader: Grab bars      Balance- CGA with RW mobility in the room   Exercises:yellow t-band  exercises 2 x 10 for BUE strength and endurance.      Therapy/Group: Individual Therapy  [name] 09/11/2023, 12:11 PM

## 2023-09-11 NOTE — Progress Notes (Addendum)
 Physical Therapy Session Note  Patient Details  Name: [name] MRN: [id number] Date of Birth: 10-Nov-1965  Today's Date: 09/11/2023 PT Individual Time: 1350-1455 PT Individual Time Calculation (min): 65 min   Short Term Goals: Week 1:  PT Short Term Goal 1 (Week 1): = LTG  Skilled Therapeutic Interventions/Progress Updates: Patient supine in bed on entrance to room. Patient difficult to awaken and required increased time/effort to arouse. Pt became alert and agreeable to PT session.   Patient reported no pain during session.  Pt on 2L O2 throughout. Portable oximeter not providing accurate readings (would show desat to 77% but pt not presenting with symptoms). Dynamap recordings showed SpO2 above 97% throughout.   Therapeutic Activity: Bed Mobility: Pt performed supine<>sit on EOB with maxA (HOB elevated). VC required for B UE assistance with elevating trunk to EOB. Transfers: Pt performed sit<>stand transfers throughout session with RW and with modA to stand, and CGA to transfer in RW. Provided VC for reach back to [facility] to control descent.  Therapeutic Exercise: Pt performed the following exercises with therapist providing the described cuing and facilitation for improvement. - B LAQ with 5lb ankle weight donned and cues to control eccentric. 3 x close to fatigue - Passive movement of B LE's into seated hip flexion with VC for pt to control eccentric. 2 x close to fatigue - UBE 8 min (rest breaks required); .1 miles; 63 revolutions; 7 avg RPM (cues throughout to maintain pace with partner of 25 rpm); 1.4 METs. Level 3 resistance first 4 minutes, then level 1 resistance for last 4 minutes.  Patient sitting in WC at end of session with brakes locked and waiting on nsg to arrive to assist with personal care.      Therapy Documentation Precautions:  Precautions Precautions: Fall Recall of Precautions/Restrictions: Intact Precaution/Restrictions Comments: oxygen   2L Restrictions Weight Bearing Restrictions Per Provider Order: No   Therapy/Group: Individual Therapy  Dalesha Stanback PTA 09/11/2023, 3:00 PM

## 2023-09-11 NOTE — Progress Notes (Signed)
 Physical Therapy Session Note  Patient Details  Name: [name] MRN: [id number] Date of Birth: 04/11/1965  Today's Date: 09/11/2023 PT Individual Time: 1115-1200 PT Individual Time Calculation (min): 45 min   Short Term Goals: Week 1:  PT Short Term Goal 1 (Week 1): = LTG  Skilled Therapeutic Interventions/Progress Updates:    Pt asleep in in w/c with head propped on table on arrival but easily roused and agreeable to therapy. Noted pt to be quieter than usual while waking, O2 noted to be as low as 85% after sleeping with mouth open, but improved with cues to breathe through nose and pursed lip breathing. Pt with even greater improvement in sats and affect after using flutter valve and suction to clear secretions. Pt maintained on supp O2 throughout session at 2L.   Pt ambulated to bathroom with CGA and RW. Noted difficulty maintaining sats >93% with activity, so bumped up to 3L for several minutes to compensate for low sats while sleeping. Returned to 2L after several minutes. Max a for Acupuncturist, supervision for hygiene with continent bladder void. Documented in flow sheet. Pt ambulated to sink and washed hands in standing before returning to w/c with up to min a while walking backwards for balance and RW management.   Stand pivot transfer to EOB with CGA and incr time.Pt then returned to bed with max a for BLE management. Performed 2 x 10 heel slides after using flutter valve and suction for secretion management with visible improvement in ease of breathing and affect. Pt remained in bed at end of session and was left with all needs in reach and alarm active.   Therapy Documentation Precautions:  Precautions Precautions: Fall Recall of Precautions/Restrictions: Intact Precaution/Restrictions Comments: oxygen  2L Restrictions Weight Bearing Restrictions Per Provider Order: No General:      Therapy/Group: Individual Therapy  [name] 09/11/2023, 11:49 AM

## 2023-09-11 NOTE — Progress Notes (Signed)
 PROGRESS NOTE   Subjective/Complaints: No events overnight. Still feels like something is stuck in her throat, has had this sensation since her extubation in the hospital; not worsening or improving.  Vitals stable     09/11/2023    1:35 PM 09/11/2023    6:00 AM 09/11/2023    3:53 AM  Vitals with BMI  Weight  283 lbs 5 oz   BMI  50.2   Systolic 119  119  Diastolic 44  58  Pulse 86  96    No results for input(s): GLUCAP in the last 72 hours.   P.o. intakes appropriate  Continent of bladder  Last BM 8/20    ROS: Per HPI Pt denies SOB, abd pain, CP, N/V/C/D, and vision changes   Objective:   No results found. No results for input(s): WBC, HGB, HCT, PLT in the last 72 hours.  Recent Labs    09/09/23 0612  NA 143  K 4.1  CL 92*  CO2 39*  GLUCOSE 113*  BUN 9  CREATININE 0.61  CALCIUM  9.5    Intake/Output Summary (Last 24 hours) at 09/11/2023 1548 Last data filed at 09/11/2023 0723 Gross per 24 hour  Intake 240 ml  Output --  Net 240 ml        Physical Exam: Vital Signs Blood pressure (!) 119/44, pulse 86, temperature 98.3 F (36.8 C), temperature source Axillary, resp. rate 20, height 5' 3 (1.6 m), weight 128.5 kg, SpO2 94%.    General: awake, alert, appropriate, sitting up in bed; wearing O2; NAD HENT: conjugate gaze; oropharynx  a little dry- O2 in place by [place] CV: regular rate and rhythm- rate in 90's; no JVD Pulmonary: on 3 L [place] - R>L lower low rhonchi.  GI: soft, NT, ND, (+)BS- protuberant Psychiatric: appropriate- quiet,  but interactive   Neurologic Exam:   Cog: AAOx3. Normal content, no dysarthria. CN: + R ptosis and exotropia; otherwise normal Babinsky: flexor responses b/l.   Hoffmans: negative b/l Sensory exam: revealed normal sensation in all dermatomal regions in bilateral  UE and LEs. Motor exam: strength normal in all myotomal regions of bilateral UE and  LEs. Coordination: Fine motor coordination was normal.   Tone: No tone or spasticity.      Assessment/Plan: 1. Functional deficits which require 3+ hours per day of interdisciplinary therapy in a comprehensive inpatient rehab setting. Physiatrist is providing close team supervision and 24 hour management of active medical problems listed below. Physiatrist and rehab team continue to assess barriers to discharge/monitor patient progress toward functional and medical goals  Care Tool:  Bathing              Bathing assist Assist Level: Minimal Assistance - Patient > 75%     Upper Body Dressing/Undressing Upper body dressing   What is the patient wearing?: Dress    Upper body assist Assist Level: Supervision/Verbal cueing    Lower Body Dressing/Undressing Lower body dressing      What is the patient wearing?: Incontinence brief     Lower body assist Assist for lower body dressing: Maximal Assistance - Patient 25 - 49%     Toileting Toileting  Toileting assist Assist for toileting: Moderate Assistance - Patient 50 - 74%     Transfers Chair/bed transfer  Transfers assist  Chair/bed transfer activity did not occur: Safety/medical concerns  Chair/bed transfer assist level: Supervision/Verbal cueing     Locomotion Ambulation   Ambulation assist      Assist level: Contact Guard/Touching assist Assistive device: Walker-rolling Max distance: 185'   Walk 10 feet activity   Assist     Assist level: Supervision/Verbal cueing Assistive device: Walker-rolling   Walk 50 feet activity   Assist Walk 50 feet with 2 turns activity did not occur: Safety/medical concerns (too weak, unsafe)  Assist level: Contact Guard/Touching assist Assistive device: Walker-rolling    Walk 150 feet activity   Assist Walk 150 feet activity did not occur: Safety/medical concerns (too weak, unsafe)  Assist level: Contact Guard/Touching assist Assistive device:  Walker-rolling    Walk 10 feet on uneven surface  activity   Assist Walk 10 feet on uneven surfaces activity did not occur: Safety/medical concerns (too weak, unsafe)         Wheelchair     Assist Is the patient using a wheelchair?: Yes Type of Wheelchair: Manual    Wheelchair assist level: Total Assistance - Patient < 25% Max wheelchair distance: >215'    Wheelchair 50 feet with 2 turns activity    Assist        Assist Level: Total Assistance - Patient < 25%   Wheelchair 150 feet activity     Assist      Assist Level: Total Assistance - Patient < 25%   Blood pressure (!) 119/44, pulse 86, temperature 98.3 F (36.8 C), temperature source Axillary, resp. rate 20, height 5' 3 (1.6 m), weight 128.5 kg, SpO2 94%.  Medical Problem List and Plan: 1. Functional deficits secondary to debility secondary to exacerbation of myasthenia gravis.  Completed 5-day course of IVIG.  Continue Mestinon  30 mg 5 times daily as well as CellCept              -patient may shower             -ELOS/Goals: 7-10 days, supervision PT, sup-min OT, supervision SLP  Con't CIR PT, OT And SLP  Pt feels like something is stuck- asked pt to use nebs prn more often today  2.  Antithrombotics: -DVT/anticoagulation:  Mechanical: Antiembolism stockings, thigh (TED hose) Bilateral lower extremities.  Venous Doppler studies negative 8/21- will start Lovenox  since at high risk for DVT/PE- and on no meds 8/22- moved dose to 60 mg per pharmacy             -antiplatelet therapy: N/A  3. 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 continue Voltaren  gel 4 times daily -prn tylenol   -might benefit from brace with activity 8/20- pt asking to increase due to neuropathy, but explained we just increased Lyrica   4. Mood/Behavior/Sleep: Provide emotional support              -antipsychotic agents: Abilify  2 mg daily 5. Neuropsych/cognition: This patient is capable of making decisions on her own behalf. 6. Skin/Wound Care: Routine skin checks 7. Fluids/Electrolytes/Nutrition: Routine in and outs with follow-up chemistries  8.  Acute on chronic respiratory failure with hypoxia and hypercarbia.  Extubated 8/10.  Continue BiPAP.  Continue nebulizers as directed.   -Patient on 2 L O2 nasal cannula prior to admission -keep  HOB at 30 degrees 8/20- Pt's CO2 has been high - last checked 8 days prior on BMP- her CO2 is 40 today- 2 points above last check- will recheck in AM- probably need to reduce O2 since CO2 trapping and might need Pulmonary consult/ABG if no better in AM- will write to keep O2 sats at ~ 93-96% and see if can reduce O2 usage. Reached out to nursing.  8/21- d/w PA at length about this- has this chronically- will monitor clinically and make sure no decline in cognition/alertness- if so, will call Pulmonary 8/22- Sounds like louder drawing air- not exactly stridor, but she describes something stuck- asked her to take prns nebs more frequently today- already added Mucinex /Dextromethorphan   8/23: Satting well, continue current management. Will get CXR for lower lobe rhonchi today.   9.  Community-acquired pneumonia.  Antibiotic course completed.  Check oxygen  saturations every shift   - stable  11.  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  Dietary follow-up  12.  Hypertension.  Cozaar  50 mg daily.  Monitor with increased mobility  8/22- BP doing ok so far- will monitor    - stable    09/11/2023    1:35 PM 09/11/2023    6:00 AM 09/11/2023    3:53 AM  Vitals with BMI  Weight  283 lbs 5 oz   BMI  50.2   Systolic 119  119  Diastolic 44  58  Pulse 86  96     13.  Hypothyroidism.  Synthroid  14.  Splenic laceration/hematoma.  Recommendations of supportive care per general surgery  15.  Normocytic anemia.  Follow-up CBC   8/20- Hb 10.7- stable- will con't to  monitor  16. Ear wax? Blocking R ear/  8/21- will order Debrox 5 drips BID for R ear x 4 days- see if Sx's improve  17. Globus sensation. Has been occurring since extubation. Provided reassurance, place order for PRN throat lozenges.   LOS: 4 days A FACE TO FACE EVALUATION WAS PERFORMED  Joesph JAYSON Likes 09/11/2023, 3:48 PM

## 2023-09-11 NOTE — Progress Notes (Signed)
 Physical Therapy Session Note  Patient Details  Name: [name] MRN: [id number] Date of Birth: 11-01-1965  Today's Date: 09/10/2023 PT Individual Time: 1301-1339 PT Individual Time Calculation (min): 38 min   Short Term Goals: Week 1:  PT Short Term Goal 1 (Week 1): = LTG  Skilled Therapeutic Interventions/Progress Updates:  Patient seated on toilet on entrance to room. Patient alert and agreeable to PT session. No O2 donned.   Patient with no pain complaint at start of session. Noted to be slightly SOB. Pt stands from toilet with light CGA and is able to don pants with CGA. Ambulated 10' into room and sits to w/c with overall light CGA d/t SOB. SpO2 at 47% and O2 donned. O2 levels quickly rise to 93% within with PLB and ultimately returns to 97%. Seated rest break provided for pt to normalize. Retrieved additional 5' extension for O2 tubing for future bathroom trips in room.   After, she is able to ambulate out of room and up/ down hallway reaching 100' x2. Required several brief standing rest breaks to complete each bout. Uses RW and demos very slow but consistent pace with overall close supervision with close w/c follow.    Patient seated upright in w/c  at end of session with brakes locked, belt alarm set, and all needs within reach.   Therapy Documentation Precautions:  Precautions Precautions: Fall Recall of Precautions/Restrictions: Intact Precaution/Restrictions Comments: oxygen  2L Restrictions Weight Bearing Restrictions Per Provider Order: No  Pain:  No pain related during session. With 2L O2, pt's SpO2 WFL throughout session and no SOB noted.    Therapy/Group: Individual Therapy  Mliss DELENA Milliner PT, DPT, CSRS 09/10/2023, 1:25 PM

## 2023-09-11 NOTE — Plan of Care (Signed)
  Problem: Consults Goal: RH GENERAL PATIENT EDUCATION Description: See Patient Education module for education specifics. Outcome: Progressing   Problem: RH BOWEL ELIMINATION Goal: RH STG MANAGE BOWEL WITH ASSISTANCE Description: STG Manage Bowel with mod I Assistance. Outcome: Progressing   Problem: RH SAFETY Goal: RH STG ADHERE TO SAFETY PRECAUTIONS W/ASSISTANCE/DEVICE Description: STG Adhere to Safety Precautions With cues Assistance/Device. Outcome: Progressing

## 2023-09-12 MED ORDER — ACETAMINOPHEN 500 MG PO TABS
1000.0000 mg | ORAL_TABLET | Freq: Three times a day (TID) | ORAL | Status: DC
  Administered 2023-09-12 – 2023-09-22 (×29): 1000 mg via ORAL
  Filled 2023-09-12 (×29): qty 2

## 2023-09-12 MED ORDER — FUROSEMIDE 20 MG PO TABS
20.0000 mg | ORAL_TABLET | Freq: Every day | ORAL | Status: DC
  Administered 2023-09-12 – 2023-09-22 (×11): 20 mg via ORAL
  Filled 2023-09-12 (×11): qty 1

## 2023-09-12 NOTE — Progress Notes (Signed)
 Physical Therapy Session Note  Patient Details  Name: [name] MRN: [id number] Date of Birth: Oct 29, 1965  Today's Date: 09/12/2023 PT Individual Time: 1400-1500 PT Individual Time Calculation (min): 60 min   Short Term Goals: Week 1:  PT Short Term Goal 1 (Week 1): = LTG  Skilled Therapeutic Interventions/Progress Updates:    Pt in semi reclined in bed with family visiting when PT arrived, pt agreeable for session.  Pt reports 10/10 right knee pain.  Pt performed supine->sit with HOB elevated, using railing; mod assist for trunk support into midline.  Pt able to position LE's to floor, very slowly, holding self up with UE @ EOB.  Sit->stand from bed->RW with supervision without raising bed today. Movement is slow and antalgic.  Pt attempted to amb to ortho gym, however, right knee pain limiting her ability to tolerate weight bearing.  Pt wheeled to gym in w/c.  Pt performed stand pivot transfer w/c->NuStep, vc for hand placement, vc to scoot back into chair using hips/UE for support. NuStep x12' level 3, SPM 24-27, no rest break.  Seated w/c LAQs, leg presses into ball; 3x15. Gait tx with RW 2x75' (rest between) with Supervision with w/c and oxygen  following.  Pt demonstrates good stride length, upright posture, minimal antalgia with RLE weight acceptance. Sit<->stand w/c<->RW supervision performing appropriate sequencing. Pt returned to room to sit up @ EOB with tray, all items within reach, passing off to Nsg. Wall O2 4L/min via [place]   Therapy Documentation Precautions:  Precautions Precautions: Fall Recall of Precautions/Restrictions: Intact Precaution/Restrictions Comments: oxygen  2L Restrictions Weight Bearing Restrictions Per Provider Order: No    Pain: 10/10 right knee    Therapy/Group: Individual Therapy  [name] Fast 09/12/2023, 2:33 PM

## 2023-09-12 NOTE — Plan of Care (Signed)
  Problem: Consults Goal: RH GENERAL PATIENT EDUCATION Description: See Patient Education module for education specifics. Outcome: Progressing   Problem: RH SAFETY Goal: RH STG ADHERE TO SAFETY PRECAUTIONS W/ASSISTANCE/DEVICE Description: STG Adhere to Safety Precautions With cues Assistance/Device. Outcome: Progressing

## 2023-09-12 NOTE — Progress Notes (Signed)
 PROGRESS NOTE   Subjective/Complaints: No events overnight.  Vitals remained stable. Chest x-ray yesterday with persistent bilateral pleural effusions, along with some central pulmonary edema.  Did not seem significantly increased from prior x-rays.  Patient still complains of feeling like something is stuck in her throat.  Has not been coughing anything up.  Having some significant pain in her right knee today.  No improvement with Voltaren  gel.  ROS: Per HPI Pt denies SOB, abd pain, CP, N/V/C/D, and vision changes + Throat soreness + Right knee pain  Objective:   DG CHEST PORT 1 VIEW Result Date: 09/11/2023 CLINICAL DATA:  [id number]. Shortness of breath with rhonchi at the lung bases. EXAM: PORTABLE CHEST 1 VIEW COMPARISON:  Portable chest 08/27/2023. FINDINGS: Interval NGT/ETT removal. Stable cardiomegaly, scattered upper mediastinal surgical clips and intact sternotomy sutures. There is vascular prominence of both hila with vascular congestion and flow cephalization. There is no overt pulmonary edema. Again noted is a moderate left pleural effusion with overlying atelectasis or consolidation. Trace right pleural fluid. Remaining lungs generally clear. Stable mediastinum with aortic atherosclerosis. No new osseous finding. IMPRESSION: 1. Cardiomegaly with vascular congestion and flow cephalization. No overt pulmonary edema. 2. Moderate left pleural effusion with overlying atelectasis or consolidation, seen previously. 3. Trace right pleural fluid. 4. Aortic atherosclerosis. Electronically Signed   By: Francis Quam M.D.   On: 09/11/2023 20:04   No results for input(s): WBC, HGB, HCT, PLT in the last 72 hours.  No results for input(s): NA, K, CL, CO2, GLUCOSE, BUN, CREATININE, CALCIUM  in the last 72 hours.   Intake/Output Summary (Last 24 hours) at 09/12/2023 1610 Last data filed at 09/12/2023 1313 Gross per  24 hour  Intake 354.5 ml  Output --  Net 354.5 ml        Physical Exam: Vital Signs Blood pressure (!) 110/50, pulse 77, temperature 99.1 F (37.3 C), temperature source Oral, resp. rate 18, height 5' 3 (1.6 m), weight 128.3 kg, SpO2 97%.    General: awake, alert, appropriate, sitting up in bed; wearing O2; NAD HENT: conjugate gaze; oropharynx  a little dry- O2 in place by [place] CV: regular rate and rhythm- rate in 90's; no JVD Pulmonary: on 3 L [place] - R>L lower low rhonchi.  Unchanged from prior exam. GI: soft, NT, ND, (+)BS- protuberant Psychiatric: appropriate- quiet,  but interactive   Neurologic Exam:   Cog: AAOx3. Normal content, no dysarthria. CN: + R ptosis and exotropia; otherwise normal Babinsky: flexor responses b/l.   Hoffmans: negative b/l Sensory exam: revealed normal sensation in all dermatomal regions in bilateral  UE and LEs. Motor exam: strength normal in all myotomal regions of bilateral UE and LEs. Coordination: Fine motor coordination was normal.   Tone: No tone or spasticity.      Assessment/Plan: 1. Functional deficits which require 3+ hours per day of interdisciplinary therapy in a comprehensive inpatient rehab setting. Physiatrist is providing close team supervision and 24 hour management of active medical problems listed below. Physiatrist and rehab team continue to assess barriers to discharge/monitor patient progress toward functional and medical goals  Care Tool:  Bathing  Bathing assist Assist Level: Minimal Assistance - Patient > 75%     Upper Body Dressing/Undressing Upper body dressing   What is the patient wearing?: Dress    Upper body assist Assist Level: Supervision/Verbal cueing    Lower Body Dressing/Undressing Lower body dressing      What is the patient wearing?: Incontinence brief     Lower body assist Assist for lower body dressing: Maximal Assistance - Patient 25 - 49%     Toileting Toileting     Toileting assist Assist for toileting: Moderate Assistance - Patient 50 - 74%     Transfers Chair/bed transfer  Transfers assist  Chair/bed transfer activity did not occur: Safety/medical concerns  Chair/bed transfer assist level: Supervision/Verbal cueing     Locomotion Ambulation   Ambulation assist      Assist level: Supervision/Verbal cueing Assistive device: Walker-rolling Max distance: 150'   Walk 10 feet activity   Assist     Assist level: Supervision/Verbal cueing Assistive device: Walker-rolling   Walk 50 feet activity   Assist Walk 50 feet with 2 turns activity did not occur: Safety/medical concerns (too weak, unsafe)  Assist level: Supervision/Verbal cueing Assistive device: Walker-rolling    Walk 150 feet activity   Assist Walk 150 feet activity did not occur: Safety/medical concerns (too weak, unsafe)  Assist level: Supervision/Verbal cueing Assistive device: Walker-rolling    Walk 10 feet on uneven surface  activity   Assist Walk 10 feet on uneven surfaces activity did not occur: Safety/medical concerns (too weak, unsafe)         Wheelchair     Assist Is the patient using a wheelchair?: Yes Type of Wheelchair: Manual    Wheelchair assist level: Total Assistance - Patient < 25% Max wheelchair distance: >215'    Wheelchair 50 feet with 2 turns activity    Assist        Assist Level: Total Assistance - Patient < 25%   Wheelchair 150 feet activity     Assist      Assist Level: Total Assistance - Patient < 25%   Blood pressure (!) 110/50, pulse 77, temperature 99.1 F (37.3 C), temperature source Oral, resp. rate 18, height 5' 3 (1.6 m), weight 128.3 kg, SpO2 97%.  Medical Problem List and Plan: 1. Functional deficits secondary to debility secondary to exacerbation of myasthenia gravis.  Completed 5-day course of IVIG.  Continue Mestinon  30 mg 5 times daily as well as CellCept              -patient may  shower             -ELOS/Goals: 7-10 days, supervision PT, sup-min OT, supervision SLP  Con't CIR PT, OT And SLP  Pt feels like something is stuck- asked pt to use nebs prn more often today  2.  Antithrombotics: -DVT/anticoagulation:  Mechanical: Antiembolism stockings, thigh (TED hose) Bilateral lower extremities.  Venous Doppler studies negative 8/21- will start Lovenox  since at high risk for DVT/PE- and on no meds 8/22- moved dose to 60 mg per pharmacy             -antiplatelet therapy: N/A  3. 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 continue Voltaren  gel 4 times daily -prn tylenol   -might benefit from brace with activity 8/20- pt asking to increase due to neuropathy, but explained we just increased Lyrica  8-24: Continue Voltaren  gel  to right knee, scheduled Tylenol  1000 mg 3 times daily.  Given persistence of pain, discussed with family possible steroid injection into right knee; patient and family are agreeable, will defer to primary team  4. Mood/Behavior/Sleep: Provide emotional support             -antipsychotic agents: Abilify  2 mg daily 5. Neuropsych/cognition: This patient is capable of making decisions on her own behalf. 6. Skin/Wound Care: Routine skin checks 7. Fluids/Electrolytes/Nutrition: Routine in and outs with follow-up chemistries  8.  Acute on chronic respiratory failure with hypoxia and hypercarbia.  Extubated 8/10.  Continue BiPAP.  Continue nebulizers as directed.   -Patient on 2 L O2 nasal cannula prior to admission -keep HOB at 30 degrees 8/20- Pt's CO2 has been high - last checked 8 days prior on BMP- her CO2 is 40 today- 2 points above last check- will recheck in AM- probably need to reduce O2 since CO2 trapping and might need Pulmonary consult/ABG if no better in AM- will write to keep O2 sats at ~ 93-96% and see if can reduce O2  usage. Reached out to nursing.  8/21- d/w PA at length about this- has this chronically- will monitor clinically and make sure no decline in cognition/alertness- if so, will call Pulmonary 8/22- Sounds like louder drawing air- not exactly stridor, but she describes something stuck- asked her to take prns nebs more frequently today- already added Mucinex /Dextromethorphan   8/23: Satting well, continue current management. Will get CXR for lower lobe rhonchi today.   8-24: Persistent bilateral pleural effusions, some vascular prominence.  Start Lasix  20 mg daily.  Demonstrated ISB with patient and discussed compliance with 10 reps every 2 hours while awake  9.  Community-acquired pneumonia.  Antibiotic course completed.  Check oxygen  saturations every shift   - stable  11.  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  Dietary follow-up  12.  Hypertension.  Cozaar  50 mg daily.  Monitor with increased mobility  8/22- BP doing ok so far- will monitor   8-24: Artery Lasix  as above.  Monitor for hypotension.    09/12/2023    3:09 AM 09/12/2023    2:53 AM 09/11/2023    8:51 PM  Vitals with BMI  Weight  282 lbs 14 oz   BMI  50.12   Systolic 110    Diastolic 50    Pulse 77  88     13.  Hypothyroidism.  Synthroid  14.  Splenic laceration/hematoma.  Recommendations of supportive care per general surgery  15.  Normocytic anemia.  Follow-up CBC   8/20- Hb 10.7- stable- will con't to monitor  16. Ear wax? Blocking R ear/  8/21- will order Debrox 5 drips BID for R ear x 4 days- see if Sx's improve  17. Globus sensation. Has been occurring since extubation. Provided reassurance, place order for PRN throat lozenges.   LOS: 5 days A FACE TO FACE EVALUATION WAS PERFORMED  Joesph JAYSON Likes 09/12/2023, 4:10 PM

## 2023-09-13 DIAGNOSIS — J9620 Acute and chronic respiratory failure, unspecified whether with hypoxia or hypercapnia: Secondary | ICD-10-CM

## 2023-09-13 DIAGNOSIS — I10 Essential (primary) hypertension: Secondary | ICD-10-CM

## 2023-09-13 MED ORDER — TRAMADOL HCL 50 MG PO TABS: 50.0000 mg | ORAL_TABLET | Freq: Four times a day (QID) | ORAL | Status: DC | PRN

## 2023-09-13 MED ORDER — BUPIVACAINE HCL (PF) 0.5 % IJ SOLN
10.0000 mL | Freq: Once | INTRAMUSCULAR | Status: AC
  Administered 2023-09-14: 10 mL
  Filled 2023-09-13 (×2): qty 10

## 2023-09-13 MED ORDER — METHYLPREDNISOLONE ACETATE 80 MG/ML IJ SUSP
80.0000 mg | Freq: Once | INTRAMUSCULAR | Status: AC
  Administered 2023-09-14: 80 mg via INTRA_ARTICULAR
  Filled 2023-09-13: qty 1

## 2023-09-13 NOTE — Progress Notes (Signed)
 Patient ID: MILTON STREICHER, female   DOB: 1965/04/14, 58 y.o.   MRN: [id number]  Request received for knee injection. Medication and/or pt not ready for injection by end of day. Please reach out tomorrow when meds are available, consent signed, and pt in bed ready for injection.    [name], PA-C Orthopedic Surgery [phone]

## 2023-09-13 NOTE — Progress Notes (Signed)
   09/13/23 2120  BiPAP/CPAP/SIPAP  BiPAP/CPAP/SIPAP Pt Type Adult  BiPAP/CPAP/SIPAP Resmed  Mask Size Medium  Set Rate 0 breaths/min  Respiratory Rate 16 breaths/min  IPAP 12 cmH20  EPAP 6 cmH2O  Flow Rate 3 lpm  Patient Home Machine No  Patient Home Mask No  Patient Home Tubing No  Auto Titrate No  Device Plugged into RED Power Outlet Yes

## 2023-09-13 NOTE — Progress Notes (Signed)
 Occupational Therapy Session Note  Patient Details  Name: [name] MRN: [id number] Date of Birth: [birth year]  Today's Date: 09/13/2023 OT Individual Time: 9264-9197 OT Individual Time Calculation (min): 27 min    Short Term Goals: Week 1:  OT Short Term Goal 1 (Week 1): STG=LTG d/t ELOS  Skilled Therapeutic Interventions/Progress Updates:    Pt received sitting EOB with 7/10 pain in her R knee. Notified RN who delivered pain medication and OT applied Voltaren  cream to her knee. Deferred further mobility 2/2 knee pain per pt request. She completed a stand pivot to the w/c with the RW with CGA, requiring x2 attempts to stand with cueing for forward trunk lean/weight shift. Pt was taken via w/c to the therapy gym for time management. Pt completed the BUE ergometer to challenge BUE strength and endurance needed to complete ADLs and IADLs with the highest level of independence. Pt completed 9 min with cueing for pacing and technique. She returned to her room following. Pt was left sitting up in the wheelchair with all needs met and call bell within reach. She was on 4L O2 upon entry to the room, bumped down to 2L and SpO2 remained 99%.   Therapy Documentation Precautions:  Precautions Precautions: Fall Recall of Precautions/Restrictions: Intact Precaution/Restrictions Comments: oxygen  2L Restrictions Weight Bearing Restrictions Per Provider Order: No  Therapy/Group: Individual Therapy  [name] 09/13/2023, 7:58 AM

## 2023-09-13 NOTE — Progress Notes (Addendum)
 Physical Therapy Session Note  Patient Details  Name: [name] MRN: [id number] Date of Birth: 02/29/1965  Today's Date: 09/13/2023 PT Individual Time: 9054-8969 PT Individual Time Calculation (min): 45 min  PT Individual Time: 1345-1500 PT Individual Time Calculation (min): 75 min   Short Term Goals: Week 1:  PT Short Term Goal 1 (Week 1): = LTG  Skilled Therapeutic Interventions/Progress Updates:    Pt up in w/c when PT arrived, pt agreeable for session.  Pt reports 7/10 right knee pain.  Pt states tylenol  was given, but doesn't help pain, as well as Voltaren  topical cream.  Pt transferred into bed with mod assist for LE placement with HOB lowered. Pt using railing to support upper body. In supine, pt performed quad sets x5 holds x15 right knee, B heel slides x10, B hip abd/add x10.  Moderate TTP to right tibfib prox joint, lateral patellar.  PT performed patella mobs for pain, applied heat pad x8' to right knee.  Pt transferred to EOB with CGA.  Pt performed LAQ, HS curls with yellow TB x15 each.  Standing with RW heel raises 2x10, step outs x10, step touch forward x10, marching x15, hip extension x10.  Pt reports 5/10 right knee pain, states, It feels better post session, up in w/c with all items within reach.   Second session: Pt in w/c, agreeable for session when PT arrived.  Pt denies right knee pain. Pt amb from room to ortho room with RW.  Pt needed undergarments adjusted ~x45' with pt standing with RW, however, pt started to lean to right and grabbed wall rail for support stated she felt dizzy.  Pt denies any other symptoms, recovered quickly and willing to amb again. Pt demonstrates good gait cadence, stride length.  Pt made it to ortho gym, sat on ex mat. Pt performed 2x10 sit to stand from mat to RW, LAQs, marching; standing with RW heel raises, step touch 6 step, lunges on 6 step. Pt resting as needed.  Pt c/o stomach boiling and would be more comfortable in room if  she needs to have a BM. Pt wheeled back to room to save time. Pt performed standing hip extension, forward leg kicks, marching, heel raises; transferred from standing to supine with CGA instructing pt on proper LE placement into bed. Pt performed rolling side to side with supervision and use of bed railings. Supine heel slides, yellow TB resistance:  HL clams, hip abd/add, DF/PF; x15 each. Pt reports fatigue near end of session, requesting to remain in bed, all items within reach.      Therapy Documentation Precautions:  Precautions Precautions: Fall Recall of Precautions/Restrictions: Intact Precaution/Restrictions Comments: oxygen  2L Restrictions Weight Bearing Restrictions Per Provider Order: No    Pain: Pain Assessment Pain Scale: 0-10 Pain Score: 7  Pain Location: Knee Pain Intervention(s): Medication (See eMAR)     Therapy/Group: Individual Therapy  [name] Fast 09/13/2023, 10:14 AM

## 2023-09-13 NOTE — Progress Notes (Signed)
 Orthopedic Tech Progress Note Patient Details:  RICKITA FORSTNER 12/25/1965 [id number]  Ortho Devices Type of Ortho Device: Knee Sleeve Ortho Device/Splint Location: RLE Ortho Device/Splint Interventions: Ordered, Application, Removaldropped off a 3XL knee brace with hinged in it. Notify LPN   Post Interventions Patient Tolerated: Well Instructions Provided: Care of device  Delanna LITTIE Pac 09/13/2023, 12:24 PM

## 2023-09-13 NOTE — Progress Notes (Addendum)
 PROGRESS NOTE   Subjective/Complaints: Continues to report R knee pain, not improved with voltaren . No additional concerns.   ROS: Per HPI Pt denies SOB, abd pain, CP, N/V/C/D, and vision changes + Throat soreness + Right knee pain  Objective:   DG CHEST PORT 1 VIEW Result Date: 09/11/2023 CLINICAL DATA:  [id number]. Shortness of breath with rhonchi at the lung bases. EXAM: PORTABLE CHEST 1 VIEW COMPARISON:  Portable chest 08/27/2023. FINDINGS: Interval NGT/ETT removal. Stable cardiomegaly, scattered upper mediastinal surgical clips and intact sternotomy sutures. There is vascular prominence of both hila with vascular congestion and flow cephalization. There is no overt pulmonary edema. Again noted is a moderate left pleural effusion with overlying atelectasis or consolidation. Trace right pleural fluid. Remaining lungs generally clear. Stable mediastinum with aortic atherosclerosis. No new osseous finding. IMPRESSION: 1. Cardiomegaly with vascular congestion and flow cephalization. No overt pulmonary edema. 2. Moderate left pleural effusion with overlying atelectasis or consolidation, seen previously. 3. Trace right pleural fluid. 4. Aortic atherosclerosis. Electronically Signed   By: Francis Quam M.D.   On: 09/11/2023 20:04   No results for input(s): WBC, HGB, HCT, PLT in the last 72 hours.  No results for input(s): NA, K, CL, CO2, GLUCOSE, BUN, CREATININE, CALCIUM  in the last 72 hours.   Intake/Output Summary (Last 24 hours) at 09/13/2023 1124 Last data filed at 09/13/2023 0916 Gross per 24 hour  Intake 476 ml  Output --  Net 476 ml        Physical Exam: Vital Signs Blood pressure (!) 149/60, pulse 95, temperature 99 F (37.2 C), resp. rate 18, height 5' 3 (1.6 m), weight 126.6 kg, SpO2 96%.    General: awake, alert, appropriate, sitting up in bed; wearing O2; NAD HENT: conjugate gaze; oropharynx   a little dry- O2 in place by [place] CV: regular rate and rhythm- rate in 90's; no JVD Pulmonary: on 3 L [place] - R>L lower low rhonchi.  Unchanged from prior exam. GI: soft, NT, ND, (+)BS- protuberant Psychiatric: appropriate- quiet,  but interactive   Neurologic Exam:   Cog: AAOx3. Normal content, no dysarthria. CN: + R ptosis and exotropia; otherwise normal Babinsky: flexor responses b/l.   Hoffmans: negative b/l Sensory exam: revealed normal sensation in all dermatomal regions in bilateral  UE and LEs. Motor exam: strength normal in all myotomal regions of bilateral UE and LEs. Coordination: Fine motor coordination was normal.   Tone: No tone or spasticity.   MSK: R knee TTP     Assessment/Plan: 1. Functional deficits which require 3+ hours per day of interdisciplinary therapy in a comprehensive inpatient rehab setting. Physiatrist is providing close team supervision and 24 hour management of active medical problems listed below. Physiatrist and rehab team continue to assess barriers to discharge/monitor patient progress toward functional and medical goals  Care Tool:  Bathing              Bathing assist Assist Level: Minimal Assistance - Patient > 75%     Upper Body Dressing/Undressing Upper body dressing   What is the patient wearing?: Dress    Upper body assist Assist Level: Supervision/Verbal cueing    Lower Body Dressing/Undressing Lower  body dressing      What is the patient wearing?: Incontinence brief     Lower body assist Assist for lower body dressing: Maximal Assistance - Patient 25 - 49%     Toileting Toileting    Toileting assist Assist for toileting: Moderate Assistance - Patient 50 - 74%     Transfers Chair/bed transfer  Transfers assist  Chair/bed transfer activity did not occur: Safety/medical concerns  Chair/bed transfer assist level: Supervision/Verbal cueing     Locomotion Ambulation   Ambulation assist      Assist level:  Supervision/Verbal cueing Assistive device: Walker-rolling Max distance: 150'   Walk 10 feet activity   Assist     Assist level: Supervision/Verbal cueing Assistive device: Walker-rolling   Walk 50 feet activity   Assist Walk 50 feet with 2 turns activity did not occur: Safety/medical concerns (too weak, unsafe)  Assist level: Supervision/Verbal cueing Assistive device: Walker-rolling    Walk 150 feet activity   Assist Walk 150 feet activity did not occur: Safety/medical concerns (too weak, unsafe)  Assist level: Supervision/Verbal cueing Assistive device: Walker-rolling    Walk 10 feet on uneven surface  activity   Assist Walk 10 feet on uneven surfaces activity did not occur: Safety/medical concerns (too weak, unsafe)         Wheelchair     Assist Is the patient using a wheelchair?: Yes Type of Wheelchair: Manual    Wheelchair assist level: Total Assistance - Patient < 25% Max wheelchair distance: >215'    Wheelchair 50 feet with 2 turns activity    Assist        Assist Level: Total Assistance - Patient < 25%   Wheelchair 150 feet activity     Assist      Assist Level: Total Assistance - Patient < 25%   Blood pressure (!) 149/60, pulse 95, temperature 99 F (37.2 C), resp. rate 18, height 5' 3 (1.6 m), weight 126.6 kg, SpO2 96%.  Medical Problem List and Plan: 1. Functional deficits secondary to debility secondary to exacerbation of myasthenia gravis.  Completed 5-day course of IVIG.  Continue Mestinon  30 mg 5 times daily as well as CellCept              -patient may shower             -ELOS/Goals: 7-10 days, supervision PT, sup-min OT, supervision SLP  Con't CIR PT, OT And SLP  Pt feels like something is stuck- asked pt to use nebs prn more often today  -Team conference tomorrow   2.  Antithrombotics: -DVT/anticoagulation:  Mechanical: Antiembolism stockings, thigh (TED hose) Bilateral lower extremities.  Venous Doppler  studies negative 8/21- will start Lovenox  since at high risk for DVT/PE- and on no meds 8/22- moved dose to 60 mg per pharmacy             -antiplatelet therapy: N/A  3. 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 continue Voltaren  gel 4 times daily -prn tylenol   -might benefit from brace with activity 8/20- pt asking to increase due to neuropathy, but explained we just increased Lyrica  8-24: Continue Voltaren  gel to right knee, scheduled Tylenol  1000 mg 3 times daily.  Given persistence of pain, discussed with family possible steroid injection into right knee; patient and family are agreeable, will defer to primary team 8/25 knee brace, consider knee injection   4.  Mood/Behavior/Sleep: Provide emotional support             -antipsychotic agents: Abilify  2 mg daily 5. Neuropsych/cognition: This patient is capable of making decisions on her own behalf. 6. Skin/Wound Care: Routine skin checks 7. Fluids/Electrolytes/Nutrition: Routine in and outs with follow-up chemistries  8.  Acute on chronic respiratory failure with hypoxia and hypercarbia.  Extubated 8/10.  Continue BiPAP.  Continue nebulizers as directed.   -Patient on 2 L O2 nasal cannula prior to admission -keep HOB at 30 degrees 8/20- Pt's CO2 has been high - last checked 8 days prior on BMP- her CO2 is 40 today- 2 points above last check- will recheck in AM- probably need to reduce O2 since CO2 trapping and might need Pulmonary consult/ABG if no better in AM- will write to keep O2 sats at ~ 93-96% and see if can reduce O2 usage. Reached out to nursing.  8/21- d/w PA at length about this- has this chronically- will monitor clinically and make sure no decline in cognition/alertness- if so, will call Pulmonary 8/22- Sounds like louder drawing air- not exactly stridor, but she describes something stuck- asked her to  take prns nebs more frequently today- already added Mucinex /Dextromethorphan   8/23: Satting well, continue current management. Will get CXR for lower lobe rhonchi today.   8-24: Persistent bilateral pleural effusions, some vascular prominence.  Start Lasix  20 mg daily.  Demonstrated ISB with patient and discussed compliance with 10 reps every 2 hours while awake  8/25 Cr stable, continue lasix   9.  Community-acquired pneumonia.  Antibiotic course completed.  Check oxygen  saturations every shift   - stable  11.  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  Dietary follow-up  12.  Hypertension.  Cozaar  50 mg daily.  Monitor with increased mobility  8/22- BP doing ok so far- will monitor   8-24: Artery Lasix  as above.  Monitor for hypotension.  8/25 BP a little labile, monitor trend    09/13/2023    4:42 AM 09/13/2023    4:37 AM 09/12/2023    7:27 PM  Vitals with BMI  Weight 279 lbs 2 oz    BMI 49.45    Systolic  149 158  Diastolic  60 68  Pulse  95 83     13.  Hypothyroidism.  Synthroid  14.  Splenic laceration/hematoma.  Recommendations of supportive care per general surgery  15.  Normocytic anemia.  Follow-up CBC   8/20- Hb 10.7- stable- will con't to monitor  16. Ear wax? Blocking R ear/  8/21- will order Debrox 5 drips BID for R ear x 4 days- see if Sx's improve  17. Globus sensation. Has been occurring since extubation. Provided reassurance, place order for PRN throat lozenges.   LOS: 6 days A FACE TO FACE EVALUATION WAS PERFORMED  Murray Collier 09/13/2023, 11:24 AM

## 2023-09-13 NOTE — Plan of Care (Signed)
  Problem: RH BOWEL ELIMINATION Goal: RH STG MANAGE BOWEL WITH ASSISTANCE Description: STG Manage Bowel with mod I Assistance. Outcome: Progressing   Problem: RH SAFETY Goal: RH STG ADHERE TO SAFETY PRECAUTIONS W/ASSISTANCE/DEVICE Description: STG Adhere to Safety Precautions With cues Assistance/Device. Outcome: Progressing   Problem: RH PAIN MANAGEMENT Goal: RH STG PAIN MANAGED AT OR BELOW PT'S PAIN GOAL Description: Pain < 4 with prns Outcome: Progressing

## 2023-09-13 NOTE — Progress Notes (Signed)
 Occupational Therapy Session Note  Patient Details  Name: [name] MRN: [id number] Date of Birth: 1965-02-22  Today's Date: 09/13/2023 OT Individual Time: 1045-1200 OT Individual Time Calculation (min): 75 min    Short Term Goals: Week 1:  OT Short Term Goal 1 (Week 1): STG=LTG d/t ELOS  Skilled Therapeutic Interventions/Progress Updates:      Therapy Documentation Precautions:  Precautions Precautions: Fall Recall of Precautions/Restrictions: Intact Precaution/Restrictions Comments: oxygen  2L Restrictions Weight Bearing Restrictions Per Provider Order: No General: Pt seated in W/C upon OT arrival, agreeable to OT. Pt on 2L O2 throughout session.  Pain:  10/10 pain reported in Rt knee, activity, intermittent rest breaks, distractions provided for pain management, pt reports tolerable to proceed.   ADL: OT providing skilled intervention on ADL retraining in order to increase independence with tasks and increase activity tolerance. Pt completed the following tasks at the current level of assist: Grooming/oral hygiene: SBA seated at sink in order to put on deodorant  UB dressing: SBA with donning/doffing overhead shirt with increased time LB dressing: CGA with use of reacher with increased time  Footwear: total A with grip socks, able to doff with reacher  Shower transfer: SBA with RW ambulating from room with no LOB Bathing: Min A overall seated on TTB   Pt seated in W/C at end of session with W/C alarm donned, call light within reach and 4Ps assessed.    Therapy/Group: Individual Therapy  Camie Hoe, OTD, OTR/L 09/13/2023, 4:28 PM

## 2023-09-14 DIAGNOSIS — D649 Anemia, unspecified: Secondary | ICD-10-CM

## 2023-09-14 NOTE — Progress Notes (Signed)
 Physical Therapy Session Note  Patient Details  Name: [name] MRN: [id number] Date of Birth: 29-Jul-1965  Today's Date: 09/14/2023 PT Individual Time: 1106-1201 PT Individual Time Calculation (min): 55 min   Short Term Goals: Week 1:  PT Short Term Goal 1 (Week 1): = LTG  Skilled Therapeutic Interventions/Progress Updates:    Pt presents in room in bed, slow to move and initiate but agreeable to PT. Pt notably flat throughout session. Pt denies pain at rest however does report pain in R knee with ambulation, unrated. Session focused on therapeutic activities for upright tolerance, breathing mechanics, and global endurance. Pt SpO2 monitored throughout session on 2L supplemental O2. Pt completes bed mobility slowly with min assist for trunk to upright and scooting towards EOB. Pt completes sit to stand and stand step transfers with RW with supervision. Pt transported to main gym dependently for time management and energy conservation. Pt SpO2 monitored at rest to be 95%, HR 75bpm. Pt completes gait 115' self selected distance with RW close supervision, therapist bringing WC and oxygen  in tow. Pt SpO2 drops to 84% following gait, requires cues for deep breathing with pt oxygen  quickly returning to baseline. Pt then ambulates 2x95' with RW, close supervision with therapist leaving WC, pt requires min assist for managing RW for turning to sitting in WC following gait. Pt cued throughout gait for deep breathing. Pt returns to sitting in WC, SpO2 again drops to 85%, returns even more quickly (within ~15 seconds) to baseline with cues for deep breathing. Pt provided with extended seated rest breaks between all gait trials to promote energy conservation and quality with tasks. Pt returns to room and remains seated in [facility] with all needs within reach, cal light in place, hooked up to wall O2 at 2L at end of session.     Therapy Documentation Precautions:  Precautions Precautions: Fall Recall of  Precautions/Restrictions: Intact Precaution/Restrictions Comments: oxygen  2L Restrictions Weight Bearing Restrictions Per Provider Order: No   Therapy/Group: Individual Therapy  Reche Ohara PT, DPT 09/14/2023, 12:02 PM

## 2023-09-14 NOTE — Progress Notes (Signed)
 Physical Therapy Session Note  Patient Details  Name: [name] MRN: [id number] Date of Birth: [birth year]  Today's Date: 09/14/2023 PT Individual Time: 0830-0915 PT Individual Time Calculation (min): 45 min   Short Term Goals: Week 1:  PT Short Term Goal 1 (Week 1): = LTG  Skilled Therapeutic Interventions/Progress Updates:    Pt sitting @EOB  when PT arrived, pt agreeable for session.  PT provided mod assist with donning pants, right knee brace (total assist).  Pt did not feel comfortable with brace and requesting to remove for session.  Pt amb with RW x105' before needed to rest in w/c due to right knee pain and c/o dizziness; which resolved quickly after rest. PT pulling w/c and oxygen . Pt continued to amb to ortho gym.  Standing UBE x5', rest, seated UBE x3', rest, standing x3'; level 1.  Pt willing to amb back to room with RW without seated rest break (pt stood in place for quick recovery period).  Once in room, pt requesting to get into bed until next session @ 11am.  Pt able to demonstrate stand->sit->supine using bed railings to support upper body, positioning BLE into bed, BUE/BLE assisting to position to [facility]; all supervision.  Pt comfortable in bed, all items within reach.  Pt reports R knee pain is better post session.    Therapy Documentation Precautions:  Precautions Precautions: Fall Recall of Precautions/Restrictions: Intact Precaution/Restrictions Comments: oxygen  2L Restrictions Weight Bearing Restrictions Per Provider Order: No  Pain: Pain Assessment Pain Scale: 0-10 Pain Score: 7  Pain Location: Knee Pain Intervention(s): Medication (See eMAR)   Therapy/Group: Individual Therapy  [name] Fast 09/14/2023, 9:59 AM

## 2023-09-14 NOTE — Plan of Care (Signed)
  Problem: RH BOWEL ELIMINATION Goal: RH STG MANAGE BOWEL WITH ASSISTANCE Description: STG Manage Bowel with mod I Assistance. Outcome: Progressing Goal: RH STG MANAGE BOWEL W/MEDICATION W/ASSISTANCE Description: STG Manage Bowel with Medication with mod I  Assistance. Outcome: Progressing   Problem: RH SAFETY Goal: RH STG ADHERE TO SAFETY PRECAUTIONS W/ASSISTANCE/DEVICE Description: STG Adhere to Safety Precautions With cues Assistance/Device. Outcome: Progressing   Problem: RH PAIN MANAGEMENT Goal: RH STG PAIN MANAGED AT OR BELOW PT'S PAIN GOAL Description: Pain < 4 with prns Outcome: Progressing

## 2023-09-14 NOTE — Patient Care Conference (Signed)
 Inpatient RehabilitationTeam Conference and Plan of Care Update Date: 09/14/2023   Time: 1141 am    Patient Name: [name]      Medical Record Number: [id number]  Date of Birth: 30-Sep-1965 Sex: Female         Room/Bed: 4M11C/4M11C-01 Payor Info: Payor: [employer] MEDICARE / Plan: [employer] MEDICARE HMO / Product Type: *No Product type* /    Admit Date/Time:  09/07/2023  2:15 PM  Primary Diagnosis:  Myasthenia gravis [facility])  Hospital Problems: Principal Problem:   Myasthenia gravis [facility])    Expected Discharge Date: Expected Discharge Date: 09/22/23  Team Members Present: Physician leading conference: Dr. Murray Collier Social Worker Present: Rhoda Clement, LCSW Nurse Present: Eulalio Falls, RN PT Present: Other (comment) Lessie Miso, PT) OT Present: Camie Hoe, OT     Current Status/Progress Goal Weekly Team Focus  Bowel/Bladder      Continent of bowel and bladder with accident at times    Maintain continence of bowel and bladder    Assess bowel and bladder q shift  Swallow/Nutrition/ Hydration               ADL's   CGA-Min A with AD, increased time required, limited by endurance   mod I   endurance, LB ADL strength    Mobility   supervision, CGA with gait, transfers; limited due to right knee pain at time   mod indep with all mobility; except stairs supervision  endurance, strengthening, gait, knee pain    Communication                Safety/Cognition/ Behavioral Observations               Pain      Right knee pain     <4 w/ prns    Assess pain q shift  Skin      No skin issues   Skin free from infection entire stay on rehab    Assess skin q shift    Discharge Planning:  HOme with sister who can assist and has other sister's who will be checking in on her. Will need team's recommendations    Team Discussion: Patient was admitted with debility secondary to exacerbation of myasthenia gravis. Patient with persistence right knee pain/  hypertension: medication adjusted by MD. Patient limited by poor endurance and neuropathy.  Patient on target to meet rehab goals: Currently,  patient needs minimal assistance with ADLs Patient needs supervision/CGA with transfers and ambulation using a rolling walker. Overall goals at discharge are set for supervision assistance.  *See Care Plan and progress notes for long and short-term goals.   Revisions to Treatment Plan:  Ortho consult: steroid injection  Reacher Downgraded goals  Teaching Needs: Safety, medications, transfers, toileting, etc   Current Barriers to Discharge: Decreased caregiver support, Home enviroment access/layout, and oxygen  for home  Possible Resolutions to Barriers: Family Education DME: walker, BSC,TTB,W/C Home health follow up     Medical Summary Current Status: Debility, myasthenia, knee pain, HTN, anemia  Barriers to Discharge: Medical stability;Self-care education;Uncontrolled Pain  Barriers to Discharge Comments: Debility, myasthenia, knee pain, HTN, anemia Possible Resolutions to [employer] Focus: Knee injections, monitor CBC, monitor BP, lasix    Continued Need for Acute Rehabilitation Level of Care: The patient requires daily medical management by a physician with specialized training in physical medicine and rehabilitation for the following reasons: Direction of a multidisciplinary physical rehabilitation program to maximize functional independence : Yes Medical management of patient stability for increased  activity during participation in an intensive rehabilitation regime.: Yes Analysis of laboratory values and/or radiology reports with any subsequent need for medication adjustment and/or medical intervention. : Yes   I attest that I was present, lead the team conference, and concur with the assessment and plan of the team.   Vieva Brummitt Gayo 09/14/2023, 1155 am

## 2023-09-14 NOTE — Progress Notes (Signed)
 PROGRESS NOTE   Subjective/Complaints: R knee pain continued. Planned for knee injection. Knee brace didn't help.  LBM today.  Birthday tomorrow.   ROS: Per HPI Pt denies SOB, abd pain, CP, N/V/C and new vision changes + Throat soreness- improved + Right knee pain-continued  Objective:   No results found.  No results for input(s): WBC, HGB, HCT, PLT in the last 72 hours.  No results for input(s): NA, K, CL, CO2, GLUCOSE, BUN, CREATININE, CALCIUM  in the last 72 hours.   Intake/Output Summary (Last 24 hours) at 09/14/2023 1157 Last data filed at 09/14/2023 0730 Gross per 24 hour  Intake 431 ml  Output --  Net 431 ml        Physical Exam: Vital Signs Blood pressure 125/63, pulse 90, temperature 97.7 F (36.5 C), temperature source Oral, resp. rate 18, height 5' 3 (1.6 m), weight 127.7 kg, SpO2 97%.    General: awake, alert, appropriate, sitting up in bed; wearing O2; NAD HENT: conjugate gaze; oropharynx  a little dry- O2 in place by [place] 2.5 liters O2 CV: regular rate and rhythm- rate in 90's; no JVD Pulmonary: on 2.5 L  - R>L lower low rhonchi.  Unchanged from prior exam. GI: soft, NT, ND, (+)BS- protuberant Psychiatric: appropriate- quiet,  but interactive   Neurologic Exam:   Cog: AAOx3. Normal content, no dysarthria. CN: + R ptosis and exotropia; otherwise normal Babinsky: flexor responses b/l.   Hoffmans: negative b/l Sensory exam: revealed normal sensation in all dermatomal regions in bilateral  UE and LEs. Motor exam: strength normal in all myotomal regions of bilateral UE and LEs. Coordination: Fine motor coordination was normal.   Tone: No tone or spasticity.   Prior neuro assessment is c/w today's exam 09/14/2023.   MSK: R knee TTP     Assessment/Plan: 1. Functional deficits which require 3+ hours per day of interdisciplinary therapy in a comprehensive inpatient rehab  setting. Physiatrist is providing close team supervision and 24 hour management of active medical problems listed below. Physiatrist and rehab team continue to assess barriers to discharge/monitor patient progress toward functional and medical goals  Care Tool:  Bathing              Bathing assist Assist Level: Minimal Assistance - Patient > 75%     Upper Body Dressing/Undressing Upper body dressing   What is the patient wearing?: Dress    Upper body assist Assist Level: Supervision/Verbal cueing    Lower Body Dressing/Undressing Lower body dressing      What is the patient wearing?: Incontinence brief     Lower body assist Assist for lower body dressing: Maximal Assistance - Patient 25 - 49%     Toileting Toileting    Toileting assist Assist for toileting: Moderate Assistance - Patient 50 - 74%     Transfers Chair/bed transfer  Transfers assist  Chair/bed transfer activity did not occur: Safety/medical concerns  Chair/bed transfer assist level: Supervision/Verbal cueing     Locomotion Ambulation   Ambulation assist      Assist level: Supervision/Verbal cueing Assistive device: Walker-rolling Max distance: 215'   Walk 10 feet activity   Assist  Assist level: Supervision/Verbal cueing Assistive device: Walker-rolling   Walk 50 feet activity   Assist Walk 50 feet with 2 turns activity did not occur: Safety/medical concerns (too weak, unsafe)  Assist level: Supervision/Verbal cueing Assistive device: Walker-rolling    Walk 150 feet activity   Assist Walk 150 feet activity did not occur: Safety/medical concerns (too weak, unsafe)  Assist level: Supervision/Verbal cueing Assistive device: Walker-rolling    Walk 10 feet on uneven surface  activity   Assist Walk 10 feet on uneven surfaces activity did not occur: Safety/medical concerns (too weak, unsafe)         Wheelchair     Assist Is the patient using a wheelchair?:  Yes Type of Wheelchair: Manual    Wheelchair assist level: Total Assistance - Patient < 25% Max wheelchair distance: >215'    Wheelchair 50 feet with 2 turns activity    Assist        Assist Level: Total Assistance - Patient < 25%   Wheelchair 150 feet activity     Assist      Assist Level: Total Assistance - Patient < 25%   Blood pressure 125/63, pulse 90, temperature 97.7 F (36.5 C), temperature source Oral, resp. rate 18, height 5' 3 (1.6 m), weight 127.7 kg, SpO2 97%.  Medical Problem List and Plan: 1. Functional deficits secondary to debility secondary to exacerbation of myasthenia gravis.  Completed 5-day course of IVIG.  Continue Mestinon  30 mg 5 times daily as well as CellCept              -patient may shower             -ELOS/Goals: 7-10 days, supervision PT, sup-min OT, supervision SLP  Con't CIR PT, OT And SLP  Pt feels like something is stuck- asked pt to use nebs prn more often today  -Team conference today please see physician documentation under team conference tab, met with team  to discuss problems,progress, and goals. Formulized individual treatment plan based on medical history, underlying problem and comorbidities.    2.  Antithrombotics: -DVT/anticoagulation:  Mechanical: Antiembolism stockings, thigh (TED hose) Bilateral lower extremities.  Venous Doppler studies negative 8/21- will start Lovenox  since at high risk for DVT/PE- and on no meds 8/22- moved dose to 60 mg per pharmacy             -antiplatelet therapy: N/A  3. 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 continue Voltaren  gel 4 times daily -prn tylenol   -might benefit from brace with activity 8/20- pt asking to increase due to neuropathy, but explained we just increased Lyrica  8-24: Continue Voltaren  gel to right knee, scheduled Tylenol  1000 mg 3 times daily.   Given persistence of pain, discussed with family possible steroid injection into right knee; patient and family are agreeable, will defer to primary team 8/25 knee brace, consider knee injection  8/26 Ortho consult for knee injection, appreciate help  4. Mood/Behavior/Sleep: Provide emotional support             -antipsychotic agents: Abilify  2 mg daily 5. Neuropsych/cognition: This patient is capable of making decisions on her own behalf. 6. Skin/Wound Care: Routine skin checks 7. Fluids/Electrolytes/Nutrition: Routine in and outs with follow-up chemistries  8.  Acute on chronic respiratory failure with hypoxia and hypercarbia.  Extubated 8/10.  Continue BiPAP.  Continue nebulizers as directed.   -Patient on 2  L O2 nasal cannula prior to admission -keep HOB at 30 degrees 8/20- Pt's CO2 has been high - last checked 8 days prior on BMP- her CO2 is 40 today- 2 points above last check- will recheck in AM- probably need to reduce O2 since CO2 trapping and might need Pulmonary consult/ABG if no better in AM- will write to keep O2 sats at ~ 93-96% and see if can reduce O2 usage. Reached out to nursing.  8/21- d/w PA at length about this- has this chronically- will monitor clinically and make sure no decline in cognition/alertness- if so, will call Pulmonary 8/22- Sounds like louder drawing air- not exactly stridor, but she describes something stuck- asked her to take prns nebs more frequently today- already added Mucinex /Dextromethorphan   8/23: Satting well, continue current management. Will get CXR for lower lobe rhonchi today.   8-24: Persistent bilateral pleural effusions, some vascular prominence.  Start Lasix  20 mg daily.  Demonstrated ISB with patient and discussed compliance with 10 reps every 2 hours while awake  8/26 recheck labs tomorrow   9.  Community-acquired pneumonia.  Antibiotic course completed.  Check oxygen  saturations every shift   - stable  11.  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   Dietary follow-up  12.  Hypertension.  Cozaar  50 mg daily.  Monitor with increased mobility  8/22- BP doing ok so far- will monitor   8-24: Artery Lasix  as above.  Monitor for hypotension.  8/26 BP stable, continue to follow     09/14/2023    4:56 AM 09/13/2023    8:12 PM 09/13/2023    1:24 PM  Vitals with BMI  Weight 281 lbs 8 oz    BMI 49.88    Systolic 125 115 863  Diastolic 63 60 62  Pulse 90 78 85     13.  Hypothyroidism.  Synthroid  14.  Splenic laceration/hematoma.  Recommendations of supportive care per general surgery  15.  Normocytic anemia.  Follow-up CBC   8/20- Hb 10.7- stable- will con't to monitor  Recheck tomorrow  16. Ear wax? Blocking R ear/  8/21- will order Debrox 5 drips BID for R ear x 4 days- see if Sx's improve  17. Globus sensation. Has been occurring since extubation. Provided reassurance, place order for PRN throat lozenges.   LOS: 7 days A FACE TO FACE EVALUATION WAS PERFORMED  Murray Collier 09/14/2023, 11:57 AM

## 2023-09-14 NOTE — Progress Notes (Signed)
 Patient ID: [name], female   DOB: Sep 04, 1965, 58 y.o.   MRN: [id number]  Met with pt and spoke with [name]-sister via telephone to discuss team conference goals of mod/I level and target discharge date of 9/3. Pt is hopeful her knee will get better with the injection she got this am. She has never had issues with her knee before. Trying to schedule family education [name] to talk with sister's and call this worker back regarding day and time they can come. Aware of stair issue and need to practice with these. Continue to work on discharge needs.

## 2023-09-14 NOTE — Consult Note (Signed)
 Reason for Consult:Right knee pain Referring Physician: Duwaine Barrs Time called: 0900 Time at bedside: 0937   [name] is an 58 y.o. female.  HPI: [name] was admitted to CIR 6d ago. She has had right knee pain that began spontaneously during the hospitalization. No antecedent event noted. She denies prior hx/o knee problems or gout. Orthopedic surgery was asked to give steroid injection to aid in mobilization. She does have moderate OA on x-ray.  Past Medical History:  Diagnosis Date   Asthma    Depression    Heart murmur    Myasthenia gravis (HCC)    Obesity    Pulmonary embolism (HCC)    Sleep apnea    Thyroid  disease     Past Surgical History:  Procedure Laterality Date   BREAST SURGERY  06/2019   breast reduction   THYROID  SURGERY     TONSILLECTOMY     TUBAL LIGATION      History reviewed. No pertinent family history.  Social History:  reports that she has quit smoking. She has never used smokeless tobacco. She reports current alcohol use. She reports that she does not use drugs.  Allergies:  Allergies  Allergen Reactions   Magnesium  Sulfate Other (See Comments)    Myasthenia gravis. Consider withholding replacement unless severely low.     Medications: I have reviewed the patient's current medications.  No results found for this or any previous visit (from the past 48 hours).  No results found.  Review of Systems  HENT:  Negative for ear discharge, ear pain, hearing loss and tinnitus.   Eyes:  Negative for photophobia and pain.  Respiratory:  Negative for cough and shortness of breath.   Cardiovascular:  Negative for chest pain.  Gastrointestinal:  Negative for abdominal pain, nausea and vomiting.  Genitourinary:  Negative for dysuria, flank pain, frequency and urgency.  Musculoskeletal:  Positive for arthralgias (Right knee). Negative for back pain, myalgias and neck pain.  Neurological:  Negative for dizziness and headaches.  Hematological:   Does not bruise/bleed easily.  Psychiatric/Behavioral:  The patient is not nervous/anxious.    Blood pressure 125/63, pulse 90, temperature 97.7 F (36.5 C), temperature source Oral, resp. rate 18, height 5' 3 (1.6 m), weight 127.7 kg, SpO2 97%. Physical Exam Constitutional:      General: She is not in acute distress.    Appearance: She is well-developed. She is not diaphoretic.  HENT:     Head: Normocephalic and atraumatic.  Eyes:     General: No scleral icterus.       Right eye: No discharge.        Left eye: No discharge.     Conjunctiva/sclera: Conjunctivae normal.  Cardiovascular:     Rate and Rhythm: Normal rate and regular rhythm.  Pulmonary:     Effort: Pulmonary effort is normal. No respiratory distress.  Musculoskeletal:     Cervical back: Normal range of motion.     Comments: RLE No traumatic wounds, ecchymosis, or rash  Mild TTP knee  No knee or ankle effusion  Knee stable to varus/ valgus and anterior/posterior stress  Sens DPN, SPN, TN intact  Motor EHL, ext, flex, evers 5/5  DP 2+, PT 2+, No significant edema  Skin:    General: Skin is warm and dry.  Neurological:     Mental Status: She is alert.  Psychiatric:        Mood and Affect: Mood normal.        Behavior: Behavior  normal.     Assessment/Plan: Right knee pain -- Knee injected.    [name], PA-C Orthopedic Surgery [phone] 09/14/2023, 9:55 AM

## 2023-09-14 NOTE — Procedures (Signed)
 Procedure: Right knee aspiration and injection   Indication: Right knee effusion(s)   Surgeon: Ozell Ned, PA-C   Assist: None   Anesthesia: Topical refrigerant   EBL: None   Complications: None   Findings: After risks/benefits explained patient desires to undergo procedure. Consent obtained and time out performed. The right knee was sterilely prepped and aspirated. Approximately 15ml clear yellow fluid aspirated. 6ml 0.5% Marcaine  and 40mg  depomedrol instilled. Pt tolerated the procedure well.       [name], PA-C Orthopedic Surgery [phone]

## 2023-09-14 NOTE — Progress Notes (Signed)
   09/14/23 2349  BiPAP/CPAP/SIPAP  BiPAP/CPAP/SIPAP Pt Type Adult  BiPAP/CPAP/SIPAP DREAMSTATIOND  Mask Type Full face mask  Dentures removed? Not applicable  Mask Size Medium  Set Rate 0 breaths/min  Respiratory Rate 15 breaths/min  IPAP 13 cmH20  EPAP 5 cmH2O  Pressure Support 8 cmH20  PEEP 5 cmH20  FiO2 (%) 28 %  Flow Rate 2 lpm  Patient Home Machine No  Patient Home Mask No  Patient Home Tubing No  Auto Titrate No  CPAP/SIPAP surface wiped down Yes  Device Plugged into RED Power Outlet Yes  Oxygen  Percent 28 %

## 2023-09-14 NOTE — Progress Notes (Signed)
 Occupational Therapy Session Note  Patient Details  Name: [name] MRN: [id number] Date of Birth: 09/07/1965  Today's Date: 09/14/2023 OT Individual Time: 1340-1445 OT Individual Time Calculation (min): 65 min    Short Term Goals: Week 1:  OT Short Term Goal 1 (Week 1): STG=LTG d/t ELOS  Skilled Therapeutic Interventions/Progress Updates:      Therapy Documentation Precautions:  Precautions Precautions: Fall Recall of Precautions/Restrictions: Intact Precaution/Restrictions Comments: oxygen  2L Restrictions Weight Bearing Restrictions Per Provider Order: No General: Pt seated in W/C upon OT arrival, agreeable to OT.  Pain:  7/10 pain reported in BLE feet d/t neuropathy and Rt knee pain, activity, intermittent rest breaks, distractions provided for pain management, pt reports tolerable to proceed.   ADL: OT providing skilled intervention on ADL retraining in order to increase independence with tasks and increase activity tolerance. Pt completed the following tasks at the current level of assist: Toilet transfer: SBA with RW, OT assisting with O2 line management, increased time required  Toileting: SBA, able to stand unsupported and doff/don pants over waist, also able to complete hygiene after urinating  Exercises: Pt completed the following exercise circuit in order to improve functional activity, strength and endurance to prepare for ADLs such as bathing. Pt completed the following exercises in seated position with no noted LOB/SOB and 3x10 repetitions on each exercise with green resistance band, VC provided for initiation and technique: -leg curls -hip extension -hip abduction   Other Treatments: Pt and OT discussed activity levels prior to incident which lead pt to hospital. Pt reported sedentary lifestyle prior, only really walking to bathroom and getting OOB.     Pt seated in W/C at end of session with call light within reach and 4Ps assessed.     Therapy/Group: Individual Therapy  Camie Hoe, OTD, OTR/L 09/14/2023, 2:52 PM

## 2023-09-14 NOTE — Progress Notes (Signed)
 Physical Therapy Session Note  Patient Details  Name: [name] MRN: [id number] Date of Birth: [birth year]  Today's Date: 09/14/2023 PT Individual Time: 1447-1530 PT Individual Time Calculation (min): 43 min   Short Term Goals: Week 1:  PT Short Term Goal 1 (Week 1): = LTG  Skilled Therapeutic Interventions/Progress Updates: Pt presents sitting in w/c and agreeable to therapy.  Pt states pain R knee of 7/10 and rest breaks allowed for pain management during session.  Attempted knee sleeve but kept sliding down.  Pt had knee injection this AM.  Pt wheeled to main gym.  Pt amb w/ RW x 100' and supervision.  O2 sats decreased to 84% on 2 LPM but returned to 94% w/in 1  of sitting and cues for PLB.  Pt negotiated through cone obstacle course w/ supervision, occasional cues for walker management through turns.  Pt returned to room and remained sitting in w/c w/ all needs in reach, sister present.     Therapy Documentation Precautions:  Precautions Precautions: Fall Recall of Precautions/Restrictions: Intact Precaution/Restrictions Comments: oxygen  2L Restrictions Weight Bearing Restrictions Per Provider Order: No General:   Vital Signs: Therapy Vitals Temp: 98.9 F (37.2 C) Temp Source: Oral Pulse Rate: 72 Resp: 18 BP: (!) 116/43 Patient Position (if appropriate): Sitting Oxygen  Therapy SpO2: 99 % O2 Device: Room Air Pain:7/10 Pain Assessment Pain Scale: 0-10 Pain Score: 7  Pain Location: Knee Pain Intervention(s): Medication (See eMAR)   Therapy/Group: Individual Therapy  Yenesis Even P Jennfer Gassen 09/14/2023, 4:21 PM

## 2023-09-15 ENCOUNTER — Inpatient Hospital Stay (HOSPITAL_COMMUNITY)

## 2023-09-15 DIAGNOSIS — D72829 Elevated white blood cell count, unspecified: Secondary | ICD-10-CM

## 2023-09-15 LAB — BASIC METABOLIC PANEL WITH GFR
Anion gap: 9 (ref 5–15)
BUN: 9 mg/dL (ref 6–20)
CO2: 39 mmol/L — ABNORMAL HIGH (ref 22–32)
Calcium: 9.3 mg/dL (ref 8.9–10.3)
Chloride: 92 mmol/L — ABNORMAL LOW (ref 98–111)
Creatinine, Ser: 0.74 mg/dL (ref 0.44–1.00)
GFR, Estimated: >60 mL/min (ref >60)
Glucose, Bld: 111 mg/dL — ABNORMAL HIGH (ref 70–99)
Potassium: 4.6 mmol/L (ref 3.5–5.1)
Sodium: 140 mmol/L (ref 135–145)

## 2023-09-15 LAB — CBC
HCT: 35 % — ABNORMAL LOW (ref 36.0–46.0)
Hemoglobin: 10.7 g/dL — ABNORMAL LOW (ref 12.0–15.0)
MCH: 29.5 pg (ref 26.0–34.0)
MCHC: 30.6 g/dL (ref 30.0–36.0)
MCV: 96.4 fL (ref 80.0–100.0)
Platelets: 197 K/uL (ref 150–400)
RBC: 3.63 MIL/uL — ABNORMAL LOW (ref 3.87–5.11)
RDW: 15.1 % (ref 11.5–15.5)
WBC: 11.3 K/uL — ABNORMAL HIGH (ref 4.0–10.5)
nRBC: 0.6 % — ABNORMAL HIGH (ref 0.0–0.2)

## 2023-09-15 NOTE — Progress Notes (Signed)
 PROGRESS NOTE   Subjective/Complaints: Right knee pain doing a little better today.  She is wearing a knee brace this morning.  [birth year] today!.  No new concerns elicited.  ROS: Per HPI Pt denies SOB, abd pain, CP, N/V/C and new vision changes + Throat soreness- improved + Right knee pain-improved  Objective:   No results found.  Recent Labs    09/15/23 0447  WBC 11.3*  HGB 10.7*  HCT 35.0*  PLT 197    Recent Labs    09/15/23 0447  NA 140  K 4.6  CL 92*  CO2 39*  GLUCOSE 111*  BUN 9  CREATININE 0.74  CALCIUM  9.3     Intake/Output Summary (Last 24 hours) at 09/15/2023 1642 Last data filed at 09/15/2023 1253 Gross per 24 hour  Intake 358 ml  Output --  Net 358 ml        Physical Exam: Vital Signs Blood pressure (!) 125/51, pulse 83, temperature 98.3 F (36.8 C), temperature source Oral, resp. rate 18, height 5' 3 (1.6 m), weight 129.6 kg, SpO2 97%.    General: awake, alert, appropriate, working with therapy in room; wearing O2; NAD HENT: conjugate gaze; oropharynx moist O2 in place by [place] 2liters O2 CV: regular rate and rhythm- rate in 90's; no JVD Pulmonary:  R>L lower low rhonchi.  Unchanged from prior exam. GI: soft, NT, ND, (+)BS- protuberant Psychiatric: appropriate- quiet,  but interactive   Neurologic Exam:   Cog: AAOx3. Normal content, no dysarthria. CN: + R ptosis and exotropia; otherwise normal Babinsky: flexor responses b/l.   Hoffmans: negative b/l Sensory exam: revealed normal sensation in all dermatomal regions in bilateral  UE and LEs. Motor exam: strength normal in all myotomal regions of bilateral UE and LEs. Coordination: Fine motor coordination was normal.   Tone: No tone or spasticity.   Prior neuro assessment is c/w today's exam 09/15/2023.   MSK: Wearing knee brace right knee    Assessment/Plan: 1. Functional deficits which require 3+ hours per day of  interdisciplinary therapy in a comprehensive inpatient rehab setting. Physiatrist is providing close team supervision and 24 hour management of active medical problems listed below. Physiatrist and rehab team continue to assess barriers to discharge/monitor patient progress toward functional and medical goals  Care Tool:  Bathing              Bathing assist Assist Level: Minimal Assistance - Patient > 75%     Upper Body Dressing/Undressing Upper body dressing   What is the patient wearing?: Dress    Upper body assist Assist Level: Supervision/Verbal cueing    Lower Body Dressing/Undressing Lower body dressing      What is the patient wearing?: Incontinence brief     Lower body assist Assist for lower body dressing: Maximal Assistance - Patient 25 - 49%     Toileting Toileting    Toileting assist Assist for toileting: Moderate Assistance - Patient 50 - 74%     Transfers Chair/bed transfer  Transfers assist  Chair/bed transfer activity did not occur: Safety/medical concerns  Chair/bed transfer assist level: Supervision/Verbal cueing     Locomotion Ambulation   Ambulation assist  Assist level: Supervision/Verbal cueing Assistive device: Walker-rolling Max distance: 100   Walk 10 feet activity   Assist     Assist level: Supervision/Verbal cueing Assistive device: Walker-rolling   Walk 50 feet activity   Assist Walk 50 feet with 2 turns activity did not occur: Safety/medical concerns (too weak, unsafe)  Assist level: Supervision/Verbal cueing Assistive device: Walker-rolling    Walk 150 feet activity   Assist Walk 150 feet activity did not occur: Safety/medical concerns (too weak, unsafe)  Assist level: Supervision/Verbal cueing Assistive device: Walker-rolling    Walk 10 feet on uneven surface  activity   Assist Walk 10 feet on uneven surfaces activity did not occur: Safety/medical concerns (too weak, unsafe)          Wheelchair     Assist Is the patient using a wheelchair?: Yes Type of Wheelchair: Manual    Wheelchair assist level: Total Assistance - Patient < 25% Max wheelchair distance: >215'    Wheelchair 50 feet with 2 turns activity    Assist        Assist Level: Total Assistance - Patient < 25%   Wheelchair 150 feet activity     Assist      Assist Level: Total Assistance - Patient < 25%   Blood pressure (!) 125/51, pulse 83, temperature 98.3 F (36.8 C), temperature source Oral, resp. rate 18, height 5' 3 (1.6 m), weight 129.6 kg, SpO2 97%.  Medical Problem List and Plan: 1. Functional deficits secondary to debility secondary to exacerbation of myasthenia gravis.  Completed 5-day course of IVIG.  Continue Mestinon  30 mg 5 times daily as well as CellCept              -patient may shower             -ELOS/Goals: 7-10 days, supervision PT, sup-min OT, supervision SLP  Con't CIR PT, OT And SLP  Pt feels like something is stuck- asked pt to use nebs prn more often today  - Expected discharge date 9/3   2.  Antithrombotics: -DVT/anticoagulation:  Mechanical: Antiembolism stockings, thigh (TED hose) Bilateral lower extremities.  Venous Doppler studies negative 8/21- will start Lovenox  since at high risk for DVT/PE- and on no meds 8/22- moved dose to 60 mg per pharmacy             -antiplatelet therapy: N/A  3. 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 continue Voltaren  gel 4 times daily -prn tylenol   -might benefit from brace with activity 8/20- pt asking to increase due to neuropathy, but explained we just increased Lyrica  8-24: Continue Voltaren  gel to right knee, scheduled Tylenol  1000 mg 3 times daily.  Given persistence of pain, discussed with family possible steroid injection into right knee; patient and family are agreeable, will defer to  primary team 8/25 knee brace, consider knee injection  8/26 Ortho consult for knee injection, appreciate help  4. Mood/Behavior/Sleep: Provide emotional support             -antipsychotic agents: Abilify  2 mg daily 5. Neuropsych/cognition: This patient is capable of making decisions on her own behalf. 6. Skin/Wound Care: Routine skin checks 7. Fluids/Electrolytes/Nutrition: Routine in and outs with follow-up chemistries  8.  Acute on chronic respiratory failure with hypoxia and hypercarbia.  Extubated 8/10.  Continue BiPAP.  Continue nebulizers as directed.   -Patient on 2 L O2 nasal cannula  prior to admission -keep HOB at 30 degrees 8/20- Pt's CO2 has been high - last checked 8 days prior on BMP- her CO2 is 40 today- 2 points above last check- will recheck in AM- probably need to reduce O2 since CO2 trapping and might need Pulmonary consult/ABG if no better in AM- will write to keep O2 sats at ~ 93-96% and see if can reduce O2 usage. Reached out to nursing.  8/21- d/w PA at length about this- has this chronically- will monitor clinically and make sure no decline in cognition/alertness- if so, will call Pulmonary 8/22- Sounds like louder drawing air- not exactly stridor, but she describes something stuck- asked her to take prns nebs more frequently today- already added Mucinex /Dextromethorphan   8/23: Satting well, continue current management. Will get CXR for lower lobe rhonchi today.   8-24: Persistent bilateral pleural effusions, some vascular prominence.  Start Lasix  20 mg daily.  Demonstrated ISB with patient and discussed compliance with 10 reps every 2 hours while awake  8/27 CXR ordered   9.  Community-acquired pneumonia.  Antibiotic course completed.  Check oxygen  saturations every shift   - stable  11.  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  Dietary follow-up  12.  Hypertension.  Cozaar  50 mg daily.  Monitor with increased mobility  8/22- BP doing ok so far- will monitor   8-24: Artery  Lasix  as above.  Monitor for hypotension.  8/26-27 BP stable, continue to follow     09/15/2023    2:07 PM 09/15/2023    5:16 AM 09/14/2023    7:42 PM  Vitals with BMI  Weight  285 lbs 11 oz   BMI  50.63   Systolic 125 121 863  Diastolic 51 54 60  Pulse 83 73 84     13.  Hypothyroidism.  Synthroid  14.  Splenic laceration/hematoma.  Recommendations of supportive care per general surgery  15.  Normocytic anemia.  Follow-up CBC   8/27 stable HGB at 10.7  16. Ear wax? Blocking R ear/  8/21- will order Debrox 5 drips BID for R ear x 4 days- see if Sx's improve  17. Globus sensation. Has been occurring since extubation. Provided reassurance, place order for PRN throat lozenges.  18. Leukocytosis.  -8/27 No fevers or other signs of infection noted, Recheck CXR. Recheck tomorrow.    LOS: 8 days A FACE TO FACE EVALUATION WAS PERFORMED  Murray Collier 09/15/2023, 4:42 PM

## 2023-09-15 NOTE — Progress Notes (Signed)
 Physical Therapy Session Note  Patient Details  Name: [name] MRN: [id number] Date of Birth: 1965/03/08  Today's Date: 09/15/2023 PT Individual Time: 1005-1105 PT Individual Time Calculation (min): 60 min   Short Term Goals: Week 1:  PT Short Term Goal 1 (Week 1): = LTG  Skilled Therapeutic Interventions/Progress Updates: Pt presented sitting EOB agreeable to therapy. Pt states some unrated pain in R knee. Starting SpO2 99% 2L supplemental O2. Pt stood with CGA and ambulated ~155ft with RW and w/c follow with CGA. Seated rest break SpO2 86% taking approx to resolved >90%. Pt then completed ambulation to ortho gym with finishing SpO2 89% with recovery within 30 sec to >90%. Pt then participated in toe taps to 6in step for increased hip flexor recruitment 2 x 10 bilaterally. Pt also performed ball taps with 2lb dowel 2 x 15 for general conditioning. Pt also performed hamstring pulls with green theraband 2 x 10 and seated rows with 2lb dowel and green theraband 2 x 10. Pt then ambulated ~46ft back towards room with SpO2 92% when seated and requesting to transport remaining distance back to room due to fatigue. Pt left in w/c per pt request at end of session with call bell within reach and needs met.      Therapy Documentation Precautions:  Precautions Precautions: Fall Recall of Precautions/Restrictions: Intact Precaution/Restrictions Comments: oxygen  2L Restrictions Weight Bearing Restrictions Per Provider Order: No General:   Vital Signs:   Pain: Pain Assessment Pain Scale: 0-10 Pain Score: 5  Pain Location: Knee Pain Intervention(s): Medication (See eMAR)     Therapy/Group: Individual Therapy  Shaqueta Casady 09/15/2023, 12:06 PM

## 2023-09-15 NOTE — Progress Notes (Signed)
   09/15/23 2200  BiPAP/CPAP/SIPAP  $ Face Mask Medium Yes  BiPAP/CPAP/SIPAP Pt Type Adult  BiPAP/CPAP/SIPAP DREAMSTATIOND  Mask Type Full face mask  Dentures removed? Not applicable  Mask Size Medium  Set Rate 0 breaths/min  Respiratory Rate 15 breaths/min  IPAP 13 cmH20  EPAP 5 cmH2O  Pressure Support 5 cmH20  PEEP 5 cmH20  FiO2 (%) 28 %  Flow Rate 2 lpm  Minute Ventilation 9.9  Leak 16  Peak Inspiratory Pressure (PIP) 16  Tidal Volume (Vt) 375  Patient Home Machine No  Patient Home Mask No  Patient Home Tubing No  Auto Titrate No  Minimum cmH2O 6 cmH2O  Maximum cmH2O 18 cmH2O  Press High Alarm 25 cmH2O  Press Low Alarm 2 cmH2O  Nasal massage performed Yes  CPAP/SIPAP surface wiped down Yes  Device Plugged into RED Power Outlet Yes  Oxygen  Percent 28 %

## 2023-09-15 NOTE — Progress Notes (Signed)
 Physical Therapy Session Note  Patient Details  Name: [name] MRN: [id number] Date of Birth: December 31, 1965  Today's Date: 09/15/2023 PT Individual Time: 0827-0930 PT Individual Time Calculation (min): 63 min   Short Term Goals: Week 1:  PT Short Term Goal 1 (Week 1): = LTG  Skilled Therapeutic Interventions/Progress Updates:    Pt in asleep in bed when PT arrived, pt agreeable for session.  Pt able to transfer supine->sit with CGA, pt amb with RW  to use commode, transferred on/off commode with supervision, required assist for pericare (providing wash clothes only), mod assist donning briefs with pt standing with RW and using x1 hand to pull clothing up.  PT min assist with dressing both lower/upper body sitting @ EOB.  Total assist to donn right knee brace.  Pt amb to main gym with w/c and oxygen  following. Pt fatigued once in gym, SPO2 90% ON 2L/min via [place]. Quickly recovered to 96%.   Pt performed stair negotiation with x2 railing and CGA, cueing for proper sequencing x14 steps total. Pt demonstrates step to pattern. Very fatigued after activity; 8/10 RPE, requiring extended break, SPO2 92%.  Pt willing to amb back to room with RW, supervision.  Pt demonstrates increased cadence, step length and limited right knee pain during session.  Pt sat up @ EOB post tx, all items within reach.   Therapy Documentation Precautions:  Precautions Precautions: Fall Recall of Precautions/Restrictions: Intact Precaution/Restrictions Comments: oxygen  2L Restrictions Weight Bearing Restrictions Per Provider Order: No    Pain: Pain Assessment Pain Scale: 0-10 Pain Score: 3  Pain Location: Knee Pain Intervention(s): Medication (See eMAR)      Therapy/Group: Individual Therapy  [name] Fast 09/15/2023, 10:38 AM

## 2023-09-15 NOTE — Plan of Care (Signed)
  Problem: RH BOWEL ELIMINATION Goal: RH STG MANAGE BOWEL WITH ASSISTANCE Description: STG Manage Bowel with mod I Assistance. Outcome: Progressing   Problem: RH SAFETY Goal: RH STG ADHERE TO SAFETY PRECAUTIONS W/ASSISTANCE/DEVICE Description: STG Adhere to Safety Precautions With cues Assistance/Device. Outcome: Progressing   Problem: RH PAIN MANAGEMENT Goal: RH STG PAIN MANAGED AT OR BELOW PT'S PAIN GOAL Description: Pain < 4 with prns Outcome: Progressing

## 2023-09-15 NOTE — Progress Notes (Signed)
 Occupational Therapy Session Note  Patient Details  Name: [name] MRN: [id number] Date of Birth: Aug 29, 1965  Today's Date: 09/15/2023 OT Individual Time: 8694-8584 OT Individual Time Calculation (min): 70 min    Short Term Goals: Week 1:  OT Short Term Goal 1 (Week 1): STG=LTG d/t ELOS  Skilled Therapeutic Interventions/Progress Updates:  Skilled OT intervention completed with focus on functional transfers, self-care, DC planning and O2 management education. Pt received seated in w/c, agreeable to session. No pain reported.  Pt received on 2L supplemental O2 via [place]; O2 sat 92%. Declined self-care needs. Transported dependently in w/c <> gym.   CGA sit > stand and stand pivot with RW > EOM. Pt reports baseline use of supplemental O2, however unable to recall what O2 sat should be, and reports not having pulse ox. Allowed pt to utilize pulse ox to apply onto self, but required mod cues for technique and to turn out. Able to read the numbers, but unable to differentiate between oxygen  sat and HR in bpm. Education provided on each, the purpose, and what each means. To improve health literacy and pt autonomy, OT wrote out details on handout for visual handout. Discussed scenarios with pt's of when O2 is at a certain level, what the course of action should be I.e. pursed lip breathing, increasing oxygen , repositioning etc.   Pt reported urge to void. CGA stand pivot with RW > w/c. Back in room, pt opted to doff oxygen  for transfer to toilet. Able to ambulate with CGA and RW > BSC over toilet. Supervision for all toileting steps of continent urinary void. Ambulated back to EOB. Had pt donn pulse ox with cues needed for turning device on. Pt able to then accurately read O2 reading of 72% sat, but unable to report accurate intervention method. I.e. response was to rest/breathe however discussed that pt being chronic oxygen  user, if at a level that low, O2 needs to first be donned. Handout  issued to pt on where to purchase pulse ox on amaz[employer].  Pt remained seated EOB, with bed alarm on/activated, and with all needs in reach at end of session.   Therapy Documentation Precautions:  Precautions Precautions: Fall Recall of Precautions/Restrictions: Intact Precaution/Restrictions Comments: oxygen  2L Restrictions Weight Bearing Restrictions Per Provider Order: No    Therapy/Group: Individual Therapy  Lorrayne FORBES Fritter, MS, OTR/L  09/15/2023, 3:52 PM

## 2023-09-16 LAB — CBC
HCT: 33.5 % — ABNORMAL LOW (ref 36.0–46.0)
Hemoglobin: 10.1 g/dL — ABNORMAL LOW (ref 12.0–15.0)
MCH: 29.4 pg (ref 26.0–34.0)
MCHC: 30.1 g/dL (ref 30.0–36.0)
MCV: 97.4 fL (ref 80.0–100.0)
Platelets: 168 K/uL (ref 150–400)
RBC: 3.44 MIL/uL — ABNORMAL LOW (ref 3.87–5.11)
RDW: 15.7 % — ABNORMAL HIGH (ref 11.5–15.5)
WBC: 8.8 K/uL (ref 4.0–10.5)
nRBC: 0.3 % — ABNORMAL HIGH (ref 0.0–0.2)

## 2023-09-16 LAB — BASIC METABOLIC PANEL WITH GFR
Anion gap: 6 (ref 5–15)
BUN: 8 mg/dL (ref 6–20)
CO2: 39 mmol/L — ABNORMAL HIGH (ref 22–32)
Calcium: 9 mg/dL (ref 8.9–10.3)
Chloride: 96 mmol/L — ABNORMAL LOW (ref 98–111)
Creatinine, Ser: 0.64 mg/dL (ref 0.44–1.00)
GFR, Estimated: >60 mL/min (ref >60)
Glucose, Bld: 93 mg/dL (ref 70–99)
Potassium: 3.9 mmol/L (ref 3.5–5.1)
Sodium: 141 mmol/L (ref 135–145)

## 2023-09-16 NOTE — Progress Notes (Signed)
 PROGRESS NOTE   Subjective/Complaints: Patient reports knee pain is doing better today.  No new concerns or complaints elicited.  ROS: Per HPI Pt denies SOB, abd pain, CP, N/V/C and new vision changes + Throat soreness- improved + Right knee pain-much improved today  Objective:   DG Chest 2 View Result Date: 09/15/2023 CLINICAL DATA:  Leukocytosis. EXAM: CHEST - 2 VIEW COMPARISON:  Chest radiograph dated 09/11/2023. FINDINGS: There is cardiomegaly with vascular congestion and edema. Small left pleural effusion and left lung base atelectasis or infiltrate. No pneumothorax. Median sternotomy wires. No acute osseous pathology. IMPRESSION: 1. Cardiomegaly with vascular congestion and edema. 2. Small left pleural effusion and left lung base atelectasis or infiltrate. Electronically Signed   By: Vanetta Chou M.D.   On: 09/15/2023 19:49    Recent Labs    09/15/23 0447 09/16/23 0519  WBC 11.3* 8.8  HGB 10.7* 10.1*  HCT 35.0* 33.5*  PLT 197 168    Recent Labs    09/15/23 0447 09/16/23 0519  NA 140 141  K 4.6 3.9  CL 92* 96*  CO2 39* 39*  GLUCOSE 111* 93  BUN 9 8  CREATININE 0.74 0.64  CALCIUM  9.3 9.0     Intake/Output Summary (Last 24 hours) at 09/16/2023 1806 Last data filed at 09/16/2023 1329 Gross per 24 hour  Intake 476 ml  Output --  Net 476 ml        Physical Exam: Vital Signs Blood pressure (!) 126/48, pulse 73, temperature 98.2 F (36.8 C), temperature source Oral, resp. rate 18, height 5' 3 (1.6 m), weight 128 kg, SpO2 97%.    General: awake, alert, appropriate, working with therapy, appears comfortable HENT: oropharynx moist, O2 in place 2 L nasal cannula CV: RRR, heart rate in the 70s Pulmonary:  R>L lower low rhonchi.  Unchanged from prior exam. GI: soft, NT, ND, (+)BS- protuberant Psychiatric: appropriate-cooperative, appropriate   Neurologic Exam:   Cog: AAOx3. Normal content, no  dysarthria. CN: + R ptosis and exotropia; otherwise normal Sensory exam: revealed normal sensation in all dermatomal regions in bilateral  UE and LEs. Motor exam: strength normal in all myotomal regions of bilateral UE and LEs. No abnormal tone noted  Prior neuro assessment is c/w today's exam 09/16/2023.   MSK: Wearing knee brace right knee    Assessment/Plan: 1. Functional deficits which require 3+ hours per day of interdisciplinary therapy in a comprehensive inpatient rehab setting. Physiatrist is providing close team supervision and 24 hour management of active medical problems listed below. Physiatrist and rehab team continue to assess barriers to discharge/monitor patient progress toward functional and medical goals  Care Tool:  Bathing              Bathing assist Assist Level: Minimal Assistance - Patient > 75%     Upper Body Dressing/Undressing Upper body dressing   What is the patient wearing?: Dress    Upper body assist Assist Level: Supervision/Verbal cueing    Lower Body Dressing/Undressing Lower body dressing      What is the patient wearing?: Incontinence brief     Lower body assist Assist for lower body dressing: Maximal Assistance - Patient 25 -  49%     Toileting Toileting    Toileting assist Assist for toileting: Moderate Assistance - Patient 50 - 74%     Transfers Chair/bed transfer  Transfers assist  Chair/bed transfer activity did not occur: Safety/medical concerns  Chair/bed transfer assist level: Supervision/Verbal cueing     Locomotion Ambulation   Ambulation assist      Assist level: Supervision/Verbal cueing Assistive device: Walker-rolling Max distance: 100   Walk 10 feet activity   Assist     Assist level: Supervision/Verbal cueing Assistive device: Walker-rolling   Walk 50 feet activity   Assist Walk 50 feet with 2 turns activity did not occur: Safety/medical concerns (too weak, unsafe)  Assist level:  Supervision/Verbal cueing Assistive device: Walker-rolling    Walk 150 feet activity   Assist Walk 150 feet activity did not occur: Safety/medical concerns (too weak, unsafe)  Assist level: Supervision/Verbal cueing Assistive device: Walker-rolling    Walk 10 feet on uneven surface  activity   Assist Walk 10 feet on uneven surfaces activity did not occur: Safety/medical concerns (too weak, unsafe)         Wheelchair     Assist Is the patient using a wheelchair?: Yes Type of Wheelchair: Manual    Wheelchair assist level: Total Assistance - Patient < 25% Max wheelchair distance: >215'    Wheelchair 50 feet with 2 turns activity    Assist        Assist Level: Total Assistance - Patient < 25%   Wheelchair 150 feet activity     Assist      Assist Level: Total Assistance - Patient < 25%   Blood pressure (!) 126/48, pulse 73, temperature 98.2 F (36.8 C), temperature source Oral, resp. rate 18, height 5' 3 (1.6 m), weight 128 kg, SpO2 97%.  Medical Problem List and Plan: 1. Functional deficits secondary to debility secondary to exacerbation of myasthenia gravis.  Completed 5-day course of IVIG.  Continue Mestinon  30 mg 5 times daily as well as CellCept              -patient may shower             -ELOS/Goals: 7-10 days, supervision PT, sup-min OT, supervision SLP  Con't CIR PT, OT And SLP  Pt feels like something is stuck- asked pt to use nebs prn more often today  - Expected discharge date 9/3   2.  Antithrombotics: -DVT/anticoagulation:  Mechanical: Antiembolism stockings, thigh (TED hose) Bilateral lower extremities.  Venous Doppler studies negative 8/21- will start Lovenox  since at high risk for DVT/PE- and on no meds 8/22- moved dose to 60 mg per pharmacy             -antiplatelet therapy: N/A  3. 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 continue Voltaren  gel 4 times daily -prn tylenol   -might benefit from brace with activity 8/20- pt asking to increase due to neuropathy, but explained we just increased Lyrica  8-24: Continue Voltaren  gel to right knee, scheduled Tylenol  1000 mg 3 times daily.  Given persistence of pain, discussed with family possible steroid injection into right knee; patient and family are agreeable, will defer to primary team 8/25 knee brace, consider knee injection  8/26 Ortho consult for knee injection, appreciate help 8/28 knee pain continues to improve, continue to monitor  4. Mood/Behavior/Sleep: Provide emotional support             -  antipsychotic agents: Abilify  2 mg daily 5. Neuropsych/cognition: This patient is capable of making decisions on her own behalf. 6. Skin/Wound Care: Routine skin checks 7. Fluids/Electrolytes/Nutrition: Routine in and outs with follow-up chemistries  8.  Acute on chronic respiratory failure with hypoxia and hypercarbia.  Extubated 8/10.  Continue BiPAP.  Continue nebulizers as directed.   -Patient on 2 L O2 nasal cannula prior to admission -keep HOB at 30 degrees 8/20- Pt's CO2 has been high - last checked 8 days prior on BMP- her CO2 is 40 today- 2 points above last check- will recheck in AM- probably need to reduce O2 since CO2 trapping and might need Pulmonary consult/ABG if no better in AM- will write to keep O2 sats at ~ 93-96% and see if can reduce O2 usage. Reached out to nursing.  8/21- d/w PA at length about this- has this chronically- will monitor clinically and make sure no decline in cognition/alertness- if so, will call Pulmonary 8/22- Sounds like louder drawing air- not exactly stridor, but she describes something stuck- asked her to take prns nebs more frequently today- already added Mucinex /Dextromethorphan   8/23: Satting well, continue current management. Will get CXR for lower lobe rhonchi today.   8-24: Persistent bilateral  pleural effusions, some vascular prominence.  Start Lasix  20 mg daily.  Demonstrated ISB with patient and discussed compliance with 10 reps every 2 hours while awake  8/27 CXR ordered -cardiomegaly with vascular congestion/edema, small left pleural effusion with left lung base atelectasis or infiltrate.  Patient overall stable on 2 L [place], creatinine stable continue current Lasix  dose  9.  Community-acquired pneumonia.  Antibiotic course completed.  Check oxygen  saturations every shift   - stable  11.  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  Dietary follow-up  12.  Hypertension.  Cozaar  50 mg daily.  Monitor with increased mobility  8/22- BP doing ok so far- will monitor   8-24: Artery Lasix  as above.  Monitor for hypotension.  8/28 diastolic BP a little soft but overall stable, she is not currently on losartan     09/16/2023    1:01 PM 09/16/2023    5:48 AM 09/16/2023    5:42 AM  Vitals with BMI  Weight   282 lbs 3 oz  BMI   50  Systolic 126 120   Diastolic 48 48   Pulse 73 83      13.  Hypothyroidism.  Synthroid  14.  Splenic laceration/hematoma.  Recommendations of supportive care per general surgery  15.  Normocytic anemia.  Follow-up CBC   8/28 stable at 10.1 hemoglobin  16. Ear wax? Blocking R ear/  8/21- will order Debrox 5 drips BID for R ear x 4 days- see if Sx's improve  17. Globus sensation. Has been occurring since extubation. Provided reassurance, place order for PRN throat lozenges.  18. Leukocytosis.  -8/27 No fevers or other signs of infection noted, Recheck CXR. Recheck tomorrow. -8/28 WBC improved to 8.8 today, monitor for signs of infection    LOS: 9 days A FACE TO FACE EVALUATION WAS PERFORMED  Murray Collier 09/16/2023, 6:06 PM

## 2023-09-16 NOTE — Evaluation (Signed)
 Recreational Therapy Assessment and Plan  Patient Details  Name: [name] MRN: [id number] Date of Birth: September 01, 1965 Today's Date: 09/16/2023  Rehab Potential: Good ELOS:   d/c 9/4  Assessment Hospital Problem: Principal Problem:   Myasthenia gravis [facility])     Past Medical History:      Past Medical History:  Diagnosis Date   Asthma     Depression     Heart murmur     Myasthenia gravis (HCC)     Obesity     Pulmonary embolism (HCC)     Sleep apnea     Thyroid  disease          Past Surgical History:       Past Surgical History:  Procedure Laterality Date   BREAST SURGERY   06/2019    breast reduction   THYROID  SURGERY       TONSILLECTOMY       TUBAL LIGATION              Assessment & Plan Clinical Impression: [name] is a 58 year old right-handed female with history significant for myasthenia gravis, moderate persistent asthma, chronic hypoxic and hypercarbic respiratory failure on 2 L oxygen  nasal cannula,, OSA on CPAP, history of pulmonary emboli no longer on anticoagulation, hypertension, hypothyroidism, depression, 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 Per chart review patient lives with sister. 1 level home. Modified independent for mobility and sister assist with ADLs. Patient does report a fall 2-3 months ago. Sister works during the day 8-5 and numerous other sisters close by with good support. Presented 08/18/2023 with increasing shortness of breath/worse with exertion. She reported 2 days of new cough productive of dark brown sputum. Admission chemistries unremarkable except glucose 109, AST 59, WBC 16,200, troponin 130-123, BNP 105, blood culture no growth to date, urinalysis negative nitrite. Chest x-ray cardiac enlargement with developing pulmonary vascular congestion suggestion of mild interstitial edema. CT angio of the chest no definite evidence of PE. CT abdomen pelvis indeterminant heterogeneous enhancement of the spleen with mild enlargement  when compared to prior. Splenic laceration and subcapsular fluid collections were suggested. CTA angiogram of the abdomen pelvis showed markedly abnormal appearance of the spleen with apparent splenic laceration in the midpole region. Surrounding subcapsular hematoma. No free fluid in the abdomen or pelvis. No active extravasation of contrast. Bilateral lower lobe airspace opacities, left greater than right concerning for pneumonia. She was started on ceftriaxone /doxycycline  as well as IV fluids. Hospital course complicated by worsening mental status and respiratory failure with evidence of myasthenia gravis exacerbation. Neurology consulted and patient was eventually transferred to the ICU for intubation and airway protection and mechanical ventilation, treated with 5 days of IVIG and pyridostigmine , and extubated 8-10. She was offered Plex therapy by neurology but refused. Hospital course complicated by community-acquired pneumonia completing antibiotic course as well as bouts of hypernatremia felt to be secondary to poor oral intake improved 141. Follow-up general surgery in regards to splenic laceration/hematoma recommending supportive care and hemoglobin remained stable at 10.7. Patient with persistent right knee pain x-rays unremarkable conservative care provided. Therapy evaluations completed due to patient decreased functional mobility was admitted for a comprehensive rehab program. .  Patient transferred to CIR on 09/07/2023.     Pt presents with decreased activity tolerance, decreased functional mobility, decreased balance, feelings of stress Limiting pt's independence with leisure/community pursuits.  Met with pt today to discuss TR services including leisure education, activity analysis/modifications and stress management.  Also discussed the importance of  social, emotional, spiritual health in addition to physical health and their effects on overall health and wellness.  Pt stated  understanding.   Leisure History/Participation Premorbid leisure interest/current participation: Games - Word-search;Community - Travel (Comment);Community - Other (Comment) (like to go to the park but unable) Expression Interests: Music (Comment);Singing;Dance Other Leisure Interests: Television;Reading;Housework Leisure Participation Style: Alone Biochemist, clinical Resources: Good-identify 3 post discharge leisure resources Psychosocial / Spiritual Patient agreeable to Pet Therapy: No Does patient have pets?: No Social interaction - Mood/Behavior: Cooperative Film/video editor for Education?: Yes Recreational Therapy Orientation Orientation -Reviewed with patient: Available activity resources Strengths/Weaknesses Patient Strengths/Abilities: Willingness to participate Patient weaknesses: Physical limitations;Minimal Premorbid Leisure Activity TR Patient demonstrates impairments in the following area(s): Endurance;Motor;Pain TR Additional Impairment(s): Leisure Awareness  Plan Rec Therapy Plan Is patient appropriate for Therapeutic Recreation?: Yes Rehab Potential: Good Treatment times per week: Min 1 session >20 minutes during LOS  Recommendations for other services: None   Discharge Criteria: Patient will be discharged from TR if patient refuses treatment 3 consecutive times without medical reason.  If treatment goals not met, if there is a change in medical status, if patient makes no progress towards goals or if patient is discharged from hospital.  The above assessment, treatment plan, treatment alternatives and goals were discussed and mutually agreed upon: by patient  Marshella Tello 09/16/2023, 12:48 PM

## 2023-09-16 NOTE — Progress Notes (Addendum)
 Physical Therapy Session Note  Patient Details  Name: [name] MRN: [id number] Date of Birth: September 08, 1965  Today's Date: 09/16/2023 PT Individual Time: [phone] and 1125-1210 PT Individual Time Calculation (min): 60 min and 45 min  Short Term Goals: Week 1:  PT Short Term Goal 1 (Week 1): = LTG  Skilled Therapeutic Interventions/Progress Updates: Pt presented sitting EOB with nsg present agreeable to therapy. Pt denies pain at rest, states did not sleep well NOC due to poor fitting CPAP. Pt required minA for threading pants, close supervision to stand and minA to pull pants over hips. Pt returned to sitting, doffed nightgown and donned shirt with supervision. Completed ambulatory transfer to w/c with RW and close supervision. Pt transported to day room for energy conservation and ambulated ~59ft with RW and close supervision, SpO2 94% after ambulation. Participated in LAQ with 3lb weight 2 x 10 and seated hip flexion with 3lb weight 2 x 10. Pt then worked on ambulation for increased endurance. Pt ambulated ~191ft with RW and supervision. SpO2 85% with resolution to >90% within 30 sec. Pt indicating 3/10 on mBORG after ambulation. Pt then ambulated and additional 148ft. Pt transported remaining distance and remained in w/c at end of session. Pt left in w/c with call bell within reach and needs met.   Tx2: Pt presented in w/c agreeable to therapy. Pt denies pain at start of session. Session focused on BLE strengthening with use of stairs. Pt transported to main gym for energy conservation. PTA demonstrated ascending stairs backwards with use of RW. Disucussed decreased safety when holding on to sister's shoulders and the idea that use of RW would provide more stability. Pt then moved over to 3in steps and completed x5 step ups unilaterally R then L. Pt then ascended/descending x 8 3in steps with use of B rail ad CGA. SpO2 desat to 84% after full set of stairs but recovered >90% within 45 sec. Pt  then moved over to 6in step and performed step ups x 3 leading with LLE. By third bout noted significant use of BUE vs LLE. SpO2 84% requiring ~1 min for recovery. Pt then ambulated back towards room requiring seated rest at nursing station approx 145ft. Pt transported remaining distance back to room and remained in w/c. Pt left with call bell within reach and needs met.      Therapy Documentation Precautions:  Precautions Precautions: Fall Recall of Precautions/Restrictions: Intact Precaution/Restrictions Comments: oxygen  2L Restrictions Weight Bearing Restrictions Per Provider Order: No General:   Vital Signs: Therapy Vitals Temp: 98.2 F (36.8 C) Temp Source: Oral Pulse Rate: 73 Resp: 18 BP: (!) 126/48 Patient Position (if appropriate): Sitting Oxygen  Therapy SpO2: 97 % O2 Device: Nasal Cannula Pain: Pain Assessment Pain Scale: 0-10 Pain Score: 5  Pain Location: Knee Pain Intervention(s): Medication (See eMAR) Mobility:   Locomotion :    Trunk/Postural Assessment :    Balance:   Exercises:   Other Treatments:      Therapy/Group: Individual Therapy  Conrad Zajkowski 09/16/2023, 4:45 PM

## 2023-09-16 NOTE — Plan of Care (Signed)
  Problem: Consults Goal: RH GENERAL PATIENT EDUCATION Description: See Patient Education module for education specifics. Outcome: Progressing   Problem: RH BOWEL ELIMINATION Goal: RH STG MANAGE BOWEL WITH ASSISTANCE Description: STG Manage Bowel with mod I Assistance. Outcome: Progressing Goal: RH STG MANAGE BOWEL W/MEDICATION W/ASSISTANCE Description: STG Manage Bowel with Medication with mod I  Assistance. Outcome: Progressing   Problem: RH SAFETY Goal: RH STG ADHERE TO SAFETY PRECAUTIONS W/ASSISTANCE/DEVICE Description: STG Adhere to Safety Precautions With cues Assistance/Device. Outcome: Progressing   Problem: RH PAIN MANAGEMENT Goal: RH STG PAIN MANAGED AT OR BELOW PT'S PAIN GOAL Description: Pain < 4 with prns Outcome: Progressing   Problem: RH KNOWLEDGE DEFICIT GENERAL Goal: RH STG INCREASE KNOWLEDGE OF SELF CARE AFTER HOSPITALIZATION Description: Patient and sister will be able to manage care at discharge using educational resources for medications and dietary modification independently Outcome: Progressing   Problem: RH PAIN MANAGEMENT Goal: RH STG PAIN MANAGED AT OR BELOW PT'S PAIN GOAL Outcome: Progressing

## 2023-09-16 NOTE — Plan of Care (Signed)
  Problem: RH BOWEL ELIMINATION Goal: RH STG MANAGE BOWEL WITH ASSISTANCE Description: STG Manage Bowel with mod I Assistance. Outcome: Progressing   Problem: RH SAFETY Goal: RH STG ADHERE TO SAFETY PRECAUTIONS W/ASSISTANCE/DEVICE Description: STG Adhere to Safety Precautions With cues Assistance/Device. Outcome: Progressing   Problem: RH PAIN MANAGEMENT Goal: RH STG PAIN MANAGED AT OR BELOW PT'S PAIN GOAL Description: Pain < 4 with prns Outcome: Progressing

## 2023-09-16 NOTE — Group Note (Signed)
 Patient Details Name: SHANEA KARNEY MRN: [id number] DOB: 20-Dec-1965 Today's Date: 09/16/2023 Goal:  Pt will actively participate in 60 minute therapeutic dance group with supervision.  MET   Group Description: Dance Group: Pt participated in dance group with an emphasis on social interaction, motor planning, increasing overall activity tolerance and bimanual tasks. All songs were selected by group members. Dance moves included AROM of BUE/BLE gross motor movements with an emphasis on building functional endurance.   Individual level documentation: Patient completed group from sitting level using 2L O2.  O2 sats at 100%.  Patient participated at supervision level completing various dance moves.  Patient needed minimal modifications during group & initiated these modifications with Mod I.  Pt smiling and interactive throughout group.  Pt did have a couple bouts of nausea, resolved with deep breathing, rest & cold wet cloth applied to skin.   Pt's sister present & participatory as well.  Pain:no c/o Pain Assessment Pain Scale: 0-10 Pain Score: 5  Pain Location: Knee Pain Intervention(s): Medication (See eMAR)  Nikola Blackston 09/16/2023, 3:46 PM

## 2023-09-16 NOTE — Group Note (Signed)
 Patient Details Name: [name] MRN: [id number] DOB: [birth year] Today's Date: 09/16/2023  Time Calculation: OT Group Time Calculation OT Group Start Time: 1430 OT Group Stop Time: 1530 OT Group Time Calculation (min): 60 min      Group Description: Dance Group: Pt participated in dance group with an emphasis on social interaction, motor planning, increasing overall activity tolerance and bimanual tasks. All songs were selected by group members. Dance moves included AROM of BUE/BLE gross motor movements with an emphasis on building functional endurance.   Individual level documentation: Patient completed group from sitting level. Patientt needed supervision to complete various dance moves with OT providing visual model.  Patient able to create her own modification modifications during group.  Pain:  0/10  Precautions:  Falls, 2LO2   Katheryn SHAUNNA [place]ines 09/16/2023, 3:39 PM

## 2023-09-16 NOTE — Progress Notes (Signed)
 Occupational Therapy Session Note  Patient Details  Name: [name] MRN: [id number] Date of Birth: 01-Mar-1965  Today's Date: 09/16/2023 OT Individual Time: 8993-8954 OT Individual Time Calculation (min): 39 min    Short Term Goals: Week 1:  OT Short Term Goal 1 (Week 1): STG=LTG d/t ELOS  Skilled Therapeutic Interventions/Progress Updates:     Pt received sitting up in wc, dressed and ready for the day with all ADL need met upon OT arrival. Pt presenting with flat affect, to be in good spirits receptive to skilled OT session reporting 5/10 pain in R knee and mild pain in L shoulder - OT offering intermittent rest breaks, repositioning, and therapeutic support to optimize participation in therapy session. Pt on 2L O2 upon OT arrival and maintained throughout session. Focused this session on activity tolerance, dynamic balance, and increasing overall confidence in standing position.   Therapeutic Activity:  -Engaged Pt in completed dynamic standing balance functional reaching activity using RW for balance to work on blocked practice of sit<>stands, activity tolerance , and dynamic standing balance. Pt tasked with completing sit<>stands from EOB to RW and reaching posteriorly to retrieve horse shoe from mat table in standing, donning horse shoe over top of large vertical mirror Pt completed 2x6 trials with short seated rest breaks provided between trials. SUP-CGA provided for standing balance and MIN verbal cues for hand placement. PSO2 remaining >95% on 2 L O2 during activity.   -Ball pass/toss activity completed in standing position no AD to work on reactionary balance, standing tolerance, and overall confidence in standing position. Pt tasked with maintaining standing balance while passing large ball back/forth with therapy staff- able to complete with close SUP no LOB. Graded up activity to Pt throwing ball for increased balance challenge. She was able to complete 2 trials of 1.5 min  with seated rest breaks provided between and following each trial. Pt nervous to complete activity without UE support on RW, however with gentle encouragement she was able to complete activity with CGA overall, no LOB and PSO2 remained >95%.   -Pt completed functional mobility back to her room using RW for endurance training to increase overall activity tolerance for ADLs. She was able to complete ~125 ft this session with close SUP/CGA provided for safety and seated rest break provided following.   Pt was left resting in wc with call bell in reach, seatbelt alarm on, and all needs met.    Therapy Documentation Precautions:  Precautions Precautions: Fall Recall of Precautions/Restrictions: Intact Precaution/Restrictions Comments: oxygen  2L Restrictions Weight Bearing Restrictions Per Provider Order: No   Therapy/Group: Individual Therapy  [name] 09/16/2023, 7:58 AM

## 2023-09-17 MED ORDER — FAMOTIDINE 20 MG PO TABS
20.0000 mg | ORAL_TABLET | Freq: Two times a day (BID) | ORAL | Status: DC
  Administered 2023-09-17 – 2023-09-22 (×10): 20 mg via ORAL
  Filled 2023-09-17 (×10): qty 1

## 2023-09-17 MED ORDER — POLYETHYLENE GLYCOL 3350 17 G PO PACK: 17.0000 g | PACK | Freq: Every day | ORAL | Status: DC | PRN

## 2023-09-17 MED ORDER — CALCIUM POLYCARBOPHIL 625 MG PO TABS
625.0000 mg | ORAL_TABLET | Freq: Every day | ORAL | Status: DC
  Administered 2023-09-17 – 2023-09-22 (×6): 625 mg via ORAL
  Filled 2023-09-17 (×7): qty 1

## 2023-09-17 NOTE — Progress Notes (Signed)
 PROGRESS NOTE   Subjective/Complaints:  Pt reports  on self this AM- very upset and embarrassed about this.   Had loose stools-  said made it just in time but still needed to be cleaned up.   Hasn't received AM nebs yet this AM  ROS: Per HPI  Pt denies SOB, abd pain, CP, N/V/C/D, and vision changes  + Throat soreness- improved + Right knee pain-much improved today  Objective:   DG Chest 2 View Result Date: 09/15/2023 CLINICAL DATA:  Leukocytosis. EXAM: CHEST - 2 VIEW COMPARISON:  Chest radiograph dated 09/11/2023. FINDINGS: There is cardiomegaly with vascular congestion and edema. Small left pleural effusion and left lung base atelectasis or infiltrate. No pneumothorax. Median sternotomy wires. No acute osseous pathology. IMPRESSION: 1. Cardiomegaly with vascular congestion and edema. 2. Small left pleural effusion and left lung base atelectasis or infiltrate. Electronically Signed   By: Vanetta Chou M.D.   On: 09/15/2023 19:49    Recent Labs    09/15/23 0447 09/16/23 0519  WBC 11.3* 8.8  HGB 10.7* 10.1*  HCT 35.0* 33.5*  PLT 197 168    Recent Labs    09/15/23 0447 09/16/23 0519  NA 140 141  K 4.6 3.9  CL 92* 96*  CO2 39* 39*  GLUCOSE 111* 93  BUN 9 8  CREATININE 0.74 0.64  CALCIUM  9.3 9.0     Intake/Output Summary (Last 24 hours) at 09/17/2023 0908 Last data filed at 09/17/2023 0800 Gross per 24 hour  Intake 840 ml  Output --  Net 840 ml        Physical Exam: Vital Signs Blood pressure 129/67, pulse 87, temperature 98.1 F (36.7 C), resp. rate 19, height 5' 3 (1.6 m), weight 127.6 kg, SpO2 96%.     General: awake, alert, appropriate, tearful; sitting up EOB; NAD HENT: conjugate gaze; oropharynx moist CV: regular rate; no JVD Pulmonary: wheezy throughout - maybe worse RLL GI: soft, NT, ND, (+)BS- protuberant Psychiatric: appropriate- but rearful about bowel accident Neurological:  Ox3  Neurologic Exam:   Cog: AAOx3. Normal content, no dysarthria. CN: + R ptosis and exotropia; otherwise normal Sensory exam: revealed normal sensation in all dermatomal regions in bilateral  UE and LEs. Motor exam: strength normal in all myotomal regions of bilateral UE and LEs. No abnormal tone noted  Prior neuro assessment is c/w today's exam 09/17/2023.   MSK: Wearing knee brace right knee    Assessment/Plan: 1. Functional deficits which require 3+ hours per day of interdisciplinary therapy in a comprehensive inpatient rehab setting. Physiatrist is providing close team supervision and 24 hour management of active medical problems listed below. Physiatrist and rehab team continue to assess barriers to discharge/monitor patient progress toward functional and medical goals  Care Tool:  Bathing              Bathing assist Assist Level: Minimal Assistance - Patient > 75%     Upper Body Dressing/Undressing Upper body dressing   What is the patient wearing?: Dress    Upper body assist Assist Level: Supervision/Verbal cueing    Lower Body Dressing/Undressing Lower body dressing      What is the patient wearing?:  Incontinence brief     Lower body assist Assist for lower body dressing: Maximal Assistance - Patient 25 - 49%     Toileting Toileting    Toileting assist Assist for toileting: Moderate Assistance - Patient 50 - 74%     Transfers Chair/bed transfer  Transfers assist  Chair/bed transfer activity did not occur: Safety/medical concerns  Chair/bed transfer assist level: Supervision/Verbal cueing     Locomotion Ambulation   Ambulation assist      Assist level: Supervision/Verbal cueing Assistive device: Walker-rolling Max distance: 100   Walk 10 feet activity   Assist     Assist level: Supervision/Verbal cueing Assistive device: Walker-rolling   Walk 50 feet activity   Assist Walk 50 feet with 2 turns activity did not occur:  Safety/medical concerns (too weak, unsafe)  Assist level: Supervision/Verbal cueing Assistive device: Walker-rolling    Walk 150 feet activity   Assist Walk 150 feet activity did not occur: Safety/medical concerns (too weak, unsafe)  Assist level: Supervision/Verbal cueing Assistive device: Walker-rolling    Walk 10 feet on uneven surface  activity   Assist Walk 10 feet on uneven surfaces activity did not occur: Safety/medical concerns (too weak, unsafe)         Wheelchair     Assist Is the patient using a wheelchair?: Yes Type of Wheelchair: Manual    Wheelchair assist level: Total Assistance - Patient < 25% Max wheelchair distance: >215'    Wheelchair 50 feet with 2 turns activity    Assist        Assist Level: Total Assistance - Patient < 25%   Wheelchair 150 feet activity     Assist      Assist Level: Total Assistance - Patient < 25%   Blood pressure 129/67, pulse 87, temperature 98.1 F (36.7 C), resp. rate 19, height 5' 3 (1.6 m), weight 127.6 kg, SpO2 96%.  Medical Problem List and Plan: 1. Functional deficits secondary to debility secondary to exacerbation of myasthenia gravis.  Completed 5-day course of IVIG.  Continue Mestinon  30 mg 5 times daily as well as CellCept              -patient may shower             -ELOS/Goals: 7-10 days, supervision PT, sup-min OT, supervision SLP  Con't CIR PT, OT And SLP 2.  Antithrombotics: -DVT/anticoagulation:  Mechanical: Antiembolism stockings, thigh (TED hose) Bilateral lower extremities.  Venous Doppler studies negative 8/21- will start Lovenox  since at high risk for DVT/PE- and on no meds 8/22- moved dose to 60 mg per pharmacy             -antiplatelet therapy: N/A  3. 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 continue Voltaren  gel 4 times daily -prn tylenol   -might  benefit from brace with activity 8/20- pt asking to increase due to neuropathy, but explained we just increased Lyrica  8-24: Continue Voltaren  gel to right knee, scheduled Tylenol  1000 mg 3 times daily.  Given persistence of pain, discussed with family possible steroid injection into right knee; patient and family are agreeable, will defer to primary team 8/25 knee brace, consider knee injection  8/26 Ortho consult for knee injection, appreciate help 8/28 knee pain continues to improve, continue to monitor 8/29- knee pain better 4. Mood/Behavior/Sleep: Provide emotional support             -  antipsychotic agents: Abilify  2 mg daily 5. Neuropsych/cognition: This patient is capable of making decisions on her own behalf. 6. Skin/Wound Care: Routine skin checks 7. Fluids/Electrolytes/Nutrition: Routine in and outs with follow-up chemistries  8.  Acute on chronic respiratory failure with hypoxia and hypercarbia.  Extubated 8/10.  Continue BiPAP.  Continue nebulizers as directed.   -Patient on 2 L O2 nasal cannula prior to admission -keep HOB at 30 degrees 8/20- Pt's CO2 has been high - last checked 8 days prior on BMP- her CO2 is 40 today- 2 points above last check- will recheck in AM- probably need to reduce O2 since CO2 trapping and might need Pulmonary consult/ABG if no better in AM- will write to keep O2 sats at ~ 93-96% and see if can reduce O2 usage. Reached out to nursing.  8/21- d/w PA at length about this- has this chronically- will monitor clinically and make sure no decline in cognition/alertness- if so, will call Pulmonary 8/22- Sounds like louder drawing air- not exactly stridor, but she describes something stuck- asked her to take prns nebs more frequently today- already added Mucinex /Dextromethorphan   8/23: Satting well, continue current management. Will get CXR for lower lobe rhonchi today.   8-24: Persistent bilateral pleural effusions, some vascular prominence.  Start Lasix  20 mg  daily.  Demonstrated ISB with patient and discussed compliance with 10 reps every 2 hours while awake  8/27 CXR ordered -cardiomegaly with vascular congestion/edema, small left pleural effusion with left lung base atelectasis or infiltrate.  Patient overall stable on 2 L [place], creatinine stable continue current Lasix  dose  8/29- needs her albuterol  nebs this AM- a little more wheezy-  9.  Community-acquired pneumonia.  Antibiotic course completed.  Check oxygen  saturations every shift   - stable  11.  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  Dietary follow-up  8/29- BMI down to 49.84- doing better 12.  Hypertension.  Cozaar  50 mg daily.  Monitor with increased mobility  8/22- BP doing ok so far- will monitor   8-24: Artery Lasix  as above.  Monitor for hypotension.  8/28 diastolic BP a little soft but overall stable, she is not currently on losartan   8/29- Pt's BP doing well- con't regimen    09/17/2023    6:01 AM 09/17/2023    5:32 AM 09/16/2023    8:56 PM  Vitals with BMI  Weight 281 lbs 5 oz    BMI 49.84    Systolic  129 147  Diastolic  67 59  Pulse  87 81     13.  Hypothyroidism.  Synthroid  14.  Splenic laceration/hematoma.  Recommendations of supportive care per general surgery  15.  Normocytic anemia.  Follow-up CBC   8/28 stable at 10.1 hemoglobin  16. Ear wax? Blocking R ear/  8/21- will order Debrox 5 drips BID for R ear x 4 days- see if Sx's improve  17. Globus sensation. Has been occurring since extubation. Provided reassurance, place order for PRN throat lozenges.  18. Leukocytosis.  -8/27 No fevers or other signs of infection noted, Recheck CXR. Recheck tomorrow. -8/28 WBC improved to 8.8 today, monitor for signs of infection    I spent a total of 35   minutes on total care today- >50% coordination of care- due to  Review of labs, vitals and d/w nursing about pt-   LOS: 10 days A FACE TO FACE EVALUATION WAS PERFORMED  Madelon Welsch 09/17/2023, 9:08 AM

## 2023-09-17 NOTE — Plan of Care (Signed)
  Problem: Consults Goal: RH GENERAL PATIENT EDUCATION Description: See Patient Education module for education specifics. Outcome: Progressing   Problem: RH BOWEL ELIMINATION Goal: RH STG MANAGE BOWEL WITH ASSISTANCE Description: STG Manage Bowel with mod I Assistance. Outcome: Progressing Goal: RH STG MANAGE BOWEL W/MEDICATION W/ASSISTANCE Description: STG Manage Bowel with Medication with mod I  Assistance. Outcome: Progressing   Problem: RH SAFETY Goal: RH STG ADHERE TO SAFETY PRECAUTIONS W/ASSISTANCE/DEVICE Description: STG Adhere to Safety Precautions With cues Assistance/Device. Outcome: Progressing   Problem: RH PAIN MANAGEMENT Goal: RH STG PAIN MANAGED AT OR BELOW PT'S PAIN GOAL Description: Pain < 4 with prns Outcome: Progressing   Problem: RH KNOWLEDGE DEFICIT GENERAL Goal: RH STG INCREASE KNOWLEDGE OF SELF CARE AFTER HOSPITALIZATION Description: Patient and sister will be able to manage care at discharge using educational resources for medications and dietary modification independently Outcome: Progressing   Problem: RH PAIN MANAGEMENT Goal: RH STG PAIN MANAGED AT OR BELOW PT'S PAIN GOAL Outcome: Progressing

## 2023-09-17 NOTE — Group Note (Signed)
 Patient Details Name: SHONDELL FABEL MRN: [id number] DOB: [birth year] Today's Date: 09/17/2023 Goal:  Pt will be provided with verbal education on stress management/coping.  MET   Group Description: Stress management: Pt participated in group session with a focus on stress mgmt, education provided on healthy coping strategies, and social interaction. Focus of session on providing coping strategies to manage new diagnosis to allow for improved mental health to increase overall quality of life . Discussed how to break down stressors into "daily hassles," "major life stressors" and "life circumstances" in an effort to allow pts to chunk their stressors into groups and determine where to best put their efforts/time when dealing with stress. Provided active listening, emotional support and therapeutic use of self. Offered education on factors that protect us  against stress such as "daily uplifts," "healthy coping strategies" and "protective factors." Encouraged all group members to make an effort to actively recall one event from their day that was a daily uplift in an effort to protect their mindset from stressors as well as sharing this information with their caregivers to facilitate improved caregiver communication and decrease overall burden of care.  Issued pt handouts on healthy coping strategies to implement into routine.  Individual level documentation: Patient participated with moderate collaboration during session.   Pain:no c/o   Amira Podolak 09/17/2023, 12:53 PM

## 2023-09-17 NOTE — Group Note (Signed)
 Patient Details Name: [name] MRN: [id number] DOB: Aug 14, 1965 Today's Date: 09/17/2023  Time Calculation: OT Group Time Calculation OT Group Start Time: 1100 OT Group Stop Time: 1200 OT Group Time Calculation (min): 60 min      Group Description: Stress management: Pt participated in group session with a focus on stress mgmt, education provided on healthy coping strategies, and social interaction. Focus of session on providing coping strategies to manage new diagnosis to allow for improved mental health to increase overall quality of life . Discussed how to break down stressors into "daily hassles," "major life stressors" and "life circumstances" in an effort to allow pts to chunk their stressors into groups and determine where to best put their efforts/time when dealing with stress. Provided active listening, emotional support and therapeutic use of self. Offered education on factors that protect us  against stress such as "daily uplifts," "healthy coping strategies" and "protective factors." Encouraged all group members to make an effort to actively recall one event from their day that was a daily uplift in an effort to protect their mindset from stressors as well as sharing this information with their caregivers to facilitate improved caregiver communication and decrease overall burden of care.  Issued pt handouts on healthy coping strategies to implement into routine.  Individual level documentation: Patient participated with moderate collaboration during session.   Pain: 0/10  Precautions:  Falls   [name] 09/17/2023, 12:10 PM

## 2023-09-17 NOTE — Progress Notes (Signed)
 Physical Therapy Weekly Progress Note  Patient Details  Name: [name] MRN: [id number] Date of Birth: Mar 28, 1965  Beginning of progress report period: September 17, 2023 End of progress report period: September 17, 2023  Today's Date: 09/17/2023 PT Individual Time: 1020-1045 PT Individual Time Calculation (min): 25 min   Patient has met 1 of 11 short term goals.  Pt making slow steady gains towards goals.  Patient continues to demonstrate the following deficits muscle weakness, decreased cardiorespiratoy endurance, and decreased balance strategies and therefore will continue to benefit from skilled PT intervention to increase functional independence with mobility.  Pt performing bed mobility with supervision-min A with use of bed features, sit to stand with RW and Supervision, stand pivot transfer with RW Supervision, gait x150 feet with RW and Supervision. Pt is navigating 3-6 inch steps with min A and step to pattern B HR, unable to perform without HR (per home set up).   Patient progressing toward long term goals..  Continue plan of care.  PT Short Term Goals Week 1:  PT Short Term Goal 1 (Week 1): = LTG Week 2:     Skilled Therapeutic Interventions/Progress Updates:  Pt in w/c agreeable for session.  Pt requesting to use commode, pt able to amb from w/c to bathroom, transfer on/off toilet; supervision. Min A needed to donn/doff pants in standing, total assist for RLE knee brace. Pt amb to ortho gym, performed sit<->stand with UE clap when standing to assess balance, ankle/hip strategies. Pt attempted car transfers, however, due to limited LE mobility, pt unsafe to perform today. Pt does not c/o pain during session.  Due to time constraint, pt finished session in ortho prior to her group session.  Pt agreed to remain in w/c.       Skilled Therapeutic Interventions/Progress Updates:    Pt sitting up in w/c c/o feeling sick and poor appetite.  Nsg notified and provided nausea meds. Pt  agreeable for session, amb to ortho gym with RW, w/c and oxygen  following; pt taking multiple standing rest breaks up until ~ x125' and needed to sit in w/c.  Pt amb to main gym with RW, demonstrating good stride, cadence. Pt sat EOM to perform LE ex's with 2.5# ankle wts: LAQs x10, standing with RW marching 6x5, step outs 3x5.  Place mirror for visual feedback for feet/LE placement.  Pt c/o feeling ill, needing to rest and wheeled back to room. Pt requesting to stay in w/c, all items within reach.    Therapy Documentation Precautions:  Precautions Precautions: Fall Recall of Precautions/Restrictions: Intact Precaution/Restrictions Comments: oxygen  2L Restrictions Weight Bearing Restrictions Per Provider Order: No General: PT Amount of Missed Time (min): 5 Minutes PT Missed Treatment Reason: Patient ill (Comment)  Pain: Pain Assessment Pain Scale: 0-10 Pain Score: 0-No pain Pain Intervention(s): Medication (See eMAR)    Therapy/Group: Individual Therapy  [name] Fast 09/17/2023, 3:36 PM

## 2023-09-17 NOTE — Progress Notes (Addendum)
 Patient ID: [name], female   DOB: 1965-11-08, 58 y.o.   MRN: [id number]  Spoke with sister-[name] who reports they can be here on Monday from 1;00-3:00 pm for hands on education. Have messaged team and MD to make aware  10:59 Am Pt reports she has a wheelchair at home and has been approved for a hospital bed and rolling walker but not sure when will receive this. Discussed 3 in 1 and tub bench and she is in agreement with this and will place order. Will address when family here on Monday also

## 2023-09-17 NOTE — Progress Notes (Signed)
   09/17/23 2135  BiPAP/CPAP/SIPAP  $ Non-Invasive Ventilator  Non-Invasive Vent Subsequent  BiPAP/CPAP/SIPAP Pt Type Adult  BiPAP/CPAP/SIPAP DREAMSTATIOND  Reason BIPAP/CPAP not in use Non-compliant  BiPAP/CPAP /SiPAP Vitals  Pulse Rate 83  Resp 17  SpO2 96 %  Bilateral Breath Sounds Diminished

## 2023-09-17 NOTE — Plan of Care (Signed)
  Problem: RH BOWEL ELIMINATION Goal: RH STG MANAGE BOWEL WITH ASSISTANCE Description: STG Manage Bowel with mod I Assistance. Outcome: Progressing   Problem: RH SAFETY Goal: RH STG ADHERE TO SAFETY PRECAUTIONS W/ASSISTANCE/DEVICE Description: STG Adhere to Safety Precautions With cues Assistance/Device. Outcome: Progressing   Problem: RH PAIN MANAGEMENT Goal: RH STG PAIN MANAGED AT OR BELOW PT'S PAIN GOAL Description: Pain < 4 with prns Outcome: Progressing

## 2023-09-17 NOTE — Progress Notes (Signed)
 Physical Therapy Session Note  Patient Details  Name: [name] MRN: [id number] Date of Birth: 12/08/1965  Today's Date: 09/17/2023 PT Individual Time: 0920-1000 PT Individual Time Calculation (min): 40 min   Short Term Goals: Week 1:  PT Short Term Goal 1 (Week 1): = LTG   Skilled Therapeutic Interventions/Progress Updates:  Prior to session, RN relates to this PT that pt's BLE have increase in edema and would like for pt to maintain LE elevated outside of therapy. Patient seated on EOB on entrance to room. Patient alert and agreeable to PT session.   Patient with no pain complaint at start of session. When asked, pt does not want to don knee sleeve. Changed to portable O2 tank for session outside of room.   Transported dependently to main therapy gym.   Therapeutic Activity: Transfers: Pt performed sit<>stand and stand pivot transfers throughout session with supervision. Cueing provided for exhale on exertion.  Gait Training:  Pt ambulated 115 ft using RW with close supervision and close w/c follow for fatigue. Demonstrated slow but consistent pace with low but adequate step height throughout. SpO2 after bout at 84% and quickly rises with seated rest and talking in place of PLB.   CSW present and discusses DME with pt. PT relates that hospital bed and RW are scheduled to arrive to home. Potentially ordered by physician on previous unit. Discussed need for repair of BHR at home as pt relates that they are not steady. Recommended to talk to family church to see if a service ministry may be able to assess rails and provide potential fix prior to return home. CSW in agreement. Pt to request from sister and family.   Patient seated upright in w/c with BLE elevated on leg rests at end of session with brakes locked, belt alarm set, and all needs within reach.   Therapy Documentation Precautions:  Precautions Precautions: Fall Recall of Precautions/Restrictions:  Intact Precaution/Restrictions Comments: oxygen  2L Restrictions Weight Bearing Restrictions Per Provider Order: No  Pain: No pain related this session.    Therapy/Group: Individual Therapy  Mliss DELENA Milliner PT, DPT, CSRS 09/17/2023, 10:23 AM

## 2023-09-17 NOTE — Progress Notes (Signed)
 Complained of nausea, and had a small emesis of some of her chicken noodle soup. PRN zofran  given, followed by ice chips, and ginger-ale. Notified Sharlet Schmitz,  [place] Followed back up patient stated she is no longer nauseated.   Geni Armor, LPN

## 2023-09-18 DIAGNOSIS — J9612 Chronic respiratory failure with hypercapnia: Secondary | ICD-10-CM

## 2023-09-18 LAB — BLOOD GAS, ARTERIAL
Acid-Base Excess: 14.5 mmol/L — ABNORMAL HIGH (ref 0.0–2.0)
Bicarbonate: 45.7 mmol/L — ABNORMAL HIGH (ref 20.0–28.0)
O2 Saturation: 57.9 %
Patient temperature: 37
pCO2 arterial: 95 mmHg (ref 32–48)
pH, Arterial: 7.29 — ABNORMAL LOW (ref 7.35–7.45)
pO2, Arterial: 31 mmHg — CL (ref 83–108)

## 2023-09-18 NOTE — Progress Notes (Signed)
 RT called to bedside to assess pt due to lethargy. Upon arrival RT found pt placed on CPAP with 1l oxygen  bled in. Pt in no distress with VSS. This RT asked the pt if she knew where she was, the patient stated,  Yes, I am at [facility].     ABG obtained and sent to lab. Lab notified.  While in pt's room, Pt began coughing and could audibly hear rhonchi. This RT took pt off cpap, placed on 1l [place] and suctioned pt's mouth obtaining thick tenacious clear secretions.  Pt does not want to go back on CPAP at this time, family member just arrived in room and they are conversing.  Pt's VSS at this time and pt in no distress. Pt is to have family member bring in home cpap unit.   CCM notified.

## 2023-09-18 NOTE — Progress Notes (Signed)
 PROGRESS NOTE   Subjective/Complaints:  No cough, appear comfortable eating breakfast at EOB ROS: Pt denies SOB, abd pain, CP, N/V/C/D, and vision changes   Objective:   No results found.   Recent Labs    09/16/23 0519  WBC 8.8  HGB 10.1*  HCT 33.5*  PLT 168    Recent Labs    09/16/23 0519  NA 141  K 3.9  CL 96*  CO2 39*  GLUCOSE 93  BUN 8  CREATININE 0.64  CALCIUM  9.0     Intake/Output Summary (Last 24 hours) at 09/18/2023 0919 Last data filed at 09/17/2023 1900 Gross per 24 hour  Intake 277 ml  Output --  Net 277 ml        Physical Exam: Vital Signs Blood pressure (!) 142/76, pulse 89, temperature 98.1 F (36.7 C), temperature source Oral, resp. rate 16, height 5' 3 (1.6 m), weight 127.5 kg, SpO2 92%.     General: No acute distress Mood and affect are appropriate Heart: Regular rate and rhythm no rubs murmurs or extra sounds Lungs: Clear to auscultation, breathing unlabored, Rales at left base,no stridor Abdomen: Positive bowel sounds, soft nontender to palpation, nondistended Extremities: No clubbing, cyanosis, or edema Skin: No evidence of breakdown, no evidence of rash Neurologic: Cranial nerves II through XII intact, motor strength is 4/5 in bilateral deltoid, bicep, tricep, grip, hip flexor, knee extensors, ankle dorsiflexor and plantar flexor Sensory exam normal sensation to light touch and proprioception in bilateral upper and lower extremities Cerebellar exam normal finger to nose to finger as well as heel to shin in bilateral upper and lower extremities Musculoskeletal: Full range of motion in all 4 extremities. No joint swelling    MSK: Wearing knee brace right knee    Assessment/Plan: 1. Functional deficits which require 3+ hours per day of interdisciplinary therapy in a comprehensive inpatient rehab setting. Physiatrist is providing close team supervision and 24 hour  management of active medical problems listed below. Physiatrist and rehab team continue to assess barriers to discharge/monitor patient progress toward functional and medical goals  Care Tool:  Bathing              Bathing assist Assist Level: Minimal Assistance - Patient > 75%     Upper Body Dressing/Undressing Upper body dressing   What is the patient wearing?: Dress    Upper body assist Assist Level: Supervision/Verbal cueing    Lower Body Dressing/Undressing Lower body dressing      What is the patient wearing?: Incontinence brief     Lower body assist Assist for lower body dressing: Maximal Assistance - Patient 25 - 49%     Toileting Toileting    Toileting assist Assist for toileting: Minimal Assistance - Patient > 75%     Transfers Chair/bed transfer  Transfers assist  Chair/bed transfer activity did not occur: Safety/medical concerns  Chair/bed transfer assist level: Supervision/Verbal cueing     Locomotion Ambulation   Ambulation assist      Assist level: Supervision/Verbal cueing Assistive device: Walker-rolling Max distance: 125'   Walk 10 feet activity   Assist     Assist level: Supervision/Verbal cueing Assistive device: Walker-rolling  Walk 50 feet activity   Assist Walk 50 feet with 2 turns activity did not occur: Safety/medical concerns (too weak, unsafe)  Assist level: Supervision/Verbal cueing Assistive device: Walker-rolling    Walk 150 feet activity   Assist Walk 150 feet activity did not occur: Safety/medical concerns (too weak, unsafe)  Assist level: Supervision/Verbal cueing Assistive device: Walker-rolling    Walk 10 feet on uneven surface  activity   Assist Walk 10 feet on uneven surfaces activity did not occur: Safety/medical concerns (too weak, unsafe)         Wheelchair     Assist Is the patient using a wheelchair?: Yes Type of Wheelchair: Manual    Wheelchair assist level: Total  Assistance - Patient < 25% Max wheelchair distance: >215'    Wheelchair 50 feet with 2 turns activity    Assist        Assist Level: Total Assistance - Patient < 25%   Wheelchair 150 feet activity     Assist      Assist Level: Total Assistance - Patient < 25%   Blood pressure (!) 142/76, pulse 89, temperature 98.1 F (36.7 C), temperature source Oral, resp. rate 16, height 5' 3 (1.6 m), weight 127.5 kg, SpO2 92%.  Medical Problem List and Plan: 1. Functional deficits secondary to debility secondary to exacerbation of myasthenia gravis.  Completed 5-day course of IVIG.  Continue Mestinon  30 mg 5 times daily as well as CellCept              -patient may shower             -ELOS/Goals: 7-10 days, supervision PT, sup-min OT, supervision SLP  Con't CIR PT, OT And SLP 2.  Antithrombotics: -DVT/anticoagulation:  Mechanical: Antiembolism stockings, thigh (TED hose) Bilateral lower extremities.  Venous Doppler studies negative 8/21- will start Lovenox  since at high risk for DVT/PE- and on no meds 8/22- moved dose to 60 mg per pharmacy             -antiplatelet therapy: N/A  3. 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 continue Voltaren  gel 4 times daily -prn tylenol   -might benefit from brace with activity 8/20- pt asking to increase due to neuropathy, but explained we just increased Lyrica  8-24: Continue Voltaren  gel to right knee, scheduled Tylenol  1000 mg 3 times daily.   8/26 Ortho consult for knee injection, appreciate help  8/29- knee pain better 4. Mood/Behavior/Sleep: Provide emotional support             -antipsychotic agents: Abilify  2 mg daily 5. Neuropsych/cognition: This patient is capable of making decisions on her own behalf. 6. Skin/Wound Care: Routine skin checks 7. Fluids/Electrolytes/Nutrition: Routine in and outs with follow-up  chemistries  8.  Acute on chronic respiratory failure with hypoxia and hypercarbia.  Extubated 8/10.  Continue BiPAP.  Continue nebulizers as directed.   -Patient on 2 L O2 nasal cannula prior to admission -keep HOB at 30 degrees 8/20- Pt's CO2 has been high - last checked 8 days prior on BMP- her CO2 is 40 today- 2 points above last check- will recheck in AM- probably need to reduce O2 since CO2 trapping and might need Pulmonary consult/ABG if no better in AM- will write to keep O2 sats at ~ 93-96% and see if can reduce O2 usage. Reached out to nursing.    8-24: Persistent bilateral pleural  effusions, some vascular prominence.  Start Lasix  20 mg daily.  Demonstrated ISB with patient and discussed compliance with 10 reps every 2 hours while awake  8/27 CXR ordered -cardiomegaly with vascular congestion/edema, small left pleural effusion with left lung base atelectasis or infiltrate.  Patient overall stable on 2 L [place], creatinine stable continue current Lasix  dose  8/29- needs her albuterol  nebs this AM- a little more wheezy-  9.  Community-acquired pneumonia.  Antibiotic course completed.  Check oxygen  saturations every shift   - stable  11.  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  Dietary follow-up  8/29- BMI down to 49.84- doing better 12.  Hypertension.  Cozaar  50 mg daily.  Lasix  20mg  daily Controlled      09/18/2023    5:15 AM 09/18/2023    3:26 AM 09/18/2023   12:02 AM  Vitals with BMI  Weight 281 lbs 1 oz    BMI 49.8    Systolic  142 116  Diastolic  76 45  Pulse  89 88       13.  Hypothyroidism.  Synthroid  14.  Splenic laceration/hematoma.  Recommendations of supportive care per general surgery  15.  Normocytic anemia.  Follow-up CBC   8/28 stable at 10.1 hemoglobin  16. Ear wax? Blocking R ear/  8/21- will order Debrox 5 drips BID for R ear x 4 days- see if Sx's improve  17. Globus sensation. Improved no issues today 18. Leukocytosis. Resolved    Latest Ref Rng & Units  09/16/2023    5:19 AM 09/15/2023    4:47 AM 09/08/2023    6:00 AM  CBC  WBC 4.0 - 10.5 K/uL 8.8  11.3  6.6   Hemoglobin 12.0 - 15.0 g/dL 89.8  89.2  89.2   Hematocrit 36.0 - 46.0 % 33.5  35.0  35.7   Platelets 150 - 400 K/uL 168  197  330       LOS: 11 days A FACE TO FACE EVALUATION WAS PERFORMED  [name] 09/18/2023, 9:19 AM

## 2023-09-18 NOTE — Plan of Care (Signed)
  Problem: Consults Goal: RH GENERAL PATIENT EDUCATION Description: See Patient Education module for education specifics. Outcome: Progressing   Problem: RH BOWEL ELIMINATION Goal: RH STG MANAGE BOWEL WITH ASSISTANCE Description: STG Manage Bowel with mod I Assistance. Outcome: Progressing Goal: RH STG MANAGE BOWEL W/MEDICATION W/ASSISTANCE Description: STG Manage Bowel with Medication with mod I  Assistance. Outcome: Progressing   Problem: RH SAFETY Goal: RH STG ADHERE TO SAFETY PRECAUTIONS W/ASSISTANCE/DEVICE Description: STG Adhere to Safety Precautions With cues Assistance/Device. Outcome: Progressing   Problem: RH PAIN MANAGEMENT Goal: RH STG PAIN MANAGED AT OR BELOW PT'S PAIN GOAL Description: Pain < 4 with prns Outcome: Progressing   Problem: RH KNOWLEDGE DEFICIT GENERAL Goal: RH STG INCREASE KNOWLEDGE OF SELF CARE AFTER HOSPITALIZATION Description: Patient and sister will be able to manage care at discharge using educational resources for medications and dietary modification independently Outcome: Progressing   Problem: RH PAIN MANAGEMENT Goal: RH STG PAIN MANAGED AT OR BELOW PT'S PAIN GOAL Outcome: Progressing

## 2023-09-18 NOTE — Progress Notes (Signed)
 RN spoke with this patient's sister, who expressed concerns about patient's breathing. The sister reported that during phone call earlier, the patient sounded lethargic. Following this, RN assessed the patient. Patient appeared lethargic and required additional time to respond, but did open eyes when RN called her name. SpO2 was 98% on nasal cannula. RN transitioned her to BiPAP. MD was notified and is en route to evaluate patient. RT was also notified and is on the way to assess the patient.

## 2023-09-18 NOTE — Progress Notes (Signed)
 Per CCM; Pt is fine with being off BIPAP (resmed) at this time.  Pt educated on needing to wear bipap at bedtime and will pass along in report to nightshift.  CCM Whitney Harris & Dr. Annella aware.

## 2023-09-18 NOTE — Progress Notes (Signed)
 58 year old female with history of myasthenia gravis, obstructive sleep apnea and asthma seen earlier this morning.  At that time she was conversant.  According to nursing patient was speaking on the phone with family member when family member noted some lethargy.  Nursing was called to evaluate and the patient did appear lethargic.  O2 saturation was 98% on 2 L via nasal cannula.  Patient was transition to BiPAP. At the time that I entered the room the patient was wearing the BiPAP.  She opened her eyes to voice.  She followed commands moving upper and lower limbs to command. She was able to open her eyes.  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 No evidence of stridor. Lungs did reveal some crackles at the left base corresponding with prior x-ray on 8/27 showing a small left pleural effusion. Heart regular rate and rhythm Lower extremities had 1+ pretibial edema  Suspect hypercarbia as this has been an issue during acute care hospitalization, will reduce oxygen  to 1 L to increase respiratory drive.  Patient is on BiPAP as well. Have consulted pulmonary to evaluate. Have discussed this with the patient's Sister Dickey Silvan via phone Sister plans to visit the patient later this afternoon and plans to bring the patient's CPAP mask in as well

## 2023-09-18 NOTE — Progress Notes (Signed)
 Updated sister Yancy on pt this am, Yancy stated Her nose has been running and she seems sicker and hallucinating,  while talking to her on the phone. Patient upon arrival to shift 0730 had eyes closed, but after tapped a few times was arousable and answered all questions correctly.  A&Ox4.  During another rounding, Occupational therapist was in room at time and stated she answered questions accurately  while working with her. Respiratory had come in during assessment as well and assessed her during my second round at 0840 HR 85, Sp02 98% 2Lpm [place]. Care ongoing.

## 2023-09-18 NOTE — Progress Notes (Signed)
   09/18/23 2200  BiPAP/CPAP/SIPAP  $ Non-Invasive Ventilator  Non-Invasive Vent Subsequent  BiPAP/CPAP/SIPAP Pt Type Adult  BiPAP/CPAP/SIPAP DREAMSTATIOND  Mask Type Full face mask  IPAP 10 cmH20  EPAP 5 cmH2O  FiO2 (%) 28 %  Flow Rate 2 lpm  Patient Home Machine No  Patient Home Mask Yes  Patient Home Tubing Yes  CPAP/SIPAP surface wiped down Yes  Device Plugged into RED Power Outlet Yes  BiPAP/CPAP /SiPAP Vitals  Pulse Rate 88  Resp 16  SpO2 91 %  Bilateral Breath Sounds Clear;Diminished  MEWS Score/Color  MEWS Score 0  MEWS Score Color [place]

## 2023-09-18 NOTE — Progress Notes (Signed)
 Occupational Therapy Session Note  Patient Details  Name: [name] MRN: [id number] Date of Birth: [birth year]  Today's Date: 09/18/2023 OT Individual Time: 0945-1000 OT Individual Time Calculation (min): 15 min    Short Term Goals: Week 1:  OT Short Term Goal 1 (Week 1): STG=LTG d/t ELOS  Skilled Therapeutic Interventions/Progress Updates:    (1st) Occupational Therapy Treatment Session:  Patient sitting EOB at the time of arrival. The pt reported a pain response of 7 on a 0-10 for R knee pain.  The pt received breakfast prior to my arrival and was asleep, nursing returned from  warming  her breakfast.  The pt was able to transfer from supine in bed to EOB with Mod to MinA.  The pt was able to wash her face with s/uA.  The pt missed 30 minutes of her session eating her breaks. I informed the pt that I would try and return if time permitted.  Prior to exiting the room, the call light and bed side table were both within reach with all additional needs addressed prior to exiting the room.   (2nd ) Occupational Therapy Treatment Session: The pt was in the shower at the time of my arrival with the NT, the pt  was handed over to my care and was able bathe her UB/LB with close S to MinA. The pt was able to come from sit to stand using the grab bar with MinA. The pt was able to complete peri care for front and back with CGA. The pt was able to apply of deo with s/uA.  The pt was able to donn her over head shirt with s/uA, she was Dep with her brief and ModA with donning her pants. The patient was Dep with her non-skid socks.   The pt went on to complete UB exercises using the UB cycle for 5 minutes in duration. The pt went on to complete towel exercises,  1 set of 10 for shld flexion, horizontal abduction, shld rotation, and lg circles.  At the end of the session, the pt remained at w/c LOF with her feet supported, her R knee brace was donned.  The call light and bedside table were in place with  all additional  were needs addressed.   Therapy Documentation Precautions:  Precautions Precautions: Fall Recall of Precautions/Restrictions: Intact Precaution/Restrictions Comments: oxygen  2L Restrictions Weight Bearing Restrictions Per Provider Order: No  Therapy/Group: Individual Therapy  Elvera JONETTA Mace 09/18/2023, 4:52 PM

## 2023-09-18 NOTE — Consult Note (Addendum)
 NAME:  [name], MRN:  [id number], DOB:  December 24, 1965, LOS: 11 ADMISSION DATE:  09/07/2023, CONSULTATION DATE:  09/18/23 REFERRING MD:  PM&R, CHIEF COMPLAINT:  none per patient   History of Present Illness:  58 year old woman prolonged hospitalization with possible MG flare now in rehab hospital for strengthening etc.  Consulted for encephalopathy.  At time of evaluation she is sitting up in wheelchair.  Wide-awake.  Conversant.  Appropriate.  Cannot further evaluate encephalopathy is not present on my evaluation.  She did not wear CPAP this afternoon.  She took it off.  She has not worn it reliably at night.  Last usage I can see was 8/23.  She indicates she did not use it at home much either.  Discussed the importance of noninvasive positive pressure ventilation with OHS/OSA especially at night.  She expressed understanding.  She expressed okay to try BiPAP, I think this is reasonable given significant hypercarbia.  Pertinent  Medical History  OHS/OSA, NIPPV/CPAP with oxygen  bleed in at night, it appears nonadherent to this admission  Significant Hospital Events: Including procedures, antibiotic start and stop dates in addition to other pertinent events   Prolonged admission see EMR  Interim History / Subjective:    Objective    Blood pressure (!) 126/55, pulse 81, temperature 98.1 F (36.7 C), temperature source Oral, resp. rate 18, height 5' 3 (1.6 m), weight 127.5 kg, SpO2 93%.        Intake/Output Summary (Last 24 hours) at 09/18/2023 1523 Last data filed at 09/18/2023 1454 Gross per 24 hour  Intake 177 ml  Output --  Net 177 ml   Filed Weights   09/16/23 0542 09/17/23 0601 09/18/23 0515  Weight: 128 kg 127.6 kg 127.5 kg    Examination: General: Sitting up in wheelchair in no acute distress HENT: Atraumatic normocephalic Lungs: Distant clear Cardiovascular: Regular rate and rhythm Abdomen: Nondistended Extremities: Trace edema Neuro: Alert conversant no  focal deficits noted  Resolved problem list   Assessment and Plan   Encephalopathy: None present on my exam, hard to further evaluate.  Chronic hypercarbic respiratory failure: Likely OHS/OSA.  Nonadherence to CPAP certainly an issue.  Could contribute to encephalopathy at times.  Stressed importance of NIPPV.  She is amenable to trial of BiPAP, if not satisfactory return to CPAP.  She must wear this every time she sleeps.  From what I can tell last CPAP usage briefly 8/23 AM. -- Recommend IPAP 5 cmH2O over current CPAP to be used as EPAP  PCCM will sign off.  Labs   CBC: Recent Labs  Lab 09/15/23 0447 09/16/23 0519  WBC 11.3* 8.8  HGB 10.7* 10.1*  HCT 35.0* 33.5*  MCV 96.4 97.4  PLT 197 168    Basic Metabolic Panel: Recent Labs  Lab 09/15/23 0447 09/16/23 0519  NA 140 141  K 4.6 3.9  CL 92* 96*  CO2 39* 39*  GLUCOSE 111* 93  BUN 9 8  CREATININE 0.74 0.64  CALCIUM  9.3 9.0   GFR: Estimated Creatinine Clearance: 98.5 mL/min (by C-G formula based on SCr of 0.64 mg/dL). Recent Labs  Lab 09/15/23 0447 09/16/23 0519  WBC 11.3* 8.8    Liver Function Tests: No results for input(s): AST, ALT, ALKPHOS, BILITOT, PROT, ALBUMIN in the last 168 hours. No results for input(s): LIPASE, AMYLASE in the last 168 hours. No results for input(s): AMMONIA in the last 168 hours.  ABG    Component Value Date/Time   PHART 7.29 (L) 09/18/2023  1440   PCO2ART 95 (HH) 09/18/2023 1440   PO2ART 31 (LL) 09/18/2023 1440   HCO3 45.7 (H) 09/18/2023 1440   TCO2 >50 (H) 08/25/2023 1349   O2SAT 57.9 09/18/2023 1440     Coagulation Profile: No results for input(s): INR, PROTIME in the last 168 hours.  Cardiac Enzymes: No results for input(s): CKTOTAL, CKMB, CKMBINDEX, TROPONINI in the last 168 hours.  HbA1C: Hgb A1c MFr Bld  Date/Time Value Ref Range Status  08/16/2016 08:28 AM 6.0 (H) 4.8 - 5.6 % Final    Comment:    (NOTE)         Pre-diabetes: 5.7  - 6.4         Diabetes: >6.4         Glycemic control for adults with diabetes: <7.0     CBG: No results for input(s): GLUCAP in the last 168 hours.  Review of Systems:   No chest pain no orthopnea or PND.  Comprehensive review of systems otherwise negative.  Past Medical History:  She,  has a past medical history of Asthma, Depression, Heart murmur, Myasthenia gravis (HCC), Obesity, Pulmonary embolism (HCC), Sleep apnea, and Thyroid  disease.   Surgical History:   Past Surgical History:  Procedure Laterality Date   BREAST SURGERY  06/2019   breast reduction   THYROID  SURGERY     TONSILLECTOMY     TUBAL LIGATION       Social History:   reports that she has quit smoking. She has never used smokeless tobacco. She reports current alcohol use. She reports that she does not use drugs.   Family History:  Her family history is not on file.   Allergies Allergies  Allergen Reactions   Magnesium  Sulfate Other (See Comments)    Myasthenia gravis. Consider withholding replacement unless severely low.      Home Medications  Prior to Admission medications   Medication Sig Start Date End Date Taking? Authorizing Provider  acetaminophen  (TYLENOL ) 500 MG tablet Take 500 mg by mouth every 6 (six) hours as needed for mild pain.    [provider]  albuterol  (PROVENTIL ) (2.5 MG/3ML) 0.083% nebulizer solution Take 2.5 mg by nebulization every 6 (six) hours as needed for wheezing or shortness of breath.    [provider]  albuterol  (VENTOLIN  HFA) 108 (90 Base) MCG/ACT inhaler Inhale 2 puffs into the lungs every 4 (four) hours as needed for wheezing or shortness of breath.  05/28/16   [provider]  ARIPiprazole  (ABILIFY ) 2 MG tablet Take 2 mg by mouth daily.    [provider]  capsaicin  (ZOSTRIX) 0.025 % cream Apply topically 2 (two) times daily. 09/07/23   Briana Elgin LABOR, MD  cyanocobalamin  (VITAMIN B12) 1000 MCG tablet Take 1 tablet by mouth daily.  11/16/22 11/16/23  [provider]  diclofenac  Sodium (VOLTAREN ) 1 % GEL Apply 4 g topically 4 (four) times daily. 09/07/23   Briana Elgin LABOR, MD  famotidine  (PEPCID ) 20 MG tablet Take 1 tablet (20 mg total) by mouth 2 (two) times daily. 03/09/18   Henderly, Britni A, PA-C  ferrous sulfate  324 MG TBEC Take 324 mg by mouth daily with breakfast.    [provider]  levothyroxine  (SYNTHROID ) 200 MCG tablet Take 200 mcg by mouth daily before breakfast. 11/16/22 11/16/23  [provider]  losartan  (COZAAR ) 50 MG tablet Take 50 mg by mouth daily.    [provider]  mirtazapine  (REMERON ) 15 MG tablet Take 15 mg by mouth at bedtime.  08/16/23 08/15/24  [provider]  montelukast  (SINGULAIR ) 10 MG tablet Take 10 mg by mouth at bedtime.    [provider]  mycophenolate  (CELLCEPT ) 500 MG tablet Take 1,000 mg by mouth 2 (two) times daily. 06/01/16   [provider]  polyethylene glycol (MIRALAX  / GLYCOLAX ) 17 g packet Take 17 g by mouth daily. 09/08/23   Briana Elgin LABOR, MD  pregabalin  (LYRICA ) 200 MG capsule Take 200 mg by mouth 2 (two) times daily. 06/09/23 06/08/24  [provider]  pyridostigmine  (MESTINON ) 60 MG tablet Take 30 mg by mouth 5 (five) times daily.  06/09/16   [provider]  Pyridoxine  HCl (VITAMIN B6 PO) Take 1,000 mcg by mouth daily.    [provider]     Critical care time: n/a    Donnice JONELLE Beals, MD

## 2023-09-19 ENCOUNTER — Inpatient Hospital Stay (HOSPITAL_COMMUNITY)

## 2023-09-19 LAB — CBC
HCT: 32.3 % — ABNORMAL LOW (ref 36.0–46.0)
Hemoglobin: 9.4 g/dL — ABNORMAL LOW (ref 12.0–15.0)
MCH: 28.9 pg (ref 26.0–34.0)
MCHC: 29.1 g/dL — ABNORMAL LOW (ref 30.0–36.0)
MCV: 99.4 fL (ref 80.0–100.0)
Platelets: 150 K/uL (ref 150–400)
RBC: 3.25 MIL/uL — ABNORMAL LOW (ref 3.87–5.11)
RDW: 15.8 % — ABNORMAL HIGH (ref 11.5–15.5)
WBC: 9.6 K/uL (ref 4.0–10.5)
nRBC: 0.3 % — ABNORMAL HIGH (ref 0.0–0.2)

## 2023-09-19 LAB — BLOOD GAS, ARTERIAL
Acid-Base Excess: 17.4 mmol/L — ABNORMAL HIGH (ref 0.0–2.0)
Bicarbonate: 46.6 mmol/L — ABNORMAL HIGH (ref 20.0–28.0)
O2 Saturation: 97.1 %
Patient temperature: 36.7
pCO2 arterial: 76 mmHg (ref 32–48)
pH, Arterial: 7.39 (ref 7.35–7.45)
pO2, Arterial: 66 mmHg — ABNORMAL LOW (ref 83–108)

## 2023-09-19 MED ORDER — MEGESTROL ACETATE 40 MG PO TABS
40.0000 mg | ORAL_TABLET | Freq: Two times a day (BID) | ORAL | Status: DC
  Administered 2023-09-19 – 2023-09-22 (×7): 40 mg via ORAL
  Filled 2023-09-19 (×8): qty 1

## 2023-09-19 NOTE — Progress Notes (Signed)
 Date and time results received: 09/19/23 0255  Test: Blood Gas Critical Value: pCO2 76  Name of Provider Notified: Jereld PA notified and Rapid Response updated  No new orders at this time- improved from prior reading. Patient resting, no signs of distress noted.

## 2023-09-19 NOTE — Progress Notes (Signed)
 NAME:  [name], MRN:  [id number], DOB:  03-Feb-1965, LOS: 12 ADMISSION DATE:  09/07/2023, CONSULTATION DATE:  09/19/23 REFERRING MD:  PM&R, CHIEF COMPLAINT:  none per patient   History of Present Illness:  58 year old woman prolonged hospitalization with possible MG flare now in rehab hospital for strengthening etc.  Consulted for encephalopathy.  At time of evaluation she is sitting up in wheelchair.  Wide-awake.  Conversant.  Appropriate.  Cannot further evaluate encephalopathy is not present on my evaluation.  She did not wear CPAP this afternoon.  She took it off.  She has not worn it reliably at night.  Last usage I can see was 8/23.  She indicates she did not use it at home much either.  Discussed the importance of noninvasive positive pressure ventilation with OHS/OSA especially at night.  She expressed understanding.  She expressed okay to try BiPAP, I think this is reasonable given significant hypercarbia.  Pertinent  Medical History  OHS/OSA, NIPPV/CPAP with oxygen  bleed in at night, it appears nonadherent to this admission  Significant Hospital Events: Including procedures, antibiotic start and stop dates in addition to other pertinent events   8/30 Prolonged admission see EMR 8/31 rapid response called overnight for hypoxia despite BiPAP mask  Interim History / Subjective:  Seen sitting up on edge of bed alert and interactive, reports she used her BiPAP overnight  Objective    Blood pressure (!) 127/56, pulse 74, temperature 98.2 F (36.8 C), temperature source Oral, resp. rate 20, height 5' 3 (1.6 m), weight 128.1 kg, SpO2 98%.    FiO2 (%):  [28 %] 28 %   Intake/Output Summary (Last 24 hours) at 09/19/2023 0855 Last data filed at 09/18/2023 1454 Gross per 24 hour  Intake 0 ml  Output --  Net 0 ml   Filed Weights   09/17/23 0601 09/18/23 0515 09/19/23 0458  Weight: 127.6 kg 127.5 kg 128.1 kg    Examination: General: Acute on chronically ill appearing  middle aged female sitting up on edge of bed, in NAD HEENT: [place]/AT, MM pink/moist, PERRL,  Neuro: Alert and oriented x3, non-focal  CV: s1s2 regular rate and rhythm, no murmur, rubs, or gallops,  PULM:  Slightly diminished bilaterally, no to auscultation, no increased work for breathing,  GI: soft, bowel sounds active in all 4 quadrants, non-tender, non-distended, tolerating oral diet Extremities: warm/dry, no edema  Skin: no rashes or lesions  Resolved problem list   Assessment and Plan   Encephalopathy -None present on my exam, hard to further evaluate. P: Maintain neuro protective measures Nutrition and bowel regiment  Aspirations precautions  Management of hypercapnia as below   Chronic hypercarbic respiratory failure -Likely OHS/OSA.  Intermittently adherent to CPAP.  P: Educated again of importance of consistent us  of CPAP?BIPAP during periods of sleep  Given the chronic component of her hypercapnia would encourage clinical team to focus on trending mentation and SPO2 not CO2. For her a SPO2 goal off 88% and above would be ideal. In regards to CO2 we generally don't trend ABG and follow the CO2 as it is expected to remain high. Therefore if mentation changes patient needs to be placed back on CPAP/BIPAP and if this intervention isn't enough to help with lethargy a ABG can be checked at that time    Critical care time: n/a  Jayce Boyko D. [place], NP-C [facility] Personal contact information can be found on Amion  If no contact or response made please call [phone]  09/19/2023, 8:56 AM

## 2023-09-19 NOTE — Progress Notes (Signed)
 Assisted patient with urinal this morning. When performing peri care, small amount of blood noted with wiping but not seen in urine. Could not see any area of irritation. Patient denies any pain with wiping. Will report off to on coming shift.

## 2023-09-19 NOTE — Plan of Care (Signed)
  Problem: Consults Goal: RH GENERAL PATIENT EDUCATION Description: See Patient Education module for education specifics. Outcome: Progressing   Problem: RH BOWEL ELIMINATION Goal: RH STG MANAGE BOWEL WITH ASSISTANCE Description: STG Manage Bowel with mod I Assistance. Outcome: Progressing Goal: RH STG MANAGE BOWEL W/MEDICATION W/ASSISTANCE Description: STG Manage Bowel with Medication with mod I  Assistance. Outcome: Progressing   Problem: RH SAFETY Goal: RH STG ADHERE TO SAFETY PRECAUTIONS W/ASSISTANCE/DEVICE Description: STG Adhere to Safety Precautions With cues Assistance/Device. Outcome: Progressing   Problem: RH PAIN MANAGEMENT Goal: RH STG PAIN MANAGED AT OR BELOW PT'S PAIN GOAL Description: Pain < 4 with prns Outcome: Progressing   Problem: RH KNOWLEDGE DEFICIT GENERAL Goal: RH STG INCREASE KNOWLEDGE OF SELF CARE AFTER HOSPITALIZATION Description: Patient and sister will be able to manage care at discharge using educational resources for medications and dietary modification independently Outcome: Progressing   Problem: RH PAIN MANAGEMENT Goal: RH STG PAIN MANAGED AT OR BELOW PT'S PAIN GOAL Outcome: Progressing

## 2023-09-19 NOTE — Consult Note (Signed)
 Reason for Consult:PMB Referring Physician: Dr Carilyn Avelina JONETTA [name] is an 58 y.o. female. No obstetric history on file. No menses in 10 years who experienced passing a blood clot per vagina earlier this am.  She is on lovenox  60 mg SQ daily for prophylaxis No pain  I ordered a sonogram which I reviewed:  CLINICAL DATA:  Postmenopausal bleeding.   EXAM: TRANSABDOMINAL ULTRASOUND OF PELVIS   TECHNIQUE: Transabdominal ultrasound examination of the pelvis was performed including evaluation of the uterus, ovaries, adnexal regions, and pelvic cul-de-sac.   COMPARISON:  None Available.   FINDINGS: Uterus   Measurements: 13.7 x 5.8 x 9.8 cm = volume: 110 mL. Multiple hypoechoic lesions measuring up to 8.9 x 6.4 x 5.2 cm.   Endometrium   Thickness: 11 mm, thickened in the setting of postmenopausal bleeding. No focal abnormality visualized.   Right ovary   Not visualized.   Left ovary   Not visualized.   Other findings:  No abnormal free fluid.   IMPRESSION: 1. Endometrial thickening. In the setting of post-menopausal bleeding, endometrial sampling is indicated to exclude carcinoma. If results are benign, sonohysterogram should be considered for focal lesion work-up. (Ref: Radiological Reasoning: Algorithmic Workup of Abnormal Vaginal Bleeding with Endovaginal Sonography and Sonohysterography. AJR 2008; 808:D31-26). 2. Uterine fibroids.     Electronically Signed   By: Newell Eke M.D.  Menstrual History:  No LMP recorded. Patient is postmenopausal.    Past Medical History:  Diagnosis Date   Asthma    Depression    Heart murmur    Myasthenia gravis (HCC)    Obesity    Pulmonary embolism (HCC)    Sleep apnea    Thyroid  disease     Past Surgical History:  Procedure Laterality Date   BREAST SURGERY  06/2019   breast reduction   THYROID  SURGERY     TONSILLECTOMY     TUBAL LIGATION      History reviewed. No pertinent family  history.  Social History:  reports that she has quit smoking. She has never used smokeless tobacco. She reports current alcohol use. She reports that she does not use drugs.  Allergies:  Allergies  Allergen Reactions   Magnesium  Sulfate Other (See Comments)    Myasthenia gravis. Consider withholding replacement unless severely low.     Medications: I have reviewed the patient's current medications.  Review of Systems  Blood pressure (!) 124/51, pulse 89, temperature 99.1 F (37.3 C), temperature source Oral, resp. rate 18, height 5' 3 (1.6 m), weight 128.1 kg, SpO2 92%. Physical Exam  Results for orders placed or performed during the hospital encounter of 09/07/23 (from the past 48 hours)  Blood gas, arterial     Status: Abnormal   Collection Time: 09/18/23  2:40 PM  Result Value Ref Range   pH, Arterial 7.29 (L) 7.35 - 7.45   pCO2 arterial 95 (HH) 32 - 48 mmHg    Comment: CRITICAL RESULT CALLED TO, READ BACK BY AND VERIFIED WITH: SIGRID MARINA RN BY [facility] AT 1456PM [id number]    pO2, Arterial 31 (LL) 83 - 108 mmHg    Comment: CRITICAL RESULT CALLED TO, READ BACK BY AND VERIFIED WITH: SIGRID MARINA RN BY [facility] AT 1456PM [phone]    Bicarbonate 45.7 (H) 20.0 - 28.0 mmol/L   Acid-Base Excess 14.5 (H) 0.0 - 2.0 mmol/L   O2 Saturation 57.9 %   Patient temperature 37.0    Collection site RIGHT RADIAL    Allens test (pass/fail)  PASS PASS    Comment: Performed at [facility], [address] [place], [place] [address]  Blood gas, arterial     Status: Abnormal   Collection Time: 09/19/23  2:51 AM  Result Value Ref Range   pH, Arterial 7.39 7.35 - 7.45   pCO2 arterial 76 (HH) 32 - 48 mmHg    Comment: CRITICAL RESULT CALLED TO, READ BACK BY AND VERIFIED WITH: A. ROYAL, RN AT 0303 08.31.25 JLASIGAN    pO2, Arterial 66 (L) 83 - 108 mmHg   Bicarbonate 46.6 (H) 20.0 - 28.0 mmol/L   Acid-Base Excess 17.4 (H) 0.0 - 2.0 mmol/L   O2 Saturation 97.1 %   Patient  temperature 36.7    Collection site RIGHT RADIAL ARTERIAL    Drawn by CHERILYNN Scales test (pass/fail) PASS PASS    Comment: Performed at [facility], [address], [place], [place] [address]  CBC     Status: Abnormal   Collection Time: 09/19/23 10:33 AM  Result Value Ref Range   WBC 9.6 4.0 - 10.5 K/uL   RBC 3.25 (L) 3.87 - 5.11 MIL/uL   Hemoglobin 9.4 (L) 12.0 - 15.0 g/dL   HCT 67.6 (L) 63.9 - 53.9 %   MCV 99.4 80.0 - 100.0 fL   MCH 28.9 26.0 - 34.0 pg   MCHC 29.1 (L) 30.0 - 36.0 g/dL   RDW 84.1 (H) 88.4 - 84.4 %   Platelets 150 150 - 400 K/uL   nRBC 0.3 (H) 0.0 - 0.2 %    Comment: Performed at [facility], [address] [place], [place] [address]    US  PELVIS LIMITED (TRANSABDOMINAL ONLY) Result Date: 09/19/2023 CLINICAL DATA:  Postmenopausal bleeding. EXAM: TRANSABDOMINAL ULTRASOUND OF PELVIS TECHNIQUE: Transabdominal ultrasound examination of the pelvis was performed including evaluation of the uterus, ovaries, adnexal regions, and pelvic cul-de-sac. COMPARISON:  None Available. FINDINGS: Uterus Measurements: 13.7 x 5.8 x 9.8 cm = volume: 110 mL. Multiple hypoechoic lesions measuring up to 8.9 x 6.4 x 5.2 cm. Endometrium Thickness: 11 mm, thickened in the setting of postmenopausal bleeding. No focal abnormality visualized. Right ovary Not visualized. Left ovary Not visualized. Other findings:  No abnormal free fluid. IMPRESSION: 1. Endometrial thickening. In the setting of post-menopausal bleeding, endometrial sampling is indicated to exclude carcinoma. If results are benign, sonohysterogram should be considered for focal lesion work-up. (Ref: Radiological Reasoning: Algorithmic Workup of Abnormal Vaginal Bleeding with Endovaginal Sonography and Sonohysterography. AJR 2008; 808:D31-26). 2. Uterine fibroids. Electronically Signed   By: Newell Eke M.D.   On: 09/19/2023 11:19   DG Chest Port 1 View Result Date: 09/19/2023 CLINICAL DATA:  Acute respiratory distress  EXAM: PORTABLE CHEST 1 VIEW COMPARISON:  Chest x-ray 09/15/2023 FINDINGS: The heart is enlarged. Median sternotomy wires and mediastinal clips are again seen. There is a small left pleural effusion and minimal left basilar opacities, unchanged. There is no pneumothorax or acute fracture. IMPRESSION: Stable small left pleural effusion and minimal left basilar opacities. Electronically Signed   By: Greig Pique M.D.   On: 09/19/2023 01:18    Assessment/Plan: PMB with homogenous 11 mm stripe, small fibroids, on lovenox  MG with significant physical limitations, plan to be discharged 09/22/23  Would be very difficult to do EMBx at this juncture in the hospital Recommend follow up as an outpatient for EMBx Orders put in chart for follow up In the meantime ordered megestrol  40 mg BID to stabilize the endometrium  Vonn VEAR Inch 09/19/2023

## 2023-09-19 NOTE — Progress Notes (Addendum)
 PROGRESS NOTE   Subjective/Complaints:  Appreciate pulmonary note, pt had a rapid response last noc after Cont O2 sat was reading 20-40% with O2 at 2L bled into bipap.  Rapid RN turned up O2 to 10L O 2 sats responded,   Pt alert this am on Bipap  Notified per LPN lg amt of blood per vagina this am , blood in toilet, pt post menopausal x >53yrs  Denies hx of fibroids,PCP does GYN care  No lower abd pain  Mild dizziness this am  ROS: Pt denies SOB, abd pain, CP, N/V/C/D, and vision changes   Objective:   DG Chest Port 1 View Result Date: 09/19/2023 CLINICAL DATA:  Acute respiratory distress EXAM: PORTABLE CHEST 1 VIEW COMPARISON:  Chest x-ray 09/15/2023 FINDINGS: The heart is enlarged. Median sternotomy wires and mediastinal clips are again seen. There is a small left pleural effusion and minimal left basilar opacities, unchanged. There is no pneumothorax or acute fracture. IMPRESSION: Stable small left pleural effusion and minimal left basilar opacities. Electronically Signed   By: Greig Pique M.D.   On: 09/19/2023 01:18     No results for input(s): WBC, HGB, HCT, PLT in the last 72 hours.   No results for input(s): NA, K, CL, CO2, GLUCOSE, BUN, CREATININE, CALCIUM  in the last 72 hours.    Intake/Output Summary (Last 24 hours) at 09/19/2023 0837 Last data filed at 09/18/2023 1454 Gross per 24 hour  Intake 0 ml  Output --  Net 0 ml        Physical Exam: Vital Signs Blood pressure (!) 127/56, pulse 74, temperature 98.2 F (36.8 C), temperature source Oral, resp. rate 20, height 5' 3 (1.6 m), weight 128.1 kg, SpO2 98%.     General: No acute distress Mood and affect are appropriate Heart: Regular rate and rhythm no rubs murmurs or extra sounds Lungs: Clear to auscultation, breathing unlabored, Rales at left base,no stridor Abdomen: Positive bowel sounds, soft nontender to palpation,  nondistended Extremities: No clubbing, cyanosis, or edema Skin: No evidence of breakdown, no evidence of rash Neurologic: Cranial nerves II through XII intact, motor strength is 4/5 in bilateral deltoid, bicep, tricep, grip, hip flexor, knee extensors, ankle dorsiflexor and plantar flexor Sensory exam normal sensation to light touch and proprioception in bilateral upper and lower extremities Cerebellar exam normal finger to nose to finger as well as heel to shin in bilateral upper and lower extremities Musculoskeletal: Full range of motion in all 4 extremities. No joint swelling    MSK: Wearing knee brace right knee    Assessment/Plan: 1. Functional deficits which require 3+ hours per day of interdisciplinary therapy in a comprehensive inpatient rehab setting. Physiatrist is providing close team supervision and 24 hour management of active medical problems listed below. Physiatrist and rehab team continue to assess barriers to discharge/monitor patient progress toward functional and medical goals  Care Tool:  Bathing              Bathing assist Assist Level: Minimal Assistance - Patient > 75%     Upper Body Dressing/Undressing Upper body dressing   What is the patient wearing?: Dress    Upper body assist Assist  Level: Supervision/Verbal cueing    Lower Body Dressing/Undressing Lower body dressing      What is the patient wearing?: Incontinence brief     Lower body assist Assist for lower body dressing: Maximal Assistance - Patient 25 - 49%     Toileting Toileting    Toileting assist Assist for toileting: Minimal Assistance - Patient > 75%     Transfers Chair/bed transfer  Transfers assist  Chair/bed transfer activity did not occur: Safety/medical concerns  Chair/bed transfer assist level: Supervision/Verbal cueing     Locomotion Ambulation   Ambulation assist      Assist level: Supervision/Verbal cueing Assistive device: Walker-rolling Max  distance: 125'   Walk 10 feet activity   Assist     Assist level: Supervision/Verbal cueing Assistive device: Walker-rolling   Walk 50 feet activity   Assist Walk 50 feet with 2 turns activity did not occur: Safety/medical concerns (too weak, unsafe)  Assist level: Supervision/Verbal cueing Assistive device: Walker-rolling    Walk 150 feet activity   Assist Walk 150 feet activity did not occur: Safety/medical concerns (too weak, unsafe)  Assist level: Supervision/Verbal cueing Assistive device: Walker-rolling    Walk 10 feet on uneven surface  activity   Assist Walk 10 feet on uneven surfaces activity did not occur: Safety/medical concerns (too weak, unsafe)         Wheelchair     Assist Is the patient using a wheelchair?: Yes Type of Wheelchair: Manual    Wheelchair assist level: Total Assistance - Patient < 25% Max wheelchair distance: >215'    Wheelchair 50 feet with 2 turns activity    Assist        Assist Level: Total Assistance - Patient < 25%   Wheelchair 150 feet activity     Assist      Assist Level: Total Assistance - Patient < 25%   Blood pressure (!) 127/56, pulse 74, temperature 98.2 F (36.8 C), temperature source Oral, resp. rate 20, height 5' 3 (1.6 m), weight 128.1 kg, SpO2 98%.  Medical Problem List and Plan: 1. Functional deficits secondary to debility secondary to exacerbation of myasthenia gravis.  Completed 5-day course of IVIG.  Continue Mestinon  30 mg 5 times daily as well as CellCept              -patient may shower             -ELOS/Goals: 7-10 days, supervision PT, sup-min OT, supervision SLP  Con't CIR PT, OT And SLP 2.  Antithrombotics: -DVT/anticoagulation:  Mechanical: Antiembolism stockings, thigh (TED hose) Bilateral lower extremities.  Venous Doppler studies negative 8/21- will start Lovenox  since at high risk for DVT/PE- and on no meds 8/22- moved dose to 60 mg per pharmacy              -antiplatelet therapy: N/A  3. 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 continue Voltaren  gel 4 times daily -prn tylenol   -might benefit from brace with activity 8/20- pt asking to increase due to neuropathy, but explained we just increased Lyrica  8-24: Continue Voltaren  gel to right knee, scheduled Tylenol  1000 mg 3 times daily.   8/26 Ortho consult for knee injection, appreciate help  8/29- knee pain better 4. Mood/Behavior/Sleep: Provide emotional support             -antipsychotic agents: Abilify  2 mg daily 5. Neuropsych/cognition: This patient is  capable of making decisions on her own behalf. 6. Skin/Wound Care: Routine skin checks 7. Fluids/Electrolytes/Nutrition: Routine in and outs with follow-up chemistries  8.  Acute on chronic respiratory failure with hypoxia and hypercarbia.  Extubated 8/10.  Continue BiPAP.  Continue nebulizers as directed.   -Patient on 2 L O2 nasal cannula prior to admission -keep HOB at 30 degrees 8/20- Pt's CO2 has been high - last checked 8 days prior on BMP- her CO2 is 40 today- 2 points above last check- will recheck in AM- probably need to reduce O2 since CO2 trapping and might need Pulmonary consult/ABG if no better in AM- will write to keep O2 sats at ~ 93-96% and see if can reduce O2 usage. Reached out to nursing.    8-24: Persistent bilateral pleural effusions, some vascular prominence.  Start Lasix  20 mg daily.  Demonstrated ISB with patient and discussed compliance with 10 reps every 2 hours while awake  8/27 CXR ordered -cardiomegaly with vascular congestion/edema, small left pleural effusion with left lung base atelectasis or infiltrate.  Patient overall stable on 2 L [place], creatinine stable continue current Lasix  dose  8/31- hypoxia alternating with Hypocarbia, not sure if O2 sats are best measure in this pt and ABG is  preferable although more invasive For O2 sats will need to be satisfied with sats >88 Asked CCM to do f/u today for parameters, mentation rather than pCO2 will guide bipap use  9.  Community-acquired pneumonia.  Antibiotic course completed.  Check oxygen  saturations every shift   - stable  11.  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  Dietary follow-up  8/29- BMI down to 49.84- doing better 12.  Hypertension.  Cozaar  50 mg daily.  Lasix  20mg  daily Controlled      09/19/2023    7:29 AM 09/19/2023    6:10 AM 09/19/2023    4:58 AM  Vitals with BMI  Weight   282 lbs 7 oz  BMI   50.04  Systolic  127   Diastolic  56   Pulse 74 75        13.  Hypothyroidism.  Synthroid  14.  Splenic laceration/hematoma.  Recommendations of supportive care per general surgery  15.  Normocytic anemia.  Follow-up CBC   8/28 stable at 10.1 hemoglobin  16. Ear wax? Blocking R ear/  8/21- will order Debrox 5 drips BID for R ear x 4 days- see if Sx's improve  17. Globus sensation. Improved no issues today 18. Leukocytosis. Resolved    Latest Ref Rng & Units 09/16/2023    5:19 AM 09/15/2023    4:47 AM 09/08/2023    6:00 AM  CBC  WBC 4.0 - 10.5 K/uL 8.8  11.3  6.6   Hemoglobin 12.0 - 15.0 g/dL 89.8  89.2  89.2   Hematocrit 36.0 - 46.0 % 33.5  35.0  35.7   Platelets 150 - 400 K/uL 168  197  330     19.  Vaginal bleeding , will check STAT CBC , have conulted GYN, spoke with Dr Jayne who will order pelvic US  and see pt later today   LOS: 12 days A FACE TO FACE EVALUATION WAS PERFORMED  Prentice FORBES Compton 09/19/2023, 8:37 AM

## 2023-09-19 NOTE — Significant Event (Signed)
 Rapid Response Event Note   Reason for Call :  Hypoxia on bipap with 1L bled in   Per RN, pt SpO2-40s on bipap. RT was called to room and increased pt to 10L through bipap and also adjusted bipap settings. SpO2 increased with changes. Since then oxygen  weaned to 5L through bipap.  Initial Focused Assessment:  Pt lying in bed with eyes open, in no visible distress. She is alert and able to answer questions. Lungs diminished t/o. ABD large, soft. Skin cool to touch/dry.   T-98, HR-83, BP-119/49, RR-18, SpO2-94% on bipap/5L  Interventions:  PCXR Continuous bedside pulse-ox Plan of Care:  SpO2 better with increased oxygen  and adjusted bipap settings. Await PCXR. Continue to monitor pt closely. Please call RRT if further assistance needed.  Event Summary:   MD Notified: Jereld, PA Call Time:0018 Arrival Time:0028 End Upfz:[id number]  Tish Graeme Piety, RN

## 2023-09-19 NOTE — Progress Notes (Signed)
 During midnight round Spo2 checked and readings noted between 40s-75. Bipap mask still in place with 2L oxygen  bled in. Patient alert, responds to commands and does not appear in any distress Respiratory, charge and Rapid response notified. Respiratory adjusted settings to Bipap and oxygen . Oxygen  back up to 90s. Mercedes PA also notified of situation. Patient now on continuous pulse ox. Continue to wean oxygen  as appropriate.  0220- Notified Rapid Response and Respiratory again since unable to get consistent readings with pulse ox. Patient remain alert, follows commands, in no distress. Pulse ox switched to left ear. More consistent readings noted with oxygen  in 90s range.

## 2023-09-20 NOTE — Progress Notes (Signed)
 PROGRESS NOTE   Subjective/Complaints:  Pt reports was up with Pulse ox beeping all night-  Is sleep deprived. Kept her awake all night.  Has HA from it.  LBM this AM.   Seen by Gyn- they feel she needs endometrial Biopsy- to be done outpt.  started on megesterol 40 mg BID to stabilize endometrium.    ROS:  Pt denies SOB, abd pain, CP, N/V/C/D, and vision changes    Objective:   US  PELVIS LIMITED (TRANSABDOMINAL ONLY) Result Date: 09/19/2023 CLINICAL DATA:  Postmenopausal bleeding. EXAM: TRANSABDOMINAL ULTRASOUND OF PELVIS TECHNIQUE: Transabdominal ultrasound examination of the pelvis was performed including evaluation of the uterus, ovaries, adnexal regions, and pelvic cul-de-sac. COMPARISON:  None Available. FINDINGS: Uterus Measurements: 13.7 x 5.8 x 9.8 cm = volume: 110 mL. Multiple hypoechoic lesions measuring up to 8.9 x 6.4 x 5.2 cm. Endometrium Thickness: 11 mm, thickened in the setting of postmenopausal bleeding. No focal abnormality visualized. Right ovary Not visualized. Left ovary Not visualized. Other findings:  No abnormal free fluid. IMPRESSION: 1. Endometrial thickening. In the setting of post-menopausal bleeding, endometrial sampling is indicated to exclude carcinoma. If results are benign, sonohysterogram should be considered for focal lesion work-up. (Ref: Radiological Reasoning: Algorithmic Workup of Abnormal Vaginal Bleeding with Endovaginal Sonography and Sonohysterography. AJR 2008; [id number]:D31-26). 2. Uterine fibroids. Electronically Signed   By: Newell Eke M.D.   On: 09/19/2023 11:19   DG Chest Port 1 View Result Date: 09/19/2023 CLINICAL DATA:  Acute respiratory distress EXAM: PORTABLE CHEST 1 VIEW COMPARISON:  Chest x-ray 09/15/2023 FINDINGS: The heart is enlarged. Median sternotomy wires and mediastinal clips are again seen. There is a small left pleural effusion and minimal left basilar opacities,  unchanged. There is no pneumothorax or acute fracture. IMPRESSION: Stable small left pleural effusion and minimal left basilar opacities. Electronically Signed   By: Greig Pique M.D.   On: 09/19/2023 01:18     Recent Labs    09/19/23 1033  WBC 9.6  HGB 9.4*  HCT 32.3*  PLT 150     No results for input(s): NA, K, CL, CO2, GLUCOSE, BUN, CREATININE, CALCIUM  in the last 72 hours.    Intake/Output Summary (Last 24 hours) at 09/20/2023 0919 Last data filed at 09/20/2023 0700 Gross per 24 hour  Intake 354 ml  Output --  Net 354 ml        Physical Exam: Vital Signs Blood pressure (!) 104/48, pulse 90, temperature 97.8 F (36.6 C), temperature source Oral, resp. rate 18, height 5' 3 (1.6 m), weight 127 kg, SpO2 98%.     General: awake, alert, appropriate, sitting up EOB; NAD HENT: conjugate gaze; oropharynx dry- wearing O2 2L by [place]-  CV: regular rate and rhythm; no JVD Pulmonary: decreased breath sound,s but no W/R/R heard- O2 sats 100% on pulse ox GI: soft, NT, ND, (+)BS- protuberant Psychiatric: appropriate- flat Neurological: Ox3  Skin: No evidence of breakdown, no evidence of rash Neurologic: Cranial nerves II through XII intact, motor strength is 4/5 in bilateral deltoid, bicep, tricep, grip, hip flexor, knee extensors, ankle dorsiflexor and plantar flexor Sensory exam normal sensation to light touch and proprioception in bilateral upper and lower  extremities Cerebellar exam normal finger to nose to finger as well as heel to shin in bilateral upper and lower extremities Musculoskeletal: Full range of motion in all 4 extremities. No joint swelling    MSK: Wearing knee brace right knee    Assessment/Plan: 1. Functional deficits which require 3+ hours per day of interdisciplinary therapy in a comprehensive inpatient rehab setting. Physiatrist is providing close team supervision and 24 hour management of active medical problems listed below. Physiatrist  and rehab team continue to assess barriers to discharge/monitor patient progress toward functional and medical goals  Care Tool:  Bathing              Bathing assist Assist Level: Minimal Assistance - Patient > 75%     Upper Body Dressing/Undressing Upper body dressing   What is the patient wearing?: Dress    Upper body assist Assist Level: Supervision/Verbal cueing    Lower Body Dressing/Undressing Lower body dressing      What is the patient wearing?: Incontinence brief     Lower body assist Assist for lower body dressing: Maximal Assistance - Patient 25 - 49%     Toileting Toileting    Toileting assist Assist for toileting: Minimal Assistance - Patient > 75%     Transfers Chair/bed transfer  Transfers assist  Chair/bed transfer activity did not occur: Safety/medical concerns  Chair/bed transfer assist level: Supervision/Verbal cueing     Locomotion Ambulation   Ambulation assist      Assist level: Supervision/Verbal cueing Assistive device: Walker-rolling Max distance: 125'   Walk 10 feet activity   Assist     Assist level: Supervision/Verbal cueing Assistive device: Walker-rolling   Walk 50 feet activity   Assist Walk 50 feet with 2 turns activity did not occur: Safety/medical concerns (too weak, unsafe)  Assist level: Supervision/Verbal cueing Assistive device: Walker-rolling    Walk 150 feet activity   Assist Walk 150 feet activity did not occur: Safety/medical concerns (too weak, unsafe)  Assist level: Supervision/Verbal cueing Assistive device: Walker-rolling    Walk 10 feet on uneven surface  activity   Assist Walk 10 feet on uneven surfaces activity did not occur: Safety/medical concerns (too weak, unsafe)         Wheelchair     Assist Is the patient using a wheelchair?: Yes Type of Wheelchair: Manual    Wheelchair assist level: Total Assistance - Patient < 25% Max wheelchair distance: >215'     Wheelchair 50 feet with 2 turns activity    Assist        Assist Level: Total Assistance - Patient < 25%   Wheelchair 150 feet activity     Assist      Assist Level: Total Assistance - Patient < 25%   Blood pressure (!) 104/48, pulse 90, temperature 97.8 F (36.6 C), temperature source Oral, resp. rate 18, height 5' 3 (1.6 m), weight 127 kg, SpO2 98%.  Medical Problem List and Plan: 1. Functional deficits secondary to debility secondary to exacerbation of myasthenia gravis.  Completed 5-day course of IVIG.  Continue Mestinon  30 mg 5 times daily as well as CellCept              -patient may shower             -ELOS/Goals: 7-10 days, supervision PT, sup-min OT, supervision SLP  Con't CIR PT, OT and SLP 2.  Antithrombotics: -DVT/anticoagulation:  Mechanical: Antiembolism stockings, thigh (TED hose) Bilateral lower extremities.  Venous Doppler studies negative 8/21- will  start Lovenox  since at high risk for DVT/PE- and on no meds 8/22- moved dose to 60 mg per pharmacy             -antiplatelet therapy: N/A  3. 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 continue Voltaren  gel 4 times daily -prn tylenol   -might benefit from brace with activity 8/20- pt asking to increase due to neuropathy, but explained we just increased Lyrica  8-24: Continue Voltaren  gel to right knee, scheduled Tylenol  1000 mg 3 times daily.   8/26 Ortho consult for knee injection, appreciate help 8/29- knee pain better 4. Mood/Behavior/Sleep: Provide emotional support             -antipsychotic agents: Abilify  2 mg daily 5. Neuropsych/cognition: This patient is capable of making decisions on her own behalf. 6. Skin/Wound Care: Routine skin checks 7. Fluids/Electrolytes/Nutrition: Routine in and outs with follow-up chemistries  8.  Acute on chronic respiratory failure with  hypoxia and hypercarbia.  Extubated 8/10.  Continue BiPAP.  Continue nebulizers as directed.   -Patient on 2 L O2 nasal cannula prior to admission -keep HOB at 30 degrees 8/20- Pt's CO2 has been high - last checked 8 days prior on BMP- her CO2 is 40 today- 2 points above last check- will recheck in AM- probably need to reduce O2 since CO2 trapping and might need Pulmonary consult/ABG if no better in AM- will write to keep O2 sats at ~ 93-96% and see if can reduce O2 usage. Reached out to nursing.    8-24: Persistent bilateral pleural effusions, some vascular prominence.  Start Lasix  20 mg daily.  Demonstrated ISB with patient and discussed compliance with 10 reps every 2 hours while awake  8/27 CXR ordered -cardiomegaly with vascular congestion/edema, small left pleural effusion with left lung base atelectasis or infiltrate.  Patient overall stable on 2 L [place], creatinine stable continue current Lasix  dose  8/31- hypoxia alternating with Hypocarbia, not sure if O2 sats are best measure in this pt and ABG is preferable although more invasive For O2 sats will need to be satisfied with sats >88 Asked CCM to do f/u today for parameters, mentation rather than pCO2 will guide bipap use   9/1- sats >90% overnight per nursing- has pulse ox- pt very upset didn't sleep due to loud beeping- d/w nursing to turn it down- and only wear when sleeping 9.  Community-acquired pneumonia.  Antibiotic course completed.  Check oxygen  saturations every shift   - stable  11.  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  Dietary follow-up  8/29- BMI down to 49.84- doing better 12.  Hypertension.  Cozaar  50 mg daily.  Lasix  20mg  daily Controlled  9/1- BP soft, but doing OK- no OH Sx's    09/20/2023    4:04 AM 09/20/2023    3:59 AM 09/19/2023    8:10 PM  Vitals with BMI  Weight 280 lbs    BMI 49.61    Systolic  104   Diastolic  48   Pulse  90 89       13.  Hypothyroidism.  Synthroid  14.  Splenic laceration/hematoma.   Recommendations of supportive care per general surgery  15.  Normocytic anemia.  Follow-up CBC   8/28 stable at 10.1 hemoglobin  16. Ear wax? Blocking R ear/  8/21- will order Debrox 5 drips BID for R ear x 4 days- see  if Sx's improve  17. Globus sensation. Improved no issues today 18. Leukocytosis. Resolved    Latest Ref Rng & Units 09/19/2023   10:33 AM 09/16/2023    5:19 AM 09/15/2023    4:47 AM  CBC  WBC 4.0 - 10.5 K/uL 9.6  8.8  11.3   Hemoglobin 12.0 - 15.0 g/dL 9.4  89.8  89.2   Hematocrit 36.0 - 46.0 % 32.3  33.5  35.0   Platelets 150 - 400 K/uL 150  168  197     19.  Vaginal bleeding , will check STAT CBC , have conulted GYN, spoke with Dr Jayne who will order pelvic US  and see pt later today   9/1- Has 11mm endometrium- needs oupt Endometrial biopsy, per Gyn- have scheduled f/u- and started Megasterol 40 mg BID to stabilize endometrium- will recheck CBC tomorrow since Hb down to 9.4 from 10.1- and prior 10.7   I spent a total of 43   minutes on total care today- >50% coordination of care- due to  D/w team- and nursing- went over notes from rapid response, CCM, Gyn, and PM&R notes- also labs- drop in hb- will recheck in AM  LOS: 13 days A FACE TO FACE EVALUATION WAS PERFORMED  [name] 09/20/2023, 9:19 AM

## 2023-09-20 NOTE — Progress Notes (Signed)
 Patient ID: [name], female   DOB: [birth year], 58 y.o.   MRN: [id number] Met with six siblings and mom who are here for family education in preparation for discharge 9/3. Hospital bed to be delivered tomorrow-according to family and have added rolling walker to [employer] order of tub bench and commode. Discussed home health and all have no preference as long as insurance takes it. Work on discharge Wed. Donna-PT aware family here for education

## 2023-09-20 NOTE — Progress Notes (Signed)
 Occupational Therapy Session Note  Patient Details  Name: [name] MRN: [id number] Date of Birth: Mar 16, 1965  Today's Date: 09/20/2023 OT Individual Time: 8886-8782 OT Individual Time Calculation (min): 64 min    Short Term Goals: Week 2:  OT Short Term Goal 1 (Week 2): STG=LTG d/t ELOS  Skilled Therapeutic Interventions/Progress Updates:      Therapy Documentation Precautions:  Precautions Precautions: Fall Recall of Precautions/Restrictions: Intact Precaution/Restrictions Comments: oxygen  2L Restrictions Weight Bearing Restrictions Per Provider Order: No General:. Pt seated in W/C upon OT arrival, agreeable to OT.  Pain: unrated pain reported in Rt knee, activity, intermittent rest breaks, distractions provided for pain management, pt reports tolerable to proceed.   ADL: OT providing skilled intervention on ADL retraining in order to increase independence with tasks and increase activity tolerance. Pt completed the following tasks at the current level of assist: Toilet transfer: SBA ambulating from chair with RW, no SOB/LOB Toileting: SBA, pt reporting urgency with slight accident in underwear, pt able to complete 3/3 steps with urination  LB dressing: SBA using reacher, pt able to thread over feet and stand unsupported to manage over waist  Exercises: Pt issued UE theraband HEP in order to increase functional strength, endurance and activity tolerance in order to increase independence in ADLs such as bathing. Pt issued red theraband and discussed direction/technique of exercises, demonstrating verbal understanding. Pt completed 3x10 exercises at listed below: -resistive clam shells -triceps extensions -horizontal shoulder abduction -seated marches  Pt seated in W/C at end of session with W/C alarm donned, call light within reach and 4Ps assessed.    Therapy/Group: Individual Therapy  Camie Hoe, OTD, OTR/L 09/20/2023, 4:53 PM

## 2023-09-20 NOTE — Progress Notes (Addendum)
 Physical Therapy Session Note  Patient Details  Name: [name] MRN: [id number] Date of Birth: [birth year]  Today's Date: 09/20/2023 PT Individual Time: 0830-0930 PT Individual Time Calculation (min): 60 min   Short Term Goals: Week 1:  PT Short Term Goal 1 (Week 1): = LTG Week 2:     Skilled Therapeutic Interventions/Progress Updates:    Pt at EOB when PT arrived, pt agreeable for session.  Pt states she is tired this morning from poor night sleep.  Pt reports she has to use restroom, amb with RW with Supervision.  Pt had significant amount of blood with urine and feces, Nsg notified.  Pt stood during max A with pericare.  Pt states right knee is ok, states her brace aggravates knee.  Pt amb x120' before needing to sit in w/c.  Pt continued to amb to main gym to stairs with x3 short standing rest breaks. Pt negotiated 3 steps using x2 rails, vc to ascend with LLE given, pt descending 6 steps x2 railing, pt c/o bilateral feet neuropathy, however, states, let's get it over with!  Rest in w/c then continued ascending/descending 6 steps x2 railing with min A.  Post activity, pt c/o 7/10 right knee pain. After extended rest, pt performed x10 alternating step touch on 6 stair with x2 railing for support. Pt amb back to room ~x90' before wanting to rest.  PT wheeled pt back to room, pt up in w/c with all items within reach.   Second Session: Family education/ Tx Pt sitting on EOB with family in room.  PT discussed with sisters home safety precautions, teaching stair negotiation; with live in sister assisting pt 2x4 steps with x2 railing. Pt appears to be fatigued this afternoon more so than usual.  Bilateral feet neuropathy and right knee pain (7/10) present during session making stair negotiation timely and unsteady at times; mostly going down.  Sister, [name], able to position herself properly, guide pt down, encourage her and safely maneuver task.  After rest, pt wheeled to ortho gym  to navigate ramp as her sister mentioned getting a ramp instead of using stairs. Pt able to perform without difficulty, moderately fatigued after task and needed an extended rest break. Pt willing to amb back to her room with RW, supervision x2 helpers with short standing rest breaks ~x4. Pt sat up at EOB, with family at end of session.    Therapy Documentation Precautions:  Precautions Precautions: Fall Recall of Precautions/Restrictions: Intact Precaution/Restrictions Comments: oxygen  2L Restrictions Weight Bearing Restrictions Per Provider Order: No  Pain: 7/10   Therapy/Group: Individual Therapy  [name] Fast 09/20/2023, 10:00 AM

## 2023-09-20 NOTE — Plan of Care (Signed)
  Problem: Consults Goal: RH GENERAL PATIENT EDUCATION Description: See Patient Education module for education specifics. Outcome: Progressing   Problem: RH BOWEL ELIMINATION Goal: RH STG MANAGE BOWEL WITH ASSISTANCE Description: STG Manage Bowel with mod I Assistance. Outcome: Progressing Goal: RH STG MANAGE BOWEL W/MEDICATION W/ASSISTANCE Description: STG Manage Bowel with Medication with mod I  Assistance. Outcome: Progressing   Problem: RH SAFETY Goal: RH STG ADHERE TO SAFETY PRECAUTIONS W/ASSISTANCE/DEVICE Description: STG Adhere to Safety Precautions With cues Assistance/Device. Outcome: Progressing   Problem: RH PAIN MANAGEMENT Goal: RH STG PAIN MANAGED AT OR BELOW PT'S PAIN GOAL Description: Pain < 4 with prns Outcome: Progressing   Problem: RH KNOWLEDGE DEFICIT GENERAL Goal: RH STG INCREASE KNOWLEDGE OF SELF CARE AFTER HOSPITALIZATION Description: Patient and sister will be able to manage care at discharge using educational resources for medications and dietary modification independently Outcome: Progressing   Problem: RH PAIN MANAGEMENT Goal: RH STG PAIN MANAGED AT OR BELOW PT'S PAIN GOAL Outcome: Progressing

## 2023-09-20 NOTE — Progress Notes (Signed)
 Occupational Therapy Weekly Progress Note  Patient Details  Name: [name] MRN: [id number] Date of Birth: 1965/02/11  Beginning of progress report period: September 08, 2023 End of progress report period: September 18, 2023   Patient has STG=LTG d/t ELOS.  Pt progressing with ADLs and on track to meet goals. Pt with decreased pain in Rt knee and able to participate increased therapy. Pt able to take rest breaks when appropriate.   Patient continues to demonstrate the following deficits: muscle weakness, decreased cardiorespiratoy endurance, and decreased standing balance and decreased balance strategies and therefore will continue to benefit from skilled OT intervention to enhance overall performance with BADL and Reduce care partner burden.  Patient progressing toward long term goals.  Continue plan of care.  OT Short Term Goals Week 1:  OT Short Term Goal 1 (Week 1): STG=LTG d/t ELOS OT Short Term Goal 1 - Progress (Week 1): Progressing toward goal Week 2:  OT Short Term Goal 1 (Week 2): STG=LTG d/t ELOS  Skilled Therapeutic Interventions/Progress Updates:      Therapy Documentation Precautions:  Precautions Precautions: Fall Recall of Precautions/Restrictions: Intact Precaution/Restrictions Comments: oxygen  2L Restrictions Weight Bearing Restrictions Per Provider Order: No   Therapy/Group: Individual Therapy  Camie Hoe, OTD, OTR/L 09/20/2023, 4:52 PM

## 2023-09-20 NOTE — Progress Notes (Signed)
 1915- Spo2 checked and readings noted between 88 Pulse 90.  Patient remain alert, follows commands, in no distress. Pulse ox switched to left ear.   2030- Bipap mask in place  by RT. Patient alert, responds to commands and does not appear in any distress.    2135 Prescribed medication administered. Bipap mask readjusted. Patient denies any Distress, discomfort or pain.    0130 Patient resting soundly. Spo2 90. No signs of distress or discomfort observed.   0500 Patient resting. Spo2 91. No signs oof distress or discomfort  0700 Patient awake. Breakfast served.

## 2023-09-21 ENCOUNTER — Other Ambulatory Visit (HOSPITAL_COMMUNITY): Payer: Self-pay

## 2023-09-21 LAB — CBC WITH DIFFERENTIAL/PLATELET
Abs Immature Granulocytes: 0.09 K/uL — ABNORMAL HIGH (ref 0.00–0.07)
Basophils Absolute: 0 K/uL (ref 0.0–0.1)
Basophils Relative: 1 %
Eosinophils Absolute: 0.2 K/uL (ref 0.0–0.5)
Eosinophils Relative: 3 %
HCT: 31.6 % — ABNORMAL LOW (ref 36.0–46.0)
Hemoglobin: 9.5 g/dL — ABNORMAL LOW (ref 12.0–15.0)
Immature Granulocytes: 1 %
Lymphocytes Relative: 21 %
Lymphs Abs: 1.4 K/uL (ref 0.7–4.0)
MCH: 29.1 pg (ref 26.0–34.0)
MCHC: 30.1 g/dL (ref 30.0–36.0)
MCV: 96.9 fL (ref 80.0–100.0)
Monocytes Absolute: 0.6 K/uL (ref 0.1–1.0)
Monocytes Relative: 8 %
Neutro Abs: 4.5 K/uL (ref 1.7–7.7)
Neutrophils Relative %: 66 %
Platelets: 146 K/uL — ABNORMAL LOW (ref 150–400)
RBC: 3.26 MIL/uL — ABNORMAL LOW (ref 3.87–5.11)
RDW: 16.2 % — ABNORMAL HIGH (ref 11.5–15.5)
WBC: 6.8 K/uL (ref 4.0–10.5)
nRBC: 0.7 % — ABNORMAL HIGH (ref 0.0–0.2)

## 2023-09-21 MED ORDER — ALBUTEROL SULFATE HFA 108 (90 BASE) MCG/ACT IN AERS
2.0000 | INHALATION_SPRAY | RESPIRATORY_TRACT | 0 refills | Status: AC | PRN
  Filled 2023-09-21: qty 6.7, 17d supply, fill #0

## 2023-09-21 MED ORDER — VITAMIN B-12 1000 MCG PO TABS
1000.0000 ug | ORAL_TABLET | Freq: Every day | ORAL | 0 refills | Status: AC
  Filled 2023-09-21 (×2): qty 30, 30d supply, fill #0

## 2023-09-21 MED ORDER — LEVOTHYROXINE SODIUM 200 MCG PO TABS
200.0000 ug | ORAL_TABLET | Freq: Every day | ORAL | 0 refills | Status: AC
  Filled 2023-09-21: qty 30, 30d supply, fill #0

## 2023-09-21 MED ORDER — FUROSEMIDE 20 MG PO TABS
20.0000 mg | ORAL_TABLET | Freq: Every day | ORAL | 0 refills | Status: AC
  Filled 2023-09-21: qty 30, 30d supply, fill #0

## 2023-09-21 MED ORDER — MONTELUKAST SODIUM 10 MG PO TABS
10.0000 mg | ORAL_TABLET | Freq: Every day | ORAL | 0 refills | Status: AC
  Filled 2023-09-21: qty 30, 30d supply, fill #0

## 2023-09-21 MED ORDER — FAMOTIDINE 20 MG PO TABS
20.0000 mg | ORAL_TABLET | Freq: Two times a day (BID) | ORAL | 0 refills | Status: AC
  Filled 2023-09-21: qty 60, 30d supply, fill #0

## 2023-09-21 MED ORDER — PYRIDOXINE HCL 50 MG PO TABS
100.0000 mg | ORAL_TABLET | Freq: Every day | ORAL | 0 refills | Status: AC
  Filled 2023-09-21: qty 60, 30d supply, fill #0

## 2023-09-21 MED ORDER — FERROUS SULFATE 325 (65 FE) MG PO TABS
325.0000 mg | ORAL_TABLET | Freq: Every day | ORAL | 0 refills | Status: AC
  Filled 2023-09-21: qty 30, 30d supply, fill #0

## 2023-09-21 MED ORDER — MIRTAZAPINE 15 MG PO TABS
15.0000 mg | ORAL_TABLET | Freq: Every day | ORAL | 0 refills | Status: AC
  Filled 2023-09-21: qty 30, 30d supply, fill #0

## 2023-09-21 MED ORDER — CAPSAICIN 0.025 % EX CREA: TOPICAL_CREAM | Freq: Two times a day (BID) | CUTANEOUS | Status: AC

## 2023-09-21 MED ORDER — POLYETHYLENE GLYCOL 3350 17 G PO PACK: 17.0000 g | PACK | Freq: Every day | ORAL | Status: AC | PRN

## 2023-09-21 MED ORDER — DICLOFENAC SODIUM 1 % EX GEL
4.0000 g | Freq: Four times a day (QID) | CUTANEOUS | 0 refills | Status: AC
  Filled 2023-09-21: qty 100, 7d supply, fill #0

## 2023-09-21 MED ORDER — ARIPIPRAZOLE 2 MG PO TABS
2.0000 mg | ORAL_TABLET | Freq: Every day | ORAL | 0 refills | Status: AC
  Filled 2023-09-21: qty 30, 30d supply, fill #0

## 2023-09-21 MED ORDER — PREGABALIN 200 MG PO CAPS
200.0000 mg | ORAL_CAPSULE | Freq: Two times a day (BID) | ORAL | 0 refills | Status: AC
  Filled 2023-09-21: qty 60, 30d supply, fill #0

## 2023-09-21 MED ORDER — CALCIUM POLYCARBOPHIL 625 MG PO TABS
625.0000 mg | ORAL_TABLET | Freq: Every day | ORAL | 0 refills | Status: AC
  Filled 2023-09-21: qty 30, 30d supply, fill #0

## 2023-09-21 NOTE — Progress Notes (Signed)
 Occupational Therapy Session Note  Patient Details  Name: [name] MRN: [id number] Date of Birth: May 08, 1965  Today's Date: 09/21/2023 OT Individual Time: 1100-1158 OT Individual Time Calculation (min): 58 min    Skilled Therapeutic Interventions/Progress Updates: Patient received sitting up in w/c on 2L O2 via [place]. Patient wanting to get in the shower for OT treatment. Assisted patient with ADL's as listed below. Patient ambulated into shower with transfer bench without assist. Patient motivated to return home. Did not express any concerns with ability to  perform self care unassisted at home. Patient able to recall 3/3 BIMs and was oriented to date and situation. Patient did not express any concerns with memory or safety.       Therapy Documentation Precautions:  Precautions Precautions: Fall Recall of Precautions/Restrictions: Intact Precaution/Restrictions Comments: oxygen  2L via [place] Required Braces or Orthoses: Knee Immobilizer - Right Knee Immobilizer - Right: On when out of bed or walking Restrictions Weight Bearing Restrictions Per Provider Order: No General:   Vital Signs: Oxygen  Therapy SpO2: 99 % O2 Device: Nasal Cannula O2 Flow Rate (L/min): 2 L/min Pain: Pain Assessment Pain Scale: 0-10 Pain Score: 2  Pain Location: Knee Pain Intervention(s): Medication (See eMAR) ADL: ADL Grooming: Independent Where Assessed-Grooming: Sitting at sink Upper Body Bathing: Mod I due to increased time Where Assessed-Upper Body Bathing: Shower Lower Body Bathing: Mod I using adaptive equipment Where Assessed-Lower Body Bathing: Shower Upper Body Dressing: Mod I Where Assessed-Upper Body Dressing: Wheelchair Lower Body Dressing: assistance to don brief- does not wear brief at home. Donned pants with reacher Where Assessed-Lower Body Dressing: Secondary school teacher: [employer] Walk-In Engineer, structural Method: Designer, industrial/product: Emergency planning/management officer,  Grab bars Vision Baseline Vision/History: 1 Wears glasses Wears Glasses: Reading only Patient Visual Report: No change from baseline Vision Assessment?: Wears glasses for reading Perception  Perception: Within Functional Limits Praxis Praxis: WFL    Therapy/Group: Individual Therapy  [name] 09/21/2023, 12:31 PM

## 2023-09-21 NOTE — Progress Notes (Signed)
 Occupational Therapy Discharge Summary  Patient Details  Name: [name] MRN: [id number] Date of Birth: January 21, 1965  Date of Discharge from OT service:September 21, 2023  Today's Date: 09/21/2023 OT Individual Time: 1400-1440 OT Individual Time Calculation (min): 40 min   Patient has met 8 of 8 long term goals due to improved activity tolerance, improved balance, postural control, and ability to compensate for deficits.  Patient to discharge at overall Modified Independent level.  Patient's care partner is independent to provide the necessary physical and cognitive assistance at discharge. Family ed completed, family feeling confident to provide support if necessary.   Reasons goals not met: All goals met   Recommendation:  Patient will benefit from ongoing skilled OT services in home health setting to continue to advance functional skills in the area of BADL and Reduce care partner burden.  Equipment: No equipment provided, pt already has all equipment.  Reasons for discharge: treatment goals met and discharge from hospital  Patient/family agrees with progress made and goals achieved: Yes  OT Discharge Precautions/Restrictions  Precautions Precautions: Fall Recall of Precautions/Restrictions: Intact Precaution/Restrictions Comments: oxygen  2L via [place] Restrictions Weight Bearing Restrictions Per Provider Order: No Pain Pain Assessment Pain Scale: 0-10 Pain Score: 0-No pain ADL ADL Equipment Provided: Reacher, Sock aid Eating: Modified independent Grooming: Modified independent Where Assessed-Grooming: Sitting at sink Upper Body Bathing: Modified independent Where Assessed-Upper Body Bathing: Shower Lower Body Bathing: Modified independent Where Assessed-Lower Body Bathing: Shower Upper Body Dressing: Modified independent (Device) Where Assessed-Upper Body Dressing: Wheelchair Lower Body Dressing: Modified independent Where Assessed-Lower Body Dressing:  Wheelchair Toileting: Modified independent Where Assessed-Toileting: Bedside Commode, Toilet (BSC over toilet) Toilet Transfer: Modified independent Statistician Method: Proofreader: Grab bars Tub/Shower Transfer: Modified independent Web designer Method: Ship broker: Walk in Music therapist: Modified independent Film/video editor Method: Designer, industrial/product: Emergency planning/management officer, Grab bars Vision Baseline Vision/History: 1 Wears glasses Wears Glasses: Reading only Patient Visual Report: No change from baseline Vision Assessment?: Wears glasses for reading Perception  Perception: Within Functional Limits Praxis Praxis: WFL Cognition Cognition Overall Cognitive Status: Within Functional Limits for tasks assessed Arousal/Alertness: Awake/alert Orientation Level: Person;Place;Situation Person: Oriented Place: Oriented Situation: Oriented Memory: Appears intact Awareness: Appears intact Problem Solving: Appears intact Safety/Judgment: Appears intact Brief Interview for Mental Status (BIMS) Repetition of Three Words (First Attempt): 3 Temporal Orientation: Year: Correct Temporal Orientation: Month: Accurate within 5 days Temporal Orientation: Day: Correct Recall: Sock: Yes, no cue required Recall: Blue: Yes, no cue required Recall: Bed: Yes, no cue required BIMS Summary Score: 15 Sensation Sensation Light Touch: Impaired Detail Peripheral sensation comments: 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, hypersensitive Light Touch Impaired Details: Impaired RLE;Impaired LLE Hot/Cold: Appears Intact Proprioception: Appears Intact Stereognosis: Not tested Coordination Gross Motor Movements are Fluid and Coordinated: No Fine Motor Movements are Fluid and Coordinated: No Coordination and Movement Description: d/t debility and general weakness secondary MG and baseline neuropathy Motor   Motor Motor: Abnormal postural alignment and control Mobility  Bed Mobility Bed Mobility: Rolling Right;Rolling Left;Sit to Supine;Supine to Sit Rolling Right: Independent with assistive device Rolling Left: Independent with assistive device Supine to Sit: Independent with assistive device Sit to Supine: Independent with assistive device Transfers Sit to Stand: Independent with assistive device Stand to Sit: Independent with assistive device  Trunk/Postural Assessment  Cervical Assessment Cervical Assessment: Exceptions to [facility] (forwqard head) Thoracic Assessment Thoracic Assessment: Exceptions to [facility] (rounded shoulders) Lumbar Assessment Lumbar Assessment: Exceptions to [facility] (posterior  pelvi tilt) Postural Control Postural Control: Deficits on evaluation Righting Reactions: delayed  Balance Balance Balance Assessed: Yes Static Sitting Balance Static Sitting - Balance Support: Feet supported;Bilateral upper extremity supported Static Sitting - Level of Assistance: 6: Modified independent (Device/Increase time) Dynamic Sitting Balance Dynamic Sitting - Balance Support: During functional activity Dynamic Sitting - Level of Assistance: 6: Modified independent (Device/Increase time) Dynamic Sitting - Balance Activities: Forward lean/weight shifting Sitting balance - Comments: EOB/W-C; able to support without assist Static Standing Balance Static Standing - Balance Support: During functional activity Static Standing - Level of Assistance: 6: Modified independent (Device/Increase time) Dynamic Standing Balance Dynamic Standing - Balance Support: Bilateral upper extremity supported;During functional activity Dynamic Standing - Level of Assistance: 6: Modified independent (Device/Increase time) Dynamic Standing - Balance Activities: Forward lean/weight shifting;Reaching for objects Extremity/Trunk Assessment RUE Assessment RUE Assessment: Within Functional Limits Active Range of  Motion (AROM) Comments: WFL General Strength Comments: 4-/5 general weakness LUE Assessment LUE Assessment: Within Functional Limits Active Range of Motion (AROM) Comments: WFL General Strength Comments: 4-/5 general weakness  Tx session General: Pt seated in W/C upon OT arrival, agreeable to OT.  Pain: see above  ADL: OT instructing pt on use of sock aide for increased independence with LB ADLs for D/C. Pt able to complete using teach back method with SBA and increased time. Pt feeling confident about abilities to D/C tomorrow. OT issued HEP 9/1 and pt completed exercises with good form and understanding.   Pt seated in W/C at end of session with W/C alarm donned, call light within reach and 4Ps assessed.    Camie Hoe, OTD, OTR/L 09/21/2023, 3:58 PM

## 2023-09-21 NOTE — Progress Notes (Signed)
 Physical Therapy Session Note  Patient Details  Name: [name] MRN: [id number] Date of Birth: 09-20-1965  Today's Date: 09/21/2023 PT Individual Time: 1300-1345 PT Individual Time Calculation (min): 45 min   Short Term Goals: Week 1:  PT Short Term Goal 1 (Week 1): = LTG  Skilled Therapeutic Interventions/Progress Updates:      Pt seated in WC upon arrival. Pt agreeable to therapy. Pt denies any pain.   Pt denies any questions/concerns regarding discharge tomorrow.   Treatment session focused on stair navigation. Pt ascended 2x4 6 inch steps with heavy UE support on B handrails and step to gait with CGA/min A. Pt confirms handrails nor ramp will be installed prior to D/C tomorrow. Discussed plan for getting up/down stairs for home entry. Pt endorses pt will navigate steps with no handrails and unilateral HHA from pt sister. PT encouraged pt to trial stair navigation with unilateral handrail however pt refusing 2/2 fear of falling. Recommended non emergency medical transport. Notified Child psychotherapist.   Pt seated in WC at end of session with all needs within reach and chair alarm on.     Therapy Documentation Precautions:  Precautions Precautions: Fall Recall of Precautions/Restrictions: Intact Precaution/Restrictions Comments: oxygen  2L via [place] Required Braces or Orthoses: Knee Immobilizer - Right Knee Immobilizer - Right: On when out of bed or walking Restrictions Weight Bearing Restrictions Per Provider Order: No   Therapy/Group: Individual Therapy  [facility] [place], [place], DPT  09/21/2023, 1:03 PM

## 2023-09-21 NOTE — Progress Notes (Signed)
 RN notified PA in person of observed blood in toilet bowel after BM. Unable to assess stool d/t toilet paper.

## 2023-09-21 NOTE — Progress Notes (Addendum)
 Patient ID: [name], female   DOB: Aug 30, 1965, 58 y.o.   MRN: [id number]  Attempted to contact [name]-sister but her VM is full. Will try again later, due to questions from family training yesterday  10;18 am Spoke with [name] regarding questions regarding ramp and who she needs to call. Discussed can go through equipment company to rent or ask at church if they do them. PT can give the specifications for one if family member can build. They feel she would do better with a ramp instead of rails. Her other question is being able to be paid to be her caregiver, she will ask Medicaid worker regarding this. Aware discharge time between 9-11 and will be here tomorrow to go over DC instructions. Feels ready for her to come home has done well here.   10:51 PM Aware team conference today and has met her goals and is ready for discharge tomorrow.

## 2023-09-21 NOTE — Plan of Care (Signed)
  Problem: Consults Goal: RH GENERAL PATIENT EDUCATION Description: See Patient Education module for education specifics. Outcome: Progressing   Problem: RH BOWEL ELIMINATION Goal: RH STG MANAGE BOWEL WITH ASSISTANCE Description: STG Manage Bowel with mod I Assistance. Outcome: Progressing Goal: RH STG MANAGE BOWEL W/MEDICATION W/ASSISTANCE Description: STG Manage Bowel with Medication with mod I  Assistance. Outcome: Progressing   Problem: RH SAFETY Goal: RH STG ADHERE TO SAFETY PRECAUTIONS W/ASSISTANCE/DEVICE Description: STG Adhere to Safety Precautions With cues Assistance/Device. Outcome: Progressing   Problem: RH PAIN MANAGEMENT Goal: RH STG PAIN MANAGED AT OR BELOW PT'S PAIN GOAL Description: Pain < 4 with prns Outcome: Progressing   Problem: RH KNOWLEDGE DEFICIT GENERAL Goal: RH STG INCREASE KNOWLEDGE OF SELF CARE AFTER HOSPITALIZATION Description: Patient and sister will be able to manage care at discharge using educational resources for medications and dietary modification independently Outcome: Progressing   Problem: RH PAIN MANAGEMENT Goal: RH STG PAIN MANAGED AT OR BELOW PT'S PAIN GOAL Outcome: Progressing

## 2023-09-21 NOTE — Progress Notes (Signed)
 PROGRESS NOTE   Subjective/Complaints:  Pt reports alarm from pulse ox went off 3-4x overnight- it appears it back back up when she woke up, because I don't see it documented.   Still having intermittent vaginal bleeding- last time yesterday.  LBM Sunday but has to go right now.     ROS:  Per HPI  Pt denies SOB, abd pain, CP, N/V/C/D, and vision changes    Objective:   US  PELVIS LIMITED (TRANSABDOMINAL ONLY) Result Date: 09/19/2023 CLINICAL DATA:  Postmenopausal bleeding. EXAM: TRANSABDOMINAL ULTRASOUND OF PELVIS TECHNIQUE: Transabdominal ultrasound examination of the pelvis was performed including evaluation of the uterus, ovaries, adnexal regions, and pelvic cul-de-sac. COMPARISON:  None Available. FINDINGS: Uterus Measurements: 13.7 x 5.8 x 9.8 cm = volume: 110 mL. Multiple hypoechoic lesions measuring up to 8.9 x 6.4 x 5.2 cm. Endometrium Thickness: 11 mm, thickened in the setting of postmenopausal bleeding. No focal abnormality visualized. Right ovary Not visualized. Left ovary Not visualized. Other findings:  No abnormal free fluid. IMPRESSION: 1. Endometrial thickening. In the setting of post-menopausal bleeding, endometrial sampling is indicated to exclude carcinoma. If results are benign, sonohysterogram should be considered for focal lesion work-up. (Ref: Radiological Reasoning: Algorithmic Workup of Abnormal Vaginal Bleeding with Endovaginal Sonography and Sonohysterography. AJR 2008; [id number]:D31-26). 2. Uterine fibroids. Electronically Signed   By: Newell Eke M.D.   On: 09/19/2023 11:19     Recent Labs    09/19/23 1033 09/21/23 0517  WBC 9.6 6.8  HGB 9.4* 9.5*  HCT 32.3* 31.6*  PLT 150 146*     No results for input(s): NA, K, CL, CO2, GLUCOSE, BUN, CREATININE, CALCIUM  in the last 72 hours.    Intake/Output Summary (Last 24 hours) at 09/21/2023 0843 Last data filed at 09/21/2023 0808 Gross per  24 hour  Intake 120 ml  Output --  Net 120 ml        Physical Exam: Vital Signs Blood pressure (!) 117/54, pulse 69, temperature 98 F (36.7 C), resp. rate 18, height 5' 3 (1.6 m), weight 128.7 kg, SpO2 99%.      General: awake, alert, appropriate, BMI- 50.26; sitting up EOB finishing breakfast; NAD HENT: conjugate gaze; oropharynx dry CV: regular rate and rhythm; no JVD Pulmonary: wearing 2L O2 by NC_ off BiPAP- less wheezing today, but decreased throughout.  GI: soft, NT, ND, (+)BS- protuberant Psychiatric: appropriate- flat Neurological: Ox3   Skin: No evidence of breakdown, no evidence of rash Neurologic: Cranial nerves II through XII intact, motor strength is 4/5 in bilateral deltoid, bicep, tricep, grip, hip flexor, knee extensors, ankle dorsiflexor and plantar flexor Sensory exam normal sensation to light touch and proprioception in bilateral upper and lower extremities Cerebellar exam normal finger to nose to finger as well as heel to shin in bilateral upper and lower extremities Musculoskeletal: Full range of motion in all 4 extremities. No joint swelling    MSK: Wearing knee brace right knee    Assessment/Plan: 1. Functional deficits which require 3+ hours per day of interdisciplinary therapy in a comprehensive inpatient rehab setting. Physiatrist is providing close team supervision and 24 hour management of active medical problems listed below. Physiatrist and rehab team continue  to assess barriers to discharge/monitor patient progress toward functional and medical goals  Care Tool:  Bathing              Bathing assist Assist Level: Minimal Assistance - Patient > 75%     Upper Body Dressing/Undressing Upper body dressing   What is the patient wearing?: Dress    Upper body assist Assist Level: Supervision/Verbal cueing    Lower Body Dressing/Undressing Lower body dressing      What is the patient wearing?: Incontinence brief     Lower body  assist Assist for lower body dressing: Maximal Assistance - Patient 25 - 49%     Toileting Toileting    Toileting assist Assist for toileting: Minimal Assistance - Patient > 75%     Transfers Chair/bed transfer  Transfers assist  Chair/bed transfer activity did not occur: Safety/medical concerns  Chair/bed transfer assist level: Supervision/Verbal cueing     Locomotion Ambulation   Ambulation assist      Assist level: Supervision/Verbal cueing Assistive device: Walker-rolling Max distance: 220'   Walk 10 feet activity   Assist     Assist level: Supervision/Verbal cueing Assistive device: Walker-rolling   Walk 50 feet activity   Assist Walk 50 feet with 2 turns activity did not occur: Safety/medical concerns (too weak, unsafe)  Assist level: Supervision/Verbal cueing Assistive device: Walker-rolling    Walk 150 feet activity   Assist Walk 150 feet activity did not occur: Safety/medical concerns (too weak, unsafe)  Assist level: Supervision/Verbal cueing Assistive device: Walker-rolling    Walk 10 feet on uneven surface  activity   Assist Walk 10 feet on uneven surfaces activity did not occur: Safety/medical concerns (too weak, unsafe)         Wheelchair     Assist Is the patient using a wheelchair?: Yes Type of Wheelchair: Manual    Wheelchair assist level: Total Assistance - Patient < 25% Max wheelchair distance: >215'    Wheelchair 50 feet with 2 turns activity    Assist        Assist Level: Total Assistance - Patient < 25%   Wheelchair 150 feet activity     Assist      Assist Level: Total Assistance - Patient < 25%   Blood pressure (!) 117/54, pulse 69, temperature 98 F (36.7 C), resp. rate 18, height 5' 3 (1.6 m), weight 128.7 kg, SpO2 99%.  Medical Problem List and Plan: 1. Functional deficits secondary to debility secondary to exacerbation of myasthenia gravis.  Completed 5-day course of IVIG.  Continue  Mestinon  30 mg 5 times daily as well as CellCept              -patient may shower             -ELOS/Goals: 7-10 days, supervision PT, sup-min OT, supervision SLP  Con't CIR PT, OT and SLP  Team conference today to f/u on progress 2.  Antithrombotics: -DVT/anticoagulation:  Mechanical: Antiembolism stockings, thigh (TED hose) Bilateral lower extremities.  Venous Doppler studies negative 8/21- will start Lovenox  since at high risk for DVT/PE- and on no meds 8/22- moved dose to 60 mg per pharmacy 9/2- due to vaginal bleeding, lovenox  reduced to 40 mg daily- bleeding still occurring.              -antiplatelet therapy: N/A  3. 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 continue  Voltaren  gel 4 times daily -prn tylenol   -might benefit from brace with activity 8/20- pt asking to increase due to neuropathy, but explained we just increased Lyrica  8-24: Continue Voltaren  gel to right knee, scheduled Tylenol  1000 mg 3 times daily.   8/26 Ortho consult for knee injection, appreciate help 8/29- knee pain better 4. Mood/Behavior/Sleep: Provide emotional support             -antipsychotic agents: Abilify  2 mg daily 5. Neuropsych/cognition: This patient is capable of making decisions on her own behalf. 6. Skin/Wound Care: Routine skin checks 7. Fluids/Electrolytes/Nutrition: Routine in and outs with follow-up chemistries  8.  Acute on chronic respiratory failure with hypoxia and hypercarbia.  Extubated 8/10.  Continue BiPAP.  Continue nebulizers as directed.   -Patient on 2 L O2 nasal cannula prior to admission -keep HOB at 30 degrees 8/20- Pt's CO2 has been high - last checked 8 days prior on BMP- her CO2 is 40 today- 2 points above last check- will recheck in AM- probably need to reduce O2 since CO2 trapping and might need Pulmonary consult/ABG if no better in AM- will write to  keep O2 sats at ~ 93-96% and see if can reduce O2 usage. Reached out to nursing.    8-24: Persistent bilateral pleural effusions, some vascular prominence.  Start Lasix  20 mg daily.  Demonstrated ISB with patient and discussed compliance with 10 reps every 2 hours while awake  8/27 CXR ordered -cardiomegaly with vascular congestion/edema, small left pleural effusion with left lung base atelectasis or infiltrate.  Patient overall stable on 2 L [place], creatinine stable continue current Lasix  dose  8/31- hypoxia alternating with Hypocarbia, not sure if O2 sats are best measure in this pt and ABG is preferable although more invasive For O2 sats will need to be satisfied with sats >88 Asked CCM to do f/u today for parameters, mentation rather than pCO2 will guide bipap use   9/1- sats >90% overnight per nursing- has pulse ox- pt very upset didn't sleep due to loud beeping- d/w nursing to turn it down- and only wear when sleeping  9/2- pt reports doing much better with only beeping is from alarm- woken up 3-4x last night due to alarms, but resolved immediately.  9.  Community-acquired pneumonia.  Antibiotic course completed.  Check oxygen  saturations every shift   - stable  11.  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  Dietary follow-up  8/29- BMI down to 49.84- doing better 12.  Hypertension.  Cozaar  50 mg daily.  Lasix  20mg  daily Controlled  9/1- BP soft, but doing OK- no OH Sx's  9/2- BP doing better- con't regimen    09/21/2023    4:08 AM 09/20/2023    7:42 PM 09/20/2023    1:16 PM  Vitals with BMI  Weight 283 lbs 12 oz    BMI 50.27    Systolic 117 122 882  Diastolic 54 59 51  Pulse 69 80 90       13.  Hypothyroidism.  Synthroid  14.  Splenic laceration/hematoma.  Recommendations of supportive care per general surgery  15.  Normocytic anemia.  Follow-up CBC   8/28 stable at 10.1 hemoglobin  9/2- Hb 9.5- up from 9.4 16. Ear wax? Blocking R ear/  8/21- will order Debrox 5 drips BID for R ear x 4  days- see if Sx's improve  17. Globus sensation. Improved no issues today 18. Leukocytosis. Resolved    Latest Ref Rng & Units 09/21/2023  5:17 AM 09/19/2023   10:33 AM 09/16/2023    5:19 AM  CBC  WBC 4.0 - 10.5 K/uL 6.8  9.6  8.8   Hemoglobin 12.0 - 15.0 g/dL 9.5  9.4  89.8   Hematocrit 36.0 - 46.0 % 31.6  32.3  33.5   Platelets 150 - 400 K/uL 146  150  168     19.  Vaginal bleeding , will check STAT CBC , have conulted GYN, spoke with Dr Jayne who will order pelvic US  and see pt later today   9/1- Has 11mm endometrium- needs oupt Endometrial biopsy, per Gyn- have scheduled f/u- and started Megasterol 40 mg BID to stabilize endometrium- will recheck CBC tomorrow since Hb down to 9.4 from 10.1- and prior 10.7  9/2- Pt's Hb stabilized today to 9.5- but has dropped over 1 unit in last 1 week overall. Will recheck Thursday and if still dropping with call Gyn back   I spent a total of 36   minutes on total care today- >50% coordination of care- due to  D/w pt about Gyn and Hb- and reviewed labs.  Also placed lab orders  LOS: 14 days A FACE TO FACE EVALUATION WAS PERFORMED  Cuthbert Turton 09/21/2023, 8:43 AM

## 2023-09-21 NOTE — Progress Notes (Signed)
 Inpatient Rehabilitation Discharge Medication Review by a Pharmacist  A complete drug regimen review was completed for this patient to identify any potential clinically significant medication issues.  High Risk Drug Classes Is patient taking? Indication by Medication  Antipsychotic {Receiving?:26196}   Anticoagulant {Receiving?:26196}   Antibiotic {Receiving?:26196}   Opioid {Receiving?:26196}   Antiplatelet {Receiving?:26196}   Hypoglycemics/insulin  {Yes or No?:26198}   Vasoactive Medication {Receiving?:26196}   Chemotherapy {Receiving Chemo?:26197}   Other {Yes or No?:26198}      Type of Medication Issue Identified Description of Issue Recommendation(s)  Drug Interaction(s) (clinically significant)     Duplicate Therapy     Allergy     No Medication Administration End Date     Incorrect Dose     Additional Drug Therapy Needed     Significant med changes from prior encounter (inform family/care partners about these prior to discharge).  Restart or discontinue as appropriate. Communicate medication changes with patient/family at discharge  Other       Clinically significant medication issues were identified that warrant physician communication and completion of prescribed/recommended actions by midnight of the next day:  {Yes or No?:26198}  Name of provider notified for urgent issues identified: ***   Provider Method of Notification: ***    Pharmacist comments: ***   Time spent performing this drug regimen review (minutes): 30   Thank you for allowing pharmacy to be a part of this patient's care.   Bascom JAYSON Louder, PharmD 09/21/2023 2:06 PM     **Pharmacist phone directory can be found on [email or web address] listed under [facility]**

## 2023-09-21 NOTE — Progress Notes (Signed)
 Physical Therapy Discharge Summary  Patient Details  Name: [name] MRN: [id number] Date of Birth: 06-05-1965  Date of Discharge from PT service:September 21, 2023  Today's Date: 09/21/2023 PT Individual Time:  830-930 PT Total Time: 60 min      Patient has met 10 of 11 long term goals due to improved activity tolerance, improved balance, improved postural control, increased strength, improved awareness, and improved coordination.  Patient to discharge at an ambulatory level Supervision.   Patient's care partner is independent to provide the necessary physical assistance at discharge.  Reasons goals not met: Right knee pain, bilateral neuropathy of feet limiting functional tolerance  Recommendation:  Patient will benefit from ongoing skilled PT services in home health setting to continue to advance safe functional mobility, address ongoing impairments in gait, strength, and minimize fall risk.  Equipment: RW  Reasons for discharge: treatment goals met and discharge from hospital  Patient/family agrees with progress made and goals achieved: Yes  PT Discharge Precautions/Restrictions Precautions Precautions: Fall Recall of Precautions/Restrictions: Intact Precaution/Restrictions Comments: oxygen  2L via [place] Required Braces or Orthoses: Knee Immobilizer - Right Knee Immobilizer - Right: On when out of bed or walking Restrictions Weight Bearing Restrictions Per Provider Order: No, Oxygen  use, fall risk  Pain Pain Assessment Pain Scale: 0-10 Pain Score: 7/10 with activity   Pain Interference Pain Interference Pain Effect on Sleep: 1. Rarely or not at all Pain Interference with Therapy Activities: 1. Rarely or not at all Pain Interference with Day-to-Day Activities: 1. Rarely or not at all     RLE Assessment RLE Assessment: Within Functional Limits General Strength Comments: grossly 4+/5 LLE Assessment LLE Assessment: Within Functional Limits General Strength  Comments: grossly 4+/5  Vision/Perception  Vision - History Ability to See in Adequate Light: 0 Adequate Perception Perception: Within Functional Limits Praxis Praxis: WFL  Cognition  Overall Cognitive Status: Within Functional Limits for tasks assessed Arousal/Alertness: Awake/alert Orientation Level: Oriented X4 Memory: Appears intact Awareness: Appears intact Problem Solving: Appears intact Safety/Judgment: Appears intact Sensation Sensation: Impaired Light Touch: 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, hypersensitive  Hot/Cold: Appears Intact Proprioception: Appears Intact Coordination Gross Motor Movements are Fluid and Coordinated: No Fine Motor Movements are Fluid and Coordinated: Yes Motor  Motor: Abnormal posterior alignment and control Coordination and Movement: d/t debility and general weakness secondary MG and baseline neuropathy  Mobility Bed Mobility Bed Mobility: Rolling Right;Rolling Left;Right Sidelying to Sit;Supine to Sit;Sit to Supine;Scooting to [facility] Rolling Right: Independent with assistive device  Rolling Left: Independent with assistive device  Right Sidelying to Sit: Independent with assistive device  Supine to Sit: Independent with assistive device  Sit to Supine: Independent with assistive device  Scooting to [facility]: Independent with assistive device  Transfers Transfers: Sit to Stand;Stand to Sit;Stand Pivot Transfers Sit to Stand: Independent with assistive device  Stand to Sit: Independent with assistive device  Stand Pivot Transfers: Independent with assistive device  Transfer (Assistive device): Rolling walker Locomotion  Gait Ambulation: Yes Gait Assistance: Independent with assistive device  Assistive device: Rolling walker Gait Gait: Yes Gait Pattern: Shuffle; Wide base of support  Gait velocity: decr Stairs / Additional Locomotion Stairs: Yes Stairs Assistance: Contact Guard Stair Management Technique: Two rails Number of Stairs:  8 Height of Stairs: 6 Ramp: Independent with assistive device  Curb: Independent with assistive device  Wheelchair Mobility Wheelchair Mobility: Yes Trunk/Postural Assessment  Cervical Assessment Cervical Assessment: [facility] Thoracic Assessment Thoracic Assessment:  [facility] Lumbar Assessment Lumbar Assessment:  WFL Postural Control Postural  Control: WFL Righting Reactions: Delayed Protective Responses: still somewhat delayed although better since eval  Balance   Balance Balance Assessed: Yes Static Sitting Balance Static Sitting - Balance Support: Feet supported Static Sitting - Level of Assistance: 6: Modified independent (Device/Increase time) Dynamic Sitting Balance Dynamic Sitting - Balance Support: Feet supported Dynamic Sitting - Level of Assistance: 6: Modified independent (Device/Increase time) Sitting balance - Comments: able to static sit EOB without UB support Static Standing Balance Static Standing - Balance Support: During functional activity;Bilateral upper extremity supported Static Standing - Level of Assistance: Modified Independent (Device/Increase time) Dynamic Standing Balance Dynamic Standing - Balance Support: During functional activity Dynamic Standing - Level of Assistance: Modified Independent (Device/Increase time) Dynamic Standing - Comments: Extremity Assessment  RLE Assessment RLE Assessment: Within Functional Limits General Strength Comments: grossly 4+/5 LLE Assessment LLE Assessment: Within Functional Limits General Strength Comments: grossly 4+/5  [name] Fast 09/21/2023, 12:42 PM

## 2023-09-21 NOTE — Progress Notes (Signed)
 Patient ID: [name], female   DOB: 12/19/1965, 58 y.o.   MRN: [id number]  PTAR transportation set up fro 10:30am 09/22/2023. [name] made aware.

## 2023-09-21 NOTE — Patient Care Conference (Signed)
 Inpatient RehabilitationTeam Conference and Plan of Care Update Date: 09/21/2023   Time: 1200 pm    Patient Name: [name]      Medical Record Number: [id number]  Date of Birth: Jun 11, 1965 Sex: Female         Room/Bed: 4M11C/4M11C-01 Payor Info: Payor: [employer] MEDICARE / Plan: [employer] MEDICARE HMO / Product Type: *No Product type* /    Admit Date/Time:  09/07/2023  2:15 PM  Primary Diagnosis:  Myasthenia gravis [facility])  Hospital Problems: Principal Problem:   Myasthenia gravis (HCC) Active Problems:   Leukocytosis    Expected Discharge Date: Expected Discharge Date: 09/22/23  Team Members Present: Physician leading conference: Dr. Duwaine Barrs Social Worker Present: Rhoda Clement, LCSW Nurse Present: Eulalio Falls, RN PT Present: Other (comment) Lessie MIso, PT) OT Present: Camie Hoe, OT PPS Coordinator present : Eleanor Colon, SLP     Current Status/Progress Goal Weekly Team Focus  Bowel/Bladder      Incontinent of bladder at times Continent of bowels          Swallow/Nutrition/ Hydration               ADL's   mod I with AD with ADLs and transfers with increased time, using RW   mod I   D/C planning    Mobility      Met all goals for mobility   Mod I    D/c planning  Communication                Safety/Cognition/ Behavioral Observations               Pain      Knee pain   <4 w/ prns     Assess pain q shift  Skin      No skin issues   Skin free of infection entire stay on rehab    Assess skin q shift    Discharge Planning:  Multiple family members here for education and are aware of pt's care needs. DME ordered and follow up being arranged. Aware of supervision-mod/i level goals    Team Discussion: Patient was admitted with debility secondary to exacerbation of myasthenia gravis. Patient's knee pain is better: medication adjusted by MD. Patient with vaginal bleeding: follow up with GYN after discharge.    Patient on target  to meet rehab goals: yes, currently patient mod I with AD with ADLs . Patient transfers with Mod I assistance using a RW. Overall goals at discharge are set for Mod I assistance.  *See Care Plan and progress notes for long and short-term goals.   Revisions to Treatment Plan:  N/a   Teaching Needs: Safety, medications, transfers, toileting, etc   Current Barriers to Discharge: Decreased caregiver support, Home enviroment access/layout, Incontinence, Weight, and home oxygen   Possible Resolutions to Barriers: Family Education PTAR transportation DME: Hospital bed, RW,TTB Home health follow up     Medical Summary Current Status: still has urinary inconitnence intermittently- vaginal bleeding- knee pain better  Barriers to Discharge: Symptomatic Anemia;Self-care education;Other (comments)  Barriers to Discharge Comments: endometrial stripe is 11mm- is post menopausal vaginal bleeding-  endurance, and Home O2 Possible Resolutions to [employer] Focus: met goals- will need f/u wiyth Gyn for porbable hysterectomy- d/c 9/3   Continued Need for Acute Rehabilitation Level of Care: The patient requires daily medical management by a physician with specialized training in physical medicine and rehabilitation for the following reasons: Direction of a multidisciplinary physical rehabilitation program to maximize functional independence :  Yes Medical management of patient stability for increased activity during participation in an intensive rehabilitation regime.: Yes Analysis of laboratory values and/or radiology reports with any subsequent need for medication adjustment and/or medical intervention. : Yes   I attest that I was present, lead the team conference, and concur with the assessment and plan of the team.   [name] 09/21/2023, 1200 pm

## 2023-09-21 NOTE — Plan of Care (Signed)
  Problem: RH Balance Goal: LTG Patient will maintain dynamic standing with ADLs (OT) Description: LTG:  Patient will maintain dynamic standing balance with assist during activities of daily living (OT)  Outcome: Completed/Met Flowsheets (Taken 09/08/2023 1235) LTG: Pt will maintain dynamic standing balance during ADLs with: Independent with assistive device   Problem: RH Grooming Goal: LTG Patient will perform grooming w/assist,cues/equip (OT) Description: LTG: Patient will perform grooming with assist, with/without cues using equipment (OT) Outcome: Completed/Met Flowsheets (Taken 09/08/2023 1235) LTG: Pt will perform grooming with assistance level of: Independent with assistive device    Problem: RH Bathing Goal: LTG Patient will bathe all body parts with assist levels (OT) Description: LTG: Patient will bathe all body parts with assist levels (OT) Outcome: Completed/Met Flowsheets (Taken 09/08/2023 1235) LTG: Pt will perform bathing with assistance level/cueing: Independent with assistive device    Problem: RH Dressing Goal: LTG Patient will perform upper body dressing (OT) Description: LTG Patient will perform upper body dressing with assist, with/without cues (OT). Outcome: Completed/Met Flowsheets (Taken 09/08/2023 1235) LTG: Pt will perform upper body dressing with assistance level of: Independent with assistive device Goal: LTG Patient will perform lower body dressing w/assist (OT) Description: LTG: Patient will perform lower body dressing with assist, with/without cues in positioning using equipment (OT) Outcome: Completed/Met Flowsheets (Taken 09/08/2023 1235) LTG: Pt will perform lower body dressing with assistance level of: Independent with assistive device   Problem: RH Toileting Goal: LTG Patient will perform toileting task (3/3 steps) with assistance level (OT) Description: LTG: Patient will perform toileting task (3/3 steps) with assistance level (OT)  Outcome:  Completed/Met Flowsheets (Taken 09/08/2023 1235) LTG: Pt will perform toileting task (3/3 steps) with assistance level: Independent with assistive device   Problem: RH Toilet Transfers Goal: LTG Patient will perform toilet transfers w/assist (OT) Description: LTG: Patient will perform toilet transfers with assist, with/without cues using equipment (OT) Outcome: Completed/Met Flowsheets (Taken 09/08/2023 1235) LTG: Pt will perform toilet transfers with assistance level of: Independent with assistive device   Problem: RH Tub/Shower Transfers Goal: LTG Patient will perform tub/shower transfers w/assist (OT) Description: LTG: Patient will perform tub/shower transfers with assist, with/without cues using equipment (OT) Outcome: Completed/Met Flowsheets (Taken 09/08/2023 1235) LTG: Pt will perform tub/shower stall transfers with assistance level of: Independent with assistive device   Problem: RH Furniture Transfers Goal: LTG Patient will perform furniture transfers w/assist (OT/PT) Description: LTG: Patient will perform furniture transfers  with assistance (OT/PT). Outcome: Completed/Met Flowsheets (Taken 09/08/2023 1556 by Pinkie Arland RAMAN, PT) LTG: Pt will perform furniture transfers with assist:: Independent with assistive device

## 2023-09-22 NOTE — Progress Notes (Signed)
 PROGRESS NOTE   Subjective/Complaints:  Pt reports LBM yesterday.   Has gained a few lbs since here- is 125.8 kg today No issues acutely this Am- we discussed need for endometrial biopsy.     ROS:  Per HPI   Pt denies SOB, abd pain, CP, N/V/C/D, and vision changes   Objective:   No results found.    Recent Labs    09/19/23 1033 09/21/23 0517  WBC 9.6 6.8  HGB 9.4* 9.5*  HCT 32.3* 31.6*  PLT 150 146*     No results for input(s): NA, K, CL, CO2, GLUCOSE, BUN, CREATININE, CALCIUM  in the last 72 hours.    Intake/Output Summary (Last 24 hours) at 09/22/2023 0807 Last data filed at 09/21/2023 1816 Gross per 24 hour  Intake 420 ml  Output --  Net 420 ml        Physical Exam: Vital Signs Blood pressure (!) 130/54, pulse 72, temperature 97.6 F (36.4 C), resp. rate 18, height 5' 3 (1.6 m), weight 125.8 kg, SpO2 98%.         General: awake, alert, appropriate, BMI 49.13; supine- o BiPAP- transferred to O2 by [place], NAD HENT: dysconjugate gaze; oropharynx dry-  CV: regular rate and rhythm; no JVD Pulmonary: decreased throughout- mild wheezing diffuse GI: soft, NT, ND, (+)BS- normoactive Psychiatric: appropriate- interactive Neurological: Ox3  Skin: No evidence of breakdown, no evidence of rash Neurologic: Cranial nerves II through XII intact, motor strength is 4/5 in bilateral deltoid, bicep, tricep, grip, hip flexor, knee extensors, ankle dorsiflexor and plantar flexor Sensory exam normal sensation to light touch and proprioception in bilateral upper and lower extremities Cerebellar exam normal finger to nose to finger as well as heel to shin in bilateral upper and lower extremities Musculoskeletal: Full range of motion in all 4 extremities. No joint swelling    MSK: Wearing knee brace right knee    Assessment/Plan: 1. Functional deficits which require 3+ hours per day of  interdisciplinary therapy in a comprehensive inpatient rehab setting. Physiatrist is providing close team supervision and 24 hour management of active medical problems listed below. Physiatrist and rehab team continue to assess barriers to discharge/monitor patient progress toward functional and medical goals  Care Tool:  Bathing              Bathing assist Assist Level: Independent with assistive device     Upper Body Dressing/Undressing Upper body dressing   What is the patient wearing?: Dress    Upper body assist Assist Level: Independent with assistive device    Lower Body Dressing/Undressing Lower body dressing      What is the patient wearing?: Incontinence brief     Lower body assist Assist for lower body dressing: Independent with assitive device Assistive Device Comment: with reacher   Toileting Toileting    Toileting assist Assist for toileting: Independent with assistive device     Transfers Chair/bed transfer  Transfers assist  Chair/bed transfer activity did not occur: Safety/medical concerns  Chair/bed transfer assist level: Independent with assistive device Chair/bed transfer assistive device: Geologist, engineering   Ambulation assist      Assist level: Independent with assistive device  Assistive device: Walker-rolling Max distance: 215'   Walk 10 feet activity   Assist     Assist level: Independent with assistive device Assistive device: Walker-rolling   Walk 50 feet activity   Assist Walk 50 feet with 2 turns activity did not occur: Safety/medical concerns (too weak, unsafe)  Assist level: Independent with assistive device Assistive device: Walker-rolling    Walk 150 feet activity   Assist Walk 150 feet activity did not occur: Safety/medical concerns (too weak, unsafe)  Assist level: Supervision/Verbal cueing Assistive device: Walker-rolling    Walk 10 feet on uneven surface  activity   Assist Walk 10  feet on uneven surfaces activity did not occur: Safety/medical concerns (too weak, unsafe)   Assist level: Contact Guard/Touching assist Assistive device: Walker-rolling   Wheelchair     Assist Is the patient using a wheelchair?: Yes Type of Wheelchair: Manual    Wheelchair assist level: Total Assistance - Patient < 25% Max wheelchair distance: >215'    Wheelchair 50 feet with 2 turns activity    Assist        Assist Level: Total Assistance - Patient < 25%   Wheelchair 150 feet activity     Assist      Assist Level: Total Assistance - Patient < 25%   Blood pressure (!) 130/54, pulse 72, temperature 97.6 F (36.4 C), resp. rate 18, height 5' 3 (1.6 m), weight 125.8 kg, SpO2 98%.  Medical Problem List and Plan: 1. Functional deficits secondary to debility secondary to exacerbation of myasthenia gravis.  Completed 5-day course of IVIG.  Continue Mestinon  30 mg 5 times daily as well as CellCept              -patient may shower             -ELOS/Goals: 7-10 days, supervision PT, sup-min OT, supervision SLP  D/c today- needs f/u with Gyn - will not need f/u with me.  2.  Antithrombotics: -DVT/anticoagulation:  Mechanical: Antiembolism stockings, thigh (TED hose) Bilateral lower extremities.  Venous Doppler studies negative 8/21- will start Lovenox  since at high risk for DVT/PE- and on no meds 8/22- moved dose to 60 mg per pharmacy 9/2- due to vaginal bleeding, lovenox  reduced to 40 mg daily- bleeding still occurring.              -antiplatelet therapy: N/A  3. 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 continue Voltaren  gel 4 times daily -prn tylenol   -might benefit from brace with activity 8/20- pt asking to increase due to neuropathy, but explained we just increased Lyrica  8-24: Continue Voltaren  gel to right knee, scheduled Tylenol  1000  mg 3 times daily.   8/26 Ortho consult for knee injection, appreciate help 8/29- knee pain better 4. Mood/Behavior/Sleep: Provide emotional support             -antipsychotic agents: Abilify  2 mg daily 5. Neuropsych/cognition: This patient is capable of making decisions on her own behalf. 6. Skin/Wound Care: Routine skin checks 7. Fluids/Electrolytes/Nutrition: Routine in and outs with follow-up chemistries  8.  Acute on chronic respiratory failure with hypoxia and hypercarbia.  Extubated 8/10.  Continue BiPAP.  Continue nebulizers as directed.   -Patient on 2 L O2 nasal cannula prior to admission -keep HOB at 30 degrees 8/20- Pt's CO2 has been high - last checked 8 days prior on BMP- her CO2 is 40  today- 2 points above last check- will recheck in AM- probably need to reduce O2 since CO2 trapping and might need Pulmonary consult/ABG if no better in AM- will write to keep O2 sats at ~ 93-96% and see if can reduce O2 usage. Reached out to nursing.    8-24: Persistent bilateral pleural effusions, some vascular prominence.  Start Lasix  20 mg daily.  Demonstrated ISB with patient and discussed compliance with 10 reps every 2 hours while awake  8/27 CXR ordered -cardiomegaly with vascular congestion/edema, small left pleural effusion with left lung base atelectasis or infiltrate.  Patient overall stable on 2 L [place], creatinine stable continue current Lasix  dose  8/31- hypoxia alternating with Hypocarbia, not sure if O2 sats are best measure in this pt and ABG is preferable although more invasive For O2 sats will need to be satisfied with sats >88 Asked CCM to do f/u today for parameters, mentation rather than pCO2 will guide bipap use   9/1- sats >90% overnight per nursing- has pulse ox- pt very upset didn't sleep due to loud beeping- d/w nursing to turn it down- and only wear when sleeping  9/2- pt reports doing much better with only beeping is from alarm- woken up 3-4x last night due to alarms, but  resolved immediately.   9/3- woken up less due to alarms- per nursing, didn't drop 9.  Community-acquired pneumonia.  Antibiotic course completed.  Check oxygen  saturations every shift   - stable  11.  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  Dietary follow-up  8/29- BMI down to 49.84- doing better 12.  Hypertension.  Cozaar  50 mg daily.  Lasix  20mg  daily Controlled  9/1- BP soft, but doing OK- no OH Sx's  9/2- BP doing better- con't regimen    09/22/2023    5:04 AM 09/22/2023    3:52 AM 09/21/2023    7:34 PM  Vitals with BMI  Weight 277 lbs 5 oz    BMI 49.14    Systolic  130 120  Diastolic  54 54  Pulse  72 72       13.  Hypothyroidism.  Synthroid  14.  Splenic laceration/hematoma.  Recommendations of supportive care per general surgery  15.  Normocytic anemia.  Follow-up CBC   8/28 stable at 10.1 hemoglobin  9/2- Hb 9.5- up from 9.4 16. Ear wax? Blocking R ear/  8/21- will order Debrox 5 drips BID for R ear x 4 days- see if Sx's improve  17. Globus sensation. Improved no issues today 18. Leukocytosis. Resolved    Latest Ref Rng & Units 09/21/2023    5:17 AM 09/19/2023   10:33 AM 09/16/2023    5:19 AM  CBC  WBC 4.0 - 10.5 K/uL 6.8  9.6  8.8   Hemoglobin 12.0 - 15.0 g/dL 9.5  9.4  89.8   Hematocrit 36.0 - 46.0 % 31.6  32.3  33.5   Platelets 150 - 400 K/uL 146  150  168     19.  Vaginal bleeding , will check STAT CBC , have conulted GYN, spoke with Dr Jayne who will order pelvic US  and see pt later today   9/1- Has 11mm endometrium- needs oupt Endometrial biopsy, per Gyn- have scheduled f/u- and started Megasterol 40 mg BID to stabilize endometrium- will recheck CBC tomorrow since Hb down to 9.4 from 10.1- and prior 10.7  9/2- Pt's Hb stabilized today to 9.5- but has dropped over 1 unit in last 1 week overall. Will recheck Thursday  and if still dropping with call Gyn back  9/3- had long d/w pt about needing an endometrial biopsy ASAP- and probable need for hysterectomy- she's been advised  to make Gyn appt- per Gyn, they placed referral, but not clear if actually has appt or not- but d/w PA- he will give pt number to call.   I spent a total of  43  minutes on total care today- >50% coordination of care- due to  D/w pt about endometrial stripe enlargement  The patient is medically ready for discharge to home and will not need follow-up with [facility] PM&R. In addition, they will need to follow up with their PCP, Gyn and Neurology. For Myasthenia gravis.     LOS: 15 days A FACE TO FACE EVALUATION WAS PERFORMED  Ronold Hardgrove 09/22/2023, 8:07 AM

## 2023-09-22 NOTE — Progress Notes (Signed)
 Inpatient Rehabilitation Care Coordinator Discharge Note   Patient Details  Name: [name] MRN: [id number] Date of Birth: 07-11-1965   Discharge location: HOME WITH SISTER AND HAS SIX OTHER SISTER'S TO ASSIST. WILL HAVE 24/7 CARE  Length of Stay: 15 DAYS  Discharge activity level: CGA-MIN LEVEL  Home/community participation: HOMEBOUND  Patient response un:Yzjouy Literacy - How often do you need to have someone help you when you read instructions, pamphlets, or other written material from your doctor or pharmacy?: Rarely  Patient response un:Dnrpjo Isolation - How often do you feel lonely or isolated from those around you?: Rarely  Services provided included: MD, RD, PT, OT, RN, CM, TR, Pharmacy, SW  Financial Services:  Field seismologist Utilized: Scientific laboratory technician MEDICARE  Choices offered to/list presented to: PT AND SISTER  Follow-up services arranged:  Home Health, DME, Patient/Family has no preference for HH/DME agencies Home Health Agency: [facility]  PT & OT    DME : [facility], BEDSIDE COM[facility] AND TUB [facility]ENCH    Patient response to transportation need: Is the patient able to respond to transportation needs?: Yes In the past 12 months, has lack of transportation kept you from medical appointments or from getting medications?: No In the past 12 months, has lack of transportation kept you from meetings, work, or from getting things needed for daily living?: No   Patient/Family verbalized understanding of follow-up arrangements:  Yes  Individual responsible for coordination of the follow-up plan: [name]-SISTER [phone]  Confirmed correct DME delivered: Raymonde Asberry MATSU 09/22/2023    Comments (or additional information):ALL SIX SISTER;S WERE HERE FOR EDUCATION WORKING ON RAMP FOR HOME DUE TO STAIRS ARE AN ISSUE FOR PT. PT SEEMS TO REQUIRE MORE ASSIST WHEN SISTER'S PRESENT.  Summary of Stay    Date/Time  Discharge Planning CSW  09/21/23 0848 Multiple family members here for education and are aware of pt's care needs. DME ordered and follow up being arranged. Aware of supervision-mod/i level goals RGD  09/14/23 0859 HOme with sister who can assist and has other sister's who will be checking in on her. Will need team's recommendations RGD       Ebin Palazzi, Asberry MATSU

## 2023-09-22 NOTE — Progress Notes (Signed)
 Recreational Therapy Discharge Summary Patient Details  Name: ADELYN ROSCHER MRN: [id number] Date of Birth: Jan 01, 1966 Today's Date: 09/22/2023  Comments on progress toward goals: Pt has made great progress during LOS and discharged home today with family to provide supervision.  TR sessions focused on pt education in regards to leisure education, activity analysis/modifications, stress management/coping and leisure participation (dance group) focused on activity tolerance, UE/LE exercise at supervision level.  Reasons for discharge: discharge from hospital  Follow-up: Home Health  Patient/family agrees with progress made and goals achieved: Yes  Milla Wahlberg 09/22/2023, 1:33 PM

## 2023-09-29 ENCOUNTER — Encounter: Attending: Physical Medicine and Rehabilitation | Admitting: Physical Medicine and Rehabilitation

## 2023-10-01 NOTE — Progress Notes (Addendum)
 Addendum: Sleep study completed October 12-13 which showed complex sleep apnea requiring BIPAP to get best correction. Hypercapnia improved with bipap, but not completely resolved. Required 0.5L bled in oxygen  to treat hypoxia during sleep. Ordering bipap through [facility].      ASSESSMENT/PLAN: Assessment & Plan Myasthenia gravis with recent exacerbation and impaired mobility Recent exacerbation with prolonged hospitalization, rehab stay, with resulting worsened impaired mobiity.  Insurance may cover lift chair motor, but chair itself may incur costs. Neurology appointment at [facility] needs expediting. Had attempted to move to [facility] due to proximity, but not able to been seen until Feb. Her sisters are helping with ADLs and IADLs and plan to help with coordination of appointments. - Document need for lift chair for insurance coverage. See below. - Coordinate with insurance and company for lift chair acquisition. - Attempt to expedite neurology appointment at [facility].  Obstructive sleep apnea with suboptimal CPAP therapy Current CPAP machine inadequate. Per sister is broken and they did some refurbishment. Additionally order for new CPAP given 9/4. However, on review, we have been working towards repeat sleep study since 2023. Additionally, it appears on review of her hospital and rehab stay that cpap may not have been controlling her symptoms. Also difficulty with tolerating bipap in the past. Sleep study needed urgently. Since the machine stopped working she has been having more episodic somnolence.  - Order sleep study to evaluate appropriate respiratory support. - Coordinate with [facility] CPAP team for new machine acquisition. - Ensure sleep study scheduling and follow-up.  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  EMG necessary for accurate diagnosis despite her apprehension. Initial order from 10/2022, will put in new order. Her sisters will help  coordinate scheduling.  - Reorder EMG to assess neuropathy. - Encourage her to proceed with EMG for accurate diagnosis.  Chronic right knee pain with effusion Chronic pain with effusion. Previous steroid injection ineffective (done 09/13/23 while at [facility] hosp/rehab). Specialist referral needed. - Refer to sports medicine or orthopedic specialist within [facility] system.  Obesity in the context of sleep apnea and prior anti-obesity pharmacotherapy Obesity management complicated by sleep apnea. Wegovy no longer covered by Medicaid as of 10/1. Plan to switch to Zepbound, covered by Medicare for OSA. - Attempt to obtain Zepbound coverage through Medicare. - Start Zepbound at lowest dose once approved. - Utilize remaining Wegovy doses strategically with Zepbound (has 1mg  at home).  Glaucoma, follow-up needed Follow-up needed. Last eye exam in 2022. Local eye doctor required for follow-up. - Find local eye doctor for glaucoma follow-up. - Schedule eye exam to assess current status.  Acute Kidney Injury BMP today resulted after visit which showed new AKI Cr 0.64 near discharge to 1.64. On review of chart and discussion with patient family and review of transitions visit. Losartan  held during hospitalization/rehab due to soft blood pressures. Furosemide  was given. When patient went home they restarted losartan  and did not continue furosemide . At transitions visit, which was just 1 day after discharge, her BP was normal and thus decision made to continue losartan  and stop furosemide  as no other indication noted.  -spoke to patient and sister to STOP losartan , monitor home BP and we will plan to do lab only visit next week to evaluate for improvement in AKI.  -patient is urinating regularly at time of these discussions. -reviewed rest of med list and lyrica  should be reduced to <300 total in setting of this AKI. Having her take just her morning lyrica   200mg  until resolved.   Assessment &  Plan Myasthenia gravis    (CMS-HCC)    Orders: .  Ambulatory referral to Neurology; Future .  CBC w/ Differential; Future .  Basic Metabolic Panel; Future .  Home Medical Equipment  Neuropathic pain  A recliner with lift chair mechanism is medically necessary because all of the following are true: 1. The patient has a severe neuromuscular disease, namely myasthenia gravis. 2. A lift chair is part of this patient's course of treatment and is prescribed to arrest or retard deterioration of the patient's condition. 3. Physical therapy (see note from recent hospitalization and her rehabilitation stay) has been attempted but has failed to facilitate patient transfer from a chair to a standing position. 4. The patient is completely incapable of standing up from any chair in the home. 5. Once standing, the patient is able to ambulate without the aid of a wheelchair or POV.   Orders: .  Home Medical Equipment  Need for immunization against influenza  Orders: .  INFLUENZA VACCINE IIV3(IM)(PF)6 MOS UP  OSA on CPAP  Orders: .  Polysomnography (with CPAP); Future .  tirzepatide (ZEPBOUND) 2.5 mg/0.5 mL injection pen; Inject 0.5 mL (2.5 mg total) under the skin every seven (7) days.  Pyridoxine  deficiency  Orders: [name]  Vitamin B6; Future  Class 3 severe obesity with body mass index (BMI) of 50.0 to 59.9 in adult, unspecified obesity type, unspecified whether serious comorbidity present (CMS-HCC)    Orders: .  tirzepatide (ZEPBOUND) 2.5 mg/0.5 mL injection pen; Inject 0.5 mL (2.5 mg total) under the skin every seven (7) days.   HEALTH MAINTENANCE ITEMS STILL DUE: Health Maintenance Due  Topic Date Due  . DTaP/Tdap/Td Vaccines (1 - Tdap) Never done  . Zoster Vaccines (1 of 2) Never done  . Mammogram  01/24/2021  . Colon Cancer Screening  08/01/2023  . COVID-19 Vaccine (5 - Mixed Product risk 2024-25 season) 09/20/2023  . Lipid Screening  12/29/2023     No future  appointments.   I personally spent 80 minutes face-to-face and non-face-to-face in the care of this patient, which includes all pre, intra, and post visit E/M time on the date of service. All documented time was specific to the E/M visit and does not include any pre, intra, post procedure related time.    Chief Complaint  Patient presents with  . Follow-up    -with continuing bilateral legs issues   . Referral Setup    -mammogram     SUBJECTIVE: History of Present Illness [name] is a 58 year old female with myasthenia gravis who presents with mobility issues and pain management. She is accompanied by her sisters, [place] and [place].  She has been experiencing significant mobility issues since returning home from a rehabilitation hospital. She has difficulty standing for more than five seconds and requires assistance to stand and pivot. She uses a hospital bed and has two walkers at home, one of which allows her to sit when tired. Despite these aids, she finds it challenging to walk around her home.  She experiences persistent pain in her knees and feet. Her knee pain is constant and present regardless of her activity level, according to her and her family. She reports that the knee pain feels different from the pain in her feet and lower legs. She had fluid removed from her knee and received a steroid injection on September 14, 2023, but according to her and her family, these interventions did not  improve her symptoms.  She has a history of myasthenia gravis with recent exacerbations, including a hospitalization due to a myasthenia gravis crisis. She uses a CPAP machine at home but reports issues with the pressure settings, which are not providing adequate respiratory support. She also uses supplemental oxygen , particularly when resting during the day.  She has been off wegovy an injection medication, for some time. She also has a history of neuropathy, which has been worsening,  causing increased pain and swelling in her feet. She has been hesitant to undergo an EMG test due to fear of the procedure.  She has a history of sleep apnea and has been experiencing issues with her CPAP machine, which she finds uncomfortable and insufficient. She has been experiencing issues with her vision and was advised to have her eyes checked for glaucoma. She has not had an eye exam since 2022 and is due for a follow-up.           Review of Systems as above  Ms. Kittleson  reports that she quit smoking about 3 years ago. Her smoking use included cigarettes. She started smoking about 23 years ago. She has a 5 pack-year smoking history. She has never used smokeless tobacco.  OBJECTIVE:  Ms. Mule  height is 152.4 cm (5') and weight is 138.3 kg (305 lb) (abnormal). Her temporal temperature is 36.7 C (98 F). Her blood pressure is 101/53 and her pulse is 93.  Wt Readings from Last 3 Encounters:  10/01/23 (!) 138.3 kg (305 lb)  07/30/23 (!) 134.6 kg (296 lb 12.8 oz)  04/12/23 (!) 134.9 kg (297 lb 6.4 oz)    PHQ-9 PHQ-9 Total Score  07/30/2023 10:30 AM 2  03/08/2023 10:45 AM 10 *  01/25/2023 11:15 AM 22 *  12/23/2022 10:30 AM 27 *  04/10/2020 10:00 AM 4 *  05/01/2019 10:00 AM 4 *  04/10/2019  8:00 AM 27 *    * Data saved with a previous flowsheet row definition    PHQ-2 Score:    PHQ-9 PHQ-9 Total Score  07/30/2023 10:30 AM 2  03/08/2023 10:45 AM 10 *  01/25/2023 11:15 AM 22 *  12/23/2022 10:30 AM 27 *  04/10/2020 10:00 AM 4 *    * Data saved with a previous flowsheet row definition    GAD-7 Score:    Physical Exam General: alert, not somnolent Lungs: normal work of breathing MSK: right knee without erythema, warmth    Results DIAGNOSTIC Knee aspiration: Fluid aspirated from knee (09/14/2023)  PROCEDURE Steroid injection administered into the knee joint (09/14/2023)   *Some images could not be shown.

## 2023-10-06 ENCOUNTER — Ambulatory Visit (INDEPENDENT_AMBULATORY_CARE_PROVIDER_SITE_OTHER): Admitting: Podiatry

## 2023-10-06 ENCOUNTER — Encounter: Payer: Self-pay | Admitting: Podiatry

## 2023-10-06 VITALS — Ht 63.0 in | Wt 277.3 lb

## 2023-10-06 DIAGNOSIS — B351 Tinea unguium: Secondary | ICD-10-CM | POA: Diagnosis not present

## 2023-10-06 DIAGNOSIS — M79675 Pain in left toe(s): Secondary | ICD-10-CM | POA: Diagnosis not present

## 2023-10-06 DIAGNOSIS — M79674 Pain in right toe(s): Secondary | ICD-10-CM | POA: Diagnosis not present

## 2023-10-06 DIAGNOSIS — L600 Ingrowing nail: Secondary | ICD-10-CM

## 2023-10-06 NOTE — Progress Notes (Signed)
   Chief Complaint  Patient presents with   Peripheral Neuropathy    Pt is here due to bilateral foot neuropathy, states she is not a diabetic, has numbness to both feet, wants to set up care with a podiatry.    Subjective: 58 year old female minimally ambulatory in a wheelchair presents today for evaluation of pain to the bilateral lower extremities.  She also reports that she has had a chronic ingrown toenail to the medial border of the right great toe intermittently for about 1 year now.  It is very sensitive to touch. Patient is concerned for possible ingrown nail.  It is very sensitive to touch.  Patient presents today for further treatment and evaluation.  Past Medical History:  Diagnosis Date   Asthma    Depression    Heart murmur    Myasthenia gravis (HCC)    Obesity    Pulmonary embolism (HCC)    Sleep apnea    Thyroid  disease     Past Surgical History:  Procedure Laterality Date   BREAST SURGERY  06/2019   breast reduction   THYROID  SURGERY     TONSILLECTOMY     TUBAL LIGATION      Allergies  Allergen Reactions   Magnesium  Sulfate Other (See Comments)    Myasthenia gravis. Consider withholding replacement unless severely low.     Objective:  General: Well developed, nourished, in no acute distress, alert and oriented x3   Dermatology: Skin is warm, dry and supple bilateral.  Medial border right great toe is tender with evidence of an ingrowing nail. Pain on palpation noted to the border of the nail fold.  Remaining nails demonstrate hyperkeratotic elongation of the toenails bilateral  Vascular: Heavy chronic edema noted bilateral lower extremities secondary to being mostly nonambulatory in a wheelchair  Neruologic: Grossly intact via light touch bilateral.  Musculoskeletal: No pedal deformity noted.  Minimally ambulatory mostly in a wheelchair  Assesement: #1 Paronychia with ingrowing nail medial border right great toe #2 pain due to onychomycosis of  toenails bilateral #3 chronic lower extremity edema  Plan of Care:  -Patient evaluated.  -Discussed treatment alternatives and plan of care. Explained nail avulsion procedure and post procedure course to patient. -Patient opted for permanent partial nail avulsion of the ingrown portion of the nail.  -Prior to procedure, local anesthesia infiltration utilized using 3 ml of a 50:50 mixture of 2% plain lidocaine  and 0.5% plain marcaine  in a normal hallux block fashion and a betadine prep performed.  -Partial permanent nail avulsion with chemical matrixectomy performed using 3x30sec applications of phenol followed by alcohol flush.  -Light dressing applied.  Post care instructions provided -Mechanical debridement of remaining nails 1-5 bilateral was performed using a nail nipper without incident or bleeding -Return to clinic 3 weeks  Thresa EMERSON Sar, DPM [facility]  Dr. Thresa EMERSON Sar, DPM    [address] [place], [place] [address]                Office [phone]  Fax [phone]

## 2023-10-12 ENCOUNTER — Other Ambulatory Visit (HOSPITAL_COMMUNITY)
Admission: RE | Admit: 2023-10-12 | Discharge: 2023-10-12 | Disposition: A | Discharge status: Home / Self Care | Source: Ambulatory Visit | Attending: Obstetrics and Gynecology | Admitting: Obstetrics and Gynecology

## 2023-10-12 ENCOUNTER — Encounter: Payer: Self-pay | Admitting: Obstetrics and Gynecology

## 2023-10-12 ENCOUNTER — Other Ambulatory Visit: Payer: Self-pay

## 2023-10-12 ENCOUNTER — Ambulatory Visit (INDEPENDENT_AMBULATORY_CARE_PROVIDER_SITE_OTHER): Admitting: Obstetrics and Gynecology

## 2023-10-12 VITALS — BP 111/70 | HR 66

## 2023-10-12 DIAGNOSIS — N950 Postmenopausal bleeding: Secondary | ICD-10-CM | POA: Diagnosis not present

## 2023-10-12 DIAGNOSIS — Z1331 Encounter for screening for depression: Secondary | ICD-10-CM | POA: Diagnosis not present

## 2023-10-12 NOTE — Progress Notes (Unsigned)
 NEW GYNECOLOGY PATIENT Patient name: [name] MRN [id number]  Date of birth: [birth year] Chief Complaint:   No chief complaint on file.     History:  Presents with PMB. PMB started during hospital admission and has not stopped. Will pass clots and bleeding is bright red. Not currently on blood thinner. Occasional discomfort with the bleeding. This is the first time having bleeding since menopause. Not currently taking megace /medication to stop bleeding. Last pap normal that she is aware of.       Gynecologic History No LMP recorded. Patient is postmenopausal. Contraception: post menopausal status Pap 2022 NILM, HPV negative   OB History  Gravida Para Term Preterm AB Living  0 0 0 0 0 0  SAB IAB Ectopic Multiple Live Births  0 0 0 0 0     The following portions of the patient's history were reviewed and updated as appropriate: allergies, current medications, past family history, past medical history, past social history, past surgical history and problem list. Health Maintenance  Topic Date Due   Medicare Annual Wellness Visit  Never done   Hepatitis C Screening  Never done   DTaP/Tdap/Td vaccine (1 - Tdap) Never done   Hepatitis B Vaccine (1 of 3 - 19+ 3-dose series) Never done   Zoster (Shingles) Vaccine (1 of 2) Never done   Breast Cancer Screening  Never done   Colon Cancer Screening  Never done   COVID-19 Vaccine (5 - Mixed Product risk 2024-25 season) 09/20/2023   Pap with HPV screening  06/10/2025   Pneumococcal Vaccine for age over 26  Completed   Flu Shot  Completed   HIV Screening  Completed   HPV Vaccine  Aged Out   Meningitis B Vaccine  Aged Out     Review of Systems Pertinent items noted in HPI and remainder of comprehensive ROS otherwise negative.  Physical Exam:  BP 111/70   Pulse 66  Physical Exam Vitals and nursing note reviewed. Exam conducted with a chaperone present.  Constitutional:      Appearance: Normal appearance.  Pulmonary:      Effort: Pulmonary effort is normal.  Abdominal:     Palpations: Abdomen is soft.  Genitourinary:    General: Normal vulva.     Exam position: Lithotomy position.     Comments: Nulliparous cervix Bright red blood noted at vaginal introitus Neurological:     Mental Status: She is alert.    Endometrial Biopsy Procedure  Patient identified, informed consent performed,  indication reviewed, consent signed.  Reviewed risk of perforation, pain, bleeding, insufficient sample, etc were reviewd. Time out was performed.  Urine pregnancy test negative.  Speculum placed in the vagina.  Cervix visualized.  Cleaned with Betadine x 2.  Cervix very anterior and difficult to visualize due to menopausal status and patient discomfort. Paracervical block was not administered.  Endometrial pipelle was used to draw up 1cc of 1% lidocaine , introduced into the cervical os and instilled into the endometrial cavity.  The pipelle was passed once without difficulty and sample obtained. Attempted to pass pipelle again without use of tenaculum but increasing patient discomfort and decreasing visualization, procedure was concluded.  Patient tolerated procedure well.  Patient was given post-procedure instructions.     Assessment and Plan:   1. Postmenopausal bleeding (Primary) Now s/p uncomplicated EMB, will follow up surgical pathology. Discussed that if insufficient sample, options thereafter include re-sampling in office vs operating room. Patient would be high risk anesthetic candidate, would be  most optimal to repeat in office. Megace  sent to pharamcy on file for PMB.  - Surgical pathology      Follow-up: No follow-ups on file.      Carter Quarry, MD Obstetrician & Gynecologist, Faculty Practice [facility] for [employer], [facility]

## 2023-10-12 NOTE — Telephone Encounter (Signed)
 Can you return call and let them know that I'd like to add home health skilled nursing for wound care. Eval and treat. And give permission for verbal order as doumented.

## 2023-10-13 ENCOUNTER — Telehealth: Payer: Self-pay

## 2023-10-13 MED ORDER — MEGESTROL ACETATE 40 MG PO TABS: 40.0000 mg | ORAL_TABLET | Freq: Two times a day (BID) | ORAL | 5 refills | Status: AC

## 2023-10-13 NOTE — Telephone Encounter (Signed)
 Called patient per Dr. Jeralyn request and confirmed identify 2x. Spoke with sister Allyanna Appleman who is listed on DPR form.  Informed her that the provider has sent in megace  to her pharmacy on file to help with her bleeding. Sister stated that the bleeding has slowed down but they will still pick up medication from pharmacy. Confirmed pharmacy on file. Patient has no further questions or concerns.  B'Aisha, RMA

## 2023-10-13 NOTE — Progress Notes (Signed)
 Opened in error

## 2023-10-15 LAB — SURGICAL PATHOLOGY

## 2023-10-18 ENCOUNTER — Ambulatory Visit (HOSPITAL_BASED_OUTPATIENT_CLINIC_OR_DEPARTMENT_OTHER): Payer: Self-pay | Admitting: Obstetrics & Gynecology

## 2023-10-20 NOTE — Telephone Encounter (Addendum)
 RN contacted pt to discuss negative EMB results and recommendation to schedule follow up visit with Dr. Jeralyn in 4-6 weeks.  Pt verbalized understanding and had no further questions at this time.  Message sent to front staff for scheduling.   Waddell, RN   ----- Message from Ronal GORMAN Pinal sent at 10/18/2023 10:00 PM EDT ----- Please let pt know her endometrial biopsy was negative for abnormal cells.  She was given megace  so should be taking it.  Needs follow 4-6 weeks with Dr. Jeralyn.    Dr. Pinal, covering provider ----- Message ----- From: Interface, Lab In Three Zero Seven Sent: 10/15/2023   1:46 PM EDT To: Carter Jeralyn, MD

## 2023-10-27 ENCOUNTER — Ambulatory Visit (INDEPENDENT_AMBULATORY_CARE_PROVIDER_SITE_OTHER): Admitting: Podiatry

## 2023-10-27 ENCOUNTER — Encounter: Payer: Self-pay | Admitting: Podiatry

## 2023-10-27 VITALS — Ht 63.0 in | Wt 277.3 lb

## 2023-10-27 DIAGNOSIS — L600 Ingrowing nail: Secondary | ICD-10-CM | POA: Diagnosis not present

## 2023-10-27 NOTE — Progress Notes (Signed)
   Chief Complaint  Patient presents with   Ingrown Toenail    Pt is here to f/u on right toenail after having ingrown removed, states there is still some discharge from the toe, caregiver continue to keep area clean and applies antibiotic cream with ban aid.    Subjective: 58 y.o. female presents today status post permanent nail avulsion procedure of the medial border right great toe that was performed on 10/06/2023.  Doing well.  They have been soaking the toe and applying antibiotic ointment as instructed.   Past Medical History:  Diagnosis Date   Asthma    Depression    Heart murmur    Myasthenia gravis (HCC)    Obesity    Pulmonary embolism (HCC)    Sleep apnea    Thyroid  disease     Objective: Neurovascular status intact.  Skin is warm, dry and supple. Nail and respective nail fold appears to be healing appropriately.   Assessment: #1 s/p partial permanent nail matrixectomy medial border right great toe   Plan of care: #1 patient was evaluated  #2 light debridement of the periungual debris was performed to the border of the respective toe and nail plate using a tissue nipper. #3 patient is to return to clinic on a PRN basis.   Thresa EMERSON Sar, DPM [facility]  Dr. Thresa EMERSON Sar, DPM    [address] [place], [place] [address]                Office [phone]  Fax [phone]

## 2023-11-01 ENCOUNTER — Ambulatory Visit: Admitting: Orthopedic Surgery

## 2023-11-01 DIAGNOSIS — M25562 Pain in left knee: Secondary | ICD-10-CM | POA: Diagnosis not present

## 2023-11-01 DIAGNOSIS — M25561 Pain in right knee: Secondary | ICD-10-CM | POA: Diagnosis not present

## 2023-11-01 DIAGNOSIS — G8929 Other chronic pain: Secondary | ICD-10-CM | POA: Diagnosis not present

## 2023-11-02 NOTE — Progress Notes (Signed)
 Office Visit Note   Patient: [name]           Date of Birth: 1965-02-08           MRN: [id number] Visit Date: 11/01/2023              Requested by: Barbara Delon Jansky, MD [address] [place],  [place] [address] PCP: Barbara Delon Jansky, MD  Chief Complaint  Patient presents with   Right Knee - Pain      HPI: Discussed the use of AI scribe software for clinical note transcription with the patient, who gave verbal consent to proceed.  History of Present Illness [name] is a 58 year old female who presents for follow-up regarding her knee pain and swelling.  Her knee pain has improved since her recent hospitalization, and she currently has no knee pain. She denies having any current questions or concerns about her knee.  She experiences swelling in her foot, which makes it difficult to wear compression socks. She has tried using compression socks but found them too small due to the swelling. She elevates her feet while sleeping to help manage the swelling.  She is trying to incorporate more walking into her routine to help with her condition.     Assessment & Plan: Visit Diagnoses:  1. Chronic pain of both knees     Plan: Assessment and Plan Assessment & Plan Right knee osteoarthritis Tricompartmental arthritic changes with medial joint space narrowing. Currently asymptomatic but has arthritic pain. - Consider cortisone injections if symptoms recur.  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 No arterial insufficiency. Swelling due to fluid leakage managed by lymphatic system. - Encourage regular exercise for lymphatic drainage. - Advise leg elevation to reduce swelling. - Recommend appropriately sized compression socks.      Follow-Up Instructions: No follow-ups on file.   Ortho Exam  Patient is alert, oriented, no adenopathy, well-dressed,  normal affect, normal respiratory effort. Physical Exam CARDIOVASCULAR: Dorsalis pedis pulse palpable bilaterally, no arterial insufficiency. EXTREMITIES: Venous and lymphatic insufficiency with pitting edema of calf and thigh bilaterally.  Crepitation with range of motion of both knees with no effusion      Imaging: No results found. No images are attached to the encounter.  Labs: Lab Results  Component Value Date   HGBA1C 6.0 (H) 08/16/2016   REPTSTATUS 08/23/2023 FINAL 08/18/2023   CULT  08/18/2023    NO GROWTH 5 DAYS Performed at [facility], [address] [place], [place] [address]      Lab Results  Component Value Date   ALBUMIN 2.9 (L) 09/08/2023   ALBUMIN 2.6 (L) 08/31/2023   ALBUMIN 2.9 (L) 08/25/2023    Lab Results  Component Value Date   MG 2.3 08/31/2023   MG 1.7 08/28/2023   MG 1.7 08/27/2023   No results found for: VD25OH  No results found for: PREALBUMIN    Latest Ref Rng & Units 09/21/2023    5:17 AM 09/19/2023   10:33 AM 09/16/2023    5:19 AM  CBC EXTENDED  WBC 4.0 - 10.5 K/uL 6.8  9.6  8.8   RBC 3.87 - 5.11 MIL/uL 3.26  3.25  3.44   Hemoglobin 12.0 - 15.0 g/dL 9.5  9.4  89.8   HCT 63.9 - 46.0 % 31.6  32.3  33.5   Platelets 150 - 400 K/uL 146  150  168  NEUT# 1.7 - 7.7 K/uL 4.5     Lymph# 0.7 - 4.0 K/uL 1.4        There is no height or weight on file to calculate BMI.  Orders:  No orders of the defined types were placed in this encounter.  No orders of the defined types were placed in this encounter.    Procedures: No procedures performed  Clinical Data: No additional findings.  ROS:  All other systems negative, except as noted in the HPI. Review of Systems  Objective: Vital Signs: There were no vitals taken for this visit.  Specialty Comments:  No specialty comments available.  PMFS History: Patient Active Problem List   Diagnosis Date Noted   Leukocytosis 09/15/2023   Acute respiratory failure with  hypoxia and hypercapnia (HCC) 08/25/2023   Sepsis due to pneumonia (HCC) 08/18/2023   Acute on chronic respiratory failure with hypoxia and hypercapnia (HCC) 08/18/2023   Elevated troponin 08/18/2023   Thrombocytopenia 08/18/2023   Moderate persistent asthma 08/18/2023   Splenic laceration, initial encounter 08/18/2023   History of pulmonary embolus (PE) 11/21/2018   Pulmonary emboli (HCC) 08/19/2016   Asthma exacerbation 08/16/2016   Depression 06/20/2014   Anxiety 06/20/2014   Sleep apnea 06/20/2014   GERD (gastroesophageal reflux disease) 12/28/2012   Hypothyroidism 12/31/1999   Morbid obesity (HCC) 03/02/1994   Myasthenia gravis (HCC) 09/10/1992   Past Medical History:  Diagnosis Date   Asthma    Depression    Heart murmur    Myasthenia gravis (HCC)    Obesity    Pulmonary embolism (HCC)    Sleep apnea    Thyroid  disease     No family history on file.  Past Surgical History:  Procedure Laterality Date   BREAST SURGERY  06/2019   breast reduction   THYROID  SURGERY     TONSILLECTOMY     TUBAL LIGATION     Social History   Occupational History   Not on file  Tobacco Use   Smoking status: Former   Smokeless tobacco: Never  Substance and Sexual Activity   Alcohol use: Yes   Drug use: Never   Sexual activity: Not Currently

## 2023-11-22 ENCOUNTER — Encounter: Payer: Self-pay | Admitting: Radiology

## 2023-11-24 NOTE — Telephone Encounter (Signed)
 Copied from CRM #[id number]. Topic: Access To Clinicians - Req Clinic Call Back  >> Nov 24, 2023  4:41 PM Hargis HERO wrote:  Clinical Advice: Monitoring Advice: [name] is calling to request home health verbal order for PT and re-certification     1 time per week for 8 week        [name] preferred contact: Cell Phone Telephone Information:  Mobile         [phone]   Routine callback turnaround time: 24-48 business hours. Programmer, Systems Notified)

## 2023-11-25 ENCOUNTER — Ambulatory Visit: Admitting: Obstetrics and Gynecology

## 2023-11-25 ENCOUNTER — Other Ambulatory Visit: Payer: Self-pay

## 2023-11-25 ENCOUNTER — Encounter: Payer: Self-pay | Admitting: Obstetrics and Gynecology

## 2023-11-25 VITALS — BP 117/46 | HR 82

## 2023-11-25 DIAGNOSIS — N950 Postmenopausal bleeding: Secondary | ICD-10-CM

## 2023-11-25 DIAGNOSIS — Z1331 Encounter for screening for depression: Secondary | ICD-10-CM

## 2023-11-25 NOTE — Progress Notes (Signed)
    GYNECOLOGY VISIT  Patient name: [name] MRN [id number]  Date of birth: 03-Mar-1965 Chief Complaint:   Gynecologic Exam   History:  [name] her for follow up of AUB. Bleeding stopped after procedure. Not currently taking megace  and hasn't had any additional episodes of bleeding.   The following portions of the patient's history were reviewed and updated as appropriate: allergies, current medications, past family history, past medical history, past social history, past surgical history and problem list.   Health Maintenance:   Last pap No results found for: DIAGPAP, HPVHIGH, ADEQPAP  06/10/2020 NILM, HPV negative  Health Maintenance  Topic Date Due   Medicare Annual Wellness Visit  Never done   Hepatitis C Screening  Never done   DTaP/Tdap/Td vaccine (1 - Tdap) Never done   Hepatitis B Vaccine (1 of 3 - 19+ 3-dose series) Never done   Zoster (Shingles) Vaccine (1 of 2) Never done   Breast Cancer Screening  Never done   Colon Cancer Screening  Never done   COVID-19 Vaccine (6 - Mixed Product risk 2025-26 season) 05/18/2024   Pap with HPV screening  06/10/2025   Pneumococcal Vaccine for age over 89  Completed   Flu Shot  Completed   HIV Screening  Completed   HPV Vaccine  Aged Out   Meningitis B Vaccine  Aged Out     Review of Systems:  Pertinent items are noted in HPI. Comprehensive review of systems was otherwise negative.   Objective:  Physical Exam BP (!) 117/46   Pulse 82    Physical Exam Vitals and nursing note reviewed.  Constitutional:      Appearance: Normal appearance.  HENT:     Head: Normocephalic and atraumatic.  Pulmonary:     Effort: Pulmonary effort is normal.  Skin:    General: Skin is warm and dry.  Neurological:     General: No focal deficit present.     Mental Status: She is alert.  Psychiatric:        Mood and Affect: Mood normal.        Behavior: Behavior normal.        Thought Content: Thought content normal.         Judgment: Judgment normal.   FINAL MICROSCOPIC DIAGNOSIS:   A. ENDOMETRIUM, BIOPSY:  - Benign inactive endometrium  - Negative for hyperplasia or malignancy    Assessment & Plan:  1. Postmenopausal bleeding (Primary) Resolved for now. Benign pathology, likely secondary to anticoagulation. Will continue to monitor. Pap due 2027    Carter Quarry, MD [facility], [facility]

## 2023-11-25 NOTE — Telephone Encounter (Signed)
 Rx was sent over to pharmacy on 11/15/23.  Spoke with patient's sister who will reach out to pharmacy on her sister's behalf   Lab Results  Component Value Date   Hepatitis C Ab Nonreactive 07/03/2019     No results found for this or any previous visit.   No results found for this or any previous visit.   No results found for this or any previous visit.   No results found. However, due to the size of the patient record, not all encounters were searched. Please check Results Review for a complete set of results.

## 2024-02-28 ENCOUNTER — Ambulatory Visit: Admitting: Neurology
